# Patient Record
Sex: Male | Born: 1940
Health system: Southern US, Community
[De-identification: ages and names within clinical notes are randomized; demographics above are authoritative.]

## PROBLEM LIST (undated history)

## (undated) DIAGNOSIS — J189 Pneumonia, unspecified organism: Secondary | ICD-10-CM

## (undated) DIAGNOSIS — M1990 Unspecified osteoarthritis, unspecified site: Secondary | ICD-10-CM

## (undated) DIAGNOSIS — F419 Anxiety disorder, unspecified: Secondary | ICD-10-CM

## (undated) DIAGNOSIS — M7989 Other specified soft tissue disorders: Secondary | ICD-10-CM

## (undated) DIAGNOSIS — N400 Enlarged prostate without lower urinary tract symptoms: Secondary | ICD-10-CM

## (undated) DIAGNOSIS — H9190 Unspecified hearing loss, unspecified ear: Secondary | ICD-10-CM

## (undated) DIAGNOSIS — G44009 Cluster headache syndrome, unspecified, not intractable: Secondary | ICD-10-CM

## (undated) DIAGNOSIS — K2289 Other specified disease of esophagus: Secondary | ICD-10-CM

## (undated) DIAGNOSIS — K228 Other specified diseases of esophagus: Secondary | ICD-10-CM

## (undated) DIAGNOSIS — F329 Major depressive disorder, single episode, unspecified: Secondary | ICD-10-CM

## (undated) DIAGNOSIS — K529 Noninfective gastroenteritis and colitis, unspecified: Secondary | ICD-10-CM

## (undated) DIAGNOSIS — G629 Polyneuropathy, unspecified: Secondary | ICD-10-CM

## (undated) DIAGNOSIS — R351 Nocturia: Secondary | ICD-10-CM

## (undated) DIAGNOSIS — I2692 Saddle embolus of pulmonary artery without acute cor pulmonale: Secondary | ICD-10-CM

## (undated) DIAGNOSIS — I10 Essential (primary) hypertension: Secondary | ICD-10-CM

## (undated) DIAGNOSIS — F0390 Unspecified dementia without behavioral disturbance: Secondary | ICD-10-CM

## (undated) DIAGNOSIS — E785 Hyperlipidemia, unspecified: Secondary | ICD-10-CM

## (undated) DIAGNOSIS — G4700 Insomnia, unspecified: Secondary | ICD-10-CM

## (undated) DIAGNOSIS — F32A Depression, unspecified: Secondary | ICD-10-CM

## (undated) DIAGNOSIS — H269 Unspecified cataract: Secondary | ICD-10-CM

## (undated) DIAGNOSIS — M069 Rheumatoid arthritis, unspecified: Secondary | ICD-10-CM

## (undated) DIAGNOSIS — K219 Gastro-esophageal reflux disease without esophagitis: Secondary | ICD-10-CM

## (undated) DIAGNOSIS — G4730 Sleep apnea, unspecified: Secondary | ICD-10-CM

## (undated) DIAGNOSIS — K449 Diaphragmatic hernia without obstruction or gangrene: Secondary | ICD-10-CM

## (undated) DIAGNOSIS — R1310 Dysphagia, unspecified: Secondary | ICD-10-CM

## (undated) DIAGNOSIS — E0590 Thyrotoxicosis, unspecified without thyrotoxic crisis or storm: Secondary | ICD-10-CM

## (undated) DIAGNOSIS — R5383 Other fatigue: Secondary | ICD-10-CM

## (undated) HISTORY — DX: Other specified soft tissue disorders: M79.89

## (undated) HISTORY — DX: Anxiety disorder, unspecified: F41.9

## (undated) HISTORY — DX: Hyperlipidemia, unspecified: E78.5

## (undated) HISTORY — DX: Cluster headache syndrome, unspecified, not intractable: G44.009

## (undated) HISTORY — DX: Major depressive disorder, single episode, unspecified: F32.9

## (undated) HISTORY — DX: Benign prostatic hyperplasia without lower urinary tract symptoms: N40.0

## (undated) HISTORY — DX: Thyrotoxicosis, unspecified without thyrotoxic crisis or storm: E05.90

## (undated) HISTORY — DX: Gastro-esophageal reflux disease without esophagitis: K21.9

## (undated) HISTORY — DX: Unspecified hearing loss, unspecified ear: H91.90

## (undated) HISTORY — DX: Other fatigue: R53.83

## (undated) HISTORY — DX: Rheumatoid arthritis, unspecified: M06.9

## (undated) HISTORY — DX: Diaphragmatic hernia without obstruction or gangrene: K44.9

## (undated) HISTORY — DX: Essential (primary) hypertension: I10

## (undated) HISTORY — DX: Unspecified osteoarthritis, unspecified site: M19.90

## (undated) HISTORY — DX: Other specified disease of esophagus: K22.89

## (undated) HISTORY — PX: COLONOSCOPY WITH ESOPHAGOGASTRODUODENOSCOPY (EGD) AND ESOPHAGEAL DILATION (ED): SHX6495

## (undated) HISTORY — DX: Depression, unspecified: F32.A

## (undated) HISTORY — DX: Other specified diseases of esophagus: K22.8

---

## 1996-07-11 HISTORY — PX: BACK SURGERY: SHX140

## 1999-07-12 ENCOUNTER — Encounter: Payer: Self-pay | Admitting: Family Medicine

## 1999-08-17 ENCOUNTER — Encounter: Payer: Self-pay | Admitting: Internal Medicine

## 1999-08-17 ENCOUNTER — Ambulatory Visit (HOSPITAL_COMMUNITY): Admission: RE | Admit: 1999-08-17 | Discharge: 1999-08-17 | Payer: Self-pay | Admitting: Cardiology

## 2000-10-09 ENCOUNTER — Encounter: Payer: Self-pay | Admitting: Family Medicine

## 2000-10-09 LAB — CONVERTED CEMR LAB: Hgb A1c MFr Bld: 7.4 %

## 2001-01-22 ENCOUNTER — Encounter: Payer: Self-pay | Admitting: Emergency Medicine

## 2001-01-22 ENCOUNTER — Emergency Department (HOSPITAL_COMMUNITY): Admission: EM | Admit: 2001-01-22 | Discharge: 2001-01-22 | Payer: Self-pay | Admitting: Emergency Medicine

## 2001-02-08 ENCOUNTER — Encounter: Payer: Self-pay | Admitting: Family Medicine

## 2001-03-11 ENCOUNTER — Encounter: Payer: Self-pay | Admitting: Family Medicine

## 2001-10-09 ENCOUNTER — Encounter: Payer: Self-pay | Admitting: Family Medicine

## 2001-10-09 LAB — CONVERTED CEMR LAB
Hgb A1c MFr Bld: 5.9 %
PSA: 2.5 ng/mL

## 2001-12-20 ENCOUNTER — Encounter: Payer: Self-pay | Admitting: Internal Medicine

## 2001-12-20 ENCOUNTER — Ambulatory Visit (HOSPITAL_COMMUNITY): Admission: RE | Admit: 2001-12-20 | Discharge: 2001-12-20 | Payer: Self-pay | Admitting: Internal Medicine

## 2003-07-12 ENCOUNTER — Encounter: Payer: Self-pay | Admitting: Family Medicine

## 2003-07-12 LAB — CONVERTED CEMR LAB
Hgb A1c MFr Bld: 7.1 %
Hgb A1c MFr Bld: 7.1 %
PSA: 1.8 ng/mL
PSA: 1.8 ng/mL

## 2003-10-10 ENCOUNTER — Encounter: Payer: Self-pay | Admitting: Family Medicine

## 2003-10-10 LAB — CONVERTED CEMR LAB
Hgb A1c MFr Bld: 7 %
Hgb A1c MFr Bld: 7 %

## 2004-05-11 ENCOUNTER — Encounter: Payer: Self-pay | Admitting: Family Medicine

## 2004-05-11 LAB — CONVERTED CEMR LAB
Hgb A1c MFr Bld: 6.2 %
Hgb A1c MFr Bld: 6.2 %

## 2004-07-11 ENCOUNTER — Encounter: Payer: Self-pay | Admitting: Family Medicine

## 2004-07-11 LAB — CONVERTED CEMR LAB
Hgb A1c MFr Bld: 6.3 %
Hgb A1c MFr Bld: 6.3 %
Microalbumin U total vol: 3.8 mg/L
Microalbumin U total vol: 3.8 mg/L
PSA: 2.03 ng/mL
PSA: 2.03 ng/mL

## 2004-08-10 ENCOUNTER — Ambulatory Visit: Payer: Self-pay | Admitting: Family Medicine

## 2004-08-13 ENCOUNTER — Ambulatory Visit: Payer: Self-pay | Admitting: Family Medicine

## 2004-08-25 ENCOUNTER — Ambulatory Visit: Payer: Self-pay | Admitting: Internal Medicine

## 2004-08-26 ENCOUNTER — Ambulatory Visit: Payer: Self-pay | Admitting: Family Medicine

## 2004-08-30 ENCOUNTER — Ambulatory Visit: Payer: Self-pay | Admitting: Internal Medicine

## 2004-08-30 ENCOUNTER — Ambulatory Visit (HOSPITAL_COMMUNITY): Admission: RE | Admit: 2004-08-30 | Discharge: 2004-08-30 | Payer: Self-pay | Admitting: Internal Medicine

## 2004-10-01 ENCOUNTER — Ambulatory Visit: Payer: Self-pay | Admitting: Family Medicine

## 2004-11-24 ENCOUNTER — Ambulatory Visit: Payer: Self-pay | Admitting: Family Medicine

## 2004-12-30 ENCOUNTER — Ambulatory Visit: Payer: Self-pay | Admitting: Family Medicine

## 2005-04-01 ENCOUNTER — Ambulatory Visit: Payer: Self-pay | Admitting: Family Medicine

## 2005-05-06 ENCOUNTER — Ambulatory Visit: Payer: Self-pay | Admitting: Internal Medicine

## 2005-06-10 ENCOUNTER — Encounter: Payer: Self-pay | Admitting: Family Medicine

## 2005-06-10 LAB — CONVERTED CEMR LAB
Hgb A1c MFr Bld: 6.1 %
Hgb A1c MFr Bld: 6.1 %

## 2005-06-24 ENCOUNTER — Ambulatory Visit: Payer: Self-pay | Admitting: Family Medicine

## 2005-06-29 ENCOUNTER — Ambulatory Visit: Payer: Self-pay | Admitting: Family Medicine

## 2005-08-31 ENCOUNTER — Ambulatory Visit: Payer: Self-pay | Admitting: Family Medicine

## 2005-12-09 ENCOUNTER — Encounter: Payer: Self-pay | Admitting: Family Medicine

## 2005-12-09 LAB — CONVERTED CEMR LAB
Hgb A1c MFr Bld: 7 %
Hgb A1c MFr Bld: 7 %
Microalbumin U total vol: 4.4 mg/L
PSA: 2.1 ng/mL

## 2005-12-28 ENCOUNTER — Ambulatory Visit: Payer: Self-pay | Admitting: Family Medicine

## 2006-01-04 ENCOUNTER — Ambulatory Visit: Payer: Self-pay | Admitting: Family Medicine

## 2006-01-27 ENCOUNTER — Ambulatory Visit: Payer: Self-pay | Admitting: Family Medicine

## 2006-03-11 ENCOUNTER — Encounter: Payer: Self-pay | Admitting: Family Medicine

## 2006-04-05 ENCOUNTER — Ambulatory Visit: Payer: Self-pay | Admitting: Family Medicine

## 2006-04-05 LAB — CONVERTED CEMR LAB: Hgb A1c MFr Bld: 6.4 %

## 2006-04-07 ENCOUNTER — Ambulatory Visit: Payer: Self-pay | Admitting: Family Medicine

## 2006-05-16 ENCOUNTER — Ambulatory Visit: Payer: Self-pay | Admitting: Family Medicine

## 2006-06-10 ENCOUNTER — Encounter: Payer: Self-pay | Admitting: Family Medicine

## 2006-06-10 LAB — CONVERTED CEMR LAB: Hgb A1c MFr Bld: 6.6 %

## 2006-07-10 ENCOUNTER — Ambulatory Visit: Payer: Self-pay | Admitting: Family Medicine

## 2007-01-04 ENCOUNTER — Encounter: Payer: Self-pay | Admitting: Family Medicine

## 2007-01-04 DIAGNOSIS — K219 Gastro-esophageal reflux disease without esophagitis: Secondary | ICD-10-CM

## 2007-01-04 DIAGNOSIS — M1990 Unspecified osteoarthritis, unspecified site: Secondary | ICD-10-CM | POA: Insufficient documentation

## 2007-01-04 DIAGNOSIS — N400 Enlarged prostate without lower urinary tract symptoms: Secondary | ICD-10-CM | POA: Insufficient documentation

## 2007-01-04 DIAGNOSIS — E785 Hyperlipidemia, unspecified: Secondary | ICD-10-CM | POA: Insufficient documentation

## 2007-01-04 DIAGNOSIS — F528 Other sexual dysfunction not due to a substance or known physiological condition: Secondary | ICD-10-CM

## 2007-01-04 DIAGNOSIS — E119 Type 2 diabetes mellitus without complications: Secondary | ICD-10-CM | POA: Insufficient documentation

## 2007-01-04 DIAGNOSIS — F329 Major depressive disorder, single episode, unspecified: Secondary | ICD-10-CM

## 2007-01-04 DIAGNOSIS — I10 Essential (primary) hypertension: Secondary | ICD-10-CM | POA: Insufficient documentation

## 2007-01-04 DIAGNOSIS — E0590 Thyrotoxicosis, unspecified without thyrotoxic crisis or storm: Secondary | ICD-10-CM | POA: Insufficient documentation

## 2007-01-08 ENCOUNTER — Ambulatory Visit: Payer: Self-pay | Admitting: Family Medicine

## 2007-01-08 LAB — CONVERTED CEMR LAB
ALT: 30 units/L (ref 0–53)
Basophils Relative: 0.6 % (ref 0.0–1.0)
Bilirubin, Direct: 0.1 mg/dL (ref 0.0–0.3)
CO2: 30 meq/L (ref 19–32)
Calcium: 9.6 mg/dL (ref 8.4–10.5)
Creatinine,U: 96.6 mg/dL
Eosinophils Relative: 4.8 % (ref 0.0–5.0)
GFR calc Af Amer: 96 mL/min
Glucose, Bld: 130 mg/dL — ABNORMAL HIGH (ref 70–99)
Hemoglobin: 13.8 g/dL (ref 13.0–17.0)
Lymphocytes Relative: 33.2 % (ref 12.0–46.0)
Microalb Creat Ratio: 5.2 mg/g (ref 0.0–30.0)
Monocytes Absolute: 0.4 10*3/uL (ref 0.2–0.7)
Neutro Abs: 2.9 10*3/uL (ref 1.4–7.7)
Neutrophils Relative %: 53.3 % (ref 43.0–77.0)
Potassium: 4.6 meq/L (ref 3.5–5.1)
Sodium: 142 meq/L (ref 135–145)
Total Protein: 7.7 g/dL (ref 6.0–8.3)
WBC: 5.2 10*3/uL (ref 4.5–10.5)

## 2007-01-10 ENCOUNTER — Ambulatory Visit: Payer: Self-pay | Admitting: Family Medicine

## 2007-02-08 ENCOUNTER — Encounter (INDEPENDENT_AMBULATORY_CARE_PROVIDER_SITE_OTHER): Payer: Self-pay | Admitting: *Deleted

## 2007-02-08 ENCOUNTER — Ambulatory Visit: Payer: Self-pay | Admitting: Family Medicine

## 2007-05-04 ENCOUNTER — Ambulatory Visit: Payer: Self-pay | Admitting: Family Medicine

## 2007-07-17 ENCOUNTER — Ambulatory Visit: Payer: Self-pay | Admitting: Family Medicine

## 2007-07-17 LAB — CONVERTED CEMR LAB: Hgb A1c MFr Bld: 6.8 % — ABNORMAL HIGH (ref 4.6–6.0)

## 2007-07-19 ENCOUNTER — Ambulatory Visit: Payer: Self-pay | Admitting: Family Medicine

## 2007-07-19 DIAGNOSIS — R945 Abnormal results of liver function studies: Secondary | ICD-10-CM

## 2007-08-24 ENCOUNTER — Ambulatory Visit: Payer: Self-pay | Admitting: Family Medicine

## 2007-08-26 LAB — CONVERTED CEMR LAB
ALT: 29 units/L (ref 0–53)
AST: 31 units/L (ref 0–37)

## 2007-08-31 ENCOUNTER — Ambulatory Visit: Payer: Self-pay | Admitting: Family Medicine

## 2008-01-14 ENCOUNTER — Telehealth: Payer: Self-pay | Admitting: Family Medicine

## 2008-01-24 ENCOUNTER — Ambulatory Visit: Payer: Self-pay | Admitting: Family Medicine

## 2008-01-24 LAB — CONVERTED CEMR LAB
BUN: 18 mg/dL (ref 6–23)
Calcium: 9.7 mg/dL (ref 8.4–10.5)
Creatinine, Ser: 1.1 mg/dL (ref 0.4–1.5)
Eosinophils Relative: 4.4 % (ref 0.0–5.0)
GFR calc Af Amer: 86 mL/min
Glucose, Bld: 127 mg/dL — ABNORMAL HIGH (ref 70–99)
HCT: 41.4 % (ref 39.0–52.0)
Hemoglobin: 13.8 g/dL (ref 13.0–17.0)
Monocytes Absolute: 0.4 10*3/uL (ref 0.1–1.0)
Monocytes Relative: 9.2 % (ref 3.0–12.0)
Neutro Abs: 2.4 10*3/uL (ref 1.4–7.7)
PSA: 2.13 ng/mL (ref 0.10–4.00)
RDW: 15.1 % — ABNORMAL HIGH (ref 11.5–14.6)
TSH: 0.53 microintl units/mL (ref 0.35–5.50)
Total CHOL/HDL Ratio: 4.1
Triglycerides: 172 mg/dL — ABNORMAL HIGH (ref 0–149)
WBC: 4.5 10*3/uL (ref 4.5–10.5)

## 2008-01-28 ENCOUNTER — Ambulatory Visit: Payer: Self-pay | Admitting: Family Medicine

## 2008-03-27 ENCOUNTER — Ambulatory Visit: Payer: Self-pay | Admitting: Family Medicine

## 2008-03-27 LAB — CONVERTED CEMR LAB
OCCULT 2: NEGATIVE
OCCULT 3: NEGATIVE

## 2008-03-31 ENCOUNTER — Encounter (INDEPENDENT_AMBULATORY_CARE_PROVIDER_SITE_OTHER): Payer: Self-pay | Admitting: *Deleted

## 2008-05-08 ENCOUNTER — Ambulatory Visit: Payer: Self-pay | Admitting: Family Medicine

## 2008-05-10 ENCOUNTER — Encounter: Payer: Self-pay | Admitting: Family Medicine

## 2008-07-15 ENCOUNTER — Telehealth: Payer: Self-pay | Admitting: Family Medicine

## 2008-07-30 ENCOUNTER — Ambulatory Visit: Payer: Self-pay | Admitting: Family Medicine

## 2008-07-30 LAB — CONVERTED CEMR LAB
AST: 37 units/L (ref 0–37)
Albumin: 4.1 g/dL (ref 3.5–5.2)
Hgb A1c MFr Bld: 6.9 % — ABNORMAL HIGH (ref 4.6–6.0)

## 2008-08-04 ENCOUNTER — Ambulatory Visit: Payer: Self-pay | Admitting: Family Medicine

## 2008-08-14 ENCOUNTER — Telehealth: Payer: Self-pay | Admitting: Family Medicine

## 2009-01-13 ENCOUNTER — Telehealth: Payer: Self-pay | Admitting: Family Medicine

## 2009-01-29 ENCOUNTER — Ambulatory Visit: Payer: Self-pay | Admitting: Family Medicine

## 2009-01-29 LAB — CONVERTED CEMR LAB
Albumin: 3.9 g/dL (ref 3.5–5.2)
Alkaline Phosphatase: 49 units/L (ref 39–117)
Basophils Absolute: 0 10*3/uL (ref 0.0–0.1)
CO2: 30 meq/L (ref 19–32)
Calcium: 9.4 mg/dL (ref 8.4–10.5)
Chloride: 110 meq/L (ref 96–112)
Creatinine,U: 101.4 mg/dL
Eosinophils Absolute: 0.2 10*3/uL (ref 0.0–0.7)
Glucose, Bld: 126 mg/dL — ABNORMAL HIGH (ref 70–99)
HDL: 39.3 mg/dL (ref >39.00)
Hemoglobin: 13.2 g/dL (ref 13.0–17.0)
Lymphocytes Relative: 29.2 % (ref 12.0–46.0)
Lymphs Abs: 1.3 10*3/uL (ref 0.7–4.0)
MCHC: 32.9 g/dL (ref 30.0–36.0)
MCV: 93.4 fL (ref 78.0–100.0)
Magnesium: 2.3 mg/dL (ref 1.5–2.5)
Microalb, Ur: 0.9 mg/dL (ref 0.0–1.9)
Monocytes Absolute: 0.4 10*3/uL (ref 0.1–1.0)
Neutro Abs: 2.5 10*3/uL (ref 1.4–7.7)
RDW: 14.5 % (ref 11.5–14.6)
Sodium: 145 meq/L (ref 135–145)
TSH: 0.53 microintl units/mL (ref 0.35–5.50)
Total Protein: 7.4 g/dL (ref 6.0–8.3)
Triglycerides: 148 mg/dL (ref 0.0–149.0)

## 2009-01-30 LAB — CONVERTED CEMR LAB: Vit D, 25-Hydroxy: 14 ng/mL — ABNORMAL LOW (ref 30–89)

## 2009-02-03 ENCOUNTER — Ambulatory Visit: Payer: Self-pay | Admitting: Family Medicine

## 2009-02-03 DIAGNOSIS — E559 Vitamin D deficiency, unspecified: Secondary | ICD-10-CM | POA: Insufficient documentation

## 2009-02-18 ENCOUNTER — Ambulatory Visit: Payer: Self-pay | Admitting: Family Medicine

## 2009-02-18 ENCOUNTER — Encounter (INDEPENDENT_AMBULATORY_CARE_PROVIDER_SITE_OTHER): Payer: Self-pay | Admitting: *Deleted

## 2009-02-18 LAB — CONVERTED CEMR LAB
OCCULT 1: NEGATIVE
OCCULT 2: NEGATIVE
OCCULT 3: NEGATIVE

## 2009-05-01 ENCOUNTER — Ambulatory Visit: Payer: Self-pay | Admitting: Family Medicine

## 2009-05-03 LAB — CONVERTED CEMR LAB: Vit D, 25-Hydroxy: 35 ng/mL (ref 30–89)

## 2009-05-07 ENCOUNTER — Ambulatory Visit: Payer: Self-pay | Admitting: Family Medicine

## 2009-05-07 DIAGNOSIS — R519 Headache, unspecified: Secondary | ICD-10-CM | POA: Insufficient documentation

## 2009-05-07 DIAGNOSIS — R51 Headache: Secondary | ICD-10-CM

## 2009-07-15 ENCOUNTER — Telehealth: Payer: Self-pay | Admitting: Family Medicine

## 2009-07-29 ENCOUNTER — Ambulatory Visit: Payer: Self-pay | Admitting: Family Medicine

## 2009-07-29 LAB — CONVERTED CEMR LAB: Hgb A1c MFr Bld: 7.1 % — ABNORMAL HIGH (ref 4.6–6.5)

## 2009-08-05 ENCOUNTER — Ambulatory Visit: Payer: Self-pay | Admitting: Family Medicine

## 2009-11-03 ENCOUNTER — Ambulatory Visit: Payer: Self-pay | Admitting: Family Medicine

## 2009-11-05 ENCOUNTER — Ambulatory Visit: Payer: Self-pay | Admitting: Family Medicine

## 2009-12-12 ENCOUNTER — Encounter: Payer: Self-pay | Admitting: Family Medicine

## 2009-12-30 ENCOUNTER — Ambulatory Visit: Payer: Self-pay | Admitting: Family Medicine

## 2010-01-05 ENCOUNTER — Ambulatory Visit: Payer: Self-pay | Admitting: Family Medicine

## 2010-01-05 DIAGNOSIS — E1149 Type 2 diabetes mellitus with other diabetic neurological complication: Secondary | ICD-10-CM

## 2010-01-05 DIAGNOSIS — M25579 Pain in unspecified ankle and joints of unspecified foot: Secondary | ICD-10-CM | POA: Insufficient documentation

## 2010-01-12 ENCOUNTER — Telehealth: Payer: Self-pay | Admitting: Family Medicine

## 2010-01-17 ENCOUNTER — Encounter: Payer: Self-pay | Admitting: Family Medicine

## 2010-01-18 ENCOUNTER — Encounter: Payer: Self-pay | Admitting: Family Medicine

## 2010-01-18 ENCOUNTER — Telehealth: Payer: Self-pay | Admitting: Family Medicine

## 2010-01-26 ENCOUNTER — Telehealth: Payer: Self-pay | Admitting: Family Medicine

## 2010-01-28 ENCOUNTER — Encounter: Payer: Self-pay | Admitting: Family Medicine

## 2010-02-16 ENCOUNTER — Encounter (INDEPENDENT_AMBULATORY_CARE_PROVIDER_SITE_OTHER): Payer: Self-pay | Admitting: *Deleted

## 2010-08-12 NOTE — Letter (Signed)
Summary: [facility],Note  Naheed Kassam,[facility],Note   Imported By: Beau Fanny 12/14/2009 13:41:50  _____________________________________________________________________  External Attachment:    Type:   Image     Comment:   External Document  Appended Document: [facility],Note     Clinical Lists Changes  Observations: Added new observation of DMEYEEXAMNXT: 12/2010 (12/14/2009 18:18) Added new observation of MONOFILAM RT: normal (12/14/2009 18:18) Added new observation of MONOFILAM LF: normal (12/14/2009 18:18) Added new observation of DIAB FOOT CK: yes (12/14/2009 18:18) Added new observation of DMEYEEXMRES: normal (12/12/2009 18:20) Added new observation of EYE EXAM BY: Dr Alfonse Ras (12/12/2009 18:20) Added new observation of DIAB EYE EX: normal (12/12/2009 18:20)          Diabetes Management Exam:    Foot Exam (with socks and/or shoes not present):       Sensory-Pinprick/Light touch:          Left medial foot (L-4): normal          Left dorsal foot (L-5): normal          Left lateral foot (S-1): normal          Right medial foot (L-4): normal          Right dorsal foot (L-5): normal          Right lateral foot (S-1): normal       Sensory-Monofilament:          Left foot: normal          Right foot: normal       Inspection:          Left foot: normal          Right foot: normal       Nails:          Left foot: normal          Right foot: normal    Eye Exam:       Eye Exam done elsewhere          Date: 12/12/2009          Results: normal          Done by: Dr Alfonse Ras
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
## 2010-08-12 NOTE — Progress Notes (Signed)
Summary: Form for diabetic shoes  Phone Note Call from Patient Call back at Home Phone [phone]   Summary of Call: Pt has dropped off a form for diabetic shoes, this form is on your desk.  Please call pt when ready. Initial call taken by: Lowella Petties CMA,  January 18, 2010 11:42 AM  Follow-up for Phone Call        signed, thanks.  Follow-up by: Crawford Givens MD,  January 18, 2010 1:53 PM  Additional Follow-up for Phone Call Additional follow up Details #1::        Faxed Additional Follow-up by: Delilah Shan CMA (AAMA),  January 18, 2010 3:15 PM

## 2010-08-12 NOTE — Progress Notes (Signed)
Summary: Rx Alprazolam  Phone Note Refill Request Call back at [phone] Message from:  [facility]  Refills Requested: Medication #1:  XANAX 0.25 MG TABS 1 by mouth two times a day   Last Refilled: 12/08/2009 Patient has an appointment scheduled with Dr. Sharen Hones 04/08/10.   Method Requested: Telephone to Pharmacy Initial call taken by: Sydell Axon LPN,  January 12, 1190 8:15 AM  Follow-up for Phone Call        Rx called to pharmacy Follow-up by: Linde Gillis CMA Duncan Dull),  January 12, 2010 10:05 AM    Prescriptions: XANAX 0.25 MG TABS (ALPRAZOLAM) 1 by mouth two times a day  #60 x 1   Entered and Authorized by:   Ruthe Mannan MD   Signed by:   Ruthe Mannan MD on 01/12/2010   Method used:   Telephoned to ...       [facility] [address] (retail)       [address], [address]       [place], [place]  [address]       Ph: [phone]       Fax: [phone]   RxID:   [id number]

## 2010-08-12 NOTE — Assessment & Plan Note (Signed)
Summary: 3 M F/U DLO   Vital Signs:  Patient profile:   70 year old male Weight:      258 pounds Temp:     98.7 degrees F oral Pulse rate:   60 / minute Pulse rhythm:   regular BP sitting:   112 / 70  (left arm) Cuff size:   large  Vitals Entered By: Sydell Axon LPN (August 05, 2009 3:28 PM) CC: 3 Month follow-up   History of Present Illness: Pt here with his wife for followup[ of Diabetes and Vit D deficiaency. He has fallen off the wagon some with his sugar through the holidays and has been taking VIt D once a day. He has continued to have headaches but he only eats once a day in the eveniing and seems to trigger his headaches about that time.In addition, he love rice and eats a lot of that.  Problems Prior to Update: 1)  Headache  (ICD-784.0) 2)  Unspecified Vitamin D Deficiency  (ICD-268.9) 3)  Liver Function Tests, Abnormal  (ICD-794.8) 4)  Climacteric Arthritis Involving L Ankle and Foot  (ICD-716.37) 5)  Screening For Malignannt Neoplasm, Site Nec  (ICD-V76.49) 6)  Erectile Dysfunction  (ICD-302.72) 7)  Benign Prostatic Hypertrophy  (ICD-600.00) 8)  Osteoarthritis  (ICD-715.90) 9)  Hyperthyroidism  (ICD-242.90) 10)  Hypertension  (ICD-401.9) 11)  Hyperlipidemia  (ICD-272.4) 12)  Gerd  (ICD-530.81) 13)  Diabetes Mellitus, Type II  (ICD-250.00) 14)  Depression  (ICD-311)  Medications Prior to Update: 1)  Xanax 0.25 Mg Tabs (Alprazolam) .Marland Kitchen.. 1 By Mouth Two Times A Day 2)  Hydrochlorothiazide 12.5 Mg Caps (Hydrochlorothiazide) .Marland Kitchen.. 1 By Mouth Daily 3)  Glucophage 1000 Mg  Tabs (Metformin Hcl) .... One Tab By Mouth in Am, 1/ At Night 4)  Lipitor 10 Mg Tabs (Atorvastatin Calcium) .Marland Kitchen.. 1 By Mouth At Bedtime 5)  Lexapro 10 Mg Tabs (Escitalopram Oxalate) .Marland Kitchen.. 1 By Mouth Once Daily 6)  Klor-Con M10 10 Meq  Tbcr (Potassium Chloride Crys Cr) .Marland Kitchen.. 1 By Mouth Once Daily 7)  Amlodipine Besylate 5 Mg  Tabs (Amlodipine Besylate) .Marland Kitchen.. 1 By Mouth At Bedtime 8)  Glimepiride 4 Mg   Tabs (Glimepiride) .Marland Kitchen.. 1 By Mouth Every Am 9)  Lisinopril 2.5 Mg  Tabs (Lisinopril) .Marland Kitchen.. 1 By Mouth Daily 10)  Viagra 100 Mg  Tabs (Sildenafil Citrate) .... 1/2 Tab As Needed 11)  Prilosec 20 Mg  Cpdr (Omeprazole) .... One A Day 12)  1st Choice Lancets Super Thin   Misc (Lancets) .... As Directed 13)  Onetouch Lancets  Misc (Lancets) .... Use As Directed Daily 14)  Onetouch Test  Strp (Glucose Blood) .... Use As Directed 15)  Vitamin D (Ergocalciferol) 50000 Unit Caps (Ergocalciferol) .... One Tab By Mouth Once A Week  Allergies: No Known Drug Allergies  Physical Exam  General:  Well-developed,well-nourished,in no acute distress; alert,appropriate and cooperative throughout examination, obese. Nontoxic and seems unchanged from baseline. Head:  Normocephalic and atraumatic without obvious abnormalities. No apparent alopecia or balding. Eyes:  Conjunctiva clear bilaterally.  Ears:  External ear exam shows no significant lesions or deformities.  Otoscopic examination reveals clear canals, tympanic membranes are intact bilaterally without bulging, retraction, inflammation or discharge. Hearing is grossly normal bilaterally. Nose:  External nasal examination shows no deformity or inflammation. Nasal mucosa are pink and moist without lesions or exudates. Mouth:  Oral mucosa and oropharynx without lesions or exudates.  Teeth in good repair. Neck:  No deformities, masses, or tenderness noted. Chest Wall:  No deformities, masses, tenderness or gynecomastia noted. Lungs:  Normal respiratory effort, chest expands symmetrically. Lungs are clear to auscultation, no crackles or wheezes. Heart:  Normal rate and regular rhythm. S1 and S2 normal without gallop, murmur, click, rub or other extra sounds.   Impression & Recommendations:  Problem # 1:  HEADACHE (ICD-784.0) Assessment Unchanged Try to distribute intake of food to help not trigger headache from hunger. Will refer next time for neurology if  is able to spread out intake and yet still have them.  Problem # 2:  UNSPECIFIED VITAMIN D DEFICIENCY (ICD-268.9) Assessment: Improved But still deficient on once a day...increase to two times a day. Recheck in 3 months.  Problem # 3:  DIABETES MELLITUS, TYPE II (ICD-250.00) Assessment: Deteriorated HAs slipped. Had done sop well on his last assessment. Discussed at length avoiding eating once a day. Discussed food stuffs and limiting rice. Will recheck next visit. His updated medication list for this problem includes:    Glucophage 1000 Mg Tabs (Metformin hcl) ..... One tab by mouth in am, 1/ at night    Glimepiride 4 Mg Tabs (Glimepiride) .Marland Kitchen... 1 by mouth every am    Lisinopril 2.5 Mg Tabs (Lisinopril) .Marland Kitchen... 1 by mouth daily  Labs Reviewed: Creat: 1.0 (01/29/2009)   Microalbumin: 4.4 (12/09/2005)  Last Eye Exam: normal (05/10/2008) Reviewed HgBA1c results: 7.1 (07/29/2009)  6.7 (01/29/2009)  Problem # 4:  HYPERTENSION (ICD-401.9) Assessment: Unchanged Adequate, cont meds. His updated medication list for this problem includes:    Hydrochlorothiazide 12.5 Mg Caps (Hydrochlorothiazide) .Marland Kitchen... 1 by mouth daily    Amlodipine Besylate 5 Mg Tabs (Amlodipine besylate) .Marland Kitchen... 1 by mouth at bedtime    Lisinopril 2.5 Mg Tabs (Lisinopril) .Marland Kitchen... 1 by mouth daily  BP today: 112/70 Prior BP: 120/72 (05/07/2009)  Labs Reviewed: K+: 4.3 (01/29/2009) Creat: : 1.0 (01/29/2009)   Chol: 151 (01/29/2009)   HDL: 39.30 (01/29/2009)   LDL: 82 (01/29/2009)   TG: 148.0 (01/29/2009)  Complete Medication List: 1)  Xanax 0.25 Mg Tabs (Alprazolam) .Marland Kitchen.. 1 by mouth two times a day 2)  Hydrochlorothiazide 12.5 Mg Caps (Hydrochlorothiazide) .Marland Kitchen.. 1 by mouth daily 3)  Glucophage 1000 Mg Tabs (Metformin hcl) .... One tab by mouth in am, 1/ at night 4)  Lipitor 10 Mg Tabs (Atorvastatin calcium) .Marland Kitchen.. 1 by mouth at bedtime 5)  Lexapro 10 Mg Tabs (Escitalopram oxalate) .Marland Kitchen.. 1 by mouth once daily 6)  Klor-con M10 10  Meq Tbcr (Potassium chloride crys cr) .Marland Kitchen.. 1 by mouth once daily 7)  Amlodipine Besylate 5 Mg Tabs (Amlodipine besylate) .Marland Kitchen.. 1 by mouth at bedtime 8)  Glimepiride 4 Mg Tabs (Glimepiride) .Marland Kitchen.. 1 by mouth every am 9)  Lisinopril 2.5 Mg Tabs (Lisinopril) .Marland Kitchen.. 1 by mouth daily 10)  Viagra 100 Mg Tabs (Sildenafil citrate) .... 1/2 tab as needed 11)  Prilosec 20 Mg Cpdr (Omeprazole) .... One a day 12)  1st Choice Lancets Super Thin Misc (Lancets) .... As directed 13)  Onetouch Lancets Misc (Lancets) .... Use as directed daily 14)  Onetouch Test Strp (Glucose blood) .... Use as directed 15)  Vitamin D 1000 Unit Tabs (Cholecalciferol) .... Take one by mouth daily  Patient Instructions: 1)  RTC 3 mos, A1C 250.00 and Vit D 268.9 prior  Current Allergies (reviewed today): No known allergies

## 2010-08-12 NOTE — Medication Information (Signed)
Summary: [facility]  [facility]   Imported By: Lanelle Bal 02/03/2010 08:56:43  _____________________________________________________________________  External Attachment:    Type:   Image     Comment:   External Document

## 2010-08-12 NOTE — Assessment & Plan Note (Signed)
Summary: follow up   Vital Signs:  Patient profile:   70 year old male Height:      70 inches Weight:      258 pounds BMI:     37.15 Temp:     98.5 degrees F oral Pulse rate:   64 / minute Pulse rhythm:   regular BP sitting:   112 / 80  (right arm) Cuff size:   large  Vitals Entered By: Linde Gillis CMA Duncan Dull) (November 05, 2009 4:05 PM) CC: follow-up visit   History of Present Illness: Pt here with his wife for followup of Vit D therapy. He is taking OTC Vit D 1000Iu two times a day and tolerating well. He is also here for followup of his DM. He is still eating only one meal a day and typically at 11AM. He waits until 9 or later to check his sugar and doesn't want to eat before he checks his sugar. He does not know why he waits until 9AM.  He also would like to request refill of his Viagra.  He often has a headache which he notices goes away when he eats.  Problems Prior to Update: 1)  Headache  (ICD-784.0) 2)  Unspecified Vitamin D Deficiency  (ICD-268.9) 3)  Liver Function Tests, Abnormal  (ICD-794.8) 4)  Climacteric Arthritis Involving L Ankle and Foot  (ICD-716.37) 5)  Screening For Malignannt Neoplasm, Site Nec  (ICD-V76.49) 6)  Erectile Dysfunction  (ICD-302.72) 7)  Benign Prostatic Hypertrophy  (ICD-600.00) 8)  Osteoarthritis  (ICD-715.90) 9)  Hyperthyroidism  (ICD-242.90) 10)  Hypertension  (ICD-401.9) 11)  Hyperlipidemia  (ICD-272.4) 12)  Gerd  (ICD-530.81) 13)  Diabetes Mellitus, Type II  (ICD-250.00) 14)  Depression  (ICD-311)  Medications Prior to Update: 1)  Xanax 0.25 Mg Tabs (Alprazolam) .Marland Kitchen.. 1 By Mouth Two Times A Day 2)  Hydrochlorothiazide 12.5 Mg Caps (Hydrochlorothiazide) .Marland Kitchen.. 1 By Mouth Daily 3)  Glucophage 1000 Mg  Tabs (Metformin Hcl) .... One Tab By Mouth in Am, 1/ At Night 4)  Lipitor 10 Mg Tabs (Atorvastatin Calcium) .Marland Kitchen.. 1 By Mouth At Bedtime 5)  Lexapro 10 Mg Tabs (Escitalopram Oxalate) .Marland Kitchen.. 1 By Mouth Once Daily 6)  Klor-Con M10 10 Meq  Tbcr  (Potassium Chloride Crys Cr) .Marland Kitchen.. 1 By Mouth Once Daily 7)  Amlodipine Besylate 5 Mg  Tabs (Amlodipine Besylate) .Marland Kitchen.. 1 By Mouth At Bedtime 8)  Glimepiride 4 Mg  Tabs (Glimepiride) .Marland Kitchen.. 1 By Mouth Every Am 9)  Lisinopril 2.5 Mg  Tabs (Lisinopril) .Marland Kitchen.. 1 By Mouth Daily 10)  Viagra 100 Mg  Tabs (Sildenafil Citrate) .... 1/2 Tab As Needed 11)  Prilosec 20 Mg  Cpdr (Omeprazole) .... One A Day 12)  1st Choice Lancets Super Thin   Misc (Lancets) .... As Directed 13)  Onetouch Lancets  Misc (Lancets) .... Use As Directed Daily 14)  Onetouch Test  Strp (Glucose Blood) .... Use As Directed 15)  Vitamin D 1000 Unit Tabs (Cholecalciferol) .... Take One By Mouth Daily  Allergies (verified): No Known Drug Allergies  Physical Exam  General:  Well-developed,well-nourished,in no acute distress; alert,appropriate and cooperative throughout examination, obese. Nontoxic and seems unchanged from baseline. Head:  Normocephalic and atraumatic without obvious abnormalities. No apparent alopecia or balding. Eyes:  Conjunctiva clear bilaterally.  Ears:  External ear exam shows no significant lesions or deformities.  Otoscopic examination reveals clear canals, tympanic membranes are intact bilaterally without bulging, retraction, inflammation or discharge. Hearing is grossly normal bilaterally. Nose:  External nasal examination  shows no deformity or inflammation. Nasal mucosa are pink and moist without lesions or exudates. Mouth:  Oral mucosa and oropharynx without lesions or exudates.  Teeth in good repair. Neck:  No deformities, masses, or tenderness noted. Lungs:  Normal respiratory effort, chest expands symmetrically. Lungs are clear to auscultation, no crackles or wheezes. Heart:  Normal rate and regular rhythm. S1 and S2 normal without gallop, murmur, click, rub or other extra sounds.   Impression & Recommendations:  Problem # 1:  UNSPECIFIED VITAMIN D DEFICIENCY (ICD-268.9) Assessment Improved Replaced  to therapeutic. Cont two times a day of 1000Iu.  Problem # 2:  DIABETES MELLITUS, TYPE II (ICD-250.00) Assessment: Unchanged A1C still 7.3. Increase Metformin to 1 in AM 11/2 at night. Again discussed at length spreading out small meals over 6 intakes a day without increasing overall amount eaten. His updated medication list for this problem includes:    Glucophage 1000 Mg Tabs (Metformin hcl) ..... One tab by mouth in am, 1/ at night    Glimepiride 4 Mg Tabs (Glimepiride) .Marland Kitchen... 1 by mouth every am    Lisinopril 2.5 Mg Tabs (Lisinopril) .Marland Kitchen... 1 by mouth daily  Labs Reviewed: Creat: 1.0 (01/29/2009)   Microalbumin: 4.4 (12/09/2005)  Last Eye Exam: normal (05/10/2008) Reviewed HgBA1c results: 7.3 (11/03/2009)  7.1 (07/29/2009)  Problem # 3:  HEADACHE (ICD-784.0) Assessment: Unchanged Probably relative hypoglycemia. I think these will go away if spreads out intakes.  Problem # 4:  ERECTILE DYSFUNCTION (ICD-302.72) Assessment: Unchanged  Viagra works. Would like prescription refill. His updated medication list for this problem includes:    Viagra 100 Mg Tabs (Sildenafil citrate) .Marland Kitchen... 1/2 tab as needed  Discussed proper use of medications, as well as side effects.   Complete Medication List: 1)  Xanax 0.25 Mg Tabs (Alprazolam) .Marland Kitchen.. 1 by mouth two times a day 2)  Hydrochlorothiazide 12.5 Mg Caps (Hydrochlorothiazide) .Marland Kitchen.. 1 by mouth daily 3)  Glucophage 1000 Mg Tabs (Metformin hcl) .... One tab by mouth in am, 1/ at night 4)  Lipitor 10 Mg Tabs (Atorvastatin calcium) .Marland Kitchen.. 1 by mouth at bedtime 5)  Lexapro 10 Mg Tabs (Escitalopram oxalate) .Marland Kitchen.. 1 by mouth once daily 6)  Klor-con M10 10 Meq Tbcr (Potassium chloride crys cr) .Marland Kitchen.. 1 by mouth once daily 7)  Amlodipine Besylate 5 Mg Tabs (Amlodipine besylate) .Marland Kitchen.. 1 by mouth at bedtime 8)  Glimepiride 4 Mg Tabs (Glimepiride) .Marland Kitchen.. 1 by mouth every am 9)  Lisinopril 2.5 Mg Tabs (Lisinopril) .Marland Kitchen.. 1 by mouth daily 10)  Viagra 100 Mg Tabs  (Sildenafil citrate) .... 1/2 tab as needed 11)  Prilosec 20 Mg Cpdr (Omeprazole) .... One a day 12)  1st Choice Lancets Super Thin Misc (Lancets) .... As directed 13)  Onetouch Lancets Misc (Lancets) .... Use as directed daily 14)  Onetouch Test Strp (Glucose blood) .... Use as directed 15)  Vitamin D 1000 Unit Tabs (Cholecalciferol) .... Take two by mouth daily  Patient Instructions: 1)  RTC late Jun for follow up, A1C prior Prescriptions: VIAGRA 100 MG  TABS (SILDENAFIL CITRATE) 1/2 tab as needed  #10 Each x 6   Entered and Authorized by:   Shaune Leeks MD   Signed by:   Shaune Leeks MD on 11/05/2009   Method used:   Electronically to        [facility] [address] (retail)       [address]       [place]  [place], [place]  [address]       Ph: [phone]       Fax: [phone]   RxID:   [id number]   Current Allergies (reviewed today): No known allergies

## 2010-08-12 NOTE — Medication Information (Signed)
Summary: Diabetic Shoes  Diabetic Shoes   Imported By: Lanelle Bal 01/21/2010 11:04:11  _____________________________________________________________________  External Attachment:    Type:   Image     Comment:   External Document

## 2010-08-12 NOTE — Progress Notes (Signed)
Summary: Xanax  Phone Note Refill Request Message from:  [employer] on July 15, 2009 8:56 AM  Refills Requested: Medication #1:  XANAX 0.25 MG TABS 1 by mouth two times a day [facility] [address]   Last Fill Date:  06/12/2009   Pharmacy Phone:  [phone]   Method Requested: Telephone to Pharmacy Initial call taken by: Delilah Shan CMA Duncan Dull),  July 15, 2009 8:56 AM  Follow-up for Phone Call        Rx called to pharmacy Follow-up by: Linde Gillis CMA Duncan Dull),  July 15, 2009 11:07 AM    Prescriptions: Prudy Feeler 0.25 MG TABS (ALPRAZOLAM) 1 by mouth two times a day  #60 x 5   Entered and Authorized by:   Shaune Leeks MD   Signed by:   Shaune Leeks MD on 07/15/2009   Method used:   Telephoned to ...       [facility] [address] (retail)       [address]       [place], [place]  [address]       Ph: [phone]       Fax: [phone]   RxID:   [id number]

## 2010-08-12 NOTE — Letter (Signed)
Summary: Andrew Winters letter  [place] at [facility]  [address] [place], [place] [address]   Phone: [phone]  Fax: [phone]       02/16/2010 MRN: [id number]  Andrew Winters [address] [place], [place]  [address]  Dear [name],  [facility] [place], and [facility] announce the retirement of Arta Silence, M.D., from full-time practice at the [facility] office effective January 07, 2010 and his plans of returning part-time.  It is important to Dr. Hetty Ely and to our practice that you understand that [facility] has seven physicians in our office for your health care needs.  We will continue to offer the same exceptional care that you have today.    Dr. Hetty Ely has spoken to many of you about his plans for retirement and returning part-time in the fall.   We will continue to work with you through the transition to schedule appointments for you in the office and meet the high standards that [facility] is committed to.   Again, it is with great pleasure that we share the news that Dr. Hetty Ely will return to [facility] in October of 2011 with a reduced schedule.    If you have any questions, or would like to request an appointment with one of our physicians, please call us at [phone] and press the option for Scheduling an appointment.  We take pleasure in providing you with excellent patient care and look forward to seeing you at your next office visit.  Our [facility] Physicians are:  Tillman Abide, M.D. Laurita Quint, M.D. Roxy Manns, M.D. Kerby Nora, M.D. Hannah Beat, M.D. Ruthe Mannan, M.D. We proudly welcomed Raechel Ache, M.D. and Eustaquio Boyden, M.D. to the practice in July/August 2011.  Sincerely,  [facility]

## 2010-08-12 NOTE — Progress Notes (Signed)
Summary: form for diabetic supplies  Phone Note From Pharmacy   Caller: [facility] Summary of Call: Form form diabetic supplies is on your desk. Initial call taken by: Lowella Petties CMA,  January 26, 2010 5:06 PM  Follow-up for Phone Call        signed, please send back Follow-up by: Crawford Givens MD,  January 27, 2010 11:42 AM  Additional Follow-up for Phone Call Additional follow up Details #1::        Faxed. Additional Follow-up by: Delilah Shan CMA Brallan Denio Dull),  January 27, 2010 4:32 PM

## 2010-08-12 NOTE — Medication Information (Signed)
Summary: Diabetes Supplies/[facility]  Diabetes Supplies/[facility]   Imported By: Lanelle Bal 01/21/2010 08:10:36  _____________________________________________________________________  External Attachment:    Type:   Image     Comment:   External Document

## 2010-11-26 NOTE — Procedures (Signed)
[facility]  Patient:    DEMARQUS, JOCSON Visit Number: [id number] MRN: [id number]          Service Type: END Location: ENDO Attending Physician:  Mervin Hack Dictated by:   Hedwig Morton. Juanda Chance, M.D. [facility] Admit Date:  12/20/2001 Discharge Date: 12/20/2001   CC:         Laurita Quint, M.D. [facility]   Procedure Report  PROCEDURE:  Upper endoscopy and esophageal dilation.  INDICATIONS:  This 70 year old gentleman has a history of solid food dysphagia.  He underwent esophageal dilatation in February of 2001 using Savary dilators with complete relief of his dysphagia for two years, but no definite stricture was seen at that time.  The esophagus was noted to be tortuous with some eccentric lumen.  He is now again developing solid food dysphagia, odynophagia, and heartburn.  He is undergoing upper endoscopy and esophageal dilation.  ENDOSCOPE:  Olympus single-channel video endoscope.  SEDATION:  Versed 5 mg IV and Demerol 60 mg IV.  FINDINGS:  The Olympus single-channel video endoscope was passed under direct vision through the posterior pharynx into the esophagus.  The patient was monitored by pulse oximeter.  Oxygen saturations were normal.  He was very cooperative.  The vallecula and piriform sinuses were normal.  The proximal esophageal mucosa was unremarkable.  The entire esophagus was mildly dilated with a few ______ contractions ______ through the body of the esophagus. The lumen distally was eccentric and closed.  Pressure had to be exerted at the area of the lower esophageal sphincter at 44 cm from the incisors.  As the pressure was exerted and the lumen opened up, we were able to negotiate through the ______ without difficulty.  The squamocolumnar junction itself appeared normal and there was only a mild muscular ring which was nonconstricting once the lumen opened, but when closed up there was some spasm and resistance to open it  up.  Stomach:  The stomach was insufflated and showed normal gastric mucosa, antrum, and pylorus.  Retroflexion of the endoscope revealed normal fundus and cardia.  Duodenum:  The duodenum, duodenal bulb, and descending duodenum were normal.  A guide wire was then placed into the stomach and Savary dilators passed over the guide wire under fluoroscopic guidance using 16, 17, 18, and 19 mm dilators.  There was no blood on the dilator.  There was some resistance to the last dilator.  The patient tolerated the procedure well.  IMPRESSION: 1. Status post esophageal dilation to 19 mm. 2. Dysphagia, but no evidence of esophageal stricture. 3. Suspected motility disorder of the esophagus with lower esophageal    sphincter dysfunction.  PLAN:  The patient will continue on his acid-suppressing agents and see whether his dysphagia has improved.  If it is not improved, we will proceed with esophagogram or esophageal manometry studies to further assess his motility. Dictated by:   Hedwig Morton. Juanda Chance, M.D. [facility] Attending Physician:  Mervin Hack DD:  12/20/01 TD:  12/22/01 Job: [id number]

## 2011-01-09 ENCOUNTER — Other Ambulatory Visit: Payer: Self-pay | Admitting: Family Medicine

## 2011-01-10 ENCOUNTER — Telehealth: Payer: Self-pay | Admitting: Internal Medicine

## 2011-01-10 NOTE — Telephone Encounter (Signed)
Spoke with patient's wife. Patient has been having trouble swallowing again. Started about 2 months ago. He saw his PCP and was told to see Dr. Juanda Chance. Patient is taking Prilosec OTC BID. Scheduled patient on 01/13/11 at 9:30 AM with Dr. Juanda Chance. Last EGD- 08/30/2004- distal esophagus stricture/stenosis- dilated.

## 2011-01-11 ENCOUNTER — Encounter: Payer: Self-pay | Admitting: *Deleted

## 2011-01-13 ENCOUNTER — Encounter: Payer: Self-pay | Admitting: Internal Medicine

## 2011-01-13 ENCOUNTER — Ambulatory Visit (INDEPENDENT_AMBULATORY_CARE_PROVIDER_SITE_OTHER): Payer: Medicare Other | Admitting: Internal Medicine

## 2011-01-13 VITALS — BP 136/74 | HR 62 | Ht 73.0 in | Wt 252.0 lb

## 2011-01-13 DIAGNOSIS — K222 Esophageal obstruction: Secondary | ICD-10-CM

## 2011-01-13 DIAGNOSIS — R1310 Dysphagia, unspecified: Secondary | ICD-10-CM

## 2011-01-13 NOTE — Progress Notes (Signed)
Andrew Winters 1941/02/13 MRN [id number]     History of Present Illness:  This is a 70 year old African American male with severe degenerative joint disease, diabetes and hypertension. He has increasing dysphagia to pills, solids as well as to liquids for several months. He has a history of an esophageal stricture found on an upper endoscopy in 1996, 2001 and June 2003 and was dilated under fluoroscopy. A repeat dilation took place in February 2006 when we found a muscular ring in the distal esophagus. He was dilated with 18,19 and 20 mm dilators. He responded with complete resolution of his dysphagia for 6 years. He is now having chest pain localized in the subxiphoid area anteriorly, which occurs not only with eating but also independent of eating. There has been no weight loss. He has been on omeprazole 20 mg daily.  Past Medical History  Diagnosis Date  . Diabetes mellitus   . Bilateral swelling of feet   . Reflux   . Headaches, cluster   . Depression   . Anxiety   . Fatigue   . Hypertension   . Chest pain   . Rheumatoid arthritis   . Muscle pain   . Hiatal hernia   . Vitamin D deficiency   . ED (erectile dysfunction)   . BPH (benign prostatic hyperplasia)   . Osteoarthritis   . Hyperthyroidism   . GERD (gastroesophageal reflux disease)   . Deafness     in left ear   . Hyperlipemia    Past Surgical History  Procedure Date  . Back surgery 1998    reports that he has been smoking Cigarettes.  He has a 35 pack-year smoking history. He has never used smokeless tobacco. He reports that he does not drink alcohol or use illicit drugs. family history includes Heart attack in his father and Heart disease in his mother.  There is no history of Colon cancer. Allergies  Allergen Reactions  . Aspirin         Review of Systems: Negative for lower abdominal pain or change in bowel habits  The remainder of the 10  point ROS is negative except as outlined in H&P   Physical  Exam: General appearance  Well developed, in no distress. Eyes- non icteric. HEENT nontraumatic, normocephalic. Mouth no lesions, tongue papillated, no cheilosis. Neck supple without adenopathy, thyroid not enlarged, no carotid bruits, no JVD. Lungs Clear to auscultation bilaterally. Cor normal S1 normal S2, regular rhythm , no murmur,  quiet precordium. Abdomen  Rectal: Extremities no pedal edema. Skin no lesions. Neurological alert and oriented x 3. Psychological normal mood and affect.  Assessment and Plan:  Problem #1 Recurrent solid food dysphagia. At this time, patient also has dysphagia to liquids, pills and complaints of chest pain indicating either spasm or esophageal dysmotility problem. Since he has responded favorably to each of his 3 dilatations in the past, we will proceed with esophageal dilation. If he has no improvement with dilatation, I would consider a barium esophagram to assess his esopahgeal motility. He will continue omeprazole 20 mg daily.   01/13/2011 Andrew Winters

## 2011-01-13 NOTE — Patient Instructions (Addendum)
You have been scheduled for an endoscopy. Please follow written instructions given to you at your visit today. CC:Dr Etta Quill

## 2011-01-19 ENCOUNTER — Ambulatory Visit (AMBULATORY_SURGERY_CENTER): Payer: Medicare Other | Admitting: Internal Medicine

## 2011-01-19 ENCOUNTER — Encounter: Payer: Self-pay | Admitting: Internal Medicine

## 2011-01-19 VITALS — BP 141/65 | HR 82 | Temp 98.0°F | Resp 14 | Ht 73.0 in | Wt 250.0 lb

## 2011-01-19 DIAGNOSIS — R1310 Dysphagia, unspecified: Secondary | ICD-10-CM

## 2011-01-19 DIAGNOSIS — K228 Other specified diseases of esophagus: Secondary | ICD-10-CM

## 2011-01-19 DIAGNOSIS — K3184 Gastroparesis: Secondary | ICD-10-CM

## 2011-01-19 DIAGNOSIS — R1319 Other dysphagia: Secondary | ICD-10-CM

## 2011-01-19 LAB — GLUCOSE, CAPILLARY: Glucose-Capillary: 113 mg/dL — ABNORMAL HIGH (ref 70–99)

## 2011-01-19 MED ORDER — SODIUM CHLORIDE 0.9 % IV SOLN: 500.0000 mL | INTRAVENOUS | Status: DC

## 2011-01-19 NOTE — Patient Instructions (Signed)
Discharge instructions given with verbal understanding. Handout on dilatation diet. Resume previous medications.

## 2011-01-20 ENCOUNTER — Telehealth: Payer: Self-pay

## 2011-01-20 NOTE — Telephone Encounter (Signed)
Follow up Call- Patient questions:  Do you have a fever, pain , or abdominal swelling?  Pain Score    Have you tolerated food without any problems?   Have you been able to return to your normal activities?   Do you have any questions about your discharge instructions: Diet   no Medications  no Follow up visit  no  Do you have questions or concerns about your Care? no  Actions: * If pain score is 4 or above: No action needed, pain <4.

## 2011-02-10 ENCOUNTER — Other Ambulatory Visit: Payer: Self-pay | Admitting: Family Medicine

## 2011-02-11 ENCOUNTER — Other Ambulatory Visit: Payer: Self-pay | Admitting: *Deleted

## 2011-05-30 ENCOUNTER — Encounter: Payer: Self-pay | Admitting: *Deleted

## 2011-06-08 ENCOUNTER — Ambulatory Visit (INDEPENDENT_AMBULATORY_CARE_PROVIDER_SITE_OTHER): Payer: Medicare Other | Admitting: Internal Medicine

## 2011-06-08 ENCOUNTER — Encounter: Payer: Self-pay | Admitting: Internal Medicine

## 2011-06-08 DIAGNOSIS — R1310 Dysphagia, unspecified: Secondary | ICD-10-CM

## 2011-06-08 NOTE — Patient Instructions (Signed)
You have been scheduled for a Barium Esophogram at [facility] (1st floor of the hospital) on 06/14/11 at 9:30 am. Please arrive 15 minutes prior to your appointment for registration. Make certain not to have anything to eat or drink 6 hours prior to your test. If you need to reschedule for any reason, please contact radiology at [phone] to do so. We will contact you regarding an appointment with Speech Pathology We have given you a dysphagia diet to follow. CC: Dr Domenic Schwab

## 2011-06-08 NOTE — Progress Notes (Signed)
Andrew Winters April 12, 1941 MRN [id number]    History of Present Illness:  This is a 70 year old white male with mid dysphagia to solids and liquids. He has had numerous upper endoscopies for esophageal stricture in 1996, 2001, 2003, 2006 and in July 2012. On the last endoscopy, he was noted to have presbyesophagus with spasm of the lower esophageal sphincter but no evidence of stricture. He was dilated with 18 and 19 mm dilators. He comes today with his wife complaining he is unable to swallow pills and chokes on food. He denies any odynophagia. He has been a long-standing diabetic on oral hypoglycemic agents.   Past Medical History  Diagnosis Date  . Diabetes mellitus   . Bilateral swelling of feet   . Reflux   . Headaches, cluster   . Depression   . Anxiety   . Fatigue   . Hypertension   . Chest pain   . Rheumatoid arthritis   . Muscle pain   . Hiatal hernia   . Vitamin D deficiency   . ED (erectile dysfunction)   . BPH (benign prostatic hyperplasia)   . Osteoarthritis   . Hyperthyroidism   . GERD (gastroesophageal reflux disease)   . Deafness     in left ear   . Hyperlipemia   . Presbyesophagus    Past Surgical History  Procedure Date  . Back surgery 1998    reports that he has been smoking Cigarettes.  He has a 35 pack-year smoking history. He has never used smokeless tobacco. He reports that he does not drink alcohol or use illicit drugs. family history includes Heart attack in his father and Heart disease in his mother.  There is no history of Colon cancer. Allergies  Allergen Reactions  . Aspirin         Review of Systems:  The remainder of the 10 point ROS is negative except as outlined in H&P   Physical Exam: General appearance  Well developed, in no distress. Eyes- non icteric. HEENT nontraumatic, normocephalic. Mouth no lesions, tongue papillated, no cheilosis. Neck supple without adenopathy, thyroid not enlarged, no carotid bruits, no JVD. Lungs  Clear to auscultation bilaterally. Cor normal S1, normal S2, regular rhythm, no murmur,  quiet precordium. Abdomen: Nontender Rectal: Not done Extremities no pedal edema. Skin no lesions. Neurological alert and oriented x 3. Psychological normal mood and affect.  Assessment and Plan:  Problem #1 Progressive dysphagia to include dysphagia to liquids as well as solids which is consistent with presbyesophagus. He will not benefit from repeat dilatation. We will assess his motility by proceeding with a barium esophagram. I have given him instructions on dysphagia diet and depending on the results of his barium swallow, he may need a appointment with a nutritionist or speech pathologist to decide the best diet for him. I would consider using Reglan. He will continue taking his acid suppressant agents.   06/08/2011 Andrew Winters

## 2011-06-14 ENCOUNTER — Ambulatory Visit (HOSPITAL_COMMUNITY)
Admission: RE | Admit: 2011-06-14 | Discharge: 2011-06-14 | Disposition: A | Discharge status: Home / Self Care | Payer: Medicare Other | Source: Ambulatory Visit | Attending: Internal Medicine | Admitting: Internal Medicine

## 2011-06-14 DIAGNOSIS — K219 Gastro-esophageal reflux disease without esophagitis: Secondary | ICD-10-CM | POA: Insufficient documentation

## 2011-06-14 DIAGNOSIS — R1310 Dysphagia, unspecified: Secondary | ICD-10-CM | POA: Insufficient documentation

## 2011-06-14 DIAGNOSIS — K224 Dyskinesia of esophagus: Secondary | ICD-10-CM | POA: Insufficient documentation

## 2013-04-18 ENCOUNTER — Ambulatory Visit (INDEPENDENT_AMBULATORY_CARE_PROVIDER_SITE_OTHER): Payer: Medicare Other

## 2013-04-18 ENCOUNTER — Encounter (INDEPENDENT_AMBULATORY_CARE_PROVIDER_SITE_OTHER): Payer: Self-pay

## 2013-04-18 VITALS — BP 122/81 | HR 83 | Resp 16 | Ht 73.0 in | Wt 250.0 lb

## 2013-04-18 DIAGNOSIS — E1142 Type 2 diabetes mellitus with diabetic polyneuropathy: Secondary | ICD-10-CM

## 2013-04-18 DIAGNOSIS — L608 Other nail disorders: Secondary | ICD-10-CM

## 2013-04-18 DIAGNOSIS — E1149 Type 2 diabetes mellitus with other diabetic neurological complication: Secondary | ICD-10-CM

## 2013-04-18 NOTE — Progress Notes (Signed)
  Subjective:    Patient ID: Andrew Winters, male    DOB: 26-Feb-1941, 72 y.o.   MRN: [id number]  HPI patient presents for followup diabetic foot and nail care no changes in health or medications status at this time. Nails thick brittle crumbly dystrophic ingrowing and tender on palpation. Continues to have mild to moderate hammertoe deformity 2 through 5 bilateral with adductovarus rotation.    Review of Systems  Constitutional: Negative.   HENT: Positive for hearing loss.   Eyes: Negative.   Respiratory: Negative.   Cardiovascular: Negative.   Gastrointestinal: Negative.   Endocrine: Negative.   Allergic/Immunologic: Negative.   Neurological: Negative.   Psychiatric/Behavioral: The patient is nervous/anxious.        Objective:   Physical Exam vascular status reveals pedal pulses DP plus for PT nonpalpable bilateral. Epicritic and proprioceptive sensations intact although diminished at distal digits and forefoot bilateral. Nails friable discolored brittle tender on palpation and shoe wear. No secondary keratoses no open wounds or ulcerations noted. No complaints of pain or discomfort.        Assessment & Plan:  Diabetes with peripheral neuropathy. Dystrophic nails x10. Nails debrided in the presence of diabetes and complicating factors at this time. Return for followup in 3 months. Maintain appropriate and an accommodative shoe wear it at all times.  Alvan Dame DPM

## 2013-04-18 NOTE — Patient Instructions (Addendum)

## 2013-07-18 ENCOUNTER — Ambulatory Visit: Payer: Medicare Other

## 2013-07-24 ENCOUNTER — Ambulatory Visit: Payer: Medicare Other

## 2013-08-07 ENCOUNTER — Ambulatory Visit (INDEPENDENT_AMBULATORY_CARE_PROVIDER_SITE_OTHER): Payer: Medicare Other

## 2013-08-07 VITALS — BP 131/79 | HR 78 | Resp 18

## 2013-08-07 DIAGNOSIS — E1149 Type 2 diabetes mellitus with other diabetic neurological complication: Secondary | ICD-10-CM

## 2013-08-07 DIAGNOSIS — M79609 Pain in unspecified limb: Secondary | ICD-10-CM

## 2013-08-07 DIAGNOSIS — E1142 Type 2 diabetes mellitus with diabetic polyneuropathy: Secondary | ICD-10-CM

## 2013-08-07 DIAGNOSIS — B351 Tinea unguium: Secondary | ICD-10-CM

## 2013-08-07 NOTE — Progress Notes (Signed)
   Subjective:    Patient ID: Andrew Winters, male    DOB: Jun 22, 1941, 73 y.o.   MRN: [id number]  HPI I am here to get my toenails cut    Review of Systems no new changes or findings     Objective:   Physical Exam Vascular status is intact with pedal pulses DP +2/4 bilateral PT thready nonpalpable bilateral epicritic sensation diminished on Semmes Weinstein testing to the forefoot and digits bilateral there is thick brittle friable discolored nails with tenderness and proptosis incurvation one through 5 bilateral. No secondary infections no ascending psoas lymphangitis noted. Mild digital contractures 2 through 5 bilateral with semirigid contractures being noted. Mild HAV deformity no ulcerations no secondary wounds or infections noted.       Assessment & Plan:  Assessment this time his diabetes with peripheral neuropathy. Patient also has onychomycosis criptotic incurvated friable brittle discolored nails 1 through 5 bilateral which are debrided at this time the presence of pain in symptomology as well as diabetes and complications. Return for future palliative nail care in 3 months for an as-needed basis next  Alvan Dame DPM

## 2013-08-07 NOTE — Patient Instructions (Signed)
Diabetes and Foot Care Diabetes may cause you to have problems because of poor blood supply (circulation) to your feet and legs. This may cause the skin on your feet to become thinner, break easier, and heal more slowly. Your skin may become dry, and the skin may peel and crack. You may also have nerve damage in your legs and feet causing decreased feeling in them. You may not notice minor injuries to your feet that could lead to infections or more serious problems. Taking care of your feet is one of the most important things you can do for yourself.  HOME CARE INSTRUCTIONS  Wear shoes at all times, even in the house. Do not go barefoot. Bare feet are easily injured.  Check your feet daily for blisters, cuts, and redness. If you cannot see the bottom of your feet, use a mirror or ask someone for help.  Wash your feet with warm water (do not use hot water) and mild soap. Then pat your feet and the areas between your toes until they are completely dry. Do not soak your feet as this can dry your skin.  Apply a moisturizing lotion or petroleum jelly (that does not contain alcohol and is unscented) to the skin on your feet and to dry, brittle toenails. Do not apply lotion between your toes.  Trim your toenails straight across. Do not dig under them or around the cuticle. File the edges of your nails with an emery board or nail file.  Do not cut corns or calluses or try to remove them with medicine.  Wear clean socks or stockings every day. Make sure they are not too tight. Do not wear knee-high stockings since they may decrease blood flow to your legs.  Wear shoes that fit properly and have enough cushioning. To break in new shoes, wear them for just a few hours a day. This prevents you from injuring your feet. Always look in your shoes before you put them on to be sure there are no objects inside.  Do not cross your legs. This may decrease the blood flow to your feet.  If you find a minor scrape,  cut, or break in the skin on your feet, keep it and the skin around it clean and dry. These areas may be cleansed with mild soap and water. Do not cleanse the area with peroxide, alcohol, or iodine.  When you remove an adhesive bandage, be sure not to damage the skin around it.  If you have a wound, look at it several times a day to make sure it is healing.  Do not use heating pads or hot water bottles. They may burn your skin. If you have lost feeling in your feet or legs, you may not know it is happening until it is too late.  Make sure your health care provider performs a complete foot exam at least annually or more often if you have foot problems. Report any cuts, sores, or bruises to your health care provider immediately. SEEK MEDICAL CARE IF:   You have an injury that is not healing.  You have cuts or breaks in the skin.  You have an ingrown nail.  You notice redness on your legs or feet.  You feel burning or tingling in your legs or feet.  You have pain or cramps in your legs and feet.  Your legs or feet are numb.  Your feet always feel cold. SEEK IMMEDIATE MEDICAL CARE IF:   There is increasing redness,   swelling, or pain in or around a wound.  There is a red line that goes up your leg.  Pus is coming from a wound.  You develop a fever or as directed by your health care provider.  You notice a bad smell coming from an ulcer or wound. Document Released: 06/24/2000 Document Revised: 02/27/2013 Document Reviewed: 12/04/2012 Exit[employer] Patient Information 2014 [employer].  

## 2013-09-18 ENCOUNTER — Telehealth: Payer: Self-pay | Admitting: *Deleted

## 2013-09-18 NOTE — Telephone Encounter (Signed)
Pt's wife wants to know status of pt's diabetic shoes.

## 2013-09-19 NOTE — Telephone Encounter (Signed)
[place] patient. Forwarded to [employer]

## 2013-09-25 ENCOUNTER — Ambulatory Visit: Payer: Medicare Other

## 2013-11-06 ENCOUNTER — Ambulatory Visit (INDEPENDENT_AMBULATORY_CARE_PROVIDER_SITE_OTHER): Payer: Medicare Other

## 2013-11-06 VITALS — BP 133/75 | HR 72 | Resp 18

## 2013-11-06 DIAGNOSIS — M79609 Pain in unspecified limb: Secondary | ICD-10-CM

## 2013-11-06 DIAGNOSIS — L608 Other nail disorders: Secondary | ICD-10-CM

## 2013-11-06 DIAGNOSIS — B351 Tinea unguium: Secondary | ICD-10-CM

## 2013-11-06 DIAGNOSIS — E1142 Type 2 diabetes mellitus with diabetic polyneuropathy: Secondary | ICD-10-CM

## 2013-11-06 DIAGNOSIS — E1149 Type 2 diabetes mellitus with other diabetic neurological complication: Secondary | ICD-10-CM

## 2013-11-06 NOTE — Progress Notes (Signed)
   Subjective:    Patient ID: Andrew Winters, male    DOB: 02-25-1941, 73 y.o.   MRN: [id number]  HPI I am here to get my nails trimmed up and to get my diabetic shoes    Review of Systems no new systemic findings or changes are noted     Objective:   Physical Exam 73 year old [place] American male presents at this time for diabetic foot and nail care and pick up and fitting of diabetic extra-depth shoes well-developed well-nourished oriented x3 vital signs stable lower extremity objective findings DP pulses +2/4 bilateral PT thready nonpalpable bilateral temperature warm to cool turgor diminished there is mild + edema no rubor pallor noted mild varicosities noted neurologically epicritic and proprioceptive sensations diminished on Semmes Weinstein testing to the digits and plantar foot. Nails thick brittle crumbly friable discolored and tender 1 through 5 bilateral they're an open wounds ulcerations no secondary infection is noted at this time has mild digital contractures HAV deformity. Patient presents and has been for shoes the new shoes and custom insoles fit and contour well full contact the patient's feet patients will give and oral instructions for shoewear replacing the same exact model as he had previously which is worn need replacing at this time.       Assessment & Plan:  Assessment this time is diabetes with history peripheral neuropathy and angiopathy does have onychomycosis friable criptotic incurvated nails 1 through 5 bilateral debridement at this time the presence of pain in symptomology diabetes also dispensed 1 pair shoes and 3 pairs of dual density Plastizote inlays which fit and contour well to the foot reappointed 3 months for continued palliative diabetic foot and nail care  Alvan Dame DPM

## 2013-11-06 NOTE — Patient Instructions (Signed)
Diabetes and Foot Care Diabetes may cause you to have problems because of poor blood supply (circulation) to your feet and legs. This may cause the skin on your feet to become thinner, break easier, and heal more slowly. Your skin may become dry, and the skin may peel and crack. You may also have nerve damage in your legs and feet causing decreased feeling in them. You may not notice minor injuries to your feet that could lead to infections or more serious problems. Taking care of your feet is one of the most important things you can do for yourself.  HOME CARE INSTRUCTIONS  Wear shoes at all times, even in the house. Do not go barefoot. Bare feet are easily injured.  Check your feet daily for blisters, cuts, and redness. If you cannot see the bottom of your feet, use a mirror or ask someone for help.  Wash your feet with warm water (do not use hot water) and mild soap. Then pat your feet and the areas between your toes until they are completely dry. Do not soak your feet as this can dry your skin.  Apply a moisturizing lotion or petroleum jelly (that does not contain alcohol and is unscented) to the skin on your feet and to dry, brittle toenails. Do not apply lotion between your toes.  Trim your toenails straight across. Do not dig under them or around the cuticle. File the edges of your nails with an emery board or nail file.  Do not cut corns or calluses or try to remove them with medicine.  Wear clean socks or stockings every day. Make sure they are not too tight. Do not wear knee-high stockings since they may decrease blood flow to your legs.  Wear shoes that fit properly and have enough cushioning. To break in new shoes, wear them for just a few hours a day. This prevents you from injuring your feet. Always look in your shoes before you put them on to be sure there are no objects inside.  Do not cross your legs. This may decrease the blood flow to your feet.  If you find a minor scrape,  cut, or break in the skin on your feet, keep it and the skin around it clean and dry. These areas may be cleansed with mild soap and water. Do not cleanse the area with peroxide, alcohol, or iodine.  When you remove an adhesive bandage, be sure not to damage the skin around it.  If you have a wound, look at it several times a day to make sure it is healing.  Do not use heating pads or hot water bottles. They may burn your skin. If you have lost feeling in your feet or legs, you may not know it is happening until it is too late.  Make sure your health care provider performs a complete foot exam at least annually or more often if you have foot problems. Report any cuts, sores, or bruises to your health care provider immediately. SEEK MEDICAL CARE IF:   You have an injury that is not healing.  You have cuts or breaks in the skin.  You have an ingrown nail.  You notice redness on your legs or feet.  You feel burning or tingling in your legs or feet.  You have pain or cramps in your legs and feet.  Your legs or feet are numb.  Your feet always feel cold. SEEK IMMEDIATE MEDICAL CARE IF:   There is increasing redness,   swelling, or pain in or around a wound.  There is a red line that goes up your leg.  Pus is coming from a wound.  You develop a fever or as directed by your health care provider.  You notice a bad smell coming from an ulcer or wound. Document Released: 06/24/2000 Document Revised: 02/27/2013 Document Reviewed: 12/04/2012 Exit[employer] Patient Information 2014 [employer].  

## 2014-02-05 ENCOUNTER — Ambulatory Visit (INDEPENDENT_AMBULATORY_CARE_PROVIDER_SITE_OTHER): Payer: Medicare Other

## 2014-02-05 VITALS — BP 139/74 | HR 78 | Resp 18

## 2014-02-05 DIAGNOSIS — B351 Tinea unguium: Secondary | ICD-10-CM

## 2014-02-05 DIAGNOSIS — M79606 Pain in leg, unspecified: Secondary | ICD-10-CM

## 2014-02-05 DIAGNOSIS — E1142 Type 2 diabetes mellitus with diabetic polyneuropathy: Secondary | ICD-10-CM

## 2014-02-05 DIAGNOSIS — M79609 Pain in unspecified limb: Secondary | ICD-10-CM

## 2014-02-05 DIAGNOSIS — L608 Other nail disorders: Secondary | ICD-10-CM

## 2014-02-05 DIAGNOSIS — E1149 Type 2 diabetes mellitus with other diabetic neurological complication: Secondary | ICD-10-CM

## 2014-02-05 NOTE — Patient Instructions (Signed)
Diabetes and Foot Care Diabetes may cause you to have problems because of poor blood supply (circulation) to your feet and legs. This may cause the skin on your feet to become thinner, break easier, and heal more slowly. Your skin may become dry, and the skin may peel and crack. You may also have nerve damage in your legs and feet causing decreased feeling in them. You may not notice minor injuries to your feet that could lead to infections or more serious problems. Taking care of your feet is one of the most important things you can do for yourself.  HOME CARE INSTRUCTIONS  Wear shoes at all times, even in the house. Do not go barefoot. Bare feet are easily injured.  Check your feet daily for blisters, cuts, and redness. If you cannot see the bottom of your feet, use a mirror or ask someone for help.  Wash your feet with warm water (do not use hot water) and mild soap. Then pat your feet and the areas between your toes until they are completely dry. Do not soak your feet as this can dry your skin.  Apply a moisturizing lotion or petroleum jelly (that does not contain alcohol and is unscented) to the skin on your feet and to dry, brittle toenails. Do not apply lotion between your toes.  Trim your toenails straight across. Do not dig under them or around the cuticle. File the edges of your nails with an emery board or nail file.  Do not cut corns or calluses or try to remove them with medicine.  Wear clean socks or stockings every day. Make sure they are not too tight. Do not wear knee-high stockings since they may decrease blood flow to your legs.  Wear shoes that fit properly and have enough cushioning. To break in new shoes, wear them for just a few hours a day. This prevents you from injuring your feet. Always look in your shoes before you put them on to be sure there are no objects inside.  Do not cross your legs. This may decrease the blood flow to your feet.  If you find a minor scrape,  cut, or break in the skin on your feet, keep it and the skin around it clean and dry. These areas may be cleansed with mild soap and water. Do not cleanse the area with peroxide, alcohol, or iodine.  When you remove an adhesive bandage, be sure not to damage the skin around it.  If you have a wound, look at it several times a day to make sure it is healing.  Do not use heating pads or hot water bottles. They may burn your skin. If you have lost feeling in your feet or legs, you may not know it is happening until it is too late.  Make sure your health care provider performs a complete foot exam at least annually or more often if you have foot problems. Report any cuts, sores, or bruises to your health care provider immediately. SEEK MEDICAL CARE IF:   You have an injury that is not healing.  You have cuts or breaks in the skin.  You have an ingrown nail.  You notice redness on your legs or feet.  You feel burning or tingling in your legs or feet.  You have pain or cramps in your legs and feet.  Your legs or feet are numb.  Your feet always feel cold. SEEK IMMEDIATE MEDICAL CARE IF:   There is increasing redness,   swelling, or pain in or around a wound.  There is a red line that goes up your leg.  Pus is coming from a wound.  You develop a fever or as directed by your health care provider.  You notice a bad smell coming from an ulcer or wound. Document Released: 06/24/2000 Document Revised: 02/27/2013 Document Reviewed: 12/04/2012 ExitCare Patient Information 2015 [employer]. This information is not intended to replace advice given to you by your health care provider. Make sure you discuss any questions you have with your health care provider.  

## 2014-02-05 NOTE — Progress Notes (Signed)
   Subjective:    Patient ID: Andrew Winters, male    DOB: 1941/05/18, 73 y.o.   MRN: [id number]  HPI I AM HERE TO HAVE MY SHOES CHECKED AND TO GET MY NAILS TRIMMED    Review of Systems no new findings or systemic changes noted     Objective:   Physical Exam Lower from objective findings intact and unchanged patient is diabetic shoes the shoes fit and contour well and fit comfortably patient ambulating well with a new shoes neurovascular status is intact pedal pulses palpable DP +2/4 PT thready bilateral mild +1 edema noted bilateral minimal varicosities noted as well nails thick brittle crumbly friable discolored 1 through 5 bilateral there is mild flexible digital contractures noted no open wounds ulcerations. Neuropathy standpoint patient has intact sensation although decreased the distal forefoot digits and patient is also having some abnormal sensation sometimes at night are obviously been with possible paresthesia or peripheral neuropathy symptoms of the plantar foot and arch areas. Again likely progression of his neuropathy his most recent blood glucose this morning was noted to 20 range however 2 days ago he did get a steroid injection at the orthopedic office. This will temporarily elevate his blood sugars as as indicated       Assessment & Plan:  Assessment diabetes with history peripheral neuropathy and some and agitation of the peripheral nerves being noted did recommend taking the multivitamin with a complex and folic acid. Also maintain good shoes at all times and diabetic insoles thick brittle crumbly friable mycotic nails 1 through 5 bilateral debrided this time the presence of mycosis as well as diabetes and complications return for future diabetic foot and palliative nail care as needed recommended a 3 month followup.  Alvan Dame DPM

## 2014-05-05 ENCOUNTER — Ambulatory Visit (INDEPENDENT_AMBULATORY_CARE_PROVIDER_SITE_OTHER): Payer: Medicare Other

## 2014-05-05 DIAGNOSIS — E1142 Type 2 diabetes mellitus with diabetic polyneuropathy: Secondary | ICD-10-CM

## 2014-05-05 DIAGNOSIS — B351 Tinea unguium: Secondary | ICD-10-CM

## 2014-05-05 DIAGNOSIS — G629 Polyneuropathy, unspecified: Secondary | ICD-10-CM

## 2014-05-05 DIAGNOSIS — M79606 Pain in leg, unspecified: Secondary | ICD-10-CM

## 2014-05-05 NOTE — Progress Notes (Signed)
   Subjective:    Patient ID: Andrew Winters, male    DOB: 1941-06-23, 73 y.o.   MRN: [id number]  HPI  TOENAILS TRIM.  Review of Systems no new findings or systemic changes noted     Objective:   Physical Exam  lower extremity objective findings unchanged vascular status reveals pedal pulses palpable DP +2 over 4 PT +1 over 4 bilateral mild edema noted no rib or pallor noted neurologically epicritic and proprioceptive sensation is diminished and Semmes Weinstein to the forefoot digits and arch. There is normal plantar response DTRs not listed dermatologic the skin color pigment normal hair growth absent nails criptotic brittle Crumley friable and discolored and tender 1 through 5 bilateral painful symptomatic both debridement and on palpation (no ulcers no secondary infections there is pinch callusing of the hallux bilateral no other open wounds rectus foot made noted bilateral mild digital contractures       Assessment & Plan:   assessment diabetes with history of peripheral neuropathy as well as painful mycotic nails 1 through 5 bilateral mycotic nails debrided at this time  In the presence of pain symptomology and diabetes return for future palliative care every 3 months as recommended  Alvan Dame DPM

## 2014-05-05 NOTE — Patient Instructions (Signed)
Diabetes and Foot Care Diabetes may cause you to have problems because of poor blood supply (circulation) to your feet and legs. This may cause the skin on your feet to become thinner, break easier, and heal more slowly. Your skin may become dry, and the skin may peel and crack. You may also have nerve damage in your legs and feet causing decreased feeling in them. You may not notice minor injuries to your feet that could lead to infections or more serious problems. Taking care of your feet is one of the most important things you can do for yourself.  HOME CARE INSTRUCTIONS  Wear shoes at all times, even in the house. Do not go barefoot. Bare feet are easily injured.  Check your feet daily for blisters, cuts, and redness. If you cannot see the bottom of your feet, use a mirror or ask someone for help.  Wash your feet with warm water (do not use hot water) and mild soap. Then pat your feet and the areas between your toes until they are completely dry. Do not soak your feet as this can dry your skin.  Apply a moisturizing lotion or petroleum jelly (that does not contain alcohol and is unscented) to the skin on your feet and to dry, brittle toenails. Do not apply lotion between your toes.  Trim your toenails straight across. Do not dig under them or around the cuticle. File the edges of your nails with an emery board or nail file.  Do not cut corns or calluses or try to remove them with medicine.  Wear clean socks or stockings every day. Make sure they are not too tight. Do not wear knee-high stockings since they may decrease blood flow to your legs.  Wear shoes that fit properly and have enough cushioning. To break in new shoes, wear them for just a few hours a day. This prevents you from injuring your feet. Always look in your shoes before you put them on to be sure there are no objects inside.  Do not cross your legs. This may decrease the blood flow to your feet.  If you find a minor scrape,  cut, or break in the skin on your feet, keep it and the skin around it clean and dry. These areas may be cleansed with mild soap and water. Do not cleanse the area with peroxide, alcohol, or iodine.  When you remove an adhesive bandage, be sure not to damage the skin around it.  If you have a wound, look at it several times a day to make sure it is healing.  Do not use heating pads or hot water bottles. They may burn your skin. If you have lost feeling in your feet or legs, you may not know it is happening until it is too late.  Make sure your health care provider performs a complete foot exam at least annually or more often if you have foot problems. Report any cuts, sores, or bruises to your health care provider immediately. SEEK MEDICAL CARE IF:   You have an injury that is not healing.  You have cuts or breaks in the skin.  You have an ingrown nail.  You notice redness on your legs or feet.  You feel burning or tingling in your legs or feet.  You have pain or cramps in your legs and feet.  Your legs or feet are numb.  Your feet always feel cold. SEEK IMMEDIATE MEDICAL CARE IF:   There is increasing redness,   swelling, or pain in or around a wound.  There is a red line that goes up your leg.  Pus is coming from a wound.  You develop a fever or as directed by your health care provider.  You notice a bad smell coming from an ulcer or wound. Document Released: 06/24/2000 Document Revised: 02/27/2013 Document Reviewed: 12/04/2012 ExitCare Patient Information 2015 [employer]. This information is not intended to replace advice given to you by your health care provider. Make sure you discuss any questions you have with your health care provider.  

## 2014-05-07 ENCOUNTER — Ambulatory Visit: Payer: Medicare Other

## 2014-07-23 DIAGNOSIS — M791 Myalgia: Secondary | ICD-10-CM | POA: Diagnosis not present

## 2014-08-01 ENCOUNTER — Ambulatory Visit: Payer: Medicare Other

## 2014-08-18 ENCOUNTER — Ambulatory Visit (INDEPENDENT_AMBULATORY_CARE_PROVIDER_SITE_OTHER): Payer: Medicare Other

## 2014-08-18 VITALS — BP 139/78 | HR 73 | Resp 18

## 2014-08-18 DIAGNOSIS — B351 Tinea unguium: Secondary | ICD-10-CM

## 2014-08-18 DIAGNOSIS — G629 Polyneuropathy, unspecified: Secondary | ICD-10-CM

## 2014-08-18 DIAGNOSIS — M79606 Pain in leg, unspecified: Secondary | ICD-10-CM

## 2014-08-18 DIAGNOSIS — E1142 Type 2 diabetes mellitus with diabetic polyneuropathy: Secondary | ICD-10-CM

## 2014-08-18 NOTE — Progress Notes (Signed)
   Subjective:    Patient ID: Andrew Winters, male    DOB: 1940/11/30, 74 y.o.   MRN: [id number]  HPI I NEED MY TOENAILS TRIMMED UP    Review of Systems no new findings or systemic changes noted     Objective:   Physical Exam Neurovascular status is intact and unchanged patient is a history of diabetes and complications swelling lower extremity edema or status pedal pulses DP +2 PT plus one over 4 bilateral Refill timed 3-4 seconds all digits epicritic and proprioceptive sensations intact although diminished on Semmes Weinstein to the forefoot and digits. Neurologically epicritic sensation decreased on Semmes Weinstein to the forefoot and arch normal plantar response and DTRs. Nails thick brittle Crumley dystrophic friable and discolored 1 through 5 bilateral no open wounds no ulcers no secondary infections mild flexible digital contractures noted bilateral patient has been a diabetic shoes these replacement as previous shoes are worn and broken down we authorization for new diabetic extra depth shoes and insoles in the future.       Assessment & Plan:  Assessment diabetes history peripheral neuropathy and mild angiopathy. Thick brittle dystrophic friable mycotic nails 1 through 5 bilateral debrided at this time the presence of diabetes and onychomycosis and pain symptomology. Also at this time we'll obtain authorization for diabetic extra shoes patient does have history of diabetes application of peripheral neuropathy and mild angiopathy there is decreased sensation on Semmes Weinstein to the forefoot and digits will follow-up in the future as needed for palliative care and we'll contact and authorization for shoes available next  Alvan Dame DPM

## 2014-08-22 DIAGNOSIS — E1140 Type 2 diabetes mellitus with diabetic neuropathy, unspecified: Secondary | ICD-10-CM | POA: Diagnosis not present

## 2014-08-22 DIAGNOSIS — R197 Diarrhea, unspecified: Secondary | ICD-10-CM | POA: Diagnosis not present

## 2014-09-10 DIAGNOSIS — R0683 Snoring: Secondary | ICD-10-CM | POA: Diagnosis not present

## 2014-09-22 DIAGNOSIS — I10 Essential (primary) hypertension: Secondary | ICD-10-CM | POA: Diagnosis not present

## 2014-09-22 DIAGNOSIS — E119 Type 2 diabetes mellitus without complications: Secondary | ICD-10-CM | POA: Diagnosis not present

## 2014-09-22 DIAGNOSIS — R972 Elevated prostate specific antigen [PSA]: Secondary | ICD-10-CM | POA: Diagnosis not present

## 2014-10-06 ENCOUNTER — Ambulatory Visit: Payer: Medicare Other

## 2014-10-17 DIAGNOSIS — M1711 Unilateral primary osteoarthritis, right knee: Secondary | ICD-10-CM | POA: Diagnosis not present

## 2014-10-23 ENCOUNTER — Ambulatory Visit (INDEPENDENT_AMBULATORY_CARE_PROVIDER_SITE_OTHER): Payer: Medicare Other | Admitting: Podiatrist

## 2014-10-23 DIAGNOSIS — E1142 Type 2 diabetes mellitus with diabetic polyneuropathy: Secondary | ICD-10-CM

## 2014-10-23 DIAGNOSIS — G629 Polyneuropathy, unspecified: Secondary | ICD-10-CM

## 2014-10-23 NOTE — Patient Instructions (Signed)
PATIENT WAS MEASURED FOR DIABETIC SHOES. Andrew Winters

## 2014-10-24 ENCOUNTER — Ambulatory Visit: Payer: Medicare Other

## 2014-10-24 NOTE — Progress Notes (Signed)
Patient presents today for diabetic shoe measuring been notified when the diabetic shoes are ready for pickup.

## 2014-10-27 DIAGNOSIS — M1712 Unilateral primary osteoarthritis, left knee: Secondary | ICD-10-CM | POA: Diagnosis not present

## 2014-10-27 DIAGNOSIS — M1711 Unilateral primary osteoarthritis, right knee: Secondary | ICD-10-CM | POA: Diagnosis not present

## 2014-11-21 ENCOUNTER — Ambulatory Visit: Payer: Medicare Other | Admitting: Podiatrist

## 2014-11-21 ENCOUNTER — Ambulatory Visit (INDEPENDENT_AMBULATORY_CARE_PROVIDER_SITE_OTHER): Payer: Medicare Other | Admitting: Podiatrist

## 2014-11-21 ENCOUNTER — Encounter: Payer: Self-pay | Admitting: Podiatrist

## 2014-11-21 VITALS — BP 145/74 | HR 68 | Resp 18

## 2014-11-21 DIAGNOSIS — G629 Polyneuropathy, unspecified: Secondary | ICD-10-CM | POA: Diagnosis not present

## 2014-11-21 DIAGNOSIS — B351 Tinea unguium: Secondary | ICD-10-CM

## 2014-11-21 DIAGNOSIS — E1142 Type 2 diabetes mellitus with diabetic polyneuropathy: Secondary | ICD-10-CM

## 2014-11-21 DIAGNOSIS — M79606 Pain in leg, unspecified: Secondary | ICD-10-CM

## 2014-11-25 NOTE — Progress Notes (Signed)
HPI: Patient presents today for follow up of diabetic foot and nail care. Past medical history, meds, and allergies reviewed.   Objective:   Objective:  Patients chart is reviewed.  Vascular status reveals pedal pulses noted at  1 out of 4 dp and pt bilateral .  Neurological sensation is Decreased to [facility] monofilament bilateral at 3/5 sites bilateral.  Dermatological exam reveals  absence of pre ulcerative/ hyperkeratotic lesions.   Toenails are elongated, incurvated, discolored, dystrophic with ingrown deformity present.    Assessment: Diabetes with Neuropathy, nail dystrophy  Plan: Discussed treatment options and alternatives. Debrided nails without complication.   Return appointment recommended at routine intervals of 3 months.   Marlowe Aschoff, DPM

## 2014-12-01 ENCOUNTER — Other Ambulatory Visit: Payer: Self-pay | Admitting: Orthopaedic Surgery

## 2014-12-22 ENCOUNTER — Ambulatory Visit: Payer: Medicare Other | Admitting: Podiatry

## 2014-12-25 ENCOUNTER — Ambulatory Visit (HOSPITAL_COMMUNITY)
Admission: RE | Admit: 2014-12-25 | Discharge: 2014-12-25 | Disposition: A | Discharge status: Home / Self Care | Payer: Medicare Other | Source: Ambulatory Visit | Attending: Orthopaedic Surgery | Admitting: Orthopaedic Surgery

## 2014-12-25 ENCOUNTER — Encounter (HOSPITAL_COMMUNITY): Payer: Self-pay

## 2014-12-25 ENCOUNTER — Encounter (HOSPITAL_COMMUNITY)
Admission: RE | Admit: 2014-12-25 | Discharge: 2014-12-25 | Disposition: A | Discharge status: Home / Self Care | Payer: Medicare Other | Source: Ambulatory Visit | Attending: Orthopaedic Surgery | Admitting: Orthopaedic Surgery

## 2014-12-25 DIAGNOSIS — Z01812 Encounter for preprocedural laboratory examination: Secondary | ICD-10-CM | POA: Insufficient documentation

## 2014-12-25 DIAGNOSIS — M5134 Other intervertebral disc degeneration, thoracic region: Secondary | ICD-10-CM | POA: Insufficient documentation

## 2014-12-25 DIAGNOSIS — I10 Essential (primary) hypertension: Secondary | ICD-10-CM | POA: Diagnosis not present

## 2014-12-25 DIAGNOSIS — Z0183 Encounter for blood typing: Secondary | ICD-10-CM | POA: Diagnosis not present

## 2014-12-25 DIAGNOSIS — Z01818 Encounter for other preprocedural examination: Secondary | ICD-10-CM | POA: Insufficient documentation

## 2014-12-25 DIAGNOSIS — R9431 Abnormal electrocardiogram [ECG] [EKG]: Secondary | ICD-10-CM | POA: Insufficient documentation

## 2014-12-25 DIAGNOSIS — M179 Osteoarthritis of knee, unspecified: Secondary | ICD-10-CM | POA: Insufficient documentation

## 2014-12-25 HISTORY — DX: Noninfective gastroenteritis and colitis, unspecified: K52.9

## 2014-12-25 HISTORY — DX: Unspecified cataract: H26.9

## 2014-12-25 HISTORY — DX: Pneumonia, unspecified organism: J18.9

## 2014-12-25 HISTORY — DX: Insomnia, unspecified: G47.00

## 2014-12-25 HISTORY — DX: Nocturia: R35.1

## 2014-12-25 HISTORY — DX: Polyneuropathy, unspecified: G62.9

## 2014-12-25 HISTORY — DX: Dysphagia, unspecified: R13.10

## 2014-12-25 LAB — BASIC METABOLIC PANEL
Anion gap: 9 (ref 5–15)
BUN: 15 mg/dL (ref 6–20)
CHLORIDE: 113 mmol/L — AB (ref 101–111)
CO2: 19 mmol/L — AB (ref 22–32)
Calcium: 9.5 mg/dL (ref 8.9–10.3)
Creatinine, Ser: 1.04 mg/dL (ref 0.61–1.24)
GFR calc non Af Amer: >60 mL/min (ref >60)
Glucose, Bld: 120 mg/dL — ABNORMAL HIGH (ref 65–99)
Potassium: 3.6 mmol/L (ref 3.5–5.1)
Sodium: 141 mmol/L (ref 135–145)

## 2014-12-25 LAB — URINALYSIS, ROUTINE W REFLEX MICROSCOPIC
Bilirubin Urine: NEGATIVE
GLUCOSE, UA: NEGATIVE mg/dL
Hgb urine dipstick: NEGATIVE
Ketones, ur: NEGATIVE mg/dL
Leukocytes, UA: NEGATIVE
NITRITE: NEGATIVE
PH: 5 (ref 5.0–8.0)
Protein, ur: NEGATIVE mg/dL
Specific Gravity, Urine: 1.023 (ref 1.005–1.030)
Urobilinogen, UA: 0.2 mg/dL (ref 0.0–1.0)

## 2014-12-25 LAB — TYPE AND SCREEN
ABO/RH(D): O POS
Antibody Screen: NEGATIVE

## 2014-12-25 LAB — CBC WITH DIFFERENTIAL/PLATELET
Basophils Absolute: 0 10*3/uL (ref 0.0–0.1)
Basophils Relative: 0 % (ref 0–1)
Eosinophils Absolute: 0.1 10*3/uL (ref 0.0–0.7)
Eosinophils Relative: 2 % (ref 0–5)
HEMATOCRIT: 41 % (ref 39.0–52.0)
HEMOGLOBIN: 13.3 g/dL (ref 13.0–17.0)
LYMPHS PCT: 33 % (ref 12–46)
Lymphs Abs: 2.1 10*3/uL (ref 0.7–4.0)
MCH: 27.4 pg (ref 26.0–34.0)
MCHC: 32.4 g/dL (ref 30.0–36.0)
MCV: 84.5 fL (ref 78.0–100.0)
MONO ABS: 0.3 10*3/uL (ref 0.1–1.0)
MONOS PCT: 5 % (ref 3–12)
NEUTROS ABS: 3.8 10*3/uL (ref 1.7–7.7)
Neutrophils Relative %: 60 % (ref 43–77)
Platelets: 214 10*3/uL (ref 150–400)
RBC: 4.85 MIL/uL (ref 4.22–5.81)
RDW: 16.7 % — ABNORMAL HIGH (ref 11.5–15.5)
WBC: 6.4 10*3/uL (ref 4.0–10.5)

## 2014-12-25 LAB — PROTIME-INR
INR: 1.11 (ref 0.00–1.49)
PROTHROMBIN TIME: 14.5 s (ref 11.6–15.2)

## 2014-12-25 LAB — SURGICAL PCR SCREEN
MRSA, PCR: NEGATIVE
Staphylococcus aureus: NEGATIVE

## 2014-12-25 LAB — GLUCOSE, CAPILLARY: Glucose-Capillary: 131 mg/dL — ABNORMAL HIGH (ref 65–99)

## 2014-12-25 LAB — ABO/RH: ABO/RH(D): O POS

## 2014-12-25 LAB — APTT: aPTT: 29 seconds (ref 24–37)

## 2014-12-25 MED ORDER — CHLORHEXIDINE GLUCONATE 4 % EX LIQD: 60.0000 mL | Freq: Once | CUTANEOUS | Status: DC

## 2014-12-25 NOTE — Pre-Procedure Instructions (Signed)
Andrew Winters  12/25/2014      [facility] [address], [place] - [address] [place] [address] Phone: [phone] Fax: [phone]    Your procedure is scheduled on Tues, June 28 @ 10:05 AM  Report to [facility] Admitting at 8:00 AM  Call this number if you have problems the morning of surgery:  [phone]   Remember:  Do not eat food or drink liquids after midnight.  Take these medicines the morning of surgery with A SIP OF WATER:Amlodipine(Norvasc),Flonase(Fluticasone),Gabapentin(Neurontin),Omeprazole(Prilosec),Zoloft(Sertraline),and Tramadol(Ultram--if needed)               Stop taking your Mobic. No Goody's,BC's,Aleve,Aspirin,Ibuprofen,Fish Oil,or any Herbal Medications.    Do not wear jewelry.  Do not wear lotions, powders, or colognes.  You may wear deodorant.  Men may shave face and neck.  Do not bring valuables to the hospital.  [facility] is not responsible for any belongings or valuables.  Contacts, dentures or bridgework may not be worn into surgery.  Leave your suitcase in the car.  After surgery it may be brought to your room.  For patients admitted to the hospital, discharge time will be determined by your treatment team.  Patients discharged the day of surgery will not be allowed to drive home.    Special instructions:  [place] - Preparing for Surgery  Before surgery, you can play an important role.  Because skin is not sterile, your skin needs to be as free of germs as possible.  You can reduce the number of germs on you skin by washing with CHG (chlorahexidine gluconate) soap before surgery.  CHG is an antiseptic cleaner which kills germs and bonds with the skin to continue killing germs even after washing.  Please DO NOT use if you have an allergy to CHG or antibacterial soaps.  If your skin becomes reddened/irritated stop using the CHG and inform your nurse when you arrive at Short Stay.  Do  not shave (including legs and underarms) for at least 48 hours prior to the first CHG shower.  You may shave your face.  Please follow these instructions carefully:   1.  Shower with CHG Soap the night before surgery and the                                morning of Surgery.  2.  If you choose to wash your hair, wash your hair first as usual with your       normal shampoo.  3.  After you shampoo, rinse your hair and body thoroughly to remove the                      Shampoo.  4.  Use CHG as you would any other liquid soap.  You can apply chg directly       to the skin and wash gently with scrungie or a clean washcloth.  5.  Apply the CHG Soap to your body ONLY FROM THE NECK DOWN.        Do not use on open wounds or open sores.  Avoid contact with your eyes,       ears, mouth and genitals (private parts).  Wash genitals (private parts)       with your normal soap.  6.  Wash thoroughly, paying special attention to the area where your surgery  will be performed.  7.  Thoroughly rinse your body with warm water from the neck down.  8.  DO NOT shower/wash with your normal soap after using and rinsing off       the CHG Soap.  9.  Pat yourself dry with a clean towel.            10.  Wear clean pajamas.            11.  Place clean sheets on your bed the night of your first shower and do not        sleep with pets.  Day of Surgery  Do not apply any lotions/deoderants the morning of surgery.  Please wear clean clothes to the hospital/surgery center.    Please read over the following fact sheets that you were given. Pain Booklet, Coughing and Deep Breathing, MRSA Information and Surgical Site Infection Prevention

## 2014-12-25 NOTE — Progress Notes (Addendum)
Cardiologist pt denies having one   Medical Md is Dr.Jason Leonia Reader  Echo denies ever having   Stress test > 10 yrs ago d/t HTN  Heart cath done 20+yrs ago  EKG denies in past yr  CXR denies in past yr

## 2014-12-25 NOTE — Progress Notes (Signed)
   12/25/14 1351  OBSTRUCTIVE SLEEP APNEA  Have you ever been diagnosed with sleep apnea through a sleep study? No  If yes, do you have and use a CPAP or BPAP machine every night? 0  Do you know the presssure settings on your maching? No  Do you snore loudly (loud enough to be heard through closed doors)?  1  Do you often feel tired, fatigued, or sleepy during the daytime? 1  Has anyone observed you stop breathing during your sleep? 1  Do you have, or are you being treated for high blood pressure? 1  BMI more than 35 kg/m2? 1  Age over 38 years old? 1  Neck circumference greater than 40 cm/16 inches? 1 (18)  Gender: 1

## 2014-12-26 DIAGNOSIS — E119 Type 2 diabetes mellitus without complications: Secondary | ICD-10-CM | POA: Diagnosis not present

## 2014-12-26 NOTE — Progress Notes (Signed)
Anesthesia Chart Review:  Pt is 74 year old male scheduled for R total knee arthroplasty on 01/06/2015 with Dr. Jerl Santos.   PMH includes: HTN, DM, hyperlipidemia, hyperthyroidism, RA, deaf in left ear, GERD. Current smoker. BMI 37.  Medications include: amlodipine, aricept, glimepiride, hctz, lisinopril, pioglitazone, omeprazole, trazodone  Preoperative labs reviewed.   Chest x-ray 12/25/2014 reviewed. No active cardiopulmonary disease.   EKG 12/25/2014: NSR. Possible Left atrial enlargement. Left anterior fascicular block. Inferior infarct, age undetermined. Possible Anterior infarct, age undetermined. Appears unchanged when compared with EKG dated 01/22/2001.   Reviewed case with Dr. Noreene Larsson.   If no changes, I anticipate pt can proceed with surgery as scheduled.   Rica Mast, FNP-BC [facility]/Anesthesiology Phone: [phone] 12/26/2014 3:10 PM

## 2014-12-29 ENCOUNTER — Encounter: Payer: Self-pay | Admitting: Podiatry

## 2014-12-29 ENCOUNTER — Ambulatory Visit (INDEPENDENT_AMBULATORY_CARE_PROVIDER_SITE_OTHER): Payer: Medicare Other | Admitting: Podiatry

## 2014-12-29 VITALS — BP 117/74 | HR 76 | Resp 18

## 2014-12-29 DIAGNOSIS — G629 Polyneuropathy, unspecified: Secondary | ICD-10-CM

## 2014-12-29 DIAGNOSIS — E1142 Type 2 diabetes mellitus with diabetic polyneuropathy: Secondary | ICD-10-CM

## 2014-12-29 NOTE — Progress Notes (Signed)
Subjective:     Patient ID: Andrew Winters, male   DOB: 08-May-1941, 74 y.o.   MRN: [id number]  HPIPatient presents to the office to pick up diabetic shoes.   Review of Systems     Objective:   Physical Exam Objective: Review of past medical history, medications, social history and allergies were performed.  Vascular: Dorsalis pedis and posterior tibial pulses were palpable B/L, capillary refill was  WNL B/L, temperature gradient was WNL B/L   Skin:  No signs of symptoms of infection or ulcers on both feet  Nails: appear healthy with no signs of mycosis or infections  Sensory: Semmes Weinstein monifilament Diminished   Orthopedic: Orthopedic evaluation demonstrates all joints distal t ankle have full ROM without crepitus, muscle power WNL B/L     Assessment:     Diabetic Neuropathy     Plan:     Dispense shoes

## 2015-01-05 MED ORDER — DEXTROSE-NACL 5-0.45 % IV SOLN: INTRAVENOUS | Status: DC

## 2015-01-05 MED ORDER — CEFAZOLIN SODIUM-DEXTROSE 2-3 GM-% IV SOLR
2.0000 g | INTRAVENOUS | Status: AC
  Administered 2015-01-06: 2 g via INTRAVENOUS
  Filled 2015-01-05: qty 50

## 2015-01-05 NOTE — H&P (Signed)
TOTAL KNEE ADMISSION H&P  Patient is being admitted for right total knee arthroplasty.  Subjective:  Chief Complaint:right knee pain.  HPI: Andrew Winters, 74 y.o. male, has a history of pain and functional disability in the right knee due to arthritis and has failed non-surgical conservative treatments for greater than 12 weeks to includeNSAID's and/or analgesics, corticosteriod injections, flexibility and strengthening excercises, supervised PT with diminished ADL's post treatment, use of assistive devices, weight reduction as appropriate and activity modification.  Onset of symptoms was gradual, starting 8 years ago with gradually worsening course since that time. The patient noted no past surgery on the right knee(s).  Patient currently rates pain in the right knee(s) at 9 out of 10 with activity. Patient has night pain, pain that interferes with activities of daily living, pain with passive range of motion, crepitus and joint swelling.  Patient has evidence of subchondral sclerosis, joint subluxation and joint space narrowing by imaging studies. This patient has had no. There is no active infection.  Patient Active Problem List   Diagnosis Date Noted  . DIABETIC PERIPHERAL NEUROPATHY 01/05/2010  . ANKLE PAIN, LEFT 01/05/2010  . HEADACHE 05/07/2009  . UNSPECIFIED VITAMIN D DEFICIENCY 02/03/2009  . LIVER FUNCTION TESTS, ABNORMAL 07/19/2007  . HYPERTHYROIDISM 01/04/2007  . DIABETES MELLITUS, TYPE II 01/04/2007  . HYPERLIPIDEMIA 01/04/2007  . ERECTILE DYSFUNCTION 01/04/2007  . DEPRESSION 01/04/2007  . HYPERTENSION 01/04/2007  . GERD 01/04/2007  . BENIGN PROSTATIC HYPERTROPHY 01/04/2007  . OSTEOARTHRITIS 01/04/2007   Past Medical History  Diagnosis Date  . Bilateral swelling of feet   . Headaches, cluster   . Anxiety   . Fatigue   . Rheumatoid arthritis(714.0)   . Muscle pain   . Hiatal hernia   . Vitamin D deficiency     takes Vit D daily  . BPH (benign prostatic hyperplasia)      no meds needed per medical md  . Osteoarthritis   . Hyperthyroidism   . Deafness     in left ear   . Hyperlipemia     was on meds but hasnt been on anything in about 65months per MD  . Presbyesophagus   . Insomnia     takes Trazodone nightly  . Depression     takes Zoloft daily  . Dysphagia   . Hypertension     takes Amlodipine,HCTZ,and Lisinopril daily  . Pneumonia 3+ yrs ago  . Peripheral neuropathy     takes Gabapentin daily  . Joint pain   . Joint swelling   . GERD (gastroesophageal reflux disease)     takes Omeprazole daily  . Chronic diarrhea   . Nocturia   . Diabetes mellitus     takes Actos and Amaryl daily as well as Onglyza  . Cataracts, bilateral     immature    Past Surgical History  Procedure Laterality Date  . Back surgery  1998  . Colonoscopy with esophagogastroduodenoscopy (egd) and esophageal dilation (ed)      No prescriptions prior to admission   Allergies  Allergen Reactions  . Aspirin     History  Substance Use Topics  . Smoking status: Current Some Day Smoker -- 1.00 packs/day for 35 years    Types: Cigarettes  . Smokeless tobacco: Never Used     Comment: pack last about 2-3wks  . Alcohol Use: No    Family History  Problem Relation Age of Onset  . Heart disease Mother   . Diabetes Mother   . Heart attack  Father   . Colon cancer Neg Hx   . Diabetes Brother      Review of Systems  All other systems reviewed and are negative.   Objective:  Physical Exam  Constitutional: He is oriented to person, place, and time. He appears well-developed.  HENT:  Head: Normocephalic.  Eyes: Conjunctivae are normal.  Neck: Neck supple.  Cardiovascular: Normal rate.   Respiratory: Effort normal.  GI: Soft.  Musculoskeletal:  Calves are soft right knee motion is 0-95 pain with range of motion Tracy effusion crepitation 1+  Neurological: He is oriented to person, place, and time.  Skin: Skin is warm.  Psychiatric: He has a normal mood and  affect.    Vital signs in last 24 hours:    Labs:   Estimated body mass index is 32.99 kg/(m^2) as calculated from the following:   Height as of 04/18/13: 6\' 1"  (1.854 m).   Weight as of 04/18/13: 113.399 kg (250 lb).   Imaging Review Plain radiographs demonstrate severe degenerative joint disease of the right knee(s). The overall alignment isneutral. The bone quality appears to be good for age and reported activity level.  Assessment/Plan:  PrimaryEnd stage arthritis, right knee   The patient history, physical examination, clinical judgment of the provider and imaging studies are consistent with end stage degenerative joint disease of the right knee(s) and total knee arthroplasty is deemed medically necessary. The treatment options including medical management, injection therapy arthroscopy and arthroplasty were discussed at length. The risks and benefits of total knee arthroplasty were presented and reviewed. The risks due to aseptic loosening, infection, stiffness, patella tracking problems, thromboembolic complications and other imponderables were discussed. The patient acknowledged the explanation, agreed to proceed with the plan and consent was signed. Patient is being admitted for inpatient treatment for surgery, pain control, PT, OT, prophylactic antibiotics, VTE prophylaxis, progressive ambulation and ADL's and discharge planning. The patient is planning to be discharged home with home health services

## 2015-01-05 NOTE — Progress Notes (Signed)
Patient notified to arrive at 7:25

## 2015-01-06 ENCOUNTER — Inpatient Hospital Stay (HOSPITAL_COMMUNITY)
Admission: RE | Admit: 2015-01-06 | Discharge: 2015-01-14 | DRG: 469 | Disposition: A | Discharge status: Skilled Nursing Facility | Payer: Medicare Other | Source: Ambulatory Visit | Attending: Orthopaedic Surgery | Admitting: Orthopaedic Surgery

## 2015-01-06 ENCOUNTER — Inpatient Hospital Stay (HOSPITAL_COMMUNITY): Payer: Medicare Other | Admitting: Emergency Medicine

## 2015-01-06 ENCOUNTER — Encounter (HOSPITAL_COMMUNITY)
Admission: RE | Disposition: A | Discharge status: Skilled Nursing Facility | Payer: Self-pay | Source: Ambulatory Visit | Attending: Orthopaedic Surgery

## 2015-01-06 ENCOUNTER — Encounter (HOSPITAL_COMMUNITY): Payer: Self-pay | Admitting: Surgery

## 2015-01-06 ENCOUNTER — Inpatient Hospital Stay (HOSPITAL_COMMUNITY): Payer: Medicare Other | Admitting: Anesthesiology

## 2015-01-06 DIAGNOSIS — M11261 Other chondrocalcinosis, right knee: Secondary | ICD-10-CM | POA: Diagnosis not present

## 2015-01-06 DIAGNOSIS — G4700 Insomnia, unspecified: Secondary | ICD-10-CM | POA: Diagnosis present

## 2015-01-06 DIAGNOSIS — A419 Sepsis, unspecified organism: Secondary | ICD-10-CM | POA: Diagnosis not present

## 2015-01-06 DIAGNOSIS — M179 Osteoarthritis of knee, unspecified: Secondary | ICD-10-CM | POA: Diagnosis not present

## 2015-01-06 DIAGNOSIS — D509 Iron deficiency anemia, unspecified: Secondary | ICD-10-CM | POA: Diagnosis present

## 2015-01-06 DIAGNOSIS — I82622 Acute embolism and thrombosis of deep veins of left upper extremity: Secondary | ICD-10-CM | POA: Diagnosis not present

## 2015-01-06 DIAGNOSIS — Z833 Family history of diabetes mellitus: Secondary | ICD-10-CM

## 2015-01-06 DIAGNOSIS — Z471 Aftercare following joint replacement surgery: Secondary | ICD-10-CM | POA: Diagnosis not present

## 2015-01-06 DIAGNOSIS — G9349 Other encephalopathy: Secondary | ICD-10-CM | POA: Diagnosis not present

## 2015-01-06 DIAGNOSIS — I82612 Acute embolism and thrombosis of superficial veins of left upper extremity: Secondary | ICD-10-CM | POA: Diagnosis not present

## 2015-01-06 DIAGNOSIS — Z8249 Family history of ischemic heart disease and other diseases of the circulatory system: Secondary | ICD-10-CM

## 2015-01-06 DIAGNOSIS — R062 Wheezing: Secondary | ICD-10-CM | POA: Diagnosis not present

## 2015-01-06 DIAGNOSIS — N401 Enlarged prostate with lower urinary tract symptoms: Secondary | ICD-10-CM | POA: Diagnosis present

## 2015-01-06 DIAGNOSIS — G629 Polyneuropathy, unspecified: Secondary | ICD-10-CM | POA: Diagnosis not present

## 2015-01-06 DIAGNOSIS — M1990 Unspecified osteoarthritis, unspecified site: Secondary | ICD-10-CM | POA: Diagnosis not present

## 2015-01-06 DIAGNOSIS — R079 Chest pain, unspecified: Secondary | ICD-10-CM | POA: Diagnosis not present

## 2015-01-06 DIAGNOSIS — E559 Vitamin D deficiency, unspecified: Secondary | ICD-10-CM | POA: Diagnosis present

## 2015-01-06 DIAGNOSIS — F329 Major depressive disorder, single episode, unspecified: Secondary | ICD-10-CM | POA: Diagnosis present

## 2015-01-06 DIAGNOSIS — N39498 Other specified urinary incontinence: Secondary | ICD-10-CM | POA: Diagnosis present

## 2015-01-06 DIAGNOSIS — R262 Difficulty in walking, not elsewhere classified: Secondary | ICD-10-CM | POA: Diagnosis not present

## 2015-01-06 DIAGNOSIS — E1136 Type 2 diabetes mellitus with diabetic cataract: Secondary | ICD-10-CM | POA: Diagnosis not present

## 2015-01-06 DIAGNOSIS — M069 Rheumatoid arthritis, unspecified: Secondary | ICD-10-CM | POA: Diagnosis not present

## 2015-01-06 DIAGNOSIS — R0602 Shortness of breath: Secondary | ICD-10-CM | POA: Diagnosis not present

## 2015-01-06 DIAGNOSIS — R5082 Postprocedural fever: Secondary | ICD-10-CM

## 2015-01-06 DIAGNOSIS — G8918 Other acute postprocedural pain: Secondary | ICD-10-CM | POA: Diagnosis not present

## 2015-01-06 DIAGNOSIS — F0390 Unspecified dementia without behavioral disturbance: Secondary | ICD-10-CM | POA: Diagnosis not present

## 2015-01-06 DIAGNOSIS — R600 Localized edema: Secondary | ICD-10-CM | POA: Diagnosis not present

## 2015-01-06 DIAGNOSIS — M6281 Muscle weakness (generalized): Secondary | ICD-10-CM | POA: Diagnosis not present

## 2015-01-06 DIAGNOSIS — R6510 Systemic inflammatory response syndrome (SIRS) of non-infectious origin without acute organ dysfunction: Secondary | ICD-10-CM | POA: Diagnosis not present

## 2015-01-06 DIAGNOSIS — R509 Fever, unspecified: Secondary | ICD-10-CM | POA: Diagnosis not present

## 2015-01-06 DIAGNOSIS — G9340 Encephalopathy, unspecified: Secondary | ICD-10-CM | POA: Diagnosis not present

## 2015-01-06 DIAGNOSIS — E785 Hyperlipidemia, unspecified: Secondary | ICD-10-CM | POA: Diagnosis not present

## 2015-01-06 DIAGNOSIS — H9192 Unspecified hearing loss, left ear: Secondary | ICD-10-CM | POA: Diagnosis present

## 2015-01-06 DIAGNOSIS — Z9889 Other specified postprocedural states: Secondary | ICD-10-CM | POA: Diagnosis not present

## 2015-01-06 DIAGNOSIS — E1142 Type 2 diabetes mellitus with diabetic polyneuropathy: Secondary | ICD-10-CM | POA: Diagnosis not present

## 2015-01-06 DIAGNOSIS — K219 Gastro-esophageal reflux disease without esophagitis: Secondary | ICD-10-CM | POA: Diagnosis present

## 2015-01-06 DIAGNOSIS — I82629 Acute embolism and thrombosis of deep veins of unspecified upper extremity: Secondary | ICD-10-CM | POA: Clinically undetermined

## 2015-01-06 DIAGNOSIS — G9341 Metabolic encephalopathy: Secondary | ICD-10-CM | POA: Diagnosis not present

## 2015-01-06 DIAGNOSIS — Z9181 History of falling: Secondary | ICD-10-CM | POA: Diagnosis not present

## 2015-01-06 DIAGNOSIS — K449 Diaphragmatic hernia without obstruction or gangrene: Secondary | ICD-10-CM | POA: Diagnosis not present

## 2015-01-06 DIAGNOSIS — Z7902 Long term (current) use of antithrombotics/antiplatelets: Secondary | ICD-10-CM

## 2015-01-06 DIAGNOSIS — M7989 Other specified soft tissue disorders: Secondary | ICD-10-CM | POA: Diagnosis not present

## 2015-01-06 DIAGNOSIS — M25561 Pain in right knee: Secondary | ICD-10-CM | POA: Diagnosis present

## 2015-01-06 DIAGNOSIS — Z6837 Body mass index (BMI) 37.0-37.9, adult: Secondary | ICD-10-CM | POA: Diagnosis not present

## 2015-01-06 DIAGNOSIS — M1711 Unilateral primary osteoarthritis, right knee: Secondary | ICD-10-CM | POA: Diagnosis not present

## 2015-01-06 DIAGNOSIS — D62 Acute posthemorrhagic anemia: Secondary | ICD-10-CM | POA: Diagnosis not present

## 2015-01-06 DIAGNOSIS — E119 Type 2 diabetes mellitus without complications: Secondary | ICD-10-CM

## 2015-01-06 DIAGNOSIS — F17210 Nicotine dependence, cigarettes, uncomplicated: Secondary | ICD-10-CM | POA: Diagnosis not present

## 2015-01-06 DIAGNOSIS — E11649 Type 2 diabetes mellitus with hypoglycemia without coma: Secondary | ICD-10-CM | POA: Diagnosis not present

## 2015-01-06 DIAGNOSIS — R278 Other lack of coordination: Secondary | ICD-10-CM | POA: Diagnosis not present

## 2015-01-06 DIAGNOSIS — Z79899 Other long term (current) drug therapy: Secondary | ICD-10-CM

## 2015-01-06 DIAGNOSIS — I10 Essential (primary) hypertension: Secondary | ICD-10-CM | POA: Diagnosis not present

## 2015-01-06 DIAGNOSIS — Z96651 Presence of right artificial knee joint: Secondary | ICD-10-CM | POA: Diagnosis not present

## 2015-01-06 HISTORY — PX: TOTAL KNEE ARTHROPLASTY: SHX125

## 2015-01-06 LAB — GLUCOSE, CAPILLARY
GLUCOSE-CAPILLARY: 104 mg/dL — AB (ref 65–99)
GLUCOSE-CAPILLARY: 86 mg/dL (ref 65–99)
Glucose-Capillary: 103 mg/dL — ABNORMAL HIGH (ref 65–99)
Glucose-Capillary: 99 mg/dL (ref 65–99)

## 2015-01-06 SURGERY — ARTHROPLASTY, KNEE, TOTAL
Anesthesia: General | Site: Knee | Laterality: Right

## 2015-01-06 MED ORDER — BUPIVACAINE-EPINEPHRINE (PF) 0.25% -1:200000 IJ SOLN
INTRAMUSCULAR | Status: DC | PRN
  Administered 2015-01-06: 20 mL

## 2015-01-06 MED ORDER — AMLODIPINE BESYLATE 5 MG PO TABS
5.0000 mg | ORAL_TABLET | Freq: Every day | ORAL | Status: DC
  Administered 2015-01-07 – 2015-01-14 (×8): 5 mg via ORAL
  Filled 2015-01-06 (×8): qty 1

## 2015-01-06 MED ORDER — GLIMEPIRIDE 4 MG PO TABS
4.0000 mg | ORAL_TABLET | Freq: Every day | ORAL | Status: DC
  Administered 2015-01-07 – 2015-01-08 (×2): 4 mg via ORAL
  Filled 2015-01-06 (×2): qty 1

## 2015-01-06 MED ORDER — HYDROCODONE-ACETAMINOPHEN 5-325 MG PO TABS
ORAL_TABLET | ORAL | Status: AC
  Administered 2015-01-06: 1 via ORAL
  Filled 2015-01-06: qty 1

## 2015-01-06 MED ORDER — METOCLOPRAMIDE HCL 5 MG PO TABS: 5.0000 mg | ORAL_TABLET | Freq: Three times a day (TID) | ORAL | Status: DC | PRN

## 2015-01-06 MED ORDER — PANTOPRAZOLE SODIUM 40 MG PO TBEC
40.0000 mg | DELAYED_RELEASE_TABLET | Freq: Every day | ORAL | Status: DC
  Administered 2015-01-07 – 2015-01-14 (×8): 40 mg via ORAL
  Filled 2015-01-06 (×8): qty 1

## 2015-01-06 MED ORDER — DEXTROSE 5 % IV SOLN
500.0000 mg | Freq: Four times a day (QID) | INTRAVENOUS | Status: DC | PRN
  Administered 2015-01-06: 500 mg via INTRAVENOUS
  Filled 2015-01-06 (×2): qty 5

## 2015-01-06 MED ORDER — LACTATED RINGERS IV SOLN
INTRAVENOUS | Status: DC
  Administered 2015-01-06 – 2015-01-07 (×2): 50 mL/h via INTRAVENOUS
  Administered 2015-01-07: 02:00:00 via INTRAVENOUS

## 2015-01-06 MED ORDER — GLYCOPYRROLATE 0.2 MG/ML IJ SOLN
INTRAMUSCULAR | Status: AC
  Filled 2015-01-06: qty 4

## 2015-01-06 MED ORDER — LIDOCAINE HCL (CARDIAC) 20 MG/ML IV SOLN
INTRAVENOUS | Status: DC | PRN
  Administered 2015-01-06: 40 mg via INTRAVENOUS

## 2015-01-06 MED ORDER — HYDROMORPHONE HCL 1 MG/ML IJ SOLN
INTRAMUSCULAR | Status: AC
  Administered 2015-01-06: 0.5 mg via INTRAVENOUS
  Filled 2015-01-06: qty 1

## 2015-01-06 MED ORDER — SUCCINYLCHOLINE CHLORIDE 20 MG/ML IJ SOLN
INTRAMUSCULAR | Status: AC
  Filled 2015-01-06: qty 2

## 2015-01-06 MED ORDER — BUPIVACAINE LIPOSOME 1.3 % IJ SUSP
20.0000 mL | INTRAMUSCULAR | Status: DC
  Filled 2015-01-06: qty 20

## 2015-01-06 MED ORDER — ALUM & MAG HYDROXIDE-SIMETH 200-200-20 MG/5ML PO SUSP
30.0000 mL | ORAL | Status: DC | PRN
  Administered 2015-01-11: 30 mL via ORAL
  Filled 2015-01-06: qty 30

## 2015-01-06 MED ORDER — ACETAMINOPHEN 10 MG/ML IV SOLN
INTRAVENOUS | Status: AC
  Filled 2015-01-06: qty 100

## 2015-01-06 MED ORDER — HYDROMORPHONE HCL 1 MG/ML IJ SOLN
1.0000 mg | Freq: Once | INTRAMUSCULAR | Status: AC
  Administered 2015-01-06: 0.5 mg via INTRAVENOUS

## 2015-01-06 MED ORDER — SODIUM CHLORIDE 0.9 % IR SOLN
Status: DC | PRN
  Administered 2015-01-06: 1000 mL

## 2015-01-06 MED ORDER — ONDANSETRON HCL 4 MG PO TABS: 4.0000 mg | ORAL_TABLET | Freq: Four times a day (QID) | ORAL | Status: DC | PRN

## 2015-01-06 MED ORDER — ASPIRIN EC 325 MG PO TBEC
325.0000 mg | DELAYED_RELEASE_TABLET | Freq: Two times a day (BID) | ORAL | Status: DC
  Administered 2015-01-07 – 2015-01-10 (×7): 325 mg via ORAL
  Filled 2015-01-06 (×7): qty 1

## 2015-01-06 MED ORDER — PROMETHAZINE HCL 25 MG/ML IJ SOLN
INTRAMUSCULAR | Status: AC
  Administered 2015-01-06: 12.5 mg via INTRAVENOUS
  Filled 2015-01-06: qty 1

## 2015-01-06 MED ORDER — LIDOCAINE HCL (CARDIAC) 20 MG/ML IV SOLN
INTRAVENOUS | Status: AC
  Filled 2015-01-06: qty 10

## 2015-01-06 MED ORDER — PIOGLITAZONE HCL 15 MG PO TABS
15.0000 mg | ORAL_TABLET | Freq: Every day | ORAL | Status: DC
  Administered 2015-01-06 – 2015-01-08 (×3): 15 mg via ORAL
  Filled 2015-01-06 (×3): qty 1

## 2015-01-06 MED ORDER — BUPIVACAINE HCL (PF) 0.5 % IJ SOLN
INTRAMUSCULAR | Status: DC | PRN
  Administered 2015-01-06: 30 mL via PERINEURAL

## 2015-01-06 MED ORDER — DIPHENHYDRAMINE HCL 12.5 MG/5ML PO ELIX: 12.5000 mg | ORAL_SOLUTION | ORAL | Status: DC | PRN

## 2015-01-06 MED ORDER — BUPIVACAINE-EPINEPHRINE (PF) 0.25% -1:200000 IJ SOLN
INTRAMUSCULAR | Status: AC
  Filled 2015-01-06: qty 30

## 2015-01-06 MED ORDER — LISINOPRIL 2.5 MG PO TABS
2.5000 mg | ORAL_TABLET | Freq: Two times a day (BID) | ORAL | Status: DC
  Administered 2015-01-06 – 2015-01-14 (×16): 2.5 mg via ORAL
  Filled 2015-01-06 (×16): qty 1

## 2015-01-06 MED ORDER — TRAZODONE HCL 50 MG PO TABS
50.0000 mg | ORAL_TABLET | Freq: Every day | ORAL | Status: DC
  Administered 2015-01-06 – 2015-01-13 (×7): 50 mg via ORAL
  Filled 2015-01-06 (×7): qty 1

## 2015-01-06 MED ORDER — TRANEXAMIC ACID 1000 MG/10ML IV SOLN
1000.0000 mg | INTRAVENOUS | Status: DC
  Filled 2015-01-06: qty 10

## 2015-01-06 MED ORDER — HYDROCHLOROTHIAZIDE 25 MG PO TABS
12.5000 mg | ORAL_TABLET | Freq: Every day | ORAL | Status: DC
  Administered 2015-01-07: 12.5 mg via ORAL

## 2015-01-06 MED ORDER — HYDROMORPHONE HCL 1 MG/ML IJ SOLN: 0.5000 mg | INTRAMUSCULAR | Status: DC | PRN

## 2015-01-06 MED ORDER — ONDANSETRON HCL 4 MG/2ML IJ SOLN
4.0000 mg | Freq: Four times a day (QID) | INTRAMUSCULAR | Status: DC | PRN
  Administered 2015-01-08: 4 mg via INTRAVENOUS
  Filled 2015-01-06: qty 2

## 2015-01-06 MED ORDER — MIDAZOLAM HCL 2 MG/2ML IJ SOLN
INTRAMUSCULAR | Status: AC
  Filled 2015-01-06: qty 2

## 2015-01-06 MED ORDER — FENTANYL CITRATE (PF) 100 MCG/2ML IJ SOLN
INTRAMUSCULAR | Status: DC | PRN
Start: 2015-01-06 — End: 2015-01-06
  Administered 2015-01-06: 50 ug via INTRAVENOUS
  Administered 2015-01-06 (×2): 100 ug via INTRAVENOUS

## 2015-01-06 MED ORDER — FENTANYL CITRATE (PF) 100 MCG/2ML IJ SOLN
50.0000 ug | Freq: Once | INTRAMUSCULAR | Status: AC
  Administered 2015-01-06: 50 ug via INTRAVENOUS

## 2015-01-06 MED ORDER — BISACODYL 5 MG PO TBEC: 5.0000 mg | DELAYED_RELEASE_TABLET | Freq: Every day | ORAL | Status: DC | PRN

## 2015-01-06 MED ORDER — HYDROMORPHONE HCL 1 MG/ML IJ SOLN
INTRAMUSCULAR | Status: AC
  Filled 2015-01-06: qty 1

## 2015-01-06 MED ORDER — HYDROCODONE-ACETAMINOPHEN 5-325 MG PO TABS
1.0000 | ORAL_TABLET | ORAL | Status: DC | PRN
  Administered 2015-01-06 (×2): 2 via ORAL
  Administered 2015-01-06: 1 via ORAL
  Administered 2015-01-07 (×3): 2 via ORAL
  Filled 2015-01-06 (×6): qty 2

## 2015-01-06 MED ORDER — ACETAMINOPHEN 325 MG PO TABS
650.0000 mg | ORAL_TABLET | Freq: Four times a day (QID) | ORAL | Status: DC | PRN
Start: 2015-01-06 — End: 2015-01-14
  Administered 2015-01-07 – 2015-01-10 (×5): 650 mg via ORAL
  Filled 2015-01-06 (×5): qty 2

## 2015-01-06 MED ORDER — PHENYLEPHRINE HCL 10 MG/ML IJ SOLN
INTRAMUSCULAR | Status: DC | PRN
  Administered 2015-01-06: 80 ug via INTRAVENOUS

## 2015-01-06 MED ORDER — GABAPENTIN 100 MG PO CAPS
100.0000 mg | ORAL_CAPSULE | Freq: Three times a day (TID) | ORAL | Status: DC
  Administered 2015-01-06 – 2015-01-14 (×25): 100 mg via ORAL
  Filled 2015-01-06 (×25): qty 1

## 2015-01-06 MED ORDER — ONDANSETRON HCL 4 MG/2ML IJ SOLN
INTRAMUSCULAR | Status: DC | PRN
  Administered 2015-01-06: 4 mg via INTRAVENOUS

## 2015-01-06 MED ORDER — SUCCINYLCHOLINE CHLORIDE 20 MG/ML IJ SOLN
INTRAMUSCULAR | Status: DC | PRN
  Administered 2015-01-06: 120 mg via INTRAVENOUS

## 2015-01-06 MED ORDER — SODIUM CHLORIDE 0.9 % IR SOLN
Status: DC | PRN
  Administered 2015-01-06 (×2): 1000 mL

## 2015-01-06 MED ORDER — LACTATED RINGERS IV SOLN
INTRAVENOUS | Status: DC
  Administered 2015-01-06: 09:00:00 via INTRAVENOUS

## 2015-01-06 MED ORDER — INSULIN ASPART 100 UNIT/ML ~~LOC~~ SOLN
0.0000 [IU] | Freq: Three times a day (TID) | SUBCUTANEOUS | Status: DC
  Administered 2015-01-07 – 2015-01-09 (×3): 3 [IU] via SUBCUTANEOUS

## 2015-01-06 MED ORDER — METHOCARBAMOL 500 MG PO TABS
500.0000 mg | ORAL_TABLET | Freq: Four times a day (QID) | ORAL | Status: DC | PRN
  Administered 2015-01-07 – 2015-01-14 (×6): 500 mg via ORAL
  Filled 2015-01-06 (×8): qty 1

## 2015-01-06 MED ORDER — DOCUSATE SODIUM 100 MG PO CAPS
100.0000 mg | ORAL_CAPSULE | Freq: Two times a day (BID) | ORAL | Status: DC
  Administered 2015-01-06 – 2015-01-14 (×12): 100 mg via ORAL
  Filled 2015-01-06 (×15): qty 1

## 2015-01-06 MED ORDER — ROCURONIUM BROMIDE 100 MG/10ML IV SOLN
INTRAVENOUS | Status: DC | PRN
  Administered 2015-01-06: 50 mg via INTRAVENOUS

## 2015-01-06 MED ORDER — FLUTICASONE PROPIONATE 50 MCG/ACT NA SUSP
1.0000 | Freq: Every day | NASAL | Status: DC
  Administered 2015-01-07 – 2015-01-14 (×7): 1 via NASAL
  Filled 2015-01-06: qty 16

## 2015-01-06 MED ORDER — TRANEXAMIC ACID 1000 MG/10ML IV SOLN
1000.0000 mg | INTRAVENOUS | Status: DC | PRN
  Administered 2015-01-06: 1000 mg via INTRAVENOUS

## 2015-01-06 MED ORDER — ONDANSETRON HCL 4 MG/2ML IJ SOLN
INTRAMUSCULAR | Status: AC
  Filled 2015-01-06: qty 2

## 2015-01-06 MED ORDER — PHENOL 1.4 % MT LIQD: 1.0000 | OROMUCOSAL | Status: DC | PRN

## 2015-01-06 MED ORDER — METOCLOPRAMIDE HCL 5 MG/ML IJ SOLN: 5.0000 mg | Freq: Three times a day (TID) | INTRAMUSCULAR | Status: DC | PRN

## 2015-01-06 MED ORDER — MIDAZOLAM HCL 2 MG/2ML IJ SOLN
1.0000 mg | Freq: Once | INTRAMUSCULAR | Status: AC
  Administered 2015-01-06: 13:00:00 via INTRAVENOUS

## 2015-01-06 MED ORDER — EPHEDRINE SULFATE 50 MG/ML IJ SOLN
INTRAMUSCULAR | Status: AC
  Filled 2015-01-06: qty 1

## 2015-01-06 MED ORDER — POTASSIUM CHLORIDE ER 10 MEQ PO TBCR
10.0000 meq | EXTENDED_RELEASE_TABLET | Freq: Every day | ORAL | Status: DC
  Administered 2015-01-06 – 2015-01-07 (×2): 10 meq via ORAL
  Filled 2015-01-06 (×2): qty 1

## 2015-01-06 MED ORDER — ACETAMINOPHEN 10 MG/ML IV SOLN
INTRAVENOUS | Status: DC | PRN
  Administered 2015-01-06: 1000 mg via INTRAVENOUS

## 2015-01-06 MED ORDER — ROCURONIUM BROMIDE 50 MG/5ML IV SOLN
INTRAVENOUS | Status: AC
  Filled 2015-01-06: qty 2

## 2015-01-06 MED ORDER — HYDROMORPHONE HCL 1 MG/ML IJ SOLN
INTRAMUSCULAR | Status: AC
  Administered 2015-01-06: 0.5 mg
  Filled 2015-01-06: qty 1

## 2015-01-06 MED ORDER — FENTANYL CITRATE (PF) 250 MCG/5ML IJ SOLN
INTRAMUSCULAR | Status: AC
  Filled 2015-01-06: qty 5

## 2015-01-06 MED ORDER — OXYCODONE HCL 5 MG PO TABS
ORAL_TABLET | ORAL | Status: AC
  Filled 2015-01-06: qty 1

## 2015-01-06 MED ORDER — CEFAZOLIN SODIUM-DEXTROSE 2-3 GM-% IV SOLR
2.0000 g | Freq: Four times a day (QID) | INTRAVENOUS | Status: AC
  Administered 2015-01-06 (×2): 2 g via INTRAVENOUS
  Filled 2015-01-06 (×2): qty 50

## 2015-01-06 MED ORDER — NEOSTIGMINE METHYLSULFATE 10 MG/10ML IV SOLN
INTRAVENOUS | Status: AC
  Filled 2015-01-06: qty 1

## 2015-01-06 MED ORDER — PROMETHAZINE HCL 25 MG/ML IJ SOLN
6.2500 mg | INTRAMUSCULAR | Status: DC | PRN
Start: 2015-01-06 — End: 2015-01-06
  Administered 2015-01-06: 12.5 mg via INTRAVENOUS

## 2015-01-06 MED ORDER — PROPOFOL 10 MG/ML IV BOLUS
INTRAVENOUS | Status: DC | PRN
  Administered 2015-01-06: 200 mg via INTRAVENOUS

## 2015-01-06 MED ORDER — LINAGLIPTIN 5 MG PO TABS
5.0000 mg | ORAL_TABLET | Freq: Every day | ORAL | Status: DC
  Administered 2015-01-06 – 2015-01-08 (×3): 5 mg via ORAL
  Filled 2015-01-06 (×3): qty 1

## 2015-01-06 MED ORDER — FENTANYL CITRATE (PF) 100 MCG/2ML IJ SOLN
INTRAMUSCULAR | Status: AC
  Administered 2015-01-06: 50 ug via INTRAVENOUS
  Filled 2015-01-06: qty 2

## 2015-01-06 MED ORDER — MENTHOL 3 MG MT LOZG: 1.0000 | LOZENGE | OROMUCOSAL | Status: DC | PRN

## 2015-01-06 MED ORDER — DONEPEZIL HCL 5 MG PO TABS
5.0000 mg | ORAL_TABLET | Freq: Every day | ORAL | Status: DC
  Administered 2015-01-06 – 2015-01-13 (×8): 5 mg via ORAL
  Filled 2015-01-06 (×8): qty 1

## 2015-01-06 MED ORDER — MIDAZOLAM HCL 2 MG/2ML IJ SOLN
INTRAMUSCULAR | Status: AC
  Administered 2015-01-06: 1 mg
  Filled 2015-01-06: qty 2

## 2015-01-06 MED ORDER — BUPIVACAINE LIPOSOME 1.3 % IJ SUSP
INTRAMUSCULAR | Status: DC | PRN
  Administered 2015-01-06: 20 mL

## 2015-01-06 MED ORDER — GLYCOPYRROLATE 0.2 MG/ML IJ SOLN
INTRAMUSCULAR | Status: DC | PRN
  Administered 2015-01-06: .2 mg via INTRAVENOUS
  Administered 2015-01-06: .8 mg via INTRAVENOUS

## 2015-01-06 MED ORDER — SODIUM CHLORIDE 0.9 % IJ SOLN
INTRAMUSCULAR | Status: AC
  Filled 2015-01-06: qty 10

## 2015-01-06 MED ORDER — ACETAMINOPHEN 650 MG RE SUPP: 650.0000 mg | Freq: Four times a day (QID) | RECTAL | Status: DC | PRN

## 2015-01-06 MED ORDER — SERTRALINE HCL 25 MG PO TABS
25.0000 mg | ORAL_TABLET | Freq: Every day | ORAL | Status: DC
  Administered 2015-01-07 – 2015-01-14 (×8): 25 mg via ORAL
  Filled 2015-01-06 (×9): qty 1

## 2015-01-06 MED ORDER — HYDROMORPHONE HCL 1 MG/ML IJ SOLN
0.2500 mg | INTRAMUSCULAR | Status: DC | PRN
  Administered 2015-01-06 (×4): 0.5 mg via INTRAVENOUS

## 2015-01-06 MED ORDER — PROPOFOL 10 MG/ML IV BOLUS
INTRAVENOUS | Status: AC
  Filled 2015-01-06: qty 20

## 2015-01-06 MED ORDER — LACTATED RINGERS IV SOLN
INTRAVENOUS | Status: DC | PRN
  Administered 2015-01-06 (×2): via INTRAVENOUS

## 2015-01-06 MED ORDER — NEOSTIGMINE METHYLSULFATE 10 MG/10ML IV SOLN
INTRAVENOUS | Status: DC | PRN
  Administered 2015-01-06: 4 mg via INTRAVENOUS

## 2015-01-06 SURGICAL SUPPLY — 79 items
APL SKNCLS STERI-STRIP NONHPOA (GAUZE/BANDAGES/DRESSINGS) ×1
BAG DECANTER FOR FLEXI CONT (MISCELLANEOUS) ×1 IMPLANT
BANDAGE ELASTIC 4 VELCRO ST LF (GAUZE/BANDAGES/DRESSINGS) ×1 IMPLANT
BANDAGE ESMARK 6X9 LF (GAUZE/BANDAGES/DRESSINGS) ×1 IMPLANT
BENZOIN TINCTURE PRP APPL 2/3 (GAUZE/BANDAGES/DRESSINGS) ×3 IMPLANT
BLADE SAGITTAL 25.0X1.19X90 (BLADE) IMPLANT
BLADE SAGITTAL 25.0X1.19X90MM (BLADE)
BLADE SAW SGTL 13.0X1.19X90.0M (BLADE) ×2 IMPLANT
BLADE SURG ROTATE 9660 (MISCELLANEOUS) IMPLANT
BNDG CMPR 9X6 STRL LF SNTH (GAUZE/BANDAGES/DRESSINGS) ×1
BNDG CMPR MED 10X6 ELC LF (GAUZE/BANDAGES/DRESSINGS) ×1
BNDG ELASTIC 6X10 VLCR STRL LF (GAUZE/BANDAGES/DRESSINGS) ×3 IMPLANT
BNDG ESMARK 6X9 LF (GAUZE/BANDAGES/DRESSINGS) ×3
BNDG GAUZE ELAST 4 BULKY (GAUZE/BANDAGES/DRESSINGS) ×6 IMPLANT
BOWL SMART MIX CTS (DISPOSABLE) ×3 IMPLANT
CAP KNEE TOTAL 3 SIGMA ×2 IMPLANT
CEMENT HV SMART SET (Cement) ×6 IMPLANT
CLOSURE WOUND 1/2 X4 (GAUZE/BANDAGES/DRESSINGS) ×1
COVER SURGICAL LIGHT HANDLE (MISCELLANEOUS) ×3 IMPLANT
CUFF TOURNIQUET SINGLE 34IN LL (TOURNIQUET CUFF) ×3 IMPLANT
CUFF TOURNIQUET SINGLE 44IN (TOURNIQUET CUFF) IMPLANT
DRAPE EXTREMITY T 121X128X90 (DRAPE) ×3 IMPLANT
DRAPE IMP U-DRAPE 54X76 (DRAPES) ×3 IMPLANT
DRAPE PROXIMA HALF (DRAPES) ×3 IMPLANT
DRAPE U-SHAPE 47X51 STRL (DRAPES) ×3 IMPLANT
DRSG ADAPTIC 3X8 NADH LF (GAUZE/BANDAGES/DRESSINGS) ×3 IMPLANT
DRSG PAD ABDOMINAL 8X10 ST (GAUZE/BANDAGES/DRESSINGS) ×5 IMPLANT
DURAPREP 26ML APPLICATOR (WOUND CARE) ×5 IMPLANT
ELECT REM PT RETURN 9FT ADLT (ELECTROSURGICAL) ×3
ELECTRODE REM PT RTRN 9FT ADLT (ELECTROSURGICAL) ×1 IMPLANT
GAUZE SPONGE 4X4 12PLY STRL (GAUZE/BANDAGES/DRESSINGS) ×3 IMPLANT
GLOVE BIO SURGEON STRL SZ 6.5 (GLOVE) ×1 IMPLANT
GLOVE BIO SURGEON STRL SZ8 (GLOVE) ×6 IMPLANT
GLOVE BIO SURGEONS STRL SZ 6.5 (GLOVE) ×1
GLOVE BIOGEL PI IND STRL 6.5 (GLOVE) IMPLANT
GLOVE BIOGEL PI IND STRL 7.0 (GLOVE) IMPLANT
GLOVE BIOGEL PI IND STRL 8 (GLOVE) ×2 IMPLANT
GLOVE BIOGEL PI INDICATOR 6.5 (GLOVE) ×2
GLOVE BIOGEL PI INDICATOR 7.0 (GLOVE) ×2
GLOVE BIOGEL PI INDICATOR 8 (GLOVE) ×4
GLOVE SURG SS PI 6.5 STRL IVOR (GLOVE) ×3 IMPLANT
GLOVE SURG SS PI 7.0 STRL IVOR (GLOVE) ×2 IMPLANT
GLOVE SURG SS PI 7.5 STRL IVOR (GLOVE) ×4 IMPLANT
GOWN STRL REUS W/ TWL LRG LVL3 (GOWN DISPOSABLE) ×2 IMPLANT
GOWN STRL REUS W/ TWL XL LVL3 (GOWN DISPOSABLE) ×2 IMPLANT
GOWN STRL REUS W/TWL 2XL LVL3 (GOWN DISPOSABLE) ×2 IMPLANT
GOWN STRL REUS W/TWL LRG LVL3 (GOWN DISPOSABLE) ×6
GOWN STRL REUS W/TWL XL LVL3 (GOWN DISPOSABLE) ×6
HANDPIECE INTERPULSE COAX TIP (DISPOSABLE) ×3
HOOD PEEL AWAY FACE SHEILD DIS (HOOD) ×8 IMPLANT
IMMOBILIZER KNEE 20 (SOFTGOODS) IMPLANT
IMMOBILIZER KNEE 22 UNIV (SOFTGOODS) ×1 IMPLANT
IMMOBILIZER KNEE 24 THIGH 36 (MISCELLANEOUS) IMPLANT
IMMOBILIZER KNEE 24 UNIV (MISCELLANEOUS) ×3
KIT BASIN OR (CUSTOM PROCEDURE TRAY) ×3 IMPLANT
KIT ROOM TURNOVER OR (KITS) ×3 IMPLANT
MANIFOLD NEPTUNE II (INSTRUMENTS) ×3 IMPLANT
NDL HYPO 21X1 ECLIPSE (NEEDLE) ×1 IMPLANT
NEEDLE 22X1 1/2 (OR ONLY) (NEEDLE) ×3 IMPLANT
NEEDLE HYPO 21X1 ECLIPSE (NEEDLE) IMPLANT
NS IRRIG 1000ML POUR BTL (IV SOLUTION) ×3 IMPLANT
PACK TOTAL JOINT (CUSTOM PROCEDURE TRAY) ×3 IMPLANT
PACK UNIVERSAL I (CUSTOM PROCEDURE TRAY) ×3 IMPLANT
PAD ARMBOARD 7.5X6 YLW CONV (MISCELLANEOUS) ×6 IMPLANT
SET HNDPC FAN SPRY TIP SCT (DISPOSABLE) ×1 IMPLANT
STAPLER VISISTAT 35W (STAPLE) IMPLANT
STRIP CLOSURE SKIN 1/2X4 (GAUZE/BANDAGES/DRESSINGS) ×2 IMPLANT
SUCTION FRAZIER TIP 10 FR DISP (SUCTIONS) IMPLANT
SUT MNCRL AB 3-0 PS2 18 (SUTURE) ×2 IMPLANT
SUT VIC AB 0 CT1 27 (SUTURE) ×6
SUT VIC AB 0 CT1 27XBRD ANBCTR (SUTURE) ×2 IMPLANT
SUT VIC AB 2-0 CT1 27 (SUTURE) ×6
SUT VIC AB 2-0 CT1 TAPERPNT 27 (SUTURE) ×2 IMPLANT
SUT VLOC 180 0 24IN GS25 (SUTURE) ×3 IMPLANT
SYR 50ML LL SCALE MARK (SYRINGE) ×3 IMPLANT
TOWEL OR 17X24 6PK STRL BLUE (TOWEL DISPOSABLE) ×3 IMPLANT
TOWEL OR 17X26 10 PK STRL BLUE (TOWEL DISPOSABLE) ×3 IMPLANT
TRAY FOLEY CATH 14FR (SET/KITS/TRAYS/PACK) ×2 IMPLANT
UPCHARGE REV TRAY MBT KNEE ×2 IMPLANT

## 2015-01-06 NOTE — Op Note (Signed)
PREOP DIAGNOSIS: DJD RIGHT KNEE POSTOP DIAGNOSIS: same PROCEDURE: RIGHT TKR ANESTHESIA: General and block ATTENDING SURGEON: Lavin Petteway G ASSISTANT: Elodia Florence PA  INDICATIONS FOR PROCEDURE: Andrew Winters is a 74 y.o. male who has struggled for a long time with pain due to degenerative arthritis of the right knee.  The patient has failed many conservative non-operative measures and at this point has pain which limits the ability to sleep and walk.  The patient is offered total knee replacement.  Informed operative consent was obtained after discussion of possible risks of anesthesia, infection, neurovascular injury, DVT, and death.  The importance of the post-operative rehabilitation protocol to optimize result was stressed extensively with the patient.  SUMMARY OF FINDINGS AND PROCEDURE:  Andrew Winters was taken to the operative suite where under the above anesthesia a right knee replacement was performed.  There were advanced degenerative changes and the bone quality was excellent.  We used the DePuy LCS system and placed size large femur, 5 MBT tibia, 38 mm all polyethylene patella, and a size 12.5 mm spacer.  Elodia Florence PA-C assisted throughout and was invaluable to the completion of the case in that he helped retract and maintain exposure while I placed components.  He also helped close thereby minimizing OR time.  The patient was admitted for appropriate post-op care to include perioperative antibiotics and mechanical and pharmacologic measures for DVT prophylaxis.  DESCRIPTION OF PROCEDURE:  Andrew Winters was taken to the operative suite where the above anesthesia was applied.  The patient was positioned supine and prepped and draped in normal sterile fashion.  An appropriate time out was performed.  After the administration of kefzol pre-op antibiotic the leg was elevated and exsanguinated and a tourniquet inflated. A standard longitudinal incision was made on the anterior knee.   Dissection was carried down to the extensor mechanism.  All appropriate anti-infective measures were used including the pre-operative antibiotic, betadine impregnated drape, and closed hooded exhaust systems for each member of the surgical team.  A medial parapatellar incision was made in the extensor mechanism and the knee cap flipped and the knee flexed.  Some residual meniscal tissues were removed along with any remaining ACL/PCL tissue.  A guide was placed on the tibia and a flat cut was made on it's superior surface.  An intramedullary guide was placed in the femur and was utilized to make anterior and posterior cuts creating an appropriate flexion gap.  A second intramedullary guide was placed in the femur to make a distal cut properly balancing the knee with an extension gap equal to the flexion gap.  The three bones sized to the above mentioned sizes and the appropriate guides were placed and utilized.  A trial reduction was done and the knee easily came to full extension and the patella tracked well on flexion.  The trial components were removed and all bones were cleaned with pulsatile lavage and then dried thoroughly.  Cement was mixed and was pressurized onto the bones followed by placement of the aforementioned components.  Excess cement was trimmed and pressure was held on the components until the cement had hardened.  The tourniquet was deflated and a small amount of bleeding was controlled with cautery and pressure.  The knee was irrigated thoroughly.  The extensor mechanism was re-approximated with V-loc suture in running fashion.  The knee was flexed and the repair was solid.  The subcutaneous tissues were re-approximated with #0 and #2-0 vicryl and the skin closed with a subcuticular stitch  and steristrips.  A sterile dressing was applied.  Intraoperative fluids, EBL, and tourniquet time can be obtained from anesthesia records.  DISPOSITION:  The patient was taken to recovery room in stable  condition and admitted for appropriate post-op care to include peri-operative antibiotic and DVT prophylaxis with mechanical and pharmacologic measures.  Essica Kiker G 01/06/2015, 11:21 AM

## 2015-01-06 NOTE — Anesthesia Preprocedure Evaluation (Addendum)
Anesthesia Evaluation  Patient identified by MRN, date of birth, ID band Patient awake    Reviewed: Allergy & Precautions, NPO status , Patient's Chart, lab work & pertinent test results  Airway Mallampati: III  TM Distance: >3 FB Neck ROM: full    Dental  (+) Teeth Intact, Dental Advidsory Given   Pulmonary pneumonia -, Current Smoker,  breath sounds clear to auscultation        Cardiovascular hypertension, On Medications Rhythm:Regular Rate:Normal     Neuro/Psych  Headaches,  Neuromuscular disease    GI/Hepatic hiatal hernia, GERD-  Medicated and Controlled,  Endo/Other  diabetes, Type 2, Oral Hypoglycemic AgentsHyperthyroidism   Renal/GU      Musculoskeletal   Abdominal (+) + obese,   Peds  Hematology   Anesthesia Other Findings   Reproductive/Obstetrics                          Anesthesia Physical Anesthesia Plan  ASA: III  Anesthesia Plan: General   Post-op Pain Management:    Induction: Intravenous  Airway Management Planned:   Additional Equipment:   Intra-op Plan:   Post-operative Plan: Extubation in OR  Informed Consent: I have reviewed the patients History and Physical, chart, labs and discussed the procedure including the risks, benefits and alternatives for the proposed anesthesia with the patient or authorized representative who has indicated his/her understanding and acceptance.   Dental advisory given and Dental Advisory Given  Plan Discussed with: CRNA, Surgeon and Anesthesiologist  Anesthesia Plan Comments: (Hx of back pain and back surgery prefers Gen anesth)      Anesthesia Quick Evaluation

## 2015-01-06 NOTE — Progress Notes (Signed)
Orthopedic Tech Progress Note Patient Details:  Andrew Winters [birth year] [id number] CPM applied to RLE with appropriate settings. OHF applied to bed. CPM Right Knee CPM Right Knee: On Right Knee Flexion (Degrees): 90 Right Knee Extension (Degrees): 0   [name] 01/06/2015, 1:01 PM

## 2015-01-06 NOTE — Progress Notes (Signed)
Patient experiencing uncontroloed pain with no relief from ordered medication. Patient now experiencing nausea. Dr Jacklynn Bue notified; orders received. IV in right arm; Upon admission IV noticed to be positional with arm flexed; once arm extended IV able to run on pump without complication. As I went to administer phenergan IV the IV site presented with slight swelling and pain to touch. IV d/c'd and anesthesia notified.

## 2015-01-06 NOTE — Interval H&P Note (Signed)
OK for surgery PD 

## 2015-01-06 NOTE — Care Management (Signed)
Utilization review completed. Simonne Martinet, RN BSN [phone]

## 2015-01-06 NOTE — Transfer of Care (Signed)
Immediate Anesthesia Transfer of Care Note  Patient: Andrew Winters  Procedure(s) Performed: Procedure(s): TOTAL KNEE ARTHROPLASTY (Right)  Patient Location: PACU  Anesthesia Type:General  Level of Consciousness: awake, alert  and oriented  Airway & Oxygen Therapy: Patient Spontanous Breathing and Patient connected to face mask oxygen  Post-op Assessment: Report given to RN, Post -op Vital signs reviewed and stable and Patient moving all extremities X 4  Post vital signs: Reviewed and stable  Last Vitals:  Filed Vitals:   01/06/15 0920  BP: 119/65  Pulse: 78  Temp:   Resp: 19    Complications: No apparent anesthesia complications

## 2015-01-06 NOTE — Anesthesia Procedure Notes (Addendum)
Anesthesia Regional Block:  Femoral nerve block  Pre-Anesthetic Checklist: ,, timeout performed, Correct Patient, Correct Site, Correct Laterality, Correct Procedure, Correct Position, site marked, Risks and benefits discussed,  Surgical consent,  Pre-op evaluation,  At surgeon's request and post-op pain management   Prep: chloraprep       Needles:  Injection technique: Single-shot  Needle Type: Echogenic Stimulator Needle     Needle Length: 9cm 9 cm Needle Gauge: 22 and 22 G  Needle insertion depth: 7 cm   Additional Needles:  Procedures: ultrasound guided (picture in chart) and nerve stimulator Femoral nerve block  Nerve Stimulator or Paresthesia:  Response: Twitch elicited, 0.8 mA,   Additional Responses:   Narrative:  Start time: 01/06/2015 8:55 AM End time: 01/06/2015 9:10 AM Injection made incrementally with aspirations every 5 mL.  Performed by: Personally  Anesthesiologist: MASSAGEE, TERRY  Additional Notes: BP cuff, EKG monitors applied. Sedation begun. Femoral artery palpated for location of nerve. After nerve location anesthetic injected incrementally, slowly , and after neg aspirations. Tolerated well.   Procedure Name: Intubation Date/Time: 01/06/2015 9:48 AM Performed by: Carmela Rima Pre-anesthesia Checklist: Timeout performed, Patient being monitored, Suction available, Emergency Drugs available and Patient identified Patient Re-evaluated:Patient Re-evaluated prior to inductionOxygen Delivery Method: Circle system utilized Preoxygenation: Pre-oxygenation with 100% oxygen Intubation Type: IV induction Ventilation: Mask ventilation without difficulty and Two handed mask ventilation required Tube type: Oral Tube size: 7.5 mm Number of attempts: 1 Airway Equipment and Method: Video-laryngoscopy Placement Confirmation: positive ETCO2,  breath sounds checked- equal and bilateral and ETT inserted through vocal cords under direct vision Secured at: 24  cm Tube secured with: Tape Dental Injury: Teeth and Oropharynx as per pre-operative assessment

## 2015-01-07 ENCOUNTER — Encounter (HOSPITAL_COMMUNITY): Payer: Self-pay | Admitting: Orthopaedic Surgery

## 2015-01-07 ENCOUNTER — Inpatient Hospital Stay (HOSPITAL_COMMUNITY): Payer: Medicare Other

## 2015-01-07 DIAGNOSIS — R6510 Systemic inflammatory response syndrome (SIRS) of non-infectious origin without acute organ dysfunction: Secondary | ICD-10-CM | POA: Diagnosis present

## 2015-01-07 DIAGNOSIS — I10 Essential (primary) hypertension: Secondary | ICD-10-CM | POA: Diagnosis present

## 2015-01-07 DIAGNOSIS — E119 Type 2 diabetes mellitus without complications: Secondary | ICD-10-CM

## 2015-01-07 DIAGNOSIS — G9340 Encephalopathy, unspecified: Secondary | ICD-10-CM | POA: Insufficient documentation

## 2015-01-07 LAB — BASIC METABOLIC PANEL
Anion gap: 9 (ref 5–15)
BUN: 13 mg/dL (ref 6–20)
CO2: 28 mmol/L (ref 22–32)
CREATININE: 0.93 mg/dL (ref 0.61–1.24)
Calcium: 8.3 mg/dL — ABNORMAL LOW (ref 8.9–10.3)
Chloride: 101 mmol/L (ref 101–111)
GFR calc Af Amer: >60 mL/min (ref >60)
GFR calc non Af Amer: >60 mL/min (ref >60)
GLUCOSE: 110 mg/dL — AB (ref 65–99)
POTASSIUM: 4.4 mmol/L (ref 3.5–5.1)
Sodium: 138 mmol/L (ref 135–145)

## 2015-01-07 LAB — GLUCOSE, CAPILLARY
Glucose-Capillary: 100 mg/dL — ABNORMAL HIGH (ref 65–99)
Glucose-Capillary: 132 mg/dL — ABNORMAL HIGH (ref 65–99)
Glucose-Capillary: 80 mg/dL (ref 65–99)
Glucose-Capillary: 90 mg/dL (ref 65–99)

## 2015-01-07 LAB — CBC
HCT: 35.2 % — ABNORMAL LOW (ref 39.0–52.0)
Hemoglobin: 11.3 g/dL — ABNORMAL LOW (ref 13.0–17.0)
MCH: 27.5 pg (ref 26.0–34.0)
MCHC: 32.1 g/dL (ref 30.0–36.0)
MCV: 85.6 fL (ref 78.0–100.0)
PLATELETS: 159 10*3/uL (ref 150–400)
RBC: 4.11 MIL/uL — ABNORMAL LOW (ref 4.22–5.81)
RDW: 17 % — AB (ref 11.5–15.5)
WBC: 5.5 10*3/uL (ref 4.0–10.5)

## 2015-01-07 MED ORDER — PIPERACILLIN-TAZOBACTAM 3.375 G IVPB
3.3750 g | Freq: Three times a day (TID) | INTRAVENOUS | Status: DC
  Administered 2015-01-08 – 2015-01-12 (×14): 3.375 g via INTRAVENOUS
  Filled 2015-01-07 (×15): qty 50

## 2015-01-07 MED ORDER — PIPERACILLIN-TAZOBACTAM 3.375 G IVPB 30 MIN
3.3750 g | Freq: Once | INTRAVENOUS | Status: AC
  Administered 2015-01-07: 3.375 g via INTRAVENOUS
  Filled 2015-01-07: qty 50

## 2015-01-07 MED ORDER — VANCOMYCIN HCL IN DEXTROSE 1-5 GM/200ML-% IV SOLN
1000.0000 mg | Freq: Two times a day (BID) | INTRAVENOUS | Status: DC
  Administered 2015-01-08 – 2015-01-10 (×5): 1000 mg via INTRAVENOUS
  Filled 2015-01-07 (×5): qty 200

## 2015-01-07 MED ORDER — SODIUM CHLORIDE 0.9 % IV SOLN
INTRAVENOUS | Status: AC
  Administered 2015-01-07: 100 mL/h via INTRAVENOUS

## 2015-01-07 MED ORDER — SODIUM CHLORIDE 0.9 % IV SOLN
2000.0000 mg | INTRAVENOUS | Status: AC
Start: 2015-01-07 — End: 2015-01-08
  Administered 2015-01-08: 2000 mg via INTRAVENOUS
  Filled 2015-01-07: qty 2000

## 2015-01-07 MED ORDER — VANCOMYCIN HCL IN DEXTROSE 1-5 GM/200ML-% IV SOLN: 1000.0000 mg | Freq: Once | INTRAVENOUS | Status: DC

## 2015-01-07 NOTE — Progress Notes (Signed)
Pt sl confused trying to get oob had voided lg amt incont in his sleep took self off cpm.reoriented pt he apologized eating late supper.

## 2015-01-07 NOTE — Consult Note (Signed)
Reason for Consult:Fever. Referring Physician: Ms.Danielle. PA.  Andrew Winters is an 74 y.o. male.  HPI: With history of diabetes mellitus type 2, hypertension, dementia who has had right knee replacement yesterday was found to be confused and febrile since this afternoon. Patient was spiking fever with temperatures around 103F. Orthopedic service has done chest x-ray which has not shown anything acute. Patient is found to be confused and only oriented to his name. Since patient's fever and confusion is persistent medical service was consulted for further management. Patient is also tachycardic. On exam patient is not in acute distress and is oriented to his name only. As per the nurse who took care of him yesterday felt that the patient was more oriented than today and patient has baseline dementia and his baseline mental status is not known and family is unable to be reached at this time. Patient follows commands and moves all extremities. Right lower extremity has the brace from recent surgery and chest exam and abdomen exam appears benign.  Past Medical History  Diagnosis Date  . Bilateral swelling of feet   . Headaches, cluster   . Anxiety   . Fatigue   . Rheumatoid arthritis(714.0)   . Muscle pain   . Hiatal hernia   . Vitamin D deficiency     takes Vit D daily  . BPH (benign prostatic hyperplasia)     no meds needed per medical md  . Osteoarthritis   . Hyperthyroidism   . Deafness     in left ear   . Hyperlipemia     was on meds but hasnt been on anything in about 49month per MD  . Presbyesophagus   . Insomnia     takes Trazodone nightly  . Depression     takes Zoloft daily  . Dysphagia   . Hypertension     takes Amlodipine,HCTZ,and Lisinopril daily  . Pneumonia 3+ yrs ago  . Peripheral neuropathy     takes Gabapentin daily  . Joint pain   . Joint swelling   . GERD (gastroesophageal reflux disease)     takes Omeprazole daily  . Chronic diarrhea   . Nocturia   .  Diabetes mellitus     takes Actos and Amaryl daily as well as Onglyza  . Cataracts, bilateral     immature    Past Surgical History  Procedure Laterality Date  . Back surgery  1998  . Colonoscopy with esophagogastroduodenoscopy (egd) and esophageal dilation (ed)    . Total knee arthroplasty Right 01/06/2015    Procedure: TOTAL KNEE ARTHROPLASTY;  Surgeon: PMelrose Nakayama MD;  [facility]  Service: Orthopedics;  Laterality: Right;    Family History  Problem Relation Age of Onset  . Heart disease Mother   . Diabetes Mother   . Heart attack Father   . Colon cancer Neg Hx   . Diabetes Brother     Social History:  reports that he has been smoking Cigarettes.  He has a 35 pack-year smoking history. He has never used smokeless tobacco. He reports that he does not drink alcohol or use illicit drugs.  Allergies:  Allergies  Allergen Reactions  . Aspirin Diarrhea    Medications: I have reviewed the patient's current medications.  Results for orders placed or performed during the hospital encounter of 01/06/15 (from the past 48 hour(s))  Glucose, capillary     Status: Abnormal   Collection Time: 01/06/15  8:17 AM  Result Value Ref Range   Glucose-Capillary  103 (H) 65 - 99 mg/dL  Glucose, capillary     Status: Abnormal   Collection Time: 01/06/15 11:57 AM  Result Value Ref Range   Glucose-Capillary 104 (H) 65 - 99 mg/dL   Comment 1 Notify RN    Comment 2 Document in Chart   Glucose, capillary     Status: None   Collection Time: 01/06/15  4:16 PM  Result Value Ref Range   Glucose-Capillary 86 65 - 99 mg/dL   Comment 1 Notify RN   Glucose, capillary     Status: None   Collection Time: 01/06/15  9:17 PM  Result Value Ref Range   Glucose-Capillary 99 65 - 99 mg/dL   Comment 1 Notify RN   CBC     Status: Abnormal   Collection Time: 01/07/15  4:44 AM  Result Value Ref Range   WBC 5.5 4.0 - 10.5 K/uL   RBC 4.11 (L) 4.22 - 5.81 MIL/uL   Hemoglobin 11.3 (L) 13.0 - 17.0 g/dL    HCT 35.2 (L) 39.0 - 52.0 %   MCV 85.6 78.0 - 100.0 fL   MCH 27.5 26.0 - 34.0 pg   MCHC 32.1 30.0 - 36.0 g/dL   RDW 17.0 (H) 11.5 - 15.5 %   Platelets 159 150 - 400 K/uL  Basic metabolic panel     Status: Abnormal   Collection Time: 01/07/15  4:44 AM  Result Value Ref Range   Sodium 138 135 - 145 mmol/L   Potassium 4.4 3.5 - 5.1 mmol/L   Chloride 101 101 - 111 mmol/L   CO2 28 22 - 32 mmol/L   Glucose, Bld 110 (H) 65 - 99 mg/dL   BUN 13 6 - 20 mg/dL   Creatinine, Ser 0.93 0.61 - 1.24 mg/dL   Calcium 8.3 (L) 8.9 - 10.3 mg/dL   GFR calc non Af Amer >60 >60 mL/min   GFR calc Af Amer >60 >60 mL/min    Comment: (NOTE) The eGFR has been calculated using the CKD EPI equation. This calculation has not been validated in all clinical situations. eGFR's persistently <60 mL/min signify possible Chronic Kidney Disease.    Anion gap 9 5 - 15  Glucose, capillary     Status: Abnormal   Collection Time: 01/07/15  6:24 AM  Result Value Ref Range   Glucose-Capillary 100 (H) 65 - 99 mg/dL   Comment 1 Notify RN   Glucose, capillary     Status: Abnormal   Collection Time: 01/07/15 11:09 AM  Result Value Ref Range   Glucose-Capillary 132 (H) 65 - 99 mg/dL   Comment 1 Notify RN    Comment 2 Document in Chart   Glucose, capillary     Status: None   Collection Time: 01/07/15  4:57 PM  Result Value Ref Range   Glucose-Capillary 80 65 - 99 mg/dL  Glucose, capillary     Status: None   Collection Time: 01/07/15  9:48 PM  Result Value Ref Range   Glucose-Capillary 90 65 - 99 mg/dL   Comment 1 Notify RN     Dg Chest 1 View  01/07/2015   CLINICAL DATA:  Disoriented.  Wheezing.  EXAM: CHEST  1 VIEW  COMPARISON:  12/25/2014  FINDINGS: There is mild unchanged cardiomegaly. The lungs are grossly clear. There is no large effusion. There is no pneumothorax.  IMPRESSION: No acute cardiopulmonary findings.   Electronically Signed   By: Andreas Newport M.D.   On: 01/07/2015 21:32    Review  of Systems   Constitutional: Positive for fever.  HENT: Negative.   Eyes: Negative.   Respiratory: Negative.   Cardiovascular: Negative.   Genitourinary: Negative.   Musculoskeletal: Negative.   Skin: Negative.   Neurological: Negative.   Endo/Heme/Allergies: Negative.   Psychiatric/Behavioral: Negative.    Blood pressure 115/66, pulse 116, temperature 103.4 F (39.7 C), temperature source Rectal, resp. rate 18, weight 127.461 kg (281 lb), SpO2 97 %. Physical Exam  Constitutional: He appears well-developed and well-nourished. No distress.  HENT:  Head: Normocephalic and atraumatic.  Eyes: Conjunctivae are normal. Pupils are equal, round, and reactive to light. Right eye exhibits no discharge. Left eye exhibits no discharge.  Neck: Normal range of motion. Neck supple.  Cardiovascular: Normal rate and regular rhythm.   Respiratory: Effort normal and breath sounds normal. No respiratory distress. He has no wheezes.  GI: Soft. Bowel sounds are normal. He exhibits no distension. There is no tenderness. There is no rebound.  Musculoskeletal:  Right leg in brace.  Neurological: He is alert.  Oriented to name only. Moves all extremities.  Skin: Skin is warm. He is not diaphoretic.    Assessment/Plan: #1. SIRS with possible developing sepsis source not clear - I have ordered blood cultures and lactic acid levels procalcitonin levels and place patient on sepsis protocol with fluid and empiric antibiotics. I have also ordered urinalysis and urine culture. Closely observe. If no definite source for infection is seen then we will need to look into the right knee which has had recent surgery. #2. Acute encephalopathy with history of dementia - baseline mental status not clear. Patient at this time is only oriented to his name. As per the nurse who took care of him yesterday patient was more oriented than today. Patient's mental status most likely secondary to sepsis and fever. CT head has been ordered.  Closely observe. #3. Hypertension - HCTZ is already on hold. Closely observe blood pressure trends and its is any drop in blood pressure and may have to hold antihypertensives. #4. Diabetes mellitus type 2 - continue present medications with sliding scale coverage. Closely observe for any hypoglycemia. #5. Recent right knee surgery - per orthopedics.  I have ordered repeat metabolic panel CBC which are pending at this time.  Thanks for involving [place] in patient's care. We will follow along with you.    Rise Patience 01/07/2015, 10:48 PM

## 2015-01-07 NOTE — Progress Notes (Signed)
ANTIBIOTIC CONSULT NOTE - INITIAL  Pharmacy Consult for Vancomycin and Zosyn Indication: sepsis  Allergies  Allergen Reactions  . Aspirin Diarrhea    Patient Measurements: Weight: 281 lb (127.461 kg)  Vital Signs: Temp: 103.4 F (39.7 C) (06/29 2142) Temp Source: Rectal (06/29 2142) BP: 115/66 mmHg (06/29 2156) Pulse Rate: 116 (06/29 2011) Intake/Output from previous day: 06/28 0701 - 06/29 0700 In: 1450 [I.V.:1450] Out: 2000 [Urine:1850; Blood:150] Intake/Output from this shift:    Labs:  Recent Labs  01/07/15 0444  WBC 5.5  HGB 11.3*  PLT 159  CREATININE 0.93   Estimated Creatinine Clearance: 99 mL/min (by C-G formula based on Cr of 0.93). No results for input(s): VANCOTROUGH, VANCOPEAK, VANCORANDOM, GENTTROUGH, GENTPEAK, GENTRANDOM, TOBRATROUGH, TOBRAPEAK, TOBRARND, AMIKACINPEAK, AMIKACINTROU, AMIKACIN in the last 72 hours.   Microbiology: Recent Results (from the past 720 hour(s))  Surgical pcr screen     Status: None   Collection Time: 12/25/14  2:04 PM  Result Value Ref Range Status   MRSA, PCR NEGATIVE NEGATIVE Final   Staphylococcus aureus NEGATIVE NEGATIVE Final    Comment:        The Xpert SA Assay (FDA approved for NASAL specimens in patients over 31 years of age), is one component of a comprehensive surveillance program.  Test performance has been validated by [facility] for patients greater than or equal to 54 year old. It is not intended to diagnose infection nor to guide or monitor treatment.     Medical History: Past Medical History  Diagnosis Date  . Bilateral swelling of feet   . Headaches, cluster   . Anxiety   . Fatigue   . Rheumatoid arthritis(714.0)   . Muscle pain   . Hiatal hernia   . Vitamin D deficiency     takes Vit D daily  . BPH (benign prostatic hyperplasia)     no meds needed per medical md  . Osteoarthritis   . Hyperthyroidism   . Deafness     in left ear   . Hyperlipemia     was on meds but hasnt been on  anything in about 79months per MD  . Presbyesophagus   . Insomnia     takes Trazodone nightly  . Depression     takes Zoloft daily  . Dysphagia   . Hypertension     takes Amlodipine,HCTZ,and Lisinopril daily  . Pneumonia 3+ yrs ago  . Peripheral neuropathy     takes Gabapentin daily  . Joint pain   . Joint swelling   . GERD (gastroesophageal reflux disease)     takes Omeprazole daily  . Chronic diarrhea   . Nocturia   . Diabetes mellitus     takes Actos and Amaryl daily as well as Onglyza  . Cataracts, bilateral     immature    Medications:  Facility-administered medications prior to admission  Medication Dose Route Frequency Provider Last Rate Last Dose  . 0.9 %  sodium chloride infusion  500 mL Intravenous Continuous Hart Carwin, MD       Prescriptions prior to admission  Medication Sig Dispense Refill Last Dose  . acetaminophen (TYLENOL) 500 MG tablet Take 500 mg by mouth every 6 (six) hours as needed for mild pain or moderate pain.   01/05/2015 at Unknown time  . amLODipine (NORVASC) 5 MG tablet Take 5 mg by mouth daily.     01/06/2015 at 0630  . Cholecalciferol (VITAMIN D) 1000 UNITS capsule Take 1,000 Units by mouth 2 (two)  times daily.     01/05/2015 at Unknown time  . donepezil (ARICEPT) 5 MG tablet Take 5 mg by mouth at bedtime.    01/05/2015 at Unknown time  . fluticasone (FLONASE) 50 MCG/ACT nasal spray Place 1 spray into both nostrils daily.   01/06/2015 at 0630  . gabapentin (NEURONTIN) 100 MG capsule Take 100 mg by mouth 3 (three) times daily.     01/06/2015 at 0630  . glimepiride (AMARYL) 4 MG tablet Take 4 mg by mouth daily before breakfast.     01/05/2015 at Unknown time  . hydrochlorothiazide (HYDRODIURIL) 25 MG tablet Take 12.5 mg by mouth daily at 12 noon.   01/05/2015 at Unknown time  . lisinopril (PRINIVIL,ZESTRIL) 2.5 MG tablet Take 2.5 mg by mouth 2 (two) times daily.    01/05/2015 at Unknown time  . meloxicam (MOBIC) 15 MG tablet Take 15 mg by mouth daily.     01/05/2015 at Unknown time  . Omeprazole 20 MG TBEC Take 20 mg by mouth daily at 12 noon.    01/06/2015 at 0630  . ONGLYZA 2.5 MG TABS tablet Take 2.5 mg by mouth daily.    01/05/2015 at Unknown time  . pioglitazone (ACTOS) 15 MG tablet Take 15 mg by mouth daily.    01/05/2015 at Unknown time  . potassium chloride (K-DUR) 10 MEQ tablet Take 10 mEq by mouth daily.    01/05/2015 at Unknown time  . sertraline (ZOLOFT) 25 MG tablet Take 25 mg by mouth daily.    01/06/2015 at 0630  . traMADol (ULTRAM) 50 MG tablet Take 50 mg by mouth 2 (two) times daily.    Past Month at Unknown time  . traZODone (DESYREL) 50 MG tablet Take 50 mg by mouth at bedtime.   01/05/2015 at Unknown time  . hydrochlorothiazide (,MICROZIDE/HYDRODIURIL,) 12.5 MG capsule TAKE ONE CAPSULE BY MOUTH EVERY DAY (Patient not taking: Reported on 12/24/2014) 30 capsule 0 Not Taking at Unknown time  . metFORMIN (GLUCOPHAGE) 1000 MG tablet TAKE ONE TABLET BY MOUTH IN THE MORNING AND ONE & ONE-HALF IN THE EVENING (Patient not taking: Reported on 12/24/2014) 75 tablet 0 Not Taking at Unknown time   Assessment: 74 y.o. male s/p R TKR 6/28. Pt spiked temperature of 103.4 tonight. HR up to 116. WBC wnl. To begin broad spectrum abx (Vancomcyin and Zosyn) for r/o sepsis. Estimated normalized CrCl 70-75 ml/min. Cultures sent.  Goal of Therapy:  Vancomycin trough level 15-20 mcg/ml  Plan:  Zosyn 3.375gm IV now over 30 min then 3.375gm IV q8h - subsequent doses over 4 hours Vancomycin 2gm IV now then 1gm IV q12h Vanc trough at Css in obese pt Will f/u renal function, pt's clinical condition, fever curve, and micro data  Christoper Fabian, PharmD, BCPS Clinical pharmacist, pager [phone] 01/07/2015,11:20 PM

## 2015-01-07 NOTE — Evaluation (Signed)
Physical Therapy Evaluation Patient Details Name: Andrew Winters MRN: [id number] DOB: 04-16-1941 Today's Date: 01/07/2015   History of Present Illness  R TKA  Clinical Impression  Pt is s/p TKA resulting in the deficits listed below (see PT Problem List).  Pt will benefit from skilled PT to increase their independence and safety with mobility to allow discharge home.       Follow Up Recommendations Home health PT    Equipment Recommendations  Rolling walker with 5" wheels (extra wide bariatric walker for 300 pound plus.)    Recommendations for Other Services       Precautions / Restrictions Precautions Precautions: Fall;Knee Precaution Comments: Reviewed no pillow under knee Required Braces or Orthoses: Knee Immobilizer - Right Knee Immobilizer - Right:  (not specified) Restrictions Weight Bearing Restrictions: Yes RLE Weight Bearing: Weight bearing as tolerated      Mobility  Bed Mobility Overal bed mobility: Needs Assistance Bed Mobility: Supine to Sit;Sit to Supine     Supine to sit: Mod assist Sit to supine: Mod assist   General bed mobility comments: requiring assits with Rt leg in/out of bed as well as additional support of trunk.   Transfers Overall transfer level: Needs assistance Equipment used: Rolling walker (2 wheeled) Transfers: Sit to/from Stand Sit to Stand: Mod assist;From elevated surface         General transfer comment: stand turn to chair  Ambulation/Gait                Stairs            Wheelchair Mobility    Modified Rankin (Stroke Patients Only)       Balance Overall balance assessment: Needs assistance Sitting-balance support: Bilateral upper extremity supported;Feet supported Sitting balance-Leahy Scale: Fair     Standing balance support: Bilateral upper extremity supported Standing balance-Leahy Scale: Fair                               Pertinent Vitals/Pain Pain Assessment: 0-10 Pain Score:  9  Pain Location: rt knee Pain Descriptors / Indicators: Aching Pain Intervention(s): Limited activity within patient's tolerance;Monitored during session    Home Living Family/patient expects to be discharged to:: Private residence Living Arrangements: Spouse/significant other Available Help at Discharge: Family Type of Home: House Home Access: Level entry   Entrance Stairs-Number of Steps:  (patient denies any steps into home) Home Layout: One level Home Equipment: None      Prior Function Level of Independence: Independent               Hand Dominance       Extremity/Trunk Assessment                RLE Deficits / Details: poor quad activation and no independent straight leg raise. LLE Deficits / Details: Functional strenght   Cervical / Trunk Assessment: Normal  Communication   Communication: No difficulties  Cognition Arousal/Alertness: Awake/alert Behavior During Therapy: WFL for tasks assessed/performed Overall Cognitive Status: Within Functional Limits for tasks assessed                      General Comments      Exercises        Assessment/Plan    PT Assessment Patient needs continued PT services  PT Diagnosis Difficulty walking;Generalized weakness   PT Problem List Decreased strength;Decreased range of motion;Decreased activity tolerance;Decreased balance;Decreased mobility;Decreased knowledge of use of  DME  PT Treatment Interventions Gait training;Stair training;Therapeutic activities;Functional mobility training;Therapeutic exercise;Patient/family education   PT Goals (Current goals can be found in the Care Plan section) Acute Rehab PT Goals Patient Stated Goal: Be able to get back home PT Goal Formulation: With patient Time For Goal Achievement: 01/21/15 Potential to Achieve Goals: Good    Frequency 7X/week   Barriers to discharge        Co-evaluation               End of Session Equipment Utilized During  Treatment: Gait belt (walker) Activity Tolerance: Patient tolerated treatment well Patient left: in chair;with call bell/phone within reach Nurse Communication: Other (comment) (IV leaking )         Time: 6283-1517 PT Time Calculation (min) (ACUTE ONLY): 28 min   Charges:   PT Evaluation $Initial PT Evaluation Tier I: 1 Procedure PT Treatments $Therapeutic Activity: 8-22 mins   PT G Codes:        Christiane Ha, PT, CSCS Pager [phone] Office [phone]  01/07/2015, 11:46 AM

## 2015-01-07 NOTE — Progress Notes (Signed)
Subjective: 1 Day Post-Op Procedure(s) (LRB): TOTAL KNEE ARTHROPLASTY (Right)   Patient resting comfortably in bed. He is having a little nausea and not a great appetite this morning but is trygin to eat a little.   Activity level:  wbat Diet tolerance:  ok Voiding:  Foley out this morning Patient reports pain as mild and moderate.    Objective: Vital signs in last 24 hours: Temp:  [97.8 F (36.6 C)-100 F (37.8 C)] 100 F (37.8 C) (06/29 0536) Pulse Rate:  [40-89] 89 (06/29 0536) Resp:  [12-20] 16 (06/29 0536) BP: (114-151)/(58-91) 133/63 mmHg (06/29 0536) SpO2:  [95 %-100 %] 99 % (06/29 0536) Weight:  [127.461 kg (281 lb)] 127.461 kg (281 lb) (06/28 0818)  Labs:  Recent Labs  01/07/15 0444  HGB 11.3*    Recent Labs  01/07/15 0444  WBC 5.5  RBC 4.11*  HCT 35.2*  PLT 159    Recent Labs  01/07/15 0444  NA 138  K 4.4  CL 101  CO2 28  BUN 13  CREATININE 0.93  GLUCOSE 110*  CALCIUM 8.3*   No results for input(s): LABPT, INR in the last 72 hours.  Physical Exam:  Neurologically intact ABD soft Neurovascular intact Sensation intact distally Intact pulses distally Dorsiflexion/Plantar flexion intact Incision: dressing C/D/I and no drainage No cellulitis present Compartment soft  Assessment/Plan:  1 Day Post-Op Procedure(s) (LRB): TOTAL KNEE ARTHROPLASTY (Right) Advance diet Up with therapy D/C IV fluids Plan for discharge tomorrow Discharge home with home health if doing well and cleared by PT. Continue on ASA 325mg  BID x 2 weeks post op for DVT prevention. Will change dressing to aquacel tomorrow. Follow up in office 2 weeks post op.  Andrew Winters, 01/07/2015, 7:51 AM

## 2015-01-07 NOTE — Progress Notes (Signed)
Occupational Therapy Evaluation Patient Details Name: Andrew Winters MRN: [id number] DOB: 1940/08/24 Today's Date: 01/07/2015    History of Present Illness R TKA   Clinical Impression   Patient presenting with deconditioning and decreased strength which impact his ability to carryout BADLs and IADLs. Patient independent PTA. Patient currently requires up to total assist for LB ADLs, min guard for UB ADLs, and mod assist +2 for sit<>stand transfers. Patient will benefit from acute OT to increase overall independence in the areas of ADLs, functional mobility, and overall safety in order to safely discharge home with HHOT and 24/7 supervision/assistance. Depending on progress, pt may need ST SNF prior to going home for safety and additional rehab.     Follow Up Recommendations  Home health OT;Supervision/Assistance - 24 hour (Possibly SNF, depending on progress )    Equipment Recommendations  3 in 1 bedside comode    Recommendations for Other Services  None at this time   Precautions / Restrictions Precautions Precautions: Fall;Knee Precaution Comments: Reviewed no pillow under knee Required Braces or Orthoses: Knee Immobilizer - Right Knee Immobilizer - Right:  (not specified) Restrictions Weight Bearing Restrictions: Yes RLE Weight Bearing: Weight bearing as tolerated      Mobility Bed Mobility Overal bed mobility: Needs Assistance Bed Mobility: Supine to Sit;Sit to Supine     Supine to sit: Mod assist Sit to supine: Mod assist   General bed mobility comments: requiring assits with Rt leg in/out of bed as well as additional support of trunk.   Transfers Overall transfer level: Needs assistance Equipment used: Rolling walker (2 wheeled) Transfers: Sit to/from Stand Sit to Stand: Mod assist;+2 physical assistance  General transfer comment: +2 needed for sit<>stand from recliner. Patient attempted 6 times with one person, but unable. Pt required max cueing to lean forward  with nose of toes technique.     Balance Overall balance assessment: Needs assistance Sitting-balance support: No upper extremity supported;Feet supported Sitting balance-Leahy Scale: Fair     Standing balance support: Bilateral upper extremity supported;During functional activity Standing balance-Leahy Scale: Fair    ADL Overall ADL's : Needs assistance/impaired General ADL Comments: Pt requires up to total assist for LB ADLs, min guard for UB ADLs, and mod assist +2 for sit<>stand transfers. Pt with difficulty following one-step commands (taking extra time). Pt limited by deconditioning and decreased overall strength. At this time recommending HHOT, depending on progress.     Pertinent Vitals/Pain Pain Assessment: 0-10 Pain Score: 9  Pain Location: rt knee Pain Descriptors / Indicators: Aching Pain Intervention(s): Limited activity within patient's tolerance;Monitored during session     Hand Dominance Left   Extremity/Trunk Assessment Upper Extremity Assessment Upper Extremity Assessment: Overall WFL for tasks assessed   Lower Extremity Assessment RLE Deficits / Details: poor quad activation and no independent straight leg raise. LLE Deficits / Details: Functional strenght    Cervical / Trunk Assessment Cervical / Trunk Assessment: Normal   Communication Communication Communication: No difficulties   Cognition Arousal/Alertness: Awake/alert Behavior During Therapy: WFL for tasks assessed/performed Overall Cognitive Status: No family/caregiver present to determine baseline cognitive functioning (Pt with difficulty time following one step commands (taking extra time and requiring up to mod->max cues for sequencing))       Memory: Decreased recall of precautions              Home Living Family/patient expects to be discharged to:: Private residence Living Arrangements: Spouse/significant other Available Help at Discharge: Family Type of Home: House Home Access:  Level entry Entrance Stairs-Number of Steps:  (patient denies any steps into home)   Home Layout: One level     Bathroom Shower/Tub: Walk-in Pensions consultant: Standard     Home Equipment: None   Prior Functioning/Environment Level of Independence: Independent     OT Diagnosis: Generalized weakness;Acute pain   OT Problem List: Decreased strength;Decreased range of motion;Decreased activity tolerance;Impaired balance (sitting and/or standing);Decreased safety awareness;Decreased knowledge of use of DME or AE;Decreased knowledge of precautions;Pain   OT Treatment/Interventions: Self-care/ADL training;Therapeutic exercise;Energy conservation;DME and/or AE instruction;Therapeutic activities;Patient/family education;Balance training    OT Goals(Current goals can be found in the care plan section) Acute Rehab OT Goals Patient Stated Goal: I want to go home OT Goal Formulation: With patient Time For Goal Achievement: 01/14/15 Potential to Achieve Goals: Good ADL Goals Pt Will Perform Grooming: with supervision;standing Pt Will Perform Upper Body Bathing: Independently;sitting Pt Will Perform Lower Body Bathing: sit to/from stand;with min assist Pt Will Perform Upper Body Dressing: Independently;sitting Pt Will Perform Lower Body Dressing: with min assist;sit to/from stand Pt Will Transfer to Toilet: with min assist;bedside commode;ambulating Pt Will Perform Toileting - Clothing Manipulation and hygiene: with min assist;sit to/from stand Pt Will Perform Tub/Shower Transfer: Shower transfer;with min assist;rolling walker;ambulating;3 in 1 Additional ADL Goal #1: Pt will independently verbalize and adhere to knee precautions, including no pillow under knee  OT Frequency: Min 2X/week   Barriers to D/C: None known at this time, pt reports his wife can assist post acute d/c   End of Session Equipment Utilized During Treatment: Gait belt;Rolling walker;Right knee  immobilizer CPM Right Knee CPM Right Knee: Off Nurse Communication: Mobility status  Activity Tolerance: Patient tolerated treatment well Patient left: in chair;with call bell/phone within reach   Time: 1118-1140 OT Time Calculation (min): 22 min Charges:  OT General Charges $OT Visit: 1 Procedure OT Evaluation $Initial OT Evaluation Tier I: 1 Procedure  Jameeka Marcy , MS, OTR/L, CLT Pager: [phone]  01/07/2015, 12:03 PM

## 2015-01-07 NOTE — Progress Notes (Signed)
Physical Therapy Treatment Patient Details Name: Andrew Winters MRN: [id number] DOB: 30-Aug-1940 Today's Date: 01/07/2015    History of Present Illness R TKA    PT Comments    Patient had significant difficulty with transfer from chair to bed. Patient required 5 attempts to stand with 1 successful transfer and max assist. Patient required frequent and repeated cues for mobility sequence. When attempting to sit on edge of bed, patient was unable to respond despite repeated verbal and tactile cues. After session patient responding that he does not know why he could not sit. Discussed possibility of needing further skilled care before return home. Patient reports that he doesn't feel like he could go home anytime soon. He described that he was planning to stay in the hospital for about a week.   Follow Up Recommendations  SNF     Equipment Recommendations  Rolling walker with 5" wheels (extra wide bariatric walker for 300 pound plus.)    Recommendations for Other Services       Precautions / Restrictions Precautions Precautions: Fall;Knee Precaution Comments: Reviewed no pillow under knee Required Braces or Orthoses: Knee Immobilizer - Right Knee Immobilizer - Right:  (not specified) Restrictions Weight Bearing Restrictions: Yes RLE Weight Bearing: Weight bearing as tolerated    Mobility  Bed Mobility Overal bed mobility: Needs Assistance Bed Mobility: Sit to Supine     Supine to sit: Mod assist Sit to supine: Mod assist   General bed mobility comments: Heavy cues and assist with Rt. let required.   Transfers Overall transfer level: Needs assistance Equipment used: Rolling walker (2 wheeled) (wide bariatric) Transfers: Sit to/from Stand Sit to Stand: +2 physical assistance (Multiple attempts/cues/physical assist from chair)         General transfer comment: +2 needed for sit<>stand from recliner. Patient attempted 6 times with one person, but unable. Pt required max  cueing to lean forward with nose of toes technique.   Ambulation/Gait                 Stairs            Wheelchair Mobility    Modified Rankin (Stroke Patients Only)       Balance Overall balance assessment: Needs assistance Sitting-balance support: No upper extremity supported;Feet supported Sitting balance-Leahy Scale: Fair     Standing balance support: Bilateral upper extremity supported;During functional activity Standing balance-Leahy Scale: Fair                      Cognition Arousal/Alertness: Awake/alert Behavior During Therapy:  (inconsistent ability to follow commands.) Overall Cognitive Status: No family/caregiver present to determine baseline cognitive functioning (Pt with difficulty time following one step commands (taking extra time and requiring up to mod->max cues for sequencing))       Memory: Decreased recall of precautions              Exercises      General Comments        Pertinent Vitals/Pain Pain Assessment: 0-10 Pain Score: 7  Pain Location: rt knee Pain Descriptors / Indicators: Aching Pain Intervention(s): Limited activity within patient's tolerance;Monitored during session    Home Living Family/patient expects to be discharged to:: Private residence Living Arrangements: Spouse/significant other Available Help at Discharge: Family Type of Home: House Home Access: Level entry   Home Layout: One level Home Equipment: None      Prior Function Level of Independence: Independent          PT  Goals (current goals can now be found in the care plan section) Acute Rehab PT Goals Patient Stated Goal: I want to go home PT Goal Formulation: With patient Time For Goal Achievement: 01/21/15 Potential to Achieve Goals: Good    Frequency  7X/week    PT Plan Discharge plan needs to be updated    Co-evaluation             End of Session Equipment Utilized During Treatment: Gait belt;Right knee  immobilizer Activity Tolerance: Patient limited by fatigue Patient left: in bed;with call bell/phone within reach     Time: 1305-1329 PT Time Calculation (min) (ACUTE ONLY): 24 min  Charges:  $Therapeutic Activity: 23-37 mins                    G Codes:      Christiane Ha, PT, CSCS Pager [phone] Office [phone]  01/07/2015, 2:41 PM

## 2015-01-07 NOTE — Anesthesia Postprocedure Evaluation (Signed)
  Anesthesia Post-op Note  Patient: Andrew Winters  Procedure(s) Performed: Procedure(s): TOTAL KNEE ARTHROPLASTY (Right)  Patient Location: PACU  Anesthesia Type:General and GA combined with regional for post-op pain  Level of Consciousness: awake and alert   Airway and Oxygen Therapy: Patient Spontanous Breathing  Post-op Pain: mild  Post-op Assessment: Post-op Vital signs reviewed and Patient's Cardiovascular Status Stable     RLE Motor Response: Responds to commands RLE Sensation: Full sensation, No numbness, No tingling      Post-op Vital Signs: stable  Last Vitals:  Filed Vitals:   01/07/15 1030  BP: 133/63  Pulse:   Temp:   Resp:     Complications: No apparent anesthesia complications

## 2015-01-08 ENCOUNTER — Inpatient Hospital Stay (HOSPITAL_COMMUNITY): Payer: Medicare Other

## 2015-01-08 ENCOUNTER — Encounter (HOSPITAL_COMMUNITY): Payer: Self-pay

## 2015-01-08 DIAGNOSIS — M1711 Unilateral primary osteoarthritis, right knee: Principal | ICD-10-CM

## 2015-01-08 DIAGNOSIS — R509 Fever, unspecified: Secondary | ICD-10-CM | POA: Diagnosis not present

## 2015-01-08 DIAGNOSIS — I10 Essential (primary) hypertension: Secondary | ICD-10-CM

## 2015-01-08 DIAGNOSIS — G9340 Encephalopathy, unspecified: Secondary | ICD-10-CM

## 2015-01-08 DIAGNOSIS — A419 Sepsis, unspecified organism: Secondary | ICD-10-CM

## 2015-01-08 LAB — BASIC METABOLIC PANEL
Anion gap: 6 (ref 5–15)
BUN: 10 mg/dL (ref 6–20)
CHLORIDE: 104 mmol/L (ref 101–111)
CO2: 27 mmol/L (ref 22–32)
CREATININE: 0.93 mg/dL (ref 0.61–1.24)
Calcium: 8.1 mg/dL — ABNORMAL LOW (ref 8.9–10.3)
GFR calc Af Amer: >60 mL/min (ref >60)
GFR calc non Af Amer: >60 mL/min (ref >60)
Glucose, Bld: 99 mg/dL (ref 65–99)
POTASSIUM: 4.6 mmol/L (ref 3.5–5.1)
Sodium: 137 mmol/L (ref 135–145)

## 2015-01-08 LAB — CBC WITH DIFFERENTIAL/PLATELET
Basophils Absolute: 0 10*3/uL (ref 0.0–0.1)
Basophils Relative: 0 % (ref 0–1)
EOS ABS: 0 10*3/uL (ref 0.0–0.7)
Eosinophils Relative: 0 % (ref 0–5)
HEMATOCRIT: 32 % — AB (ref 39.0–52.0)
Hemoglobin: 10.3 g/dL — ABNORMAL LOW (ref 13.0–17.0)
LYMPHS ABS: 1 10*3/uL (ref 0.7–4.0)
Lymphocytes Relative: 14 % (ref 12–46)
MCH: 27.2 pg (ref 26.0–34.0)
MCHC: 32.2 g/dL (ref 30.0–36.0)
MCV: 84.7 fL (ref 78.0–100.0)
MONO ABS: 1 10*3/uL (ref 0.1–1.0)
Monocytes Relative: 14 % — ABNORMAL HIGH (ref 3–12)
NEUTROS ABS: 5.1 10*3/uL (ref 1.7–7.7)
NEUTROS PCT: 72 % (ref 43–77)
PLATELETS: 144 10*3/uL — AB (ref 150–400)
RBC: 3.78 MIL/uL — AB (ref 4.22–5.81)
RDW: 17.1 % — ABNORMAL HIGH (ref 11.5–15.5)
WBC: 7.1 10*3/uL (ref 4.0–10.5)

## 2015-01-08 LAB — CBC
HCT: 31.8 % — ABNORMAL LOW (ref 39.0–52.0)
Hemoglobin: 10.1 g/dL — ABNORMAL LOW (ref 13.0–17.0)
MCH: 26.7 pg (ref 26.0–34.0)
MCHC: 31.8 g/dL (ref 30.0–36.0)
MCV: 84.1 fL (ref 78.0–100.0)
PLATELETS: 147 10*3/uL — AB (ref 150–400)
RBC: 3.78 MIL/uL — ABNORMAL LOW (ref 4.22–5.81)
RDW: 17 % — AB (ref 11.5–15.5)
WBC: 7.3 10*3/uL (ref 4.0–10.5)

## 2015-01-08 LAB — GLUCOSE, CAPILLARY
GLUCOSE-CAPILLARY: 62 mg/dL — AB (ref 65–99)
Glucose-Capillary: 72 mg/dL (ref 65–99)
Glucose-Capillary: 99 mg/dL (ref 65–99)
Glucose-Capillary: 49 mg/dL — ABNORMAL LOW (ref 65–99)
Glucose-Capillary: 90 mg/dL (ref 65–99)

## 2015-01-08 LAB — HEPATIC FUNCTION PANEL
ALT: 12 U/L — ABNORMAL LOW (ref 17–63)
AST: 27 U/L (ref 15–41)
Albumin: 3 g/dL — ABNORMAL LOW (ref 3.5–5.0)
Alkaline Phosphatase: 53 U/L (ref 38–126)
Bilirubin, Direct: 0.2 mg/dL (ref 0.1–0.5)
Indirect Bilirubin: 0.3 mg/dL (ref 0.3–0.9)
Total Bilirubin: 0.5 mg/dL (ref 0.3–1.2)
Total Protein: 5.9 g/dL — ABNORMAL LOW (ref 6.5–8.1)

## 2015-01-08 LAB — PROTIME-INR
INR: 1.28 (ref 0.00–1.49)
Prothrombin Time: 16.1 seconds — ABNORMAL HIGH (ref 11.6–15.2)

## 2015-01-08 LAB — URINALYSIS W MICROSCOPIC (NOT AT ARMC)
Bilirubin Urine: NEGATIVE
Glucose, UA: NEGATIVE mg/dL
Hgb urine dipstick: NEGATIVE
Ketones, ur: NEGATIVE mg/dL
Leukocytes, UA: NEGATIVE
Nitrite: NEGATIVE
Protein, ur: NEGATIVE mg/dL
Specific Gravity, Urine: 1.015 (ref 1.005–1.030)
Urobilinogen, UA: 0.2 mg/dL (ref 0.0–1.0)
pH: 8 (ref 5.0–8.0)

## 2015-01-08 LAB — PROCALCITONIN: Procalcitonin: 0.44 ng/mL

## 2015-01-08 LAB — APTT: APTT: 40 s — AB (ref 24–37)

## 2015-01-08 LAB — LACTIC ACID, PLASMA: Lactic Acid, Venous: 1 mmol/L (ref 0.5–2.0)

## 2015-01-08 MED ORDER — TRAMADOL HCL 50 MG PO TABS
50.0000 mg | ORAL_TABLET | Freq: Four times a day (QID) | ORAL | Status: DC | PRN
  Administered 2015-01-08 – 2015-01-13 (×11): 100 mg via ORAL
  Administered 2015-01-14: 50 mg via ORAL
  Filled 2015-01-08 (×4): qty 2
  Filled 2015-01-08: qty 1
  Filled 2015-01-08 (×7): qty 2

## 2015-01-08 MED ORDER — IOHEXOL 350 MG/ML SOLN
100.0000 mL | Freq: Once | INTRAVENOUS | Status: AC | PRN
  Administered 2015-01-08: 100 mL via INTRAVENOUS

## 2015-01-08 NOTE — Progress Notes (Signed)
Dr. Janee Morn updated on 102 temp, new order entered and awaiting blood cultures.

## 2015-01-08 NOTE — Progress Notes (Signed)
Hypoglycemic Event  CBG: 49  Treatment: 15 GM carbohydrate snack  Symptoms: None  Follow-up CBG: Time: 2218 CBG Result: 90  Possible Reasons for Event: Unknown  Comments/MD notified: hypoglycemic protocol followed    Berton Mount  Remember to initiate Hypoglycemia Order Set & complete

## 2015-01-08 NOTE — Progress Notes (Signed)
[facility] PROGRESS NOTE  Andrew Winters YNW:[id number] DOB: 16-Mar-1941 DOA: 01/06/2015 PCP: Crist Fat, MD  Assessment/Plan: #1 systemic inflammatory response syndrome Unknown etiology. Patient spiking fevers. Patient has been pancultured and cultures are pending. Lactic acid level within normal limits. Pro calcitonin within normal limits. Urinalysis leukocytes negative, nitrites negative. Chest x-ray negative for any acute infiltrate. Will repeat chest x-ray in the morning. Continue empiric IV vancomycin and IV Zosyn.  #2 fever Unknown etiology. Patient with a normal white count. Patient has been pancultured and cultures are pending. Urinalysis is negative for UTI. Chest x-ray is negative for any acute infiltrate. Blood cultures are pending. Will repeat chest x-ray in the morning. Will check lower extremity Dopplers to rule out DVT. If patient continues to spike fevers with no source may need to assess right knee to rule out a septic joint. Continue empiric IV vancomycin and IV Zosyn.  #3 acute encephalopathy with history of dementia Likely secondary to metabolic encephalopathy. Patient with clinical improvement and close to baseline per wife. Patient has been pancultured and cultures are pending. Continue empiric IV antibiotics. Follow.  #4. hypertension HCTZ on hold. Continue current regimen. Follow.  #5 diabetes mellitus type 2 Will discontinue oral hypoglycemic agents. Continue sliding scale insulin.  #6 right knee osteoarthritis Status post right TKA. Dr. Jerl Santos 01/06/2015  #7 prophylaxis Aspirin for DVT prophylaxis per primary team.  Code Status: Full Family Communication: Updated patient and wife at bedside. Disposition Plan: Probably to skilled nursing facility once medically stable and afebrile. Per primary team.   Consultants:  [facility]; Dr Toniann Fail 01/07/2015  Procedures:  CT head 01/08/2015  Chest x-ray 01/07/2015  R TKA perr Dr Jerl Santos  01/06/15  Antibiotics:  IV vancomycin 01/07/2015  IV Zosyn 01/07/2015  HPI/Subjective: Patient states he's feeling a little bit better than yesterday. Patient complaining of pain in the right knee. Patient denies any shortness of breath. No chest pain.  Objective: Filed Vitals:   01/08/15 1608  BP: 133/78  Pulse: 109  Temp: 102.7 F (39.3 C)  Resp:     Intake/Output Summary (Last 24 hours) at 01/08/15 1828 Last data filed at 01/08/15 1600  Gross per 24 hour  Intake    825 ml  Output   1900 ml  Net  -1075 ml   Filed Weights   01/06/15 0818  Weight: 127.461 kg (281 lb)    Exam:   General:  NAD  Cardiovascular: RRR  Respiratory: CTAB anterior lung fields  Abdomen: Soft, nontender, nondistended, positive bowel sounds  Musculoskeletal: No clubbing cyanosis. Right lower extremity in a leg brace.  Data Reviewed: Basic Metabolic Panel:  Recent Labs Lab 01/07/15 0444 01/08/15 0008  NA 138 137  K 4.4 4.6  CL 101 104  CO2 28 27  GLUCOSE 110* 99  BUN 13 10  CREATININE 0.93 0.93  CALCIUM 8.3* 8.1*   Liver Function Tests:  Recent Labs Lab 01/08/15 0008  AST 27  ALT 12*  ALKPHOS 53  BILITOT 0.5  PROT 5.9*  ALBUMIN 3.0*   No results for input(s): LIPASE, AMYLASE in the last 168 hours. No results for input(s): AMMONIA in the last 168 hours. CBC:  Recent Labs Lab 01/07/15 0444 01/08/15 0008 01/08/15 0412  WBC 5.5 7.1 7.3  NEUTROABS  --  5.1  --   HGB 11.3* 10.3* 10.1*  HCT 35.2* 32.0* 31.8*  MCV 85.6 84.7 84.1  PLT 159 144* 147*   Cardiac Enzymes: No results for input(s): CKTOTAL, CKMB, CKMBINDEX, TROPONINI  in the last 168 hours. BNP (last 3 results) No results for input(s): BNP in the last 8760 hours.  ProBNP (last 3 results) No results for input(s): PROBNP in the last 8760 hours.  CBG:  Recent Labs Lab 01/07/15 1657 01/07/15 2148 01/08/15 0613 01/08/15 1132 01/08/15 1710  GLUCAP 80 90 72 99 62*    No results found for this  or any previous visit (from the past 240 hour(s)).   Studies: Dg Chest 1 View  01/07/2015   CLINICAL DATA:  Disoriented.  Wheezing.  EXAM: CHEST  1 VIEW  COMPARISON:  12/25/2014  FINDINGS: There is mild unchanged cardiomegaly. The lungs are grossly clear. There is no large effusion. There is no pneumothorax.  IMPRESSION: No acute cardiopulmonary findings.   Electronically Signed   By: Ellery Plunk M.D.   On: 01/07/2015 21:32   Ct Head Wo Contrast  01/08/2015   CLINICAL DATA:  Acute encephalopathy.  EXAM: CT HEAD WITHOUT CONTRAST  TECHNIQUE: Contiguous axial images were obtained from the base of the skull through the vertex without intravenous contrast.  COMPARISON:  07/03/2011.  FINDINGS: No evidence of an acute infarct, acute hemorrhage, mass lesion, mass effect or hydrocephalus. Atrophy. Minimal periventricular low attenuation. No air-fluid levels in the paranasal sinuses or mastoid air cells.  IMPRESSION: 1. No acute intracranial abnormality. 2. Atrophy and mild chronic microvascular white matter ischemic changes.   Electronically Signed   By: Leanna Battles M.D.   On: 01/08/2015 08:29    Scheduled Meds: . amLODipine  5 mg Oral Daily  . aspirin EC  325 mg Oral BID PC  . docusate sodium  100 mg Oral BID  . donepezil  5 mg Oral QHS  . fluticasone  1 spray Each Nare Daily  . gabapentin  100 mg Oral TID  . glimepiride  4 mg Oral QAC breakfast  . insulin aspart  0-20 Units Subcutaneous TID WC  . linagliptin  5 mg Oral Daily  . lisinopril  2.5 mg Oral BID  . pantoprazole  40 mg Oral Daily  . pioglitazone  15 mg Oral Daily  . piperacillin-tazobactam (ZOSYN)  IV  3.375 g Intravenous 3 times per day  . sertraline  25 mg Oral Daily  . traZODone  50 mg Oral QHS  . vancomycin  1,000 mg Intravenous Q12H   Continuous Infusions: . sodium chloride 75 mL/hr at 01/08/15 1655    Principal Problem:   Primary osteoarthritis of right knee Active Problems:   SIRS (systemic inflammatory response  syndrome)   Fever   GERD   Diabetes   Morbid obesity   Diabetes mellitus type 2, controlled   Essential hypertension   Acute encephalopathy    Time spent: 40 minutes    [facility] Pager [phone]. If 7PM-7AM, please contact night-coverage at [email or web address], password [facility] 01/08/2015, 6:28 PM  LOS: 2 days

## 2015-01-08 NOTE — Progress Notes (Signed)
PA on call was notified at 6/29 at 2045 that pt had elevated temp and disoriented and tachycardic skin clammy and wheezing in upper lobes. Rapid response  was also called to assist in assessing the patient. New order were received. Ilean Skill LPN

## 2015-01-08 NOTE — Progress Notes (Signed)
Physical Therapy Treatment Patient Details Name: Andrew Winters MRN: [id number] DOB: November 03, 1940 Today's Date: 01/08/2015    History of Present Illness R TKA    PT Comments    Patient with improved mobility and ability to follow commands today. Still slow with mobilization but able to ambulate 5 feet today. Verbal cues required for gait sequence and physical and verbal assistance still required throughout session. Recommending SNF for further rehabilitation upon D/C. Discussed with patient.   Follow Up Recommendations  SNF     Equipment Recommendations  Rolling walker with 5" wheels (XL bariatric)    Recommendations for Other Services       Precautions / Restrictions Precautions Precautions: Fall;Knee Required Braces or Orthoses: Knee Immobilizer - Right Knee Immobilizer - Right: Other (comment) (not specified frequency) Restrictions Weight Bearing Restrictions: Yes RLE Weight Bearing: Weight bearing as tolerated    Mobility  Bed Mobility Overal bed mobility: Needs Assistance Bed Mobility: Supine to Sit     Supine to sit: Max assist;HOB elevated        Transfers Overall transfer level: Needs assistance Equipment used: Rolling walker (2 wheeled) Transfers: Sit to/from Stand Sit to Stand: +2 physical assistance;From elevated surface            Ambulation/Gait Ambulation/Gait assistance: Mod assist Ambulation Distance (Feet): 5 Feet Assistive device: Rolling walker (2 wheeled) (XL bariatric, knee immobilizer) Gait Pattern/deviations: Decreased step length - right;Decreased stance time - right;Decreased weight shift to right         Stairs            Wheelchair Mobility    Modified Rankin (Stroke Patients Only)       Balance     Sitting balance-Leahy Scale: Fair       Standing balance-Leahy Scale: Fair                      Cognition Arousal/Alertness: Awake/alert Behavior During Therapy: Flat affect Overall Cognitive Status:  Within Functional Limits for tasks assessed                      Exercises Total Joint Exercises Ankle Circles/Pumps: AROM;Both;10 reps Quad Sets: Right;10 reps;Strengthening Heel Slides: AAROM;Right;10 reps Straight Leg Raises: 10 reps;Strengthening (max assist plus verbal encouragement) Knee Flexion: AAROM;Right;10 reps Goniometric ROM: 6-36    General Comments        Pertinent Vitals/Pain Pain Assessment: No/denies pain Pain Score: 0-No pain    Home Living                      Prior Function            PT Goals (current goals can now be found in the care plan section) Progress towards PT goals: Progressing toward goals    Frequency  7X/week    PT Plan Current plan remains appropriate    Co-evaluation             End of Session Equipment Utilized During Treatment: Gait belt Activity Tolerance: Patient tolerated treatment well Patient left: in chair;with call bell/phone within reach     Time: 1016-1045 PT Time Calculation (min) (ACUTE ONLY): 29 min  Charges:  $Gait Training: 8-22 mins $Therapeutic Exercise: 8-22 mins                    G Codes:      Christiane Ha, PT, CSCS Pager [phone] Office [phone]  01/08/2015,  11:03 AM

## 2015-01-08 NOTE — Care Management (Signed)
Important Message  Patient Details  Name: Andrew Winters MRN: [id number] Date of Birth: [birth year]   Medicare Important Message Given:  Yes-second notification given    Orson Aloe 01/08/2015, 10:31 AM

## 2015-01-08 NOTE — Progress Notes (Signed)
Patient temp 102, given tylenol, encourage turn, cough, deep breath, and use incentive spirometer.

## 2015-01-08 NOTE — Progress Notes (Signed)
Subjective: 2 Days Post-Op Procedure(s) (LRB): TOTAL KNEE ARTHROPLASTY (Right)   Patient was seen last night by rapid response team and also medicine. He had an elevated fever and heart rate. They have place him on empiric antibiotics and have ordered labs looking for sepsis. He was also very confused last night. His wife was not present and he does have a history of dementia. He is not complaining of any significant pain in his knee. He is still incontinent of urine which is also his baseline.   Activity level:  wbat Diet tolerance:  ok Voiding:  Incontinent of urine. Failing a condom cath as it keeps falling off. Patient reports pain as mild.    Objective: Vital signs in last 24 hours: Temp:  [98.6 F (37 C)-103.4 F (39.7 C)] 102.6 F (39.2 C) (06/30 0401) Pulse Rate:  [87-116] 90 (06/30 0401) Resp:  [17-20] 20 (06/30 0101) BP: (115-141)/(60-69) 130/60 mmHg (06/30 0401) SpO2:  [97 %-100 %] 100 % (06/30 0401)  Labs:  Recent Labs  01/07/15 0444 01/08/15 0008 01/08/15 0412  HGB 11.3* 10.3* 10.1*    Recent Labs  01/08/15 0008 01/08/15 0412  WBC 7.1 7.3  RBC 3.78* 3.78*  HCT 32.0* 31.8*  PLT 144* 147*    Recent Labs  01/07/15 0444 01/08/15 0008  NA 138 137  K 4.4 4.6  CL 101 104  CO2 28 27  BUN 13 10  CREATININE 0.93 0.93  GLUCOSE 110* 99  CALCIUM 8.3* 8.1*    Recent Labs  01/08/15 0008  INR 1.28    Physical Exam:  Neurologically intact ABD soft Neurovascular intact Sensation intact distally Intact pulses distally Dorsiflexion/Plantar flexion intact Incision: dressing C/D/I and saturated in urine but no signs of drainage from knee. No cellulitis present Compartment soft   He is alert and oriented to person time and place this morning. His dressing was changed with no pain in knee while moving it around for dressing change.  Assessment/Plan:  2 Days Post-Op Procedure(s) (LRB): TOTAL KNEE ARTHROPLASTY (Right) Advance diet Up with therapy   Since condom cath is not working we will place a foley so he does not continue to be incontinent. We greatly appreciate medical management. Dressing changed to aquacel this morning.  Continue ASA 325mg  BID x 2 weeks for DVT prevention.  We will check back with him mid day when his wife is here to discuss his baseline dementia and to see if that was the cause for his confusion last night.  We will avoid narcotics to help limit confusion and will switch him to tramadol. His discharge planning will be discussed later with his wife but at this time it appears that SNF may be his best option.     Eulia Hatcher, 01/08/2015, 7:31 AM

## 2015-01-08 NOTE — Progress Notes (Signed)
Physical Therapy Treatment Patient Details Name: Colen Eltzroth MRN: [id number] DOB: [birth year] Today's Date: 01/08/2015    History of Present Illness R TKA    PT Comments    Patient requiring assist of +2 with bed mobility and transfers. In afternoon session, ambulation was attempted but patient was unable to perform at this time. Patient also having increased difficulty following commands in afternoon. Discussed SNF recommendations with patient and wife after session. They are both agreeing with this.   Follow Up Recommendations  SNF     Equipment Recommendations  Rolling walker with 5" wheels (XL bariatric)    Recommendations for Other Services       Precautions / Restrictions Precautions Precautions: Fall;Knee Required Braces or Orthoses: Knee Immobilizer - Right Knee Immobilizer - Right: Other (comment) (not specified frequency) Restrictions Weight Bearing Restrictions: Yes RLE Weight Bearing: Weight bearing as tolerated    Mobility  Bed Mobility Overal bed mobility: Needs Assistance Bed Mobility: Sit to Supine     Supine to sit: Max assist;HOB elevated Sit to supine: +2 for physical assistance      Transfers Overall transfer level: Needs assistance Equipment used: Rolling walker (2 wheeled) Transfers: Sit to/from Stand Sit to Stand: +2 physical assistance         General transfer comment: from chair to bed, stand quarter turn and bed brought from behind for safety.   Ambulation/Gait Ambulation/Gait assistance:  (unable to safely ambulate in pm) Ambulation Distance (Feet): 5 Feet Assistive device: Rolling walker (2 wheeled) (XL bariatric, knee immobilizer) Gait Pattern/deviations: Decreased step length - right;Decreased stance time - right;Decreased weight shift to right         Stairs            Wheelchair Mobility    Modified Rankin (Stroke Patients Only)       Balance     Sitting balance-Leahy Scale: Fair       Standing  balance-Leahy Scale: Fair                      Cognition Arousal/Alertness: Awake/alert Behavior During Therapy: Flat affect Overall Cognitive Status: Impaired/Different from baseline Area of Impairment: Following commands       Following Commands: Follows one step commands inconsistently       General Comments: Patient having increased difficulty following commands during pm session. Repeated cues and manual assistance needed throughout session.     Exercises Total Joint Exercises Ankle Circles/Pumps: AROM;Both;10 reps Quad Sets: Right;10 reps;Strengthening Heel Slides: AAROM;Right;10 reps Straight Leg Raises: 10 reps;Strengthening (max assist plus verbal encouragement) Knee Flexion: AAROM;Right;10 reps Goniometric ROM: 6-36    General Comments        Pertinent Vitals/Pain Pain Assessment: 0-10 Pain Score: 6  Pain Location: Rt knee Pain Descriptors / Indicators: Sore Pain Intervention(s): Limited activity within patient's tolerance    Home Living                      Prior Function            PT Goals (current goals can now be found in the care plan section) Progress towards PT goals: Not progressing toward goals - comment (decreased mobility during afternoon session. )    Frequency  7X/week    PT Plan Current plan remains appropriate    Co-evaluation             End of Session Equipment Utilized During Treatment: Gait belt;Right knee immobilizer Activity Tolerance: Patient limited  by fatigue Patient left: in bed;with call bell/phone within reach;with family/visitor present     Time: 5697-9480 PT Time Calculation (min) (ACUTE ONLY): 21 min  Charges:  $Gait Training: 8-22 mins $Therapeutic Exercise: 8-22 mins $Therapeutic Activity: 8-22 mins                    G Codes:      Christiane Ha, PT, CSCS Pager [phone] Office [phone]  01/08/2015, 2:40 PM

## 2015-01-08 NOTE — Progress Notes (Signed)
Called per floor RN regarding Pt with elevated temp, confusion and tachycardia. Rectal temp obtained yielding 103.4.  Pt found resting in bed, pleasantly confused, Per RN Patient has history of dementia and has been confused since late this afternoon. BP 115/66, hr 90-100. RN advised to notfy Primary Provider of fever and suggest blood cultures and ABX. PA on call paged and updated on Pt status, Medical MD consulted.   2245 update-Dr. Toniann Fail from [facility] at bedside, Pt to receive full sepsis workup, ABX ordered. RN to reassess temp and carry out MD orders.

## 2015-01-09 ENCOUNTER — Inpatient Hospital Stay (HOSPITAL_COMMUNITY): Payer: Medicare Other

## 2015-01-09 ENCOUNTER — Ambulatory Visit (HOSPITAL_COMMUNITY): Payer: Medicare Other

## 2015-01-09 DIAGNOSIS — R5082 Postprocedural fever: Secondary | ICD-10-CM | POA: Insufficient documentation

## 2015-01-09 DIAGNOSIS — R509 Fever, unspecified: Secondary | ICD-10-CM

## 2015-01-09 LAB — CBC
HCT: 28.8 % — ABNORMAL LOW (ref 39.0–52.0)
HEMOGLOBIN: 9.3 g/dL — AB (ref 13.0–17.0)
MCH: 27.3 pg (ref 26.0–34.0)
MCHC: 32.3 g/dL (ref 30.0–36.0)
MCV: 84.5 fL (ref 78.0–100.0)
PLATELETS: 128 10*3/uL — AB (ref 150–400)
RBC: 3.41 MIL/uL — ABNORMAL LOW (ref 4.22–5.81)
RDW: 17.2 % — AB (ref 11.5–15.5)
WBC: 6.9 10*3/uL (ref 4.0–10.5)

## 2015-01-09 LAB — BASIC METABOLIC PANEL
Anion gap: 7 (ref 5–15)
BUN: 13 mg/dL (ref 6–20)
CALCIUM: 7.8 mg/dL — AB (ref 8.9–10.3)
CO2: 26 mmol/L (ref 22–32)
CREATININE: 0.96 mg/dL (ref 0.61–1.24)
Chloride: 103 mmol/L (ref 101–111)
GFR calc Af Amer: >60 mL/min (ref >60)
Glucose, Bld: 69 mg/dL (ref 65–99)
Potassium: 3.9 mmol/L (ref 3.5–5.1)
SODIUM: 136 mmol/L (ref 135–145)

## 2015-01-09 LAB — IRON AND TIBC
Iron: 10 ug/dL — ABNORMAL LOW (ref 45–182)
SATURATION RATIOS: 4 % — AB (ref 17.9–39.5)
TIBC: 248 ug/dL — ABNORMAL LOW (ref 250–450)
UIBC: 238 ug/dL

## 2015-01-09 LAB — GLUCOSE, CAPILLARY
GLUCOSE-CAPILLARY: 131 mg/dL — AB (ref 65–99)
Glucose-Capillary: 111 mg/dL — ABNORMAL HIGH (ref 65–99)
Glucose-Capillary: 113 mg/dL — ABNORMAL HIGH (ref 65–99)
Glucose-Capillary: 128 mg/dL — ABNORMAL HIGH (ref 65–99)
Glucose-Capillary: 139 mg/dL — ABNORMAL HIGH (ref 65–99)
Glucose-Capillary: 146 mg/dL — ABNORMAL HIGH (ref 65–99)
Glucose-Capillary: 66 mg/dL (ref 65–99)

## 2015-01-09 LAB — URINE CULTURE: Culture: NO GROWTH

## 2015-01-09 LAB — FERRITIN: FERRITIN: 94 ng/mL (ref 24–336)

## 2015-01-09 LAB — PROCALCITONIN: Procalcitonin: 0.43 ng/mL

## 2015-01-09 LAB — VITAMIN B12: Vitamin B-12: 282 pg/mL (ref 180–914)

## 2015-01-09 LAB — FOLATE: FOLATE: 15.5 ng/mL (ref >5.9)

## 2015-01-09 MED ORDER — INSULIN ASPART 100 UNIT/ML ~~LOC~~ SOLN
0.0000 [IU] | Freq: Three times a day (TID) | SUBCUTANEOUS | Status: DC
  Administered 2015-01-10: 1 [IU] via SUBCUTANEOUS
  Administered 2015-01-11 – 2015-01-12 (×3): 2 [IU] via SUBCUTANEOUS
  Administered 2015-01-13 – 2015-01-14 (×4): 1 [IU] via SUBCUTANEOUS

## 2015-01-09 NOTE — Progress Notes (Signed)
Subjective: 3 Days Post-Op Procedure(s) (LRB): TOTAL KNEE ARTHROPLASTY (Right)   Patient sitting up in bed alert this morning. He did not have any significant fever this morning. His wife states that he " coughed up a lot of stuff" last night. He is not complaining of any significant new or worsening pain in his knee. He also states that he had a bowel movement this morning and that he is feeling better from that standpoint too. He is currently waiting to get someone to help clean him up. He did have an episode of hypoglycemia over night but this was treated with a carb snack.  Activity level:  wbat Diet tolerance:  ok Voiding:  BM this am and foley in place. Patient reports pain as mild.    Objective: Vital signs in last 24 hours: Temp:  [99 F (37.2 C)-102.7 F (39.3 C)] 99 F (37.2 C) (07/01 0600) Pulse Rate:  [83-109] 83 (07/01 0600) Resp:  [16] 16 (07/01 0600) BP: (113-133)/(61-78) 113/67 mmHg (07/01 0600) SpO2:  [93 %-99 %] 93 % (07/01 0600)  Labs:  Recent Labs  01/07/15 0444 01/08/15 0008 01/08/15 0412 01/09/15 0519  HGB 11.3* 10.3* 10.1* 9.3*    Recent Labs  01/08/15 0412 01/09/15 0519  WBC 7.3 6.9  RBC 3.78* 3.41*  HCT 31.8* 28.8*  PLT 147* 128*    Recent Labs  01/08/15 0008 01/09/15 0519  NA 137 136  K 4.6 3.9  CL 104 103  CO2 27 26  BUN 10 13  CREATININE 0.93 0.96  GLUCOSE 99 69  CALCIUM 8.1* 7.8*    Recent Labs  01/08/15 0008  INR 1.28    Physical Exam:  Neurologically intact ABD soft Neurovascular intact Sensation intact distally Intact pulses distally Dorsiflexion/Plantar flexion intact Incision: dressing C/D/I and no drainage No cellulitis present Compartment soft Patient is A&O x 3 this mornign to persom place and time. he appears in better spirits and his wife states he is acting more like himself.  Assessment/Plan:  3 Days Post-Op Procedure(s) (LRB): TOTAL KNEE ARTHROPLASTY (Right) Advance diet Up with  therapy Discharge to SNF once cleared by the medical team and also PT. We greatly appreciate medical management. Continue on ASA 325mg  BID x 2 weeks post op for DVT prevention. Follow up in office 2 weeks post op. I encourage him to use incentive spirometer regularly and his wife will help remind him to do this. Continue on current pain meds as his head seems to be clearer and pain is adequately controlled.    Maitland Muhlbauer, 01/09/2015, 8:08 AM

## 2015-01-09 NOTE — Progress Notes (Signed)
Physical Therapy Treatment Patient Details Name: Andrew Winters MRN: [id number] DOB: 08/09/1940 Today's Date: 01/09/2015    History of Present Illness R TKA    PT Comments    Patient able to ambulate 5 feet today but same distance as previous day despite encouragement to increase distance. Requiring a seated rest after ambulation. Patient also needing verbal cues for gait sequence inconsistently. Sit to stand still requiring assist of +2 with heavy encouragement for patient effort to stand. Continued slow mobilization, recommending SNF for further rehabilitation.   Follow Up Recommendations  SNF     Equipment Recommendations  Rolling walker with 5" wheels (XL bariatric)    Recommendations for Other Services       Precautions / Restrictions Precautions Precautions: Fall;Knee Required Braces or Orthoses: Knee Immobilizer - Right Knee Immobilizer - Right: Other (comment) (not specified frequency) Restrictions Weight Bearing Restrictions: Yes RLE Weight Bearing: Weight bearing as tolerated    Mobility  Bed Mobility Overal bed mobility: Needs Assistance Bed Mobility: Supine to Sit     Supine to sit: Mod assist     General bed mobility comments: encouragement for effort with mobility throughout  Transfers Overall transfer level: Needs assistance Equipment used: Rolling walker (2 wheeled) Transfers: Sit to/from Stand Sit to Stand: +2 physical assistance;From elevated surface         General transfer comment: from bed to chair  Ambulation/Gait Ambulation/Gait assistance: Mod assist Ambulation Distance (Feet): 5 Feet Assistive device: Rolling walker (2 wheeled) Gait Pattern/deviations: Decreased step length - right;Decreased step length - left;Decreased weight shift to right         Stairs            Wheelchair Mobility    Modified Rankin (Stroke Patients Only)       Balance     Sitting balance-Leahy Scale: Fair       Standing balance-Leahy  Scale: Fair                      Cognition Arousal/Alertness: Awake/alert Behavior During Therapy: Flat affect Overall Cognitive Status: Within Functional Limits for tasks assessed                      Exercises      General Comments        Pertinent Vitals/Pain Pain Assessment: 0-10 Pain Score: 5  Pain Location: Rt knee Pain Descriptors / Indicators: Numbness;Sore Pain Intervention(s): Monitored during session    Home Living                      Prior Function            PT Goals (current goals can now be found in the care plan section) Progress towards PT goals: Not progressing toward goals - comment (Patient slow to mobilize, still requiring assist +2)    Frequency  7X/week    PT Plan Current plan remains appropriate    Co-evaluation             End of Session Equipment Utilized During Treatment: Gait belt;Right knee immobilizer Activity Tolerance: Patient limited by fatigue Patient left: in chair;with call bell/phone within reach     Time: 3664-4034 PT Time Calculation (min) (ACUTE ONLY): 25 min  Charges:  $Gait Training: 8-22 mins $Therapeutic Activity: 8-22 mins                    G Codes:  Christiane Ha, PT, CSCS Pager [phone] Office [phone]  01/09/2015, 10:42 AM

## 2015-01-09 NOTE — Progress Notes (Signed)
Physical Therapy Treatment Patient Details Name: Andrew Winters MRN: [id number] DOB: 1941-06-23 Today's Date: 01/09/2015    History of Present Illness R TKA    PT Comments    Patient at venous study at first afternoon session attempt. Talk with nursing, patient to be going for further testing this afternoon as well for possible infection in the Rt knee. Will resume PT sessions as appropriate.                       Delton See 01/09/2015, 3:39 PM

## 2015-01-09 NOTE — Clinical Social Work Placement (Signed)
   CLINICAL SOCIAL WORK PLACEMENT  NOTE  Date:  01/09/2015  Patient Details  Name: Andrew Winters MRN: [id number] Date of Birth: 09-28-1940  Clinical Social Work is seeking post-discharge placement for this patient at the Skilled  Nursing Facility level of care (*CSW will initial, date and re-position this form in  chart as items are completed):  Yes   Patient/family provided with [facility] Clinical Social Work Department's list of facilities offering this level of care within the geographic area requested by the patient (or if unable, by the patient's family).  Yes   Patient/family informed of their freedom to choose among providers that offer the needed level of care, that participate in Medicare, Medicaid or managed care program needed by the patient, have an available bed and are willing to accept the patient.  Yes   Patient/family informed of [facility]'s ownership interest in [facility] and [facility], as well as of the fact that they are under no obligation to receive care at these facilities.  PASRR submitted to EDS on 01/09/15     PASRR number received on 01/09/15     Existing PASRR number confirmed on  (n/a)     FL2 transmitted to all facilities in geographic area requested by pt/family on 01/09/15     FL2 transmitted to all facilities within larger geographic area on 01/09/15     Patient informed that his/her managed care company has contracts with or will negotiate with certain facilities, including the following:   (yes, [facility] / [facility])         Patient/family informed of bed offers received.  Patient chooses bed at       Physician recommends and patient chooses bed at      Patient to be transferred to   on  .  Patient to be transferred to facility by       Patient family notified on   of transfer.  Name of family member notified:        PHYSICIAN Please sign FL2     Additional Comment:     _______________________________________________ Rod Mae, LCSW 01/09/2015, 3:52 PM [phone]

## 2015-01-09 NOTE — Care Management (Signed)
Utilization review completed. Oneisha Ammons, RN Case Manager [phone]. 

## 2015-01-09 NOTE — Clinical Social Work Note (Signed)
Clinical Social Work Assessment  Patient Details  Name: Andrew Winters MRN: [id number] Date of Birth: 31-Oct-1940  Date of referral:  01/09/15               Reason for consult:  Facility Placement, Discharge Planning                Permission sought to share information with:  Family Supports, Magazine features editor Permission granted to share information::     Name::     Andrew Winters  Agency::   ([facility] / [employer])  Relationship::   (wife)  Contact Information:  [phone]  Housing/Transportation Living arrangements for the past 2 months:  Single Family Home Source of Information:  Medical Team, Spouse Patient Interpreter Needed:  None Criminal Activity/Legal Involvement Pertinent to Current Situation/Hospitalization:  No - Comment as needed Significant Relationships:  Spouse Lives with:  Spouse Do you feel safe going back to the place where you live?  No (High fall risk.) Need for family participation in patient care:  Yes (Comment)  Care giving concerns:  No concerns expressed at this time.  Social Worker assessment / plan:  CSW received referral for possible SNF placement. Patient and patient's wife agreeable to SNF placement at time of discharge. CSW to continue to follow and assist with discharge planning needs.  Employment status:  Retired Medical illustrator) PT Recommendations:  Skilled Nursing Facility Information / Referral to community resources:  Skilled Nursing Facility  Patient/Family's Response to care:  Patient understanding and agreeable to CSW plan of care.  Patient/Family's Understanding of and Emotional Response to Diagnosis, Current Treatment, and Prognosis:  Patient understanding and agreeable to CSW plan of care.  Emotional Assessment Appearance:  Appears stated age Attitude/Demeanor/Rapport:  Other (CSW spoke with patient's wife.) Affect (typically observed):  Other (CSW spoke with patient's  wife.) Orientation:  Oriented to Self, Oriented to Place, Oriented to Situation, Oriented to  Time Alcohol / Substance use:  Not Applicable Psych involvement (Current and /or in the community):  No (Comment) (Not appropriate on this admission.)  Discharge Needs  Concerns to be addressed:  No discharge needs identified Readmission within the last 30 days:  No Current discharge risk:  None Barriers to Discharge:  No Barriers Identified   Rod Mae, LCSW 01/09/2015, 3:51 PM [phone]

## 2015-01-09 NOTE — Progress Notes (Signed)
[facility] PROGRESS NOTE  Andrew Winters JOA:[id number] DOB: 08-10-1940 DOA: 01/06/2015 PCP: Crist Fat, MD  Assessment/Plan: #1 systemic inflammatory response syndrome Unknown etiology. Patient still spiking fevers. Patient has been pancultured and cultures are pending. Lactic acid level within normal limits. Pro calcitonin within normal limits. Urinalysis leukocytes negative, nitrites negative. Chest x-ray negative for any acute infiltrate. Repeat chest x-ray is negative. CT chest negative for PE. Lower extremity Dopplers negative for DVT. Will check plain films of the right knee as right knee with some tightness, erythema, warmth, tenderness to palpation. May need to rule out septic knee. Patient's white count although is normal. Continue empiric IV vancomycin and IV Zosyn.  #2 postop fever/fever Unknown etiology. Patient with a normal white count. Patient has been pancultured and cultures are pending. Urinalysis is negative for UTI. Chest x-ray is negative for any acute infiltrate. Blood cultures are pending. Repeat chest x-ray is negative. CT chest negative for PE. Lower extremity Dopplers negative for DVT. Patient however continues to spike fever and a such will check plain films of the right knee to rule out septic joint. Orthopedics also managing patient's right knee. Continue empiric IV vancomycin and IV Zosyn. If patient continues to spike a fever with unknown source may need to consult with ID.  #3 acute encephalopathy with history of dementia Likely secondary to metabolic encephalopathy. Patient with clinical improvement and close to baseline per wife. Patient has been pancultured and cultures are pending. Continue empiric IV antibiotics. Follow.  #4. hypertension HCTZ on hold. Continue current regimen. Follow.  #5 diabetes mellitus type 2 Check a hemoglobin A1c. CBGs have ranged from 66-146. Discontinued oral hypoglycemic agents. Continue sliding scale insulin.  #6 right  knee osteoarthritis Status post right TKA. Dr. Jerl Santos 01/06/2015  #7 prophylaxis Aspirin for DVT prophylaxis per primary team.  Code Status: Full Family Communication: Updated patient. No family at bedside. Disposition Plan: Probably to skilled nursing facility once medically stable and afebrile. Per primary team.   Consultants:  [facility]; Dr Toniann Fail 01/07/2015  Procedures:  CT head 01/08/2015  Chest x-ray 01/07/2015  R TKA perr Dr Jerl Santos 01/06/15  CT angiogram chest 01/08/2015  Lower extremity Dopplers 01/09/2015  Antibiotics:  IV vancomycin 01/07/2015  IV Zosyn 01/07/2015  HPI/Subjective: Patient states he's not feeling too well. Patient complaining of some knee pain. Patient stated ambulated in the hallway with physical therapy today. Patient denies any shortness of breath. No chest pain.  Objective: Filed Vitals:   01/09/15 0600  BP: 113/67  Pulse: 83  Temp: 99 F (37.2 C)  Resp: 16    Intake/Output Summary (Last 24 hours) at 01/09/15 1513 Last data filed at 01/09/15 0825  Gross per 24 hour  Intake    120 ml  Output   1750 ml  Net  -1630 ml   Filed Weights   01/06/15 0818  Weight: 127.461 kg (281 lb)    Exam:   General:  NAD  Cardiovascular: RRR  Respiratory: CTAB anterior lung fields  Abdomen: Soft, nontender, nondistended, positive bowel sounds  Musculoskeletal: No clubbing cyanosis. Right knee is tight, positive warmth, tenderness to palpation, erythematous.  Data Reviewed: Basic Metabolic Panel:  Recent Labs Lab 01/07/15 0444 01/08/15 0008 01/09/15 0519  NA 138 137 136  K 4.4 4.6 3.9  CL 101 104 103  CO2 28 27 26   GLUCOSE 110* 99 69  BUN 13 10 13   CREATININE 0.93 0.93 0.96  CALCIUM 8.3* 8.1* 7.8*   Liver Function Tests:  Recent  Labs Lab 01/08/15 0008  AST 27  ALT 12*  ALKPHOS 53  BILITOT 0.5  PROT 5.9*  ALBUMIN 3.0*   No results for input(s): LIPASE, AMYLASE in the last 168 hours. No results for  input(s): AMMONIA in the last 168 hours. CBC:  Recent Labs Lab 01/07/15 0444 01/08/15 0008 01/08/15 0412 01/09/15 0519  WBC 5.5 7.1 7.3 6.9  NEUTROABS  --  5.1  --   --   HGB 11.3* 10.3* 10.1* 9.3*  HCT 35.2* 32.0* 31.8* 28.8*  MCV 85.6 84.7 84.1 84.5  PLT 159 144* 147* 128*   Cardiac Enzymes: No results for input(s): CKTOTAL, CKMB, CKMBINDEX, TROPONINI in the last 168 hours. BNP (last 3 results) No results for input(s): BNP in the last 8760 hours.  ProBNP (last 3 results) No results for input(s): PROBNP in the last 8760 hours.  CBG:  Recent Labs Lab 01/08/15 2358 01/09/15 0634 01/09/15 0714 01/09/15 1136 01/09/15 1152  GLUCAP 111* 66 113* 128* 146*    Recent Results (from the past 240 hour(s))  Culture, Urine     Status: None   Collection Time: 01/07/15 10:20 PM  Result Value Ref Range Status   Specimen Description URINE, CLEAN CATCH  Final   Special Requests NONE  Final   Culture NO GROWTH 1 DAY  Final   Report Status 01/09/2015 FINAL  Final  Culture, blood (routine x 2)     Status: None (Preliminary result)   Collection Time: 01/07/15 11:44 PM  Result Value Ref Range Status   Specimen Description BLOOD LEFT HAND  Final   Special Requests BOTTLES DRAWN AEROBIC ONLY 6CC  Final   Culture NO GROWTH 1 DAY  Final   Report Status PENDING  Incomplete  Culture, blood (routine x 2)     Status: None (Preliminary result)   Collection Time: 01/08/15 12:08 AM  Result Value Ref Range Status   Specimen Description BLOOD RIGHT ARM  Final   Special Requests BOTTLES DRAWN AEROBIC AND ANAEROBIC 10CC EACH  Final   Culture NO GROWTH 1 DAY  Final   Report Status PENDING  Incomplete     Studies: Dg Chest 1 View  01/07/2015   CLINICAL DATA:  Disoriented.  Wheezing.  EXAM: CHEST  1 VIEW  COMPARISON:  12/25/2014  FINDINGS: There is mild unchanged cardiomegaly. The lungs are grossly clear. There is no large effusion. There is no pneumothorax.  IMPRESSION: No acute cardiopulmonary  findings.   Electronically Signed   By: Ellery Plunk M.D.   On: 01/07/2015 21:32   Ct Head Wo Contrast  01/08/2015   CLINICAL DATA:  Acute encephalopathy.  EXAM: CT HEAD WITHOUT CONTRAST  TECHNIQUE: Contiguous axial images were obtained from the base of the skull through the vertex without intravenous contrast.  COMPARISON:  07/03/2011.  FINDINGS: No evidence of an acute infarct, acute hemorrhage, mass lesion, mass effect or hydrocephalus. Atrophy. Minimal periventricular low attenuation. No air-fluid levels in the paranasal sinuses or mastoid air cells.  IMPRESSION: 1. No acute intracranial abnormality. 2. Atrophy and mild chronic microvascular white matter ischemic changes.   Electronically Signed   By: Leanna Battles M.D.   On: 01/08/2015 08:29   Ct Angio Chest Pe W/cm &/or Wo Cm  01/08/2015   CLINICAL DATA:  Recent right knee replacement with chest pain and shortness of Breath  EXAM: CT ANGIOGRAPHY CHEST WITH CONTRAST  TECHNIQUE: Multidetector CT imaging of the chest was performed using the standard protocol during bolus administration of intravenous contrast.  Multiplanar CT image reconstructions and MIPs were obtained to evaluate the vascular anatomy.  CONTRAST:  OMNIPAQUE IOHEXOL 350 MG/ML SOLN  COMPARISON:  None.  FINDINGS: The lungs are well aerated bilaterally. Very minimal dependent atelectatic changes are seen. No focal infiltrate or sizable effusion is noted. The thoracic inlet is within normal limits. The thoracic aorta and its branches are unremarkable. The pulmonary artery shows a normal branching pattern. No definitive pulmonary embolism is identified. Opacification of the artery is somewhat decreased. No evidence of right heart strain is seen. Calcified hilar and mediastinal lymph nodes are noted consistent with prior granulomatous disease.  The visualized upper abdomen shows no acute abnormality. The osseous structures show degenerative change of thoracic spine.  Review of the MIP  images confirms the above findings.  IMPRESSION: No focal infiltrate noted.  No definitive pulmonary embolism identified although opacification of the pulmonary artery is somewhat decreased. This may be related patient's body habitus.   Electronically Signed   By: Alcide Clever M.D.   On: 01/08/2015 20:38   Dg Chest Port 1 View  01/09/2015   CLINICAL DATA:  Fever.  Recent total knee replacement  EXAM: PORTABLE CHEST - 1 VIEW  COMPARISON:  Chest radiograph January 07, 2015; chest CT January 08, 2015  FINDINGS: There is no edema or consolidation. Heart is upper normal in size with pulmonary vascularity within normal limits. No adenopathy. There is degenerative change in the thoracic spine.  IMPRESSION: No edema or consolidation.   Electronically Signed   By: Bretta Bang III M.D.   On: 01/09/2015 08:08    Scheduled Meds: . amLODipine  5 mg Oral Daily  . aspirin EC  325 mg Oral BID PC  . docusate sodium  100 mg Oral BID  . donepezil  5 mg Oral QHS  . fluticasone  1 spray Each Nare Daily  . gabapentin  100 mg Oral TID  . insulin aspart  0-20 Units Subcutaneous TID WC  . lisinopril  2.5 mg Oral BID  . pantoprazole  40 mg Oral Daily  . piperacillin-tazobactam (ZOSYN)  IV  3.375 g Intravenous 3 times per day  . sertraline  25 mg Oral Daily  . traZODone  50 mg Oral QHS  . vancomycin  1,000 mg Intravenous Q12H   Continuous Infusions:    Principal Problem:   Primary osteoarthritis of right knee Active Problems:   SIRS (systemic inflammatory response syndrome)   Fever   GERD   Diabetes   Morbid obesity   Diabetes mellitus type 2, controlled   Essential hypertension   Acute encephalopathy    Time spent: 40 minutes    [facility] MD [facility] Pager [phone]. If 7PM-7AM, please contact night-coverage at [email or web address], password [facility] 01/09/2015, 3:13 PM  LOS: 3 days

## 2015-01-09 NOTE — Progress Notes (Signed)
Inpatient Diabetes Program Recommendations  AACE/ADA: New Consensus Statement on Inpatient Glycemic Control (2013)  Target Ranges:  Prepandial:   less than 140 mg/dL      Peak postprandial:   less than 180 mg/dL (1-2 hours)      Critically ill patients:  140 - 180 mg/dL  Results for Andrew Winters, Andrew Winters (MRN [id number]) as of 01/09/2015 08:58  Ref. Range 01/08/2015 21:38 01/08/2015 22:18 01/08/2015 23:58 01/09/2015 06:34 01/09/2015 07:14  Glucose-Capillary Latest Ref Range: 65-99 mg/dL 49 (L) 90 267 (H) 66 124 (H)   Diabetes history: Type 2 diabetes Outpatient Diabetes medications: Amaryl 4 mg daily, Metformin 1000 mg q AM and 500 mg q PM, Onglyza 2.5 mg daily Current orders for Inpatient glycemic control:  Novolog resist tid with meals, Amaryl 4 mg daily  Note hypoglycemia.  Consider d/c of Amaryl and decrease Novolog correction to sensitive.   Thanks, Beryl Meager, RN, BC-ADM Inpatient Diabetes Coordinator Pager [phone] (8a-5p)

## 2015-01-09 NOTE — Progress Notes (Signed)
Bilateral lower extremity venous duplex completed:  No evidence of DVT, superficial thrombosis, or Baker's cyst.  Technically difficult study due to the patient's body habitus.   

## 2015-01-10 DIAGNOSIS — R600 Localized edema: Secondary | ICD-10-CM

## 2015-01-10 DIAGNOSIS — Z96651 Presence of right artificial knee joint: Secondary | ICD-10-CM

## 2015-01-10 DIAGNOSIS — R5082 Postprocedural fever: Secondary | ICD-10-CM

## 2015-01-10 DIAGNOSIS — E119 Type 2 diabetes mellitus without complications: Secondary | ICD-10-CM

## 2015-01-10 DIAGNOSIS — F0390 Unspecified dementia without behavioral disturbance: Secondary | ICD-10-CM

## 2015-01-10 LAB — BASIC METABOLIC PANEL
Anion gap: 6 (ref 5–15)
BUN: 14 mg/dL (ref 6–20)
CHLORIDE: 101 mmol/L (ref 101–111)
CO2: 27 mmol/L (ref 22–32)
Calcium: 7.9 mg/dL — ABNORMAL LOW (ref 8.9–10.3)
Creatinine, Ser: 0.96 mg/dL (ref 0.61–1.24)
GFR calc Af Amer: >60 mL/min (ref >60)
GFR calc non Af Amer: >60 mL/min (ref >60)
GLUCOSE: 136 mg/dL — AB (ref 65–99)
Potassium: 3.6 mmol/L (ref 3.5–5.1)
SODIUM: 134 mmol/L — AB (ref 135–145)

## 2015-01-10 LAB — CBC WITH DIFFERENTIAL/PLATELET
BASOS PCT: 0 % (ref 0–1)
Basophils Absolute: 0 10*3/uL (ref 0.0–0.1)
EOS ABS: 0.2 10*3/uL (ref 0.0–0.7)
EOS PCT: 3 % (ref 0–5)
HCT: 27.5 % — ABNORMAL LOW (ref 39.0–52.0)
Hemoglobin: 8.9 g/dL — ABNORMAL LOW (ref 13.0–17.0)
Lymphocytes Relative: 16 % (ref 12–46)
Lymphs Abs: 1 10*3/uL (ref 0.7–4.0)
MCH: 27.1 pg (ref 26.0–34.0)
MCHC: 32.4 g/dL (ref 30.0–36.0)
MCV: 83.6 fL (ref 78.0–100.0)
MONO ABS: 0.8 10*3/uL (ref 0.1–1.0)
Monocytes Relative: 13 % — ABNORMAL HIGH (ref 3–12)
Neutro Abs: 4.4 10*3/uL (ref 1.7–7.7)
Neutrophils Relative %: 68 % (ref 43–77)
Platelets: 151 10*3/uL (ref 150–400)
RBC: 3.29 MIL/uL — ABNORMAL LOW (ref 4.22–5.81)
RDW: 17 % — ABNORMAL HIGH (ref 11.5–15.5)
WBC: 6.3 10*3/uL (ref 4.0–10.5)

## 2015-01-10 LAB — VANCOMYCIN, TROUGH: VANCOMYCIN TR: 10 ug/mL (ref 10.0–20.0)

## 2015-01-10 LAB — GLUCOSE, CAPILLARY
GLUCOSE-CAPILLARY: 147 mg/dL — AB (ref 65–99)
Glucose-Capillary: 141 mg/dL — ABNORMAL HIGH (ref 65–99)
Glucose-Capillary: 117 mg/dL — ABNORMAL HIGH (ref 65–99)
Glucose-Capillary: 149 mg/dL — ABNORMAL HIGH (ref 65–99)

## 2015-01-10 MED ORDER — ENOXAPARIN SODIUM 30 MG/0.3ML ~~LOC~~ SOLN
30.0000 mg | Freq: Two times a day (BID) | SUBCUTANEOUS | Status: DC
  Administered 2015-01-11: 30 mg via SUBCUTANEOUS
  Filled 2015-01-10: qty 0.3

## 2015-01-10 MED ORDER — VANCOMYCIN HCL IN DEXTROSE 1-5 GM/200ML-% IV SOLN
1000.0000 mg | Freq: Three times a day (TID) | INTRAVENOUS | Status: DC
  Administered 2015-01-10 – 2015-01-12 (×6): 1000 mg via INTRAVENOUS
  Filled 2015-01-10 (×8): qty 200

## 2015-01-10 NOTE — Progress Notes (Signed)
PATIENT ID: Andrew Winters  MRN: [id number]  DOB/AGE:  07/28/1940 / 74 y.o.  4 Days Post-Op Procedure(s) (LRB): TOTAL KNEE ARTHROPLASTY (Right)    PROGRESS NOTE Subjective: Patient is alert, oriented, no Nausea, no Vomiting, yes passing gas, yes Bowel Movement. Taking PO well with pt up in bed eating. Denies SOB, Chest or Calf Pain. Using Incentive Spirometer, PAS in place. Ambulate WBAT ,  Patient reports pain as 5 on 0-10 scale  .    Objective: Vital signs in last 24 hours: Filed Vitals:   01/09/15 1525 01/09/15 1956 01/10/15 0000 01/10/15 0600  BP: 127/51 116/63  115/54  Pulse: 106 98  79  Temp: 102.6 F (39.2 C) 100.7 F (38.2 C) 99.2 F (37.3 C) 99.1 F (37.3 C)  TempSrc: Oral Oral Oral Oral  Resp: 18 18  18   Weight:      SpO2: 98% 100%  99%      Intake/Output from previous day: I/O last 3 completed shifts: In: -  Out: 3200 [Urine:3200]   Intake/Output this shift: Total I/O In: 240 [P.O.:240] Out: -    LABORATORY DATA:  Recent Labs  01/08/15 0008  01/09/15 0519  01/09/15 1653 01/09/15 2118 01/10/15 0433 01/10/15 0636  WBC 7.1  < > 6.9  --   --   --  6.3  --   HGB 10.3*  < > 9.3*  --   --   --  8.9*  --   HCT 32.0*  < > 28.8*  --   --   --  27.5*  --   PLT 144*  < > 128*  --   --   --  151  --   NA 137  --  136  --   --   --  134*  --   K 4.6  --  3.9  --   --   --  3.6  --   CL 104  --  103  --   --   --  101  --   CO2 27  --  26  --   --   --  27  --   BUN 10  --  13  --   --   --  14  --   CREATININE 0.93  --  0.96  --   --   --  0.96  --   GLUCOSE 99  --  69  --   --   --  136*  --   GLUCAP  --   < >  --   < > 131* 139*  --  141*  INR 1.28  --   --   --   --   --   --   --   CALCIUM 8.1*  --  7.8*  --   --   --  7.9*  --   < > = values in this interval not displayed.  Examination: Neurologically intact Neurovascular intact Sensation intact distally Intact pulses distally Dorsiflexion/Plantar flexion intact Incision: no drainage No  cellulitis present Compartment soft}  Assessment:   4 Days Post-Op Procedure(s) (LRB): TOTAL KNEE ARTHROPLASTY (Right) ADDITIONAL DIAGNOSIS: Expected Acute Blood Loss Anemia,  Pt followed by medicine for systemic inflammatory response syndrome and post op fever with negative work up.  Plan: PT/OT WBAT, CPM 5/hrs day until ROM 0-90 degrees, then D/C CPM DVT Prophylaxis:  SCDx72hrs, ASA 325 mg BID x 2 weeks DISCHARGE PLAN: Skilled Nursing Facility/Rehab when cleared by medicine  and PT DISCHARGE NEEDS: HHPT, HHRN, CPM, Walker and 3-in-1 comode seat     Inaya Gillham R 01/10/2015, 8:13 AM

## 2015-01-10 NOTE — Progress Notes (Signed)
MEDICATION RELATED CONSULT NOTE - INITIAL   Pharmacy Consult for Iron Indication: Anemia   Allergies  Allergen Reactions  . Aspirin Diarrhea    Patient Measurements: Weight: 281 lb (127.461 kg) Adjusted Body Weight:   Vital Signs: Temp: 99.1 F (37.3 C) (07/02 0600) Temp Source: Oral (07/02 0600) BP: 115/54 mmHg (07/02 0600) Pulse Rate: 79 (07/02 0600) Intake/Output from previous day: 07/01 0701 - 07/02 0700 In: -  Out: 2950 [Urine:2950] Intake/Output from this shift: Total I/O In: 240 [P.O.:240] Out: -   Labs:  Recent Labs  01/08/15 0008 01/08/15 0412 01/09/15 0519 01/10/15 0433  WBC 7.1 7.3 6.9 6.3  HGB 10.3* 10.1* 9.3* 8.9*  HCT 32.0* 31.8* 28.8* 27.5*  PLT 144* 147* 128* 151  APTT 40*  --   --   --   CREATININE 0.93  --  0.96 0.96  ALBUMIN 3.0*  --   --   --   PROT 5.9*  --   --   --   AST 27  --   --   --   ALT 12*  --   --   --   ALKPHOS 53  --   --   --   BILITOT 0.5  --   --   --   BILIDIR 0.2  --   --   --   IBILI 0.3  --   --   --    Estimated Creatinine Clearance: 95.9 mL/min (by C-G formula based on Cr of 0.96).   Microbiology: 6/29 Urine>> neg 6/29 Bld x2>> pending  Assessment: 73 y.o. male s/p R TKR 6/28. Pt spiked temperature of 103.4 tonight. HR up to 116. WBC wnl. To begin broad spectrum abx (Vancomcyin and Zosyn) for r/o sepsis.  Infectious Disease: Vanc/Zosyn for r/o sepsis (post-op fever). Tm 102.6. Tc 99.1. WBC wnl, SCr 0.96, CrCl~95. LA and PCT WNL. UA negative, CXR negative. R/o septic knee next.  Hematology / Oncology: Hx RA. Hgb 8.9 post-op, plts 151 up Iron 10 (low), TIBC 248 ok, Sat 4% very low, Ferritin 94 ok, Folate 15.5 Ok. Have discussed the risks of IV iron with Dr. Janee Morn while still r/o sepsis. Will start po iron today and re-consider IV iron in 1-2 more days if fevers subside.  Infections and IV Iron: o Elemental iron is an essential growth factor for bacteria. Observational studies have linked elevated ferritin  to increased infections. This risk has yet to be substantiated with randomized, controlled studies but given the theoretical risk and limited evidence that IV iron may promote infections and worsen outcomes, exercising caution in this situation is advisable. Consider withholding IV iron in the setting of serious bacterial infections or ferritin values >800 ng/mL. Reassess need for IV iron if patient has been on antibiotics for a minimum of 3 days and has defervesced.  Goal of Therapy:  Treatment of anemia and low iron stores.  Plan:  Ferrous sulfate 325mg  po x TIDWD Will maintain IV iron pharmacy protocol and reconsider in 1-2 more days.   Jailan Trimm S. , PharmD, BCPS Clinical Staff Pharmacist Pager [phone]  [phone] [place] 01/10/2015,8:24 AM

## 2015-01-10 NOTE — Progress Notes (Signed)
[facility] PROGRESS NOTE  Andrew Winters KZS:[id number] DOB: [birth year] DOA: 01/06/2015 PCP: Crist Fat, MD  Assessment/Plan: #1 systemic inflammatory response syndrome Unknown etiology. Patient still spiking fevers. Patient has been pancultured and cultures are pending. Lactic acid level within normal limits. Pro calcitonin within normal limits. Urinalysis leukocytes negative, nitrites negative. Chest x-ray negative for any acute infiltrate. Repeat chest x-ray is negative. CT chest negative for PE. Lower extremity Dopplers negative for DVT. Plain films of the right knee negative with postop changes. Clinical exam of the right knee with some tightness, erythema, warmth, tenderness to palpation. Patient's white count although is normal. Continue empiric IV vancomycin and IV Zosyn. ID has been consulted please see the note. Please see problem #2.  #2 postop fever/fever Unknown etiology. Patient with a normal white count. Patient has been pancultured and cultures are pending. Urinalysis is negative for UTI. Chest x-ray is negative for any acute infiltrate. Blood cultures are pending. Repeat chest x-ray is negative. CT chest negative for PE. Lower extremity Dopplers negative for DVT. Plain films of the right knee with no acute abnormalities however does show postop changes. Patient however continues to spike fever and as such have consulted with infectious diseases who feel may be secondary to aspirin drug reaction versus possible knee infection. Doppler ultrasound of the left upper extremity has been ordered per infectious disease. Patient's right knee on examination is warm tight with tenderness to palpation. I have discussed with the orthopedics PA who has discussed with his attending and aspirin is to be discontinued and patient placed on Lovenox for DVT prophylaxis. Orthopedics to possibly aspirate the right knee tomorrow. Orthopedics also managing patient's right knee. Continue empiric IV  vancomycin and IV Zosyn.   #3 acute encephalopathy with history of dementia Likely secondary to metabolic encephalopathy. Patient with clinical improvement and close to baseline per wife. Patient has been pancultured and cultures are pending. Continue empiric IV antibiotics. Follow.  #4. hypertension HCTZ on hold. Continue current regimen. Follow.  #5 diabetes mellitus type 2 Check a hemoglobin A1c. CBGs have ranged from 117-149. Discontinued oral hypoglycemic agents. Continue sliding scale insulin.  #6 right knee osteoarthritis Status post right TKA. Dr. Jerl Santos 01/06/2015  #7 prophylaxis Discontinue aspirin due to concerns for possible drug fever .Lovenox for DVT prophylaxis per primary team. Discussed with PA of orthopedics.  Code Status: Full Family Communication: Updated patient. No family at bedside. Disposition Plan: Probably to skilled nursing facility once medically stable and afebrile. Per primary team.   Consultants:  [facility]; Dr Toniann Fail 01/07/2015  Infectious disease: Dr. Ninetta Lights 01/10/2015  Procedures:  CT head 01/08/2015  Chest x-ray 01/07/2015  R TKA perr Dr Jerl Santos 01/06/15  CT angiogram chest 01/08/2015  Lower extremity Dopplers 01/09/2015  Antibiotics:  IV vancomycin 01/07/2015  IV Zosyn 01/07/2015  HPI/Subjective: Patient denies any shortness of breath. No chest pain. Patient with complaints of some knee pain.  Objective: Filed Vitals:   01/10/15 0600  BP: 115/54  Pulse: 79  Temp: 99.1 F (37.3 C)  Resp: 18    Intake/Output Summary (Last 24 hours) at 01/10/15 1401 Last data filed at 01/10/15 1320  Gross per 24 hour  Intake    480 ml  Output   2650 ml  Net  -2170 ml   Filed Weights   01/06/15 0818  Weight: 127.461 kg (281 lb)    Exam:   General:  NAD  Cardiovascular: RRR  Respiratory: CTAB anterior lung fields  Abdomen: Soft, nontender, nondistended, positive bowel  sounds  Musculoskeletal: No clubbing  cyanosis. Right knee is tight, positive warmth, tenderness to palpation, erythematous. Left upper extremity swelling likely secondary to IV infiltration.  Data Reviewed: Basic Metabolic Panel:  Recent Labs Lab 01/07/15 0444 01/08/15 0008 01/09/15 0519 01/10/15 0433  NA 138 137 136 134*  K 4.4 4.6 3.9 3.6  CL 101 104 103 101  CO2 28 27 26 27   GLUCOSE 110* 99 69 136*  BUN 13 10 13 14   CREATININE 0.93 0.93 0.96 0.96  CALCIUM 8.3* 8.1* 7.8* 7.9*   Liver Function Tests:  Recent Labs Lab 01/08/15 0008  AST 27  ALT 12*  ALKPHOS 53  BILITOT 0.5  PROT 5.9*  ALBUMIN 3.0*   No results for input(s): LIPASE, AMYLASE in the last 168 hours. No results for input(s): AMMONIA in the last 168 hours. CBC:  Recent Labs Lab 01/07/15 0444 01/08/15 0008 01/08/15 0412 01/09/15 0519 01/10/15 0433  WBC 5.5 7.1 7.3 6.9 6.3  NEUTROABS  --  5.1  --   --  4.4  HGB 11.3* 10.3* 10.1* 9.3* 8.9*  HCT 35.2* 32.0* 31.8* 28.8* 27.5*  MCV 85.6 84.7 84.1 84.5 83.6  PLT 159 144* 147* 128* 151   Cardiac Enzymes: No results for input(s): CKTOTAL, CKMB, CKMBINDEX, TROPONINI in the last 168 hours. BNP (last 3 results) No results for input(s): BNP in the last 8760 hours.  ProBNP (last 3 results) No results for input(s): PROBNP in the last 8760 hours.  CBG:  Recent Labs Lab 01/09/15 1152 01/09/15 1653 01/09/15 2118 01/10/15 0636 01/10/15 1203  GLUCAP 146* 131* 139* 141* 117*    Recent Results (from the past 240 hour(s))  Culture, Urine     Status: None   Collection Time: 01/07/15 10:20 PM  Result Value Ref Range Status   Specimen Description URINE, CLEAN CATCH  Final   Special Requests NONE  Final   Culture NO GROWTH 1 DAY  Final   Report Status 01/09/2015 FINAL  Final  Culture, blood (routine x 2)     Status: None (Preliminary result)   Collection Time: 01/07/15 11:44 PM  Result Value Ref Range Status   Specimen Description BLOOD LEFT HAND  Final   Special Requests BOTTLES DRAWN  AEROBIC ONLY 6CC  Final   Culture NO GROWTH 2 DAYS  Final   Report Status PENDING  Incomplete  Culture, blood (routine x 2)     Status: None (Preliminary result)   Collection Time: 01/08/15 12:08 AM  Result Value Ref Range Status   Specimen Description BLOOD RIGHT ARM  Final   Special Requests BOTTLES DRAWN AEROBIC AND ANAEROBIC 10CC EACH  Final   Culture NO GROWTH 2 DAYS  Final   Report Status PENDING  Incomplete     Studies: Ct Angio Chest Pe W/cm &/or Wo Cm  01/08/2015   CLINICAL DATA:  Recent right knee replacement with chest pain and shortness of Breath  EXAM: CT ANGIOGRAPHY CHEST WITH CONTRAST  TECHNIQUE: Multidetector CT imaging of the chest was performed using the standard protocol during bolus administration of intravenous contrast. Multiplanar CT image reconstructions and MIPs were obtained to evaluate the vascular anatomy.  CONTRAST:  01/10/15 OMNIPAQUE IOHEXOL 350 MG/ML SOLN  COMPARISON:  None.  FINDINGS: The lungs are well aerated bilaterally. Very minimal dependent atelectatic changes are seen. No focal infiltrate or sizable effusion is noted. The thoracic inlet is within normal limits. The thoracic aorta and its branches are unremarkable. The pulmonary artery shows a normal branching pattern.  No definitive pulmonary embolism is identified. Opacification of the artery is somewhat decreased. No evidence of right heart strain is seen. Calcified hilar and mediastinal lymph nodes are noted consistent with prior granulomatous disease.  The visualized upper abdomen shows no acute abnormality. The osseous structures show degenerative change of thoracic spine.  Review of the MIP images confirms the above findings.  IMPRESSION: No focal infiltrate noted.  No definitive pulmonary embolism identified although opacification of the pulmonary artery is somewhat decreased. This may be related patient's body habitus.   Electronically Signed   By: Alcide Clever M.D.   On: 01/08/2015 20:38   Dg Chest Port 1  View  01/09/2015   CLINICAL DATA:  Fever.  Recent total knee replacement  EXAM: PORTABLE CHEST - 1 VIEW  COMPARISON:  Chest radiograph January 07, 2015; chest CT January 08, 2015  FINDINGS: There is no edema or consolidation. Heart is upper normal in size with pulmonary vascularity within normal limits. No adenopathy. There is degenerative change in the thoracic spine.  IMPRESSION: No edema or consolidation.   Electronically Signed   By: Bretta Bang III M.D.   On: 01/09/2015 08:08   Dg Knee Complete 4 Views Right  01/09/2015   CLINICAL DATA:  Right knee surgery on 06/28, postop fever  EXAM: RIGHT KNEE - COMPLETE 4+ VIEW  COMPARISON:  Right knee radiographs dated 07/06/2011  FINDINGS: Right knee arthroplasty.  No evidence of hardware fracture or loosening.  No fracture or dislocation is seen.  Mild soft tissue swelling.  IMPRESSION: Right knee arthroplasty without evidence of complication.   Electronically Signed   By: Charline Bills M.D.   On: 01/09/2015 16:29    Scheduled Meds: . amLODipine  5 mg Oral Daily  . aspirin EC  325 mg Oral BID PC  . docusate sodium  100 mg Oral BID  . donepezil  5 mg Oral QHS  . fluticasone  1 spray Each Nare Daily  . gabapentin  100 mg Oral TID  . insulin aspart  0-9 Units Subcutaneous TID WC  . lisinopril  2.5 mg Oral BID  . pantoprazole  40 mg Oral Daily  . piperacillin-tazobactam (ZOSYN)  IV  3.375 g Intravenous 3 times per day  . sertraline  25 mg Oral Daily  . traZODone  50 mg Oral QHS  . vancomycin  1,000 mg Intravenous Q8H   Continuous Infusions:    Principal Problem:   Primary osteoarthritis of right knee Active Problems:   SIRS (systemic inflammatory response syndrome)   Fever   GERD   Diabetes   Morbid obesity   Diabetes mellitus type 2, controlled   Essential hypertension   Acute encephalopathy   Postoperative fever    Time spent: 40 minutes    [facility] MD [facility] Pager [phone]. If 7PM-7AM, please contact  night-coverage at [email or web address], password [facility] 01/10/2015, 2:01 PM  LOS: 4 days

## 2015-01-10 NOTE — Progress Notes (Signed)
Physical Therapy Treatment Patient Details Name: Andrew Winters MRN: [id number] DOB: [birth year] Today's Date: 01/10/2015    History of Present Illness R TKA.    PT Comments    Patient progressing slowly towards PT goals. Improved ambulation distance with encouragement. Continues to require assist of 2 to stand. Knee ROM improving. Pt continues to be appropriate for ST SNF at d/c. Will continue to follow to maximize independence and mobility.   Follow Up Recommendations  SNF     Equipment Recommendations  Rolling walker with 5" wheels    Recommendations for Other Services       Precautions / Restrictions Precautions Precautions: Fall;Knee Precaution Booklet Issued: No Precaution Comments: Reviewed no pillow under knee Required Braces or Orthoses: Knee Immobilizer - Right Knee Immobilizer - Right: Other (comment) Restrictions Weight Bearing Restrictions: Yes RLE Weight Bearing: Weight bearing as tolerated    Mobility  Bed Mobility Overal bed mobility: Needs Assistance Bed Mobility: Supine to Sit     Supine to sit: Mod assist;HOB elevated     General bed mobility comments: Mod A to elevate trunk and bring RLE to EOB. Cues for technique.   Transfers Overall transfer level: Needs assistance Equipment used: Rolling walker (2 wheeled) Transfers: Sit to/from Stand Sit to Stand: Max assist;+2 physical assistance         General transfer comment: Max A of 2 to boost up from bed with cues for hand placement and technique. Cues for uright posture.   Ambulation/Gait Ambulation/Gait assistance: Min assist Ambulation Distance (Feet): 65 Feet Assistive device: Rolling walker (2 wheeled) Gait Pattern/deviations: Decreased stance time - right;Decreased step length - left;Trunk flexed;Antalgic   Gait velocity interpretation: Below normal speed for age/gender General Gait Details: Slow, unsteady gait with cues for upright posture and RW management. Dyspnea present.     Stairs            Wheelchair Mobility    Modified Rankin (Stroke Patients Only)       Balance Overall balance assessment: Needs assistance Sitting-balance support: Feet supported;No upper extremity supported Sitting balance-Leahy Scale: Fair     Standing balance support: During functional activity Standing balance-Leahy Scale: Poor                      Cognition Arousal/Alertness: Awake/alert Behavior During Therapy: WFL for tasks assessed/performed Overall Cognitive Status: Within Functional Limits for tasks assessed                      Exercises Total Joint Exercises Ankle Circles/Pumps: Both;15 reps;Supine Quad Sets: Right;10 reps;Supine Goniometric ROM: 8-70 degrees knee AROM.    General Comments General comments (skin integrity, edema, etc.): Wife present during session.      Pertinent Vitals/Pain Pain Assessment: 0-10 Pain Score: 6  Pain Location: right knee Pain Descriptors / Indicators: Sore Pain Intervention(s): Monitored during session;Repositioned    Home Living                      Prior Function            PT Goals (current goals can now be found in the care plan section) Progress towards PT goals: Progressing toward goals    Frequency  7X/week    PT Plan Current plan remains appropriate    Co-evaluation             End of Session Equipment Utilized During Treatment: Gait belt;Right knee immobilizer Activity Tolerance: Patient tolerated treatment well  Patient left: in chair;with call bell/phone within reach;with family/visitor present     Time: 0820-0840 PT Time Calculation (min) (ACUTE ONLY): 20 min  Charges:  $Gait Training: 8-22 mins                    G Codes:      Hamza Empson A Salome Hautala 01/10/2015, 9:31 AM  Mylo Red, PT, DPT [phone]

## 2015-01-10 NOTE — Progress Notes (Addendum)
ANTIBIOTIC CONSULT NOTE - FOLLOW UP  Pharmacy Consult for Vanco/Zosyn Indication: rule out sepsis  Allergies  Allergen Reactions  . Aspirin Diarrhea    Patient Measurements: Weight: 281 lb (127.461 kg) Adjusted Body Weight:    Vital Signs: Temp: 99.1 F (37.3 C) (07/02 0600) Temp Source: Oral (07/02 0600) BP: 115/54 mmHg (07/02 0600) Pulse Rate: 79 (07/02 0600) Intake/Output from previous day: 07/01 0701 - 07/02 0700 In: -  Out: 2950 [Urine:2950] Intake/Output from this shift: Total I/O In: 480 [P.O.:480] Out: 300 [Urine:300]  Labs:  Recent Labs  01/08/15 0008 01/08/15 0412 01/09/15 0519 01/10/15 0433  WBC 7.1 7.3 6.9 6.3  HGB 10.3* 10.1* 9.3* 8.9*  PLT 144* 147* 128* 151  CREATININE 0.93  --  0.96 0.96   Estimated Creatinine Clearance: 95.9 mL/min (by C-G formula based on Cr of 0.96).  Recent Labs  01/10/15 1218  VANCOTROUGH 10    Assessment: 74 y.o. male s/p R TKR 6/28. Pt spiked temperature of 103.4 tonight. HR up to 116. WBC wnl. To begin broad spectrum abx (Vancomcyin and Zosyn) for r/o sepsis.  Infectious Disease: Vanc/Zosyn for r/o sepsis (post-op fever). Tm 102.6. Tc 99.1. WBC wnl, SCr 0.96, CrCl~95. LA and PCT WNL. UA negative, CXR negative. R/o septic knee next.  6/29 Zosyn>> 6/30 Vanc>> - 7/2: VT=10  6/29 Urine>> neg 6/29 Bld x2>> pending  Goal of Therapy:  Vancomycin trough level 15-20 mcg/ml  Plan:  Increase Vancomycin to 1g IV q8hr Zosyn 3.375g IV q8hr. Dose ok   Crystal S. Merilynn Finland, PharmD, BCPS Clinical Staff Pharmacist Pager [phone]  Misty Stanley Stillinger 01/10/2015,1:53 PM  Addendum  Pt is s/p TKR and lovenox has been ordered for DVT. He is morbidly obese.   Plan Lovenox 30mg  SQ BID  , PharmD Pager: [phone] 01/10/2015 4:06 PM

## 2015-01-10 NOTE — Consult Note (Signed)
[facility]  Date of Admission:  01/06/2015  Date of Consult:  01/10/2015  Reason for Consult: post-op fever Referring Physician: Thompson  Impression/Recommendation Post-op fever R TKR LUE edema DM2  Would Continue anbx for now Check doppler LUE Ortho to f/u his knee (quite warm and swollen)  Comment- Would be very early for post-op infection.  Could also consider drug allergy (ASA).   Thank you so much for this interesting consult,   Bobby Rumpf (pager) [phone] [email or web address]  Andrew Winters is an 74 y.o. male.  HPI: 74 yo M with hx of DM2 (~ 20 yrs), dementia, adm on 6-27 for R knee pain. He underwent R TKR on 6-28. By 6-29 he developed nausea and then temp up to 103.4 and confusion. His WBC and UA wer normal, his CXR was (-). He was started on vanco/zosyn. He was also started on ASA for post-op DVT prevention.  Doppler (-) 7-1. CTA (-), CT head (-) for acute.   UCx 6-29 (-) BCx 6-29 (-), 6-30 (-)  Past Medical History  Diagnosis Date  . Bilateral swelling of feet   . Headaches, cluster   . Anxiety   . Fatigue   . Rheumatoid arthritis(714.0)   . Muscle pain   . Hiatal hernia   . Vitamin D deficiency     takes Vit D daily  . BPH (benign prostatic hyperplasia)     no meds needed per medical md  . Osteoarthritis   . Hyperthyroidism   . Deafness     in left ear   . Hyperlipemia     was on meds but hasnt been on anything in about 68month per MD  . Presbyesophagus   . Insomnia     takes Trazodone nightly  . Depression     takes Zoloft daily  . Dysphagia   . Hypertension     takes Amlodipine,HCTZ,and Lisinopril daily  . Pneumonia 3+ yrs ago  . Peripheral neuropathy     takes Gabapentin daily  . Joint pain   . Joint swelling   . GERD (gastroesophageal reflux disease)     takes Omeprazole daily  . Chronic diarrhea   . Nocturia   . Diabetes mellitus     takes Actos and Amaryl daily as well as Onglyza  .  Cataracts, bilateral     immature    Past Surgical History  Procedure Laterality Date  . Back surgery  1998  . Colonoscopy with esophagogastroduodenoscopy (egd) and esophageal dilation (ed)    . Total knee arthroplasty Right 01/06/2015    Procedure: TOTAL KNEE ARTHROPLASTY;  Surgeon: PMelrose Nakayama MD;  [facility]  Service: Orthopedics;  Laterality: Right;     Allergies  Allergen Reactions  . Aspirin Diarrhea    Medications:  Scheduled: . amLODipine  5 mg Oral Daily  . aspirin EC  325 mg Oral BID PC  . docusate sodium  100 mg Oral BID  . donepezil  5 mg Oral QHS  . fluticasone  1 spray Each Nare Daily  . gabapentin  100 mg Oral TID  . insulin aspart  0-9 Units Subcutaneous TID WC  . lisinopril  2.5 mg Oral BID  . pantoprazole  40 mg Oral Daily  . piperacillin-tazobactam (ZOSYN)  IV  3.375 g Intravenous 3 times per day  . sertraline  25 mg Oral Daily  . traZODone  50 mg Oral QHS  . vancomycin  1,000 mg Intravenous Q8H    Abtx:  Anti-infectives    Start     Dose/Rate Route Frequency Ordered Stop   01/10/15 1400  vancomycin (VANCOCIN) IVPB 1000 mg/200 mL premix     1,000 mg 200 mL/hr over 60 Minutes Intravenous Every 8 hours 01/10/15 1358     01/08/15 1200  vancomycin (VANCOCIN) IVPB 1000 mg/200 mL premix  Status:  Discontinued     1,000 mg 200 mL/hr over 60 Minutes Intravenous Every 12 hours 01/07/15 2326 01/10/15 1358   01/08/15 0700  piperacillin-tazobactam (ZOSYN) IVPB 3.375 g     3.375 g 12.5 mL/hr over 240 Minutes Intravenous 3 times per day 01/07/15 2326     01/07/15 2330  vancomycin (VANCOCIN) 2,000 mg in sodium chloride 0.9 % 500 mL IVPB     2,000 mg 250 mL/hr over 120 Minutes Intravenous STAT 01/07/15 2326 01/08/15 0306   01/07/15 2300  piperacillin-tazobactam (ZOSYN) IVPB 3.375 g     3.375 g 100 mL/hr over 30 Minutes Intravenous  Once 01/07/15 2244 01/08/15 0016   01/07/15 2245  vancomycin (VANCOCIN) IVPB 1000 mg/200 mL premix  Status:  Discontinued      1,000 mg 200 mL/hr over 60 Minutes Intravenous  Once 01/07/15 2244 01/07/15 2254   01/06/15 1630  ceFAZolin (ANCEF) IVPB 2 g/50 mL premix     2 g 100 mL/hr over 30 Minutes Intravenous Every 6 hours 01/06/15 1606 01/06/15 2250   01/06/15 0830  ceFAZolin (ANCEF) IVPB 2 g/50 mL premix     2 g 100 mL/hr over 30 Minutes Intravenous To [facility] 01/05/15 1347 01/06/15 0955      Total days of antibiotics: 4 vanco/zosyn          Social History:  reports that he has been smoking Cigarettes.  He has a 35 pack-year smoking history. He has never used smokeless tobacco. He reports that he does not drink alcohol or use illicit drugs.  Family History  Problem Relation Age of Onset  . Heart disease Mother   . Diabetes Mother   . Heart attack Father   . Colon cancer Neg Hx   . Diabetes Brother     General ROS: eatign well, had BM lst 24h, normal urine, continue knee pain, occas numbness in feet, rare cough, see HPI.   Blood pressure 115/54, pulse 79, temperature 99.1 F (37.3 C), temperature source Oral, resp. rate 18, weight 127.461 kg (281 lb), SpO2 99 %. General appearance: alert, cooperative and no distress Eyes: negative findings: pupils equal, round, reactive to light and accomodation and B arcus senilus Throat: normal findings: oropharynx pink & moist without lesions or evidence of thrush Neck: no adenopathy and supple, symmetrical, trachea midline Lungs: clear to auscultation bilaterally Heart: regular rate and rhythm Abdomen: normal findings: bowel sounds normal and soft, non-tender and abnormal findings:  mild distension Extremities: no diabetic foot lesions. LUE edematous, bruised, ? IV infiltrated. R knee is swollen, hot.    Results for orders placed or performed during the hospital encounter of 01/06/15 (from the past 48 hour(s))  Glucose, capillary     Status: Abnormal   Collection Time: 01/08/15  5:10 PM  Result Value Ref Range   Glucose-Capillary 62 (L) 65 - 99  mg/dL  Glucose, capillary     Status: Abnormal   Collection Time: 01/08/15  9:38 PM  Result Value Ref Range   Glucose-Capillary 49 (L) 65 - 99 mg/dL  Glucose, capillary     Status: None   Collection Time: 01/08/15 10:18 PM  Result Value Ref  Range   Glucose-Capillary 90 65 - 99 mg/dL  Glucose, capillary     Status: Abnormal   Collection Time: 01/08/15 11:58 PM  Result Value Ref Range   Glucose-Capillary 111 (H) 65 - 99 mg/dL  CBC     Status: Abnormal   Collection Time: 01/09/15  5:19 AM  Result Value Ref Range   WBC 6.9 4.0 - 10.5 K/uL   RBC 3.41 (L) 4.22 - 5.81 MIL/uL   Hemoglobin 9.3 (L) 13.0 - 17.0 g/dL   HCT 28.8 (L) 39.0 - 52.0 %   MCV 84.5 78.0 - 100.0 fL   MCH 27.3 26.0 - 34.0 pg   MCHC 32.3 30.0 - 36.0 g/dL   RDW 17.2 (H) 11.5 - 15.5 %   Platelets 128 (L) 150 - 400 K/uL  Procalcitonin     Status: None   Collection Time: 01/09/15  5:19 AM  Result Value Ref Range   Procalcitonin 0.43 ng/mL    Comment:        Interpretation: PCT (Procalcitonin) <= 0.5 ng/mL: Systemic infection (sepsis) is not likely. Local bacterial infection is possible. (NOTE)         ICU PCT Algorithm               Non ICU PCT Algorithm    ----------------------------     ------------------------------         PCT < 0.25 ng/mL                 PCT < 0.1 ng/mL     Stopping of antibiotics            Stopping of antibiotics       strongly encouraged.               strongly encouraged.    ----------------------------     ------------------------------       PCT level decrease by               PCT < 0.25 ng/mL       >= 80% from peak PCT       OR PCT 0.25 - 0.5 ng/mL          Stopping of antibiotics                                             encouraged.     Stopping of antibiotics           encouraged.    ----------------------------     ------------------------------       PCT level decrease by              PCT >= 0.25 ng/mL       < 80% from peak PCT        AND PCT >= 0.5 ng/mL             Continuin g antibiotics                                              encouraged.       Continuing antibiotics            encouraged.    ----------------------------     ------------------------------     PCT level increase compared  PCT > 0.5 ng/mL         with peak PCT AND          PCT >= 0.5 ng/mL             Escalation of antibiotics                                          strongly encouraged.      Escalation of antibiotics        strongly encouraged.   Basic metabolic panel     Status: Abnormal   Collection Time: 01/09/15  5:19 AM  Result Value Ref Range   Sodium 136 135 - 145 mmol/L   Potassium 3.9 3.5 - 5.1 mmol/L   Chloride 103 101 - 111 mmol/L   CO2 26 22 - 32 mmol/L   Glucose, Bld 69 65 - 99 mg/dL   BUN 13 6 - 20 mg/dL   Creatinine, Ser 0.96 0.61 - 1.24 mg/dL   Calcium 7.8 (L) 8.9 - 10.3 mg/dL   GFR calc non Af Amer >60 >60 mL/min   GFR calc Af Amer >60 >60 mL/min    Comment: (NOTE) The eGFR has been calculated using the CKD EPI equation. This calculation has not been validated in all clinical situations. eGFR's persistently <60 mL/min signify possible Chronic Kidney Disease.    Anion gap 7 5 - 15  Glucose, capillary     Status: None   Collection Time: 01/09/15  6:34 AM  Result Value Ref Range   Glucose-Capillary 66 65 - 99 mg/dL  Glucose, capillary     Status: Abnormal   Collection Time: 01/09/15  7:14 AM  Result Value Ref Range   Glucose-Capillary 113 (H) 65 - 99 mg/dL  Vitamin B12     Status: None   Collection Time: 01/09/15  8:28 AM  Result Value Ref Range   Vitamin B-12 282 180 - 914 pg/mL    Comment: (NOTE) This assay is not validated for testing neonatal or myeloproliferative syndrome specimens for Vitamin B12 levels.   Folate     Status: None   Collection Time: 01/09/15  8:28 AM  Result Value Ref Range   Folate 15.5 >5.9 ng/mL  Iron and TIBC     Status: Abnormal   Collection Time: 01/09/15  8:28 AM  Result Value Ref Range   Iron 10 (L)  45 - 182 ug/dL   TIBC 248 (L) 250 - 450 ug/dL   Saturation Ratios 4 (L) 17.9 - 39.5 %   UIBC 238 ug/dL  Ferritin     Status: None   Collection Time: 01/09/15  8:28 AM  Result Value Ref Range   Ferritin 94 24 - 336 ng/mL  Glucose, capillary     Status: Abnormal   Collection Time: 01/09/15 11:36 AM  Result Value Ref Range   Glucose-Capillary 128 (H) 65 - 99 mg/dL  Glucose, capillary     Status: Abnormal   Collection Time: 01/09/15 11:52 AM  Result Value Ref Range   Glucose-Capillary 146 (H) 65 - 99 mg/dL  Glucose, capillary     Status: Abnormal   Collection Time: 01/09/15  4:53 PM  Result Value Ref Range   Glucose-Capillary 131 (H) 65 - 99 mg/dL  Glucose, capillary     Status: Abnormal   Collection Time: 01/09/15  9:18 PM  Result Value Ref Range   Glucose-Capillary  139 (H) 65 - 99 mg/dL  CBC with Differential/Platelet     Status: Abnormal   Collection Time: 01/10/15  4:33 AM  Result Value Ref Range   WBC 6.3 4.0 - 10.5 K/uL   RBC 3.29 (L) 4.22 - 5.81 MIL/uL   Hemoglobin 8.9 (L) 13.0 - 17.0 g/dL   HCT 27.5 (L) 39.0 - 52.0 %   MCV 83.6 78.0 - 100.0 fL   MCH 27.1 26.0 - 34.0 pg   MCHC 32.4 30.0 - 36.0 g/dL   RDW 17.0 (H) 11.5 - 15.5 %   Platelets 151 150 - 400 K/uL   Neutrophils Relative % 68 43 - 77 %   Neutro Abs 4.4 1.7 - 7.7 K/uL   Lymphocytes Relative 16 12 - 46 %   Lymphs Abs 1.0 0.7 - 4.0 K/uL   Monocytes Relative 13 (H) 3 - 12 %   Monocytes Absolute 0.8 0.1 - 1.0 K/uL   Eosinophils Relative 3 0 - 5 %   Eosinophils Absolute 0.2 0.0 - 0.7 K/uL   Basophils Relative 0 0 - 1 %   Basophils Absolute 0.0 0.0 - 0.1 K/uL  Basic metabolic panel     Status: Abnormal   Collection Time: 01/10/15  4:33 AM  Result Value Ref Range   Sodium 134 (L) 135 - 145 mmol/L   Potassium 3.6 3.5 - 5.1 mmol/L   Chloride 101 101 - 111 mmol/L   CO2 27 22 - 32 mmol/L   Glucose, Bld 136 (H) 65 - 99 mg/dL   BUN 14 6 - 20 mg/dL   Creatinine, Ser 0.96 0.61 - 1.24 mg/dL   Calcium 7.9 (L) 8.9 -  10.3 mg/dL   GFR calc non Af Amer >60 >60 mL/min   GFR calc Af Amer >60 >60 mL/min    Comment: (NOTE) The eGFR has been calculated using the CKD EPI equation. This calculation has not been validated in all clinical situations. eGFR's persistently <60 mL/min signify possible Chronic Kidney Disease.    Anion gap 6 5 - 15  Glucose, capillary     Status: Abnormal   Collection Time: 01/10/15  6:36 AM  Result Value Ref Range   Glucose-Capillary 141 (H) 65 - 99 mg/dL  Glucose, capillary     Status: Abnormal   Collection Time: 01/10/15 12:03 PM  Result Value Ref Range   Glucose-Capillary 117 (H) 65 - 99 mg/dL  Vancomycin, trough     Status: None   Collection Time: 01/10/15 12:18 PM  Result Value Ref Range   Vancomycin Tr 10 10.0 - 20.0 ug/mL      Component Value Date/Time   SDES BLOOD RIGHT ARM 01/08/2015 0008   SPECREQUEST BOTTLES DRAWN AEROBIC AND ANAEROBIC 10CC EACH 01/08/2015 0008   CULT NO GROWTH 2 DAYS 01/08/2015 0008   REPTSTATUS PENDING 01/08/2015 0008   Ct Angio Chest Pe W/cm &/or Wo Cm  01/08/2015   CLINICAL DATA:  Recent right knee replacement with chest pain and shortness of Breath  EXAM: CT ANGIOGRAPHY CHEST WITH CONTRAST  TECHNIQUE: Multidetector CT imaging of the chest was performed using the standard protocol during bolus administration of intravenous contrast. Multiplanar CT image reconstructions and MIPs were obtained to evaluate the vascular anatomy.  CONTRAST:  11m OMNIPAQUE IOHEXOL 350 MG/ML SOLN  COMPARISON:  None.  FINDINGS: The lungs are well aerated bilaterally. Very minimal dependent atelectatic changes are seen. No focal infiltrate or sizable effusion is noted. The thoracic inlet is within normal limits. The thoracic aorta  and its branches are unremarkable. The pulmonary artery shows a normal branching pattern. No definitive pulmonary embolism is identified. Opacification of the artery is somewhat decreased. No evidence of right heart strain is seen. Calcified  hilar and mediastinal lymph nodes are noted consistent with prior granulomatous disease.  The visualized upper abdomen shows no acute abnormality. The osseous structures show degenerative change of thoracic spine.  Review of the MIP images confirms the above findings.  IMPRESSION: No focal infiltrate noted.  No definitive pulmonary embolism identified although opacification of the pulmonary artery is somewhat decreased. This may be related patient's body habitus.   Electronically Signed   By: Inez Catalina M.D.   On: 01/08/2015 20:38   Dg Chest Port 1 View  01/09/2015   CLINICAL DATA:  Fever.  Recent total knee replacement  EXAM: PORTABLE CHEST - 1 VIEW  COMPARISON:  Chest radiograph January 07, 2015; chest CT January 08, 2015  FINDINGS: There is no edema or consolidation. Heart is upper normal in size with pulmonary vascularity within normal limits. No adenopathy. There is degenerative change in the thoracic spine.  IMPRESSION: No edema or consolidation.   Electronically Signed   By: Lowella Grip III M.D.   On: 01/09/2015 08:08   Dg Knee Complete 4 Views Right  01/09/2015   CLINICAL DATA:  Right knee surgery on 06/28, postop fever  EXAM: RIGHT KNEE - COMPLETE 4+ VIEW  COMPARISON:  Right knee radiographs dated 07/06/2011  FINDINGS: Right knee arthroplasty.  No evidence of hardware fracture or loosening.  No fracture or dislocation is seen.  Mild soft tissue swelling.  IMPRESSION: Right knee arthroplasty without evidence of complication.   Electronically Signed   By: Julian Hy M.D.   On: 01/09/2015 16:29   Recent Results (from the past 240 hour(s))  Culture, Urine     Status: None   Collection Time: 01/07/15 10:20 PM  Result Value Ref Range Status   Specimen Description URINE, CLEAN CATCH  Final   Special Requests NONE  Final   Culture NO GROWTH 1 DAY  Final   Report Status 01/09/2015 FINAL  Final  Culture, blood (routine x 2)     Status: None (Preliminary result)   Collection Time: 01/07/15  11:44 PM  Result Value Ref Range Status   Specimen Description BLOOD LEFT HAND  Final   Special Requests BOTTLES DRAWN AEROBIC ONLY 6CC  Final   Culture NO GROWTH 2 DAYS  Final   Report Status PENDING  Incomplete  Culture, blood (routine x 2)     Status: None (Preliminary result)   Collection Time: 01/08/15 12:08 AM  Result Value Ref Range Status   Specimen Description BLOOD RIGHT ARM  Final   Special Requests BOTTLES DRAWN AEROBIC AND ANAEROBIC 10CC EACH  Final   Culture NO GROWTH 2 DAYS  Final   Report Status PENDING  Incomplete      01/10/2015, 2:09 PM     LOS: 4 days

## 2015-01-11 ENCOUNTER — Inpatient Hospital Stay (HOSPITAL_COMMUNITY): Payer: Medicare Other

## 2015-01-11 DIAGNOSIS — M7989 Other specified soft tissue disorders: Secondary | ICD-10-CM

## 2015-01-11 DIAGNOSIS — I82629 Acute embolism and thrombosis of deep veins of unspecified upper extremity: Secondary | ICD-10-CM | POA: Clinically undetermined

## 2015-01-11 DIAGNOSIS — D509 Iron deficiency anemia, unspecified: Secondary | ICD-10-CM | POA: Diagnosis present

## 2015-01-11 DIAGNOSIS — I82622 Acute embolism and thrombosis of deep veins of left upper extremity: Secondary | ICD-10-CM

## 2015-01-11 LAB — BASIC METABOLIC PANEL
Anion gap: 8 (ref 5–15)
BUN: 10 mg/dL (ref 6–20)
CALCIUM: 8.2 mg/dL — AB (ref 8.9–10.3)
CHLORIDE: 104 mmol/L (ref 101–111)
CO2: 25 mmol/L (ref 22–32)
Creatinine, Ser: 0.93 mg/dL (ref 0.61–1.24)
GFR calc Af Amer: >60 mL/min (ref >60)
GLUCOSE: 142 mg/dL — AB (ref 65–99)
Potassium: 3.7 mmol/L (ref 3.5–5.1)
Sodium: 137 mmol/L (ref 135–145)

## 2015-01-11 LAB — SYNOVIAL CELL COUNT + DIFF, W/ CRYSTALS
CRYSTALS FLUID: NONE SEEN
EOSINOPHILS-SYNOVIAL: 0 % (ref 0–1)
LYMPHOCYTES-SYNOVIAL FLD: 0 % (ref 0–20)
MONOCYTE-MACROPHAGE-SYNOVIAL FLUID: 0 % — AB (ref 50–90)
Neutrophil, Synovial: 100 % — ABNORMAL HIGH (ref 0–25)
Other Cells-SYN: 0
WBC, SYNOVIAL: 13292 /mm3 — AB (ref 0–200)

## 2015-01-11 LAB — CBC
HCT: 29.1 % — ABNORMAL LOW (ref 39.0–52.0)
Hemoglobin: 9.4 g/dL — ABNORMAL LOW (ref 13.0–17.0)
MCH: 27 pg (ref 26.0–34.0)
MCHC: 32.3 g/dL (ref 30.0–36.0)
MCV: 83.6 fL (ref 78.0–100.0)
PLATELETS: 187 10*3/uL (ref 150–400)
RBC: 3.48 MIL/uL — ABNORMAL LOW (ref 4.22–5.81)
RDW: 16.8 % — AB (ref 11.5–15.5)
WBC: 5.8 10*3/uL (ref 4.0–10.5)

## 2015-01-11 LAB — SEDIMENTATION RATE: Sed Rate: 86 mm/hr — ABNORMAL HIGH (ref 0–16)

## 2015-01-11 LAB — GLUCOSE, CAPILLARY
GLUCOSE-CAPILLARY: 148 mg/dL — AB (ref 65–99)
Glucose-Capillary: 109 mg/dL — ABNORMAL HIGH (ref 65–99)
Glucose-Capillary: 163 mg/dL — ABNORMAL HIGH (ref 65–99)

## 2015-01-11 LAB — C-REACTIVE PROTEIN: CRP: 21.5 mg/dL — ABNORMAL HIGH (ref <1.0)

## 2015-01-11 LAB — PROCALCITONIN: PROCALCITONIN: 0.26 ng/mL

## 2015-01-11 MED ORDER — ENOXAPARIN SODIUM 150 MG/ML ~~LOC~~ SOLN
130.0000 mg | Freq: Two times a day (BID) | SUBCUTANEOUS | Status: DC
  Administered 2015-01-11: 130 mg via SUBCUTANEOUS
  Filled 2015-01-11 (×2): qty 1

## 2015-01-11 MED ORDER — HEPARIN (PORCINE) IN NACL 100-0.45 UNIT/ML-% IJ SOLN
1600.0000 [IU]/h | INTRAMUSCULAR | Status: DC
  Filled 2015-01-11: qty 250

## 2015-01-11 MED ORDER — SODIUM CHLORIDE 0.9 % IJ SOLN: 10.0000 mL | Freq: Two times a day (BID) | INTRAMUSCULAR | Status: DC

## 2015-01-11 MED ORDER — SODIUM CHLORIDE 0.9 % IJ SOLN
10.0000 mL | INTRAMUSCULAR | Status: DC | PRN
  Administered 2015-01-12 – 2015-01-14 (×3): 10 mL
  Filled 2015-01-11 (×3): qty 40

## 2015-01-11 MED ORDER — FERROUS SULFATE 325 (65 FE) MG PO TABS
325.0000 mg | ORAL_TABLET | Freq: Three times a day (TID) | ORAL | Status: DC
  Administered 2015-01-11 – 2015-01-14 (×8): 325 mg via ORAL
  Filled 2015-01-11 (×7): qty 1

## 2015-01-11 MED ORDER — HEPARIN BOLUS VIA INFUSION
4000.0000 [IU] | Freq: Once | INTRAVENOUS | Status: DC
  Filled 2015-01-11: qty 4000

## 2015-01-11 NOTE — Progress Notes (Signed)
[facility] PROGRESS NOTE  Andrew Winters PJK:[id number] DOB: 04-14-1941 DOA: 01/06/2015 PCP: Crist Fat, MD  Assessment/Plan: #1 systemic inflammatory response syndrome Unknown etiology. Patient still spiking fevers, with fever curve trending down. Patient has been pancultured and cultures are pending. Lactic acid level within normal limits. Pro calcitonin within normal limits. Urinalysis leukocytes negative, nitrites negative. Chest x-ray negative for any acute infiltrate. Repeat chest x-ray is negative. CT chest negative for PE. Lower extremity Dopplers negative for DVT. Plain films of the right knee negative with postop changes. Clinical exam of the right knee with some tightness, erythema, warmth, tenderness to palpation. Patient's white count although is normal. Left upper extremity Doppler positive for DVT and SVT. Will place patient on full dose Lovenox. Patient's right knee has been aspirated per orthopedics and cultures are pending. Continue empiric IV vancomycin and IV Zosyn. ID has been consulted and following. Please see problem #2.  #2 postop fever/fever Unknown etiology. Patient with a normal white count. Patient has been pancultured and cultures are pending. Urinalysis is negative for UTI. Chest x-ray is negative for any acute infiltrate. Blood cultures are pending. Repeat chest x-ray is negative. CT chest negative for PE. Lower extremity Dopplers negative for DVT. Plain films of the right knee with no acute abnormalities however does show postop changes. Patient however continues to spike fever and as such have consulted with infectious diseases who feel may be secondary to aspirin drug reaction versus possible knee infection. Doppler ultrasound of the left upper extremity is positive for DVT and SVT. Patient's right knee on examination is warm tight with tenderness to palpation. Patient status post knee aspiration per orthopedics PA this morning. Cultures are pending. Will place  patient on full dose Lovenox. Continue empiric IV vancomycin and IV Zosyn while awaiting culture results. ID following.  #3 left upper extremity DVT Place patient on full dose Lovenox. We'll need to monitor for bleeding. Discussed with orthopedics PA..   #4 acute encephalopathy with history of dementia Likely secondary to metabolic encephalopathy. Patient with clinical improvement and close to baseline per wife. Patient has been pancultured and cultures are pending. Continue empiric IV antibiotics. Follow.  #5. hypertension HCTZ on hold. Continue current regimen. Follow.  #6 diabetes mellitus type 2 CBGs have ranged from 109-148. Discontinued oral hypoglycemic agents. Continue sliding scale insulin.  #7 right knee osteoarthritis Status post right TKA. Dr. Jerl Santos 01/06/2015  #8 prophylaxis Discontinued aspirin due to concerns for possible drug fever .Lovenox has been changed to full dose Lovenox as patient noted to have a DVT in the left upper extremity. Discussed with PA of orthopedics.  Code Status: Full Family Communication: Updated patient. No family at bedside. Disposition Plan: Probably to skilled nursing facility once medically stable and afebrile. Per primary team.   Consultants:  [facility]; Dr Toniann Fail 01/07/2015  Infectious disease: Dr. Ninetta Lights 01/10/2015  Procedures:  CT head 01/08/2015  Chest x-ray 01/07/2015  R TKA perr Dr Jerl Santos 01/06/15  CT angiogram chest 01/08/2015  Lower extremity Dopplers 01/09/2015  Left upper extremity Doppler 01/11/2015  Right knee aspiration per orthopedics Dannielle Burn, PA 01/11/2015  Antibiotics:  IV vancomycin 01/07/2015  IV Zosyn 01/07/2015  HPI/Subjective: Patient denies any shortness of breath. No chest pain.  Objective: Filed Vitals:   01/11/15 0441  BP: 111/57  Pulse: 81  Temp: 98.9 F (37.2 C)  Resp: 18    Intake/Output Summary (Last 24 hours) at 01/11/15 1430 Last data filed at 01/11/15  0937  Gross per 24 hour  Intake    490 ml  Output   1800 ml  Net  -1310 ml   Filed Weights   01/06/15 0818  Weight: 127.461 kg (281 lb)    Exam:   General:  NAD  Cardiovascular: RRR  Respiratory: CTAB anterior lung fields  Abdomen: Soft, nontender, nondistended, positive bowel sounds  Musculoskeletal: No clubbing cyanosis. Right knee is tight, positive warmth, tenderness to palpation, erythematous. Left upper extremity swelling.   Data Reviewed: Basic Metabolic Panel:  Recent Labs Lab 01/07/15 0444 01/08/15 0008 01/09/15 0519 01/10/15 0433 01/11/15 0641  NA 138 137 136 134* 137  K 4.4 4.6 3.9 3.6 3.7  CL 101 104 103 101 104  CO2 28 27 26 27 25   GLUCOSE 110* 99 69 136* 142*  BUN 13 10 13 14 10   CREATININE 0.93 0.93 0.96 0.96 0.93  CALCIUM 8.3* 8.1* 7.8* 7.9* 8.2*   Liver Function Tests:  Recent Labs Lab 01/08/15 0008  AST 27  ALT 12*  ALKPHOS 53  BILITOT 0.5  PROT 5.9*  ALBUMIN 3.0*   No results for input(s): LIPASE, AMYLASE in the last 168 hours. No results for input(s): AMMONIA in the last 168 hours. CBC:  Recent Labs Lab 01/08/15 0008 01/08/15 0412 01/09/15 0519 01/10/15 0433 01/11/15 0641  WBC 7.1 7.3 6.9 6.3 5.8  NEUTROABS 5.1  --   --  4.4  --   HGB 10.3* 10.1* 9.3* 8.9* 9.4*  HCT 32.0* 31.8* 28.8* 27.5* 29.1*  MCV 84.7 84.1 84.5 83.6 83.6  PLT 144* 147* 128* 151 187   Cardiac Enzymes: No results for input(s): CKTOTAL, CKMB, CKMBINDEX, TROPONINI in the last 168 hours. BNP (last 3 results) No results for input(s): BNP in the last 8760 hours.  ProBNP (last 3 results) No results for input(s): PROBNP in the last 8760 hours.  CBG:  Recent Labs Lab 01/10/15 1203 01/10/15 1630 01/10/15 2132 01/11/15 0620 01/11/15 1303  GLUCAP 117* 149* 147* 148* 109*    Recent Results (from the past 240 hour(s))  Culture, Urine     Status: None   Collection Time: 01/07/15 10:20 PM  Result Value Ref Range Status   Specimen Description  URINE, CLEAN CATCH  Final   Special Requests NONE  Final   Culture NO GROWTH 1 DAY  Final   Report Status 01/09/2015 FINAL  Final  Culture, blood (routine x 2)     Status: None (Preliminary result)   Collection Time: 01/07/15 11:44 PM  Result Value Ref Range Status   Specimen Description BLOOD LEFT HAND  Final   Special Requests BOTTLES DRAWN AEROBIC ONLY 6CC  Final   Culture NO GROWTH 2 DAYS  Final   Report Status PENDING  Incomplete  Culture, blood (routine x 2)     Status: None (Preliminary result)   Collection Time: 01/08/15 12:08 AM  Result Value Ref Range Status   Specimen Description BLOOD RIGHT ARM  Final   Special Requests BOTTLES DRAWN AEROBIC AND ANAEROBIC 10CC EACH  Final   Culture NO GROWTH 2 DAYS  Final   Report Status PENDING  Incomplete  Anaerobic culture     Status: None (Preliminary result)   Collection Time: 01/11/15 10:08 AM  Result Value Ref Range Status   Specimen Description FLUID RIGHT KNEE  Final   Special Requests NONE  Final   Gram Stain PENDING  Incomplete   Culture PENDING  Incomplete   Report Status PENDING  Incomplete  Body fluid culture     Status:  None (Preliminary result)   Collection Time: 01/11/15 10:08 AM  Result Value Ref Range Status   Specimen Description FLUID RIGHT KNEE  Final   Special Requests NONE  Final   Gram Stain   Final    MODERATE WBC PRESENT, PREDOMINANTLY PMN NO ORGANISMS SEEN GROSSLY BLOODY SPECIMEN    Culture PENDING  Incomplete   Report Status PENDING  Incomplete     Studies: Dg Knee Complete 4 Views Right  01/09/2015   CLINICAL DATA:  Right knee surgery on 06/28, postop fever  EXAM: RIGHT KNEE - COMPLETE 4+ VIEW  COMPARISON:  Right knee radiographs dated 07/06/2011  FINDINGS: Right knee arthroplasty.  No evidence of hardware fracture or loosening.  No fracture or dislocation is seen.  Mild soft tissue swelling.  IMPRESSION: Right knee arthroplasty without evidence of complication.   Electronically Signed   By: Charline Bills M.D.   On: 01/09/2015 16:29    Scheduled Meds: . amLODipine  5 mg Oral Daily  . docusate sodium  100 mg Oral BID  . donepezil  5 mg Oral QHS  . ferrous sulfate  325 mg Oral TID WC  . fluticasone  1 spray Each Nare Daily  . gabapentin  100 mg Oral TID  . heparin  4,000 Units Intravenous Once  . insulin aspart  0-9 Units Subcutaneous TID WC  . lisinopril  2.5 mg Oral BID  . pantoprazole  40 mg Oral Daily  . piperacillin-tazobactam (ZOSYN)  IV  3.375 g Intravenous 3 times per day  . sertraline  25 mg Oral Daily  . traZODone  50 mg Oral QHS  . vancomycin  1,000 mg Intravenous Q8H   Continuous Infusions: . heparin      Principal Problem:   Primary osteoarthritis of right knee Active Problems:   SIRS (systemic inflammatory response syndrome)   Fever   GERD   Diabetes   Morbid obesity   Diabetes mellitus type 2, controlled   Essential hypertension   Acute encephalopathy   Postoperative fever   DVT of upper extremity (deep vein thrombosis): LEFT   Anemia, iron deficiency    Time spent: 40 minutes    [facility] MD [facility] Pager [phone]. If 7PM-7AM, please contact night-coverage at [email or web address], password [facility] 01/11/2015, 2:30 PM  LOS: 5 days

## 2015-01-11 NOTE — Progress Notes (Signed)
Subjective: 5 Days Post-Op Procedure(s) (LRB): TOTAL KNEE ARTHROPLASTY (Right) Patient reports pain as slightly better than a couple days ago but still very painful.tb.    Objective: Vital signs in last 24 hours: Temp:  [98.9 F (37.2 C)-99.8 F (37.7 C)] 99.8 F (37.7 C) (07/03 1635) Pulse Rate:  [81-88] 88 (07/03 1635) Resp:  [18] 18 (07/03 1635) BP: (111-151)/(57-60) 151/60 mmHg (07/03 1635) SpO2:  [97 %-98 %] 98 % (07/03 1635)  Intake/Output from previous day: 07/02 0701 - 07/03 0700 In: 730 [P.O.:480; IV Piggyback:250] Out: 2150 [Urine:2150] Intake/Output this shift: Total I/O In: 240 [P.O.:240] Out: 350 [Urine:350]   Recent Labs  01/09/15 0519 01/10/15 0433 01/11/15 0641  HGB 9.3* 8.9* 9.4*    Recent Labs  01/10/15 0433 01/11/15 0641  WBC 6.3 5.8  RBC 3.29* 3.48*  HCT 27.5* 29.1*  PLT 151 187    Recent Labs  01/10/15 0433 01/11/15 0641  NA 134* 137  K 3.6 3.7  CL 101 104  CO2 27 25  BUN 14 10  CREATININE 0.96 0.93  GLUCOSE 136* 142*  CALCIUM 7.9* 8.2*   No results for input(s): LABPT, INR in the last 72 hours.  Neurologically intact ABD soft Neurovascular intact Sensation intact distally Intact pulses distally No cellulitis present Compartment soft Mild pain on ROM with limited rom of 40 deg Assessment/Plan: 5 Days Post-Op Procedure(s) (LRB): TOTAL KNEE ARTHROPLASTY (Right) Concerning numbers for possible joint infection with WBC 13K and 100% neutrophils and CRP 21.7 that being said acute infection without large effusion and big erythema would seem unlikely but patient has been on IV ABx .  I wuill make the patient NPO and re-evaluate in the morning --will hold the AM Lovenox // will reaspirate knee in the AM to compare cell counts to todays sample.  I would note that acute infection at 5 days has been unbelievably rare in our practice which does not mean that he does not have one but just means that it is incredibly rare.  Andrew Winters  L 01/11/2015, 6:09 PM

## 2015-01-11 NOTE — Progress Notes (Signed)
Physical Therapy Treatment Patient Details Name: Andrew Winters MRN: [id number] DOB: [birth year] Today's Date: 01/11/2015    History of Present Illness R TKA.    PT Comments    Pt con't to have painful, swollen and errythemic R knee which is warm to touch. Pt con't to require significant assist to transfer to standing. Pt ambulation tolerance limited by pain in R knee. PT to con't to work with pt to progress ambulation as able.  Follow Up Recommendations  SNF     Equipment Recommendations  Rolling walker with 5" wheels    Recommendations for Other Services       Precautions / Restrictions Precautions Precautions: Fall;Knee Precaution Booklet Issued: No Required Braces or Orthoses: Knee Immobilizer - Right Knee Immobilizer - Right:  (re-adjusted for optimal support) Restrictions Weight Bearing Restrictions: Yes RLE Weight Bearing: Weight bearing as tolerated    Mobility  Bed Mobility Overal bed mobility: Needs Assistance Bed Mobility: Sit to Supine       Sit to supine: Mod assist;+2 for physical assistance   General bed mobility comments: assist for LE managment back into bed and direction of trunk  Transfers Overall transfer level: Needs assistance Equipment used: Rolling walker (2 wheeled) Transfers: Sit to/from Stand Sit to Stand: Max assist;+2 physical assistance         General transfer comment: maxA for initial anterior weight shift due to low surface height and pt inability to bend L knee back to support body weight upon standing  Ambulation/Gait Ambulation/Gait assistance: Min guard Ambulation Distance (Feet): 75 Feet Assistive device: Rolling walker (2 wheeled) Gait Pattern/deviations: Decreased step length - right;Decreased stance time - right;Decreased stride length;Decreased weight shift to right   Gait velocity interpretation: Below normal speed for age/gender General Gait Details: slow, max directional v/c's for sequencing/proper use of  RW   Stairs            Wheelchair Mobility    Modified Rankin (Stroke Patients Only)       Balance                                    Cognition Arousal/Alertness: Awake/alert Behavior During Therapy: WFL for tasks assessed/performed Overall Cognitive Status: Within Functional Limits for tasks assessed                      Exercises Total Joint Exercises Quad Sets: Right;10 reps;Supine Knee Flexion: AAROM;Right;10 reps    General Comments        Pertinent Vitals/Pain Pain Assessment: 0-10 Pain Score: 6  Pain Location: right knee Pain Descriptors / Indicators: Sore Pain Intervention(s): Monitored during session    Home Living                      Prior Function            PT Goals (current goals can now be found in the care plan section) Progress towards PT goals: Progressing toward goals    Frequency  7X/week    PT Plan Current plan remains appropriate    Co-evaluation             End of Session Equipment Utilized During Treatment: Gait belt;Right knee immobilizer Activity Tolerance: Patient tolerated treatment well Patient left: in chair;with call bell/phone within reach;with family/visitor present     Time: 7169-6789 PT Time Calculation (min) (ACUTE ONLY): 23 min  Charges:  $  Gait Training: 8-22 mins $Therapeutic Exercise: 8-22 mins                    G Codes:      Marcene Brawn 01/11/2015, 9:54 AM   Lewis Shock, PT, DPT Pager #: [phone] Office #: [phone]

## 2015-01-11 NOTE — Progress Notes (Signed)
Patient sustained an assisted fall. Nurse tech told patient to hold on to the walker but instead he pushed the walker away and reached for the bed. The nurse tech assisted him to the floor. No injuries, vitals stable; MD notified, family notified. Will continue to monitor

## 2015-01-11 NOTE — Progress Notes (Signed)
INFECTIOUS DISEASE PROGRESS NOTE  ID: Andrew Winters is a 74 y.o. male with  Principal Problem:   Primary osteoarthritis of right knee Active Problems:   GERD   Diabetes   Morbid obesity   Diabetes mellitus type 2, controlled   Essential hypertension   SIRS (systemic inflammatory response syndrome)   Acute encephalopathy   Fever   Postoperative fever   DVT of upper extremity (deep vein thrombosis): LEFT   Anemia, iron deficiency  Subjective: Without complaints, eating.  Abtx:  Anti-infectives    Start     Dose/Rate Route Frequency Ordered Stop   01/10/15 1400  vancomycin (VANCOCIN) IVPB 1000 mg/200 mL premix     1,000 mg 200 mL/hr over 60 Minutes Intravenous Every 8 hours 01/10/15 1358     01/08/15 1200  vancomycin (VANCOCIN) IVPB 1000 mg/200 mL premix  Status:  Discontinued     1,000 mg 200 mL/hr over 60 Minutes Intravenous Every 12 hours 01/07/15 2326 01/10/15 1358   01/08/15 0700  piperacillin-tazobactam (ZOSYN) IVPB 3.375 g     3.375 g 12.5 mL/hr over 240 Minutes Intravenous 3 times per day 01/07/15 2326     01/07/15 2330  vancomycin (VANCOCIN) 2,000 mg in sodium chloride 0.9 % 500 mL IVPB     2,000 mg 250 mL/hr over 120 Minutes Intravenous STAT 01/07/15 2326 01/08/15 0306   01/07/15 2300  piperacillin-tazobactam (ZOSYN) IVPB 3.375 g     3.375 g 100 mL/hr over 30 Minutes Intravenous  Once 01/07/15 2244 01/08/15 0016   01/07/15 2245  vancomycin (VANCOCIN) IVPB 1000 mg/200 mL premix  Status:  Discontinued     1,000 mg 200 mL/hr over 60 Minutes Intravenous  Once 01/07/15 2244 01/07/15 2254   01/06/15 1630  ceFAZolin (ANCEF) IVPB 2 g/50 mL premix     2 g 100 mL/hr over 30 Minutes Intravenous Every 6 hours 01/06/15 1606 01/06/15 2250   01/06/15 0830  ceFAZolin (ANCEF) IVPB 2 g/50 mL premix     2 g 100 mL/hr over 30 Minutes Intravenous To [facility] 01/05/15 1347 01/06/15 0955      Medications:  Scheduled: . amLODipine  5 mg Oral Daily  . docusate  sodium  100 mg Oral BID  . donepezil  5 mg Oral QHS  . ferrous sulfate  325 mg Oral TID WC  . fluticasone  1 spray Each Nare Daily  . gabapentin  100 mg Oral TID  . heparin  4,000 Units Intravenous Once  . insulin aspart  0-9 Units Subcutaneous TID WC  . lisinopril  2.5 mg Oral BID  . pantoprazole  40 mg Oral Daily  . piperacillin-tazobactam (ZOSYN)  IV  3.375 g Intravenous 3 times per day  . sertraline  25 mg Oral Daily  . traZODone  50 mg Oral QHS  . vancomycin  1,000 mg Intravenous Q8H    Objective: Vital signs in last 24 hours: Temp:  [98.9 F (37.2 C)-99.8 F (37.7 C)] 98.9 F (37.2 C) (07/03 0441) Pulse Rate:  [81] 81 (07/03 0441) Resp:  [18] 18 (07/03 0441) BP: (111-133)/(49-57) 111/57 mmHg (07/03 0441) SpO2:  [97 %-99 %] 98 % (07/03 0441)   General appearance: alert, cooperative and no distress Resp: clear to auscultation bilaterally Cardio: regular rate and rhythm GI: normal findings: bowel sounds normal and soft, non-tender and abnormal findings:  distended Extremities: LUE swelling unchanged. R knee unchanged swelling, mild decrease in heat.   Lab Results  Recent Labs  01/10/15 [phone] 01/11/15 [phone]  WBC 6.3 5.8  HGB 8.9* 9.4*  HCT 27.5* 29.1*  NA 134* 137  K 3.6 3.7  CL 101 104  CO2 27 25  BUN 14 10  CREATININE 0.96 0.93   Liver Panel No results for input(s): PROT, ALBUMIN, AST, ALT, ALKPHOS, BILITOT, BILIDIR, IBILI in the last 72 hours. Sedimentation Rate  Recent Labs  01/11/15 1055  ESRSEDRATE 86*   C-Reactive Protein  Recent Labs  01/11/15 1055  CRP 21.5*    Microbiology: Recent Results (from the past 240 hour(s))  Culture, Urine     Status: None   Collection Time: 01/07/15 10:20 PM  Result Value Ref Range Status   Specimen Description URINE, CLEAN CATCH  Final   Special Requests NONE  Final   Culture NO GROWTH 1 DAY  Final   Report Status 01/09/2015 FINAL  Final  Culture, blood (routine x 2)     Status: None (Preliminary result)     Collection Time: 01/07/15 11:44 PM  Result Value Ref Range Status   Specimen Description BLOOD LEFT HAND  Final   Special Requests BOTTLES DRAWN AEROBIC ONLY 6CC  Final   Culture NO GROWTH 2 DAYS  Final   Report Status PENDING  Incomplete  Culture, blood (routine x 2)     Status: None (Preliminary result)   Collection Time: 01/08/15 12:08 AM  Result Value Ref Range Status   Specimen Description BLOOD RIGHT ARM  Final   Special Requests BOTTLES DRAWN AEROBIC AND ANAEROBIC 10CC EACH  Final   Culture NO GROWTH 2 DAYS  Final   Report Status PENDING  Incomplete  Anaerobic culture     Status: None (Preliminary result)   Collection Time: 01/11/15 10:08 AM  Result Value Ref Range Status   Specimen Description FLUID RIGHT KNEE  Final   Special Requests NONE  Final   Gram Stain PENDING  Incomplete   Culture PENDING  Incomplete   Report Status PENDING  Incomplete  Body fluid culture     Status: None (Preliminary result)   Collection Time: 01/11/15 10:08 AM  Result Value Ref Range Status   Specimen Description FLUID RIGHT KNEE  Final   Special Requests NONE  Final   Gram Stain   Final    MODERATE WBC PRESENT, PREDOMINANTLY PMN NO ORGANISMS SEEN GROSSLY BLOODY SPECIMEN    Culture PENDING  Incomplete   Report Status PENDING  Incomplete    Studies/Results: Dg Knee Complete 4 Views Right  01/09/2015   CLINICAL DATA:  Right knee surgery on 06/28, postop fever  EXAM: RIGHT KNEE - COMPLETE 4+ VIEW  COMPARISON:  Right knee radiographs dated 07/06/2011  FINDINGS: Right knee arthroplasty.  No evidence of hardware fracture or loosening.  No fracture or dislocation is seen.  Mild soft tissue swelling.  IMPRESSION: Right knee arthroplasty without evidence of complication.   Electronically Signed   By: Charline Bills M.D.   On: 01/09/2015 16:29     Assessment/Plan: Post-op fever R TKR LUE edema- DVT DM2  Total days of antibiotics: 5 vanco/zosyn  Await Cx from aspirate of his knee (13,292  WBC 100% N) Continue anbx while awaiting this.  Discuss with ortho regarding anti-coagulant for DVT in light of recent surgery.         Johny Sax Infectious Diseases (pager) [phone] [email or web address] 01/11/2015, 2:30 PM  LOS: 5 days

## 2015-01-11 NOTE — Progress Notes (Signed)
PATIENT ID: Andrew Winters  MRN: [id number]  DOB/AGE:  10/23/1940 / 73 y.o.  5 Days Post-Op Procedure(s) (LRB): TOTAL KNEE ARTHROPLASTY (Right)    PROGRESS NOTE Subjective: Patient is alert, oriented, no Nausea, no Vomiting, yes passing gas, no Bowel Movement. Taking PO well. Denies SOB, Chest or Calf Pain. Using Incentive Spirometer, PAS in place. Ambulate WBAT, No CPM at this time Patient reports pain as moderate and severe  .    Objective: Vital signs in last 24 hours: Filed Vitals:   01/10/15 0600 01/10/15 1453 01/10/15 1952 01/11/15 0441  BP: 115/54 112/49 133/57 111/57  Pulse: 79 81 81 81  Temp: 99.1 F (37.3 C) 99.3 F (37.4 C) 99.8 F (37.7 C) 98.9 F (37.2 C)  TempSrc: Oral Oral Oral Oral  Resp: 18 18 18 18   Weight:      SpO2: 99% 99% 97% 98%      Intake/Output from previous day: I/O last 3 completed shifts: In: 730 [P.O.:480; IV Piggyback:250] Out: 3350 [Urine:3350]   Intake/Output this shift: Total I/O In: 240 [P.O.:240] Out: 350 [Urine:350]   LABORATORY DATA:  Recent Labs  01/10/15 0433  01/10/15 1630 01/10/15 2132 01/11/15 0620 01/11/15 0641  WBC 6.3  --   --   --   --  5.8  HGB 8.9*  --   --   --   --  9.4*  HCT 27.5*  --   --   --   --  29.1*  PLT 151  --   --   --   --  187  NA 134*  --   --   --   --  137  K 3.6  --   --   --   --  3.7  CL 101  --   --   --   --  104  CO2 27  --   --   --   --  25  BUN 14  --   --   --   --  10  CREATININE 0.96  --   --   --   --  0.93  GLUCOSE 136*  --   --   --   --  142*  GLUCAP  --   < > 149* 147* 148*  --   CALCIUM 7.9*  --   --   --   --  8.2*  < > = values in this interval not displayed.  Examination: Neurologically intact Neurovascular intact Sensation intact distally Intact pulses distally Dorsiflexion/Plantar flexion intact Incision: dressing C/D/I and no drainage Compartment soft}   Assessment:   5 Days Post-Op Procedure(s) (LRB): TOTAL KNEE ARTHROPLASTY (Right) ADDITIONAL DIAGNOSIS:  Expected Acute Blood Loss Anemia,  Pt followed by medicine for systemic inflammatory response syndrome and post op fever with negative work up.  Plan: PT/OT WBAT, hold off on CPM for now. DVT Prophylaxis:  SCDx72hrs, Lovenox DISCHARGE PLAN: Skilled Nursing Facility/Rehab DISCHARGE NEEDS: HHPT, HHRN, Walker, 3-in-1 comode seat and IV Antibiotics   Pt's right knee does feel warm and erythematous today.  Obvious pain with ROM.  Utilizing sterile technique, the pt's knee was aspirated obtaining 15 cc's of bloody serosanguinous fluid.  The aspirate is sent for Cell count, gram stain, and culture.  We are also getting sed rate and CRP.  We d/ced the pt's ASA yesterday and started lovenox this am.  His fever has been in check over night, but he has continued to be on IV VANC and Zosyn.  Ky Rumple R 01/11/2015, 10:28 AM

## 2015-01-11 NOTE — Progress Notes (Signed)
Spoke with Pt. Regarding PICC line insertion.  Patient states does not want to do PICC or sign consent without wife present.  Risk of delay of care (heparin) explained.  Will return when wife is present.

## 2015-01-11 NOTE — Progress Notes (Signed)
   01/11/15 1600  What Happened  Was fall witnessed? Yes  Who witnessed fall? (nurse tech)  Patients activity before fall bathroom-assisted;other (comment) (bedside commode)  Point of contact buttocks  Was patient injured? No  Follow Up  MD notified (yes)  Time MD notified 1630  Family notified Yes-comment  Time family notified 1615  Additional tests No

## 2015-01-11 NOTE — Progress Notes (Signed)
VASCULAR LAB PRELIMINARY  PRELIMINARY  PRELIMINARY  PRELIMINARY  Left upper extremity venous Doppler completed.    Preliminary report:  There is non occlusive, acute DVT noted in the brachial vein from just above the [facility] to mid upper arm.  There is SVT noted in the left cephalic vein from below AC to mid upper arm.   Gioia Ranes, RVT 01/11/2015, 12:08 PM

## 2015-01-11 NOTE — Progress Notes (Signed)
Peripherally Inserted Central Catheter/Midline Placement  The IV Nurse has discussed with the patient and/or persons authorized to consent for the patient, the purpose of this procedure and the potential benefits and risks involved with this procedure.  The benefits include less needle sticks, lab draws from the catheter and patient may be discharged home with the catheter.  Risks include, but not limited to, infection, bleeding, blood clot (thrombus formation), and puncture of an artery; nerve damage and irregular heat beat.  Alternatives to this procedure were also discussed.  PICC/Midline Placement Documentation  PICC / Midline Double Lumen 01/11/15 PICC Right Basilic 40 cm 0 cm (Active)  Indication for Insertion or Continuance of Line Poor Vasculature-patient has had multiple peripheral attempts or PIVs lasting less than 24 hours 01/11/2015  4:40 PM  Exposed Catheter (cm) 0 cm 01/11/2015  4:40 PM  Site Assessment Clean;Dry;Intact 01/11/2015  4:40 PM  Lumen #1 Status Flushed;Saline locked;Blood return noted 01/11/2015  4:40 PM  Lumen #2 Status Flushed;Saline locked;Blood return noted 01/11/2015  4:40 PM  Dressing Change Due 01/11/15 01/11/2015  4:40 PM       Ethelda Chick 01/11/2015, 4:41 PM

## 2015-01-11 NOTE — Social Work (Signed)
CSW contacted patient's wife in order to share bed offers. Patient's wife chose bed at [facility].   Beverly Sessions MSW, LCSW [phone]

## 2015-01-11 NOTE — Progress Notes (Addendum)
ANTICOAGULATION CONSULT NOTE - Initial Consult  Pharmacy Consult for Heparin  Indication: DVT  Allergies  Allergen Reactions  . Aspirin Diarrhea    Patient Measurements: Weight: 281 lb (127.461 kg) Heparin Dosing Weight: 108 kg  Vital Signs: Temp: 98.9 F (37.2 C) (07/03 0441) Temp Source: Oral (07/03 0441) BP: 111/57 mmHg (07/03 0441) Pulse Rate: 81 (07/03 0441)  Labs:  Recent Labs  01/09/15 0519 01/10/15 0433 01/11/15 0641  HGB 9.3* 8.9* 9.4*  HCT 28.8* 27.5* 29.1*  PLT 128* 151 187  CREATININE 0.96 0.96 0.93    Estimated Creatinine Clearance: 99 mL/min (by C-G formula based on Cr of 0.93).   Medical History: Past Medical History  Diagnosis Date  . Bilateral swelling of feet   . Headaches, cluster   . Anxiety   . Fatigue   . Rheumatoid arthritis(714.0)   . Muscle pain   . Hiatal hernia   . Vitamin D deficiency     takes Vit D daily  . BPH (benign prostatic hyperplasia)     no meds needed per medical md  . Osteoarthritis   . Hyperthyroidism   . Deafness     in left ear   . Hyperlipemia     was on meds but hasnt been on anything in about 20months per MD  . Presbyesophagus   . Insomnia     takes Trazodone nightly  . Depression     takes Zoloft daily  . Dysphagia   . Hypertension     takes Amlodipine,HCTZ,and Lisinopril daily  . Pneumonia 3+ yrs ago  . Peripheral neuropathy     takes Gabapentin daily  . Joint pain   . Joint swelling   . GERD (gastroesophageal reflux disease)     takes Omeprazole daily  . Chronic diarrhea   . Nocturia   . Diabetes mellitus     takes Actos and Amaryl daily as well as Onglyza  . Cataracts, bilateral     immature   Assessment: 74 y.o. male s/p R TKR 6/28, and developed post-op fever on vancomycin/zosyn. Pt. With LUE edema, and found to have acute LUE DVT. Pharmacy is consulted to start IV heparin. Noted, pt was started on lovenox 30 mg sq q 12 hrs last night and received a dose this morning at ~ 0900. hgb 9.4,  low stable, plt 187 K    Goal of Therapy:  Heparin level 0.3-0.7 units/ml Monitor platelets by anticoagulation protocol: Yes   Plan:  - Heparin 4000 units bolus  - Heparin infusion 1600 units/hr - f/u 8 hr heparin level at 2200 - Daily heparin level and CBC - f/u plans for oral anticoagulation  Bayard Hugger, PharmD, BCPS  Clinical Pharmacist  Pager: [phone]   01/11/2015,1:19 PM  Addendum: Switching heparin to lovenox per MD request. Est. crcl > 90 ml/min. Other labs reviewed as above.  Plan: lovenox 130 mg sq Q 12 hrs

## 2015-01-12 ENCOUNTER — Encounter (HOSPITAL_COMMUNITY): Payer: Self-pay | Admitting: Anesthesiology

## 2015-01-12 ENCOUNTER — Encounter (HOSPITAL_COMMUNITY)
Admission: RE | Disposition: A | Discharge status: Skilled Nursing Facility | Payer: Self-pay | Source: Ambulatory Visit | Attending: Orthopaedic Surgery

## 2015-01-12 DIAGNOSIS — M1120 Other chondrocalcinosis, unspecified site: Secondary | ICD-10-CM

## 2015-01-12 HISTORY — PX: IRRIGATION AND DEBRIDEMENT KNEE: SHX5185

## 2015-01-12 LAB — CBC
HEMATOCRIT: 28 % — AB (ref 39.0–52.0)
Hemoglobin: 9.2 g/dL — ABNORMAL LOW (ref 13.0–17.0)
MCH: 27.8 pg (ref 26.0–34.0)
MCHC: 32.9 g/dL (ref 30.0–36.0)
MCV: 84.6 fL (ref 78.0–100.0)
Platelets: 205 10*3/uL (ref 150–400)
RBC: 3.31 MIL/uL — ABNORMAL LOW (ref 4.22–5.81)
RDW: 16.7 % — ABNORMAL HIGH (ref 11.5–15.5)
WBC: 5.1 10*3/uL (ref 4.0–10.5)

## 2015-01-12 LAB — BASIC METABOLIC PANEL
Anion gap: 8 (ref 5–15)
BUN: 10 mg/dL (ref 6–20)
CALCIUM: 7.9 mg/dL — AB (ref 8.9–10.3)
CO2: 28 mmol/L (ref 22–32)
Chloride: 100 mmol/L — ABNORMAL LOW (ref 101–111)
Creatinine, Ser: 0.82 mg/dL (ref 0.61–1.24)
GFR calc Af Amer: >60 mL/min (ref >60)
GLUCOSE: 139 mg/dL — AB (ref 65–99)
POTASSIUM: 3.5 mmol/L (ref 3.5–5.1)
Sodium: 136 mmol/L (ref 135–145)

## 2015-01-12 LAB — SYNOVIAL CELL COUNT + DIFF, W/ CRYSTALS
Eosinophils-Synovial: NONE SEEN % (ref 0–1)
Lymphocytes-Synovial Fld: 3 % (ref 0–20)
Monocyte-Macrophage-Synovial Fluid: 22 % — ABNORMAL LOW (ref 50–90)
NEUTROPHIL, SYNOVIAL: 75 % — AB (ref 0–25)
WBC, Synovial: 8750 /mm3 — ABNORMAL HIGH (ref 0–200)

## 2015-01-12 LAB — GLUCOSE, CAPILLARY
GLUCOSE-CAPILLARY: 154 mg/dL — AB (ref 65–99)
GLUCOSE-CAPILLARY: 100 mg/dL — AB (ref 65–99)
Glucose-Capillary: 140 mg/dL — ABNORMAL HIGH (ref 65–99)
Glucose-Capillary: 163 mg/dL — ABNORMAL HIGH (ref 65–99)
Glucose-Capillary: 109 mg/dL — ABNORMAL HIGH (ref 65–99)

## 2015-01-12 LAB — GRAM STAIN

## 2015-01-12 SURGERY — IRRIGATION AND DEBRIDEMENT KNEE WITH POLY EXCHANGE
Anesthesia: General | Laterality: Right

## 2015-01-12 MED ORDER — ENOXAPARIN SODIUM 150 MG/ML ~~LOC~~ SOLN
1.0000 mg/kg | Freq: Two times a day (BID) | SUBCUTANEOUS | Status: DC
  Administered 2015-01-12 – 2015-01-13 (×2): 130 mg via SUBCUTANEOUS
  Filled 2015-01-12 (×3): qty 1

## 2015-01-12 MED ORDER — ENOXAPARIN SODIUM 40 MG/0.4ML ~~LOC~~ SOLN
130.0000 mg | Freq: Two times a day (BID) | SUBCUTANEOUS | Status: DC
  Administered 2015-01-12: 130 mg via SUBCUTANEOUS
  Filled 2015-01-12 (×3): qty 1.6

## 2015-01-12 NOTE — Progress Notes (Signed)
Subjective: 6 Days Post-Op Procedure(s) (LRB): TOTAL KNEE ARTHROPLASTY (Right) Patient reports pain as mild.  Pt feels better and has not had pain meds over night.  Objective: Vital signs in last 24 hours: Temp:  [98.6 F (37 C)-99.8 F (37.7 C)] 98.6 F (37 C) (07/04 0447) Pulse Rate:  [79-88] 79 (07/04 0447) Resp:  [18] 18 (07/04 0447) BP: (140-151)/(60-71) 146/61 mmHg (07/04 0447) SpO2:  [98 %-100 %] 100 % (07/04 0447)  Intake/Output from previous day: 07/03 0701 - 07/04 0700 In: 240 [P.O.:240] Out: 1950 [Urine:1950] Intake/Output this shift:     Recent Labs  01/10/15 0433 01/11/15 0641 01/12/15 0439  HGB 8.9* 9.4* 9.2*    Recent Labs  01/11/15 0641 01/12/15 0439  WBC 5.8 5.1  RBC 3.48* 3.31*  HCT 29.1* 28.0*  PLT 187 205    Recent Labs  01/11/15 0641 01/12/15 0439  NA 137 136  K 3.7 3.5  CL 104 100*  CO2 25 28  BUN 10 10  CREATININE 0.93 0.82  GLUCOSE 142* 139*  CALCIUM 8.2* 7.9*   No results for input(s): LABPT, INR in the last 72 hours.  Neurologically intact ABD soft Neurovascular intact Sensation intact distally Intact pulses distally Dorsiflexion/Plantar flexion intact Incision: dressing C/D/I and no drainage  Pt feels better with ROM of knee to 50deg manually --denies pain at rest and feels better over the past few days-- trace only effusion Mild warmth and no erythema around incision.  Assessment/Plan: 6 Days Post-Op Procedure(s) (LRB): TOTAL KNEE ARTHROPLASTY (Right) i had contemplated taking patient back to OR for I@D  of r knee but on physical exam he has no real signs of knee infection and he feels better and states his knee pain is better than last several days.  His T max over past 24 hrs is 99.8 and he has not spiked in 72 hrs.  His WBC count has remained low.  His only indication for I@D  was his fluid cell count from knee aspiration at 13K and 100%neutrophils and CRP of 21(though that could be for a variety of reasons).  I spoke  with Dr. Ninetta Lights from ID who feels waiting is appropriate at this point as well given his improvement and overall picture.  I have repeated cell counts and gm stain and culture from knee joint fluid which looks normal for 5 days post op to give additional information about this case but at this point i will restart his lovenox and take him off OR schedule.  Dr Jerl Santos and Greig Castilla will re-evaluate him tomorrow but i feel he will continue to improve and will likely need a slightly longer rehab stay than usual for TKR.     Jacobb Alen L 01/12/2015, 8:42 AM

## 2015-01-12 NOTE — Progress Notes (Signed)
Orthopedic Tech Progress Note Patient Details:  Andrew Winters May 09, 1941 [id number] On cpm at 7:25 pm Patient ID: Andrew Winters, male   DOB: [birth year], 74 y.o.   MRN: [id number]   [name] 01/12/2015, 7:24 PM

## 2015-01-12 NOTE — Progress Notes (Signed)
Physical Therapy Treatment Patient Details Name: Andrew Winters MRN: [id number] DOB: April 06, 1941 Today's Date: 01/12/2015    History of Present Illness R TKA.    PT Comments    Improving bed mobility today with moderate assist from supine to sitting. Sit to stand continues to require assist of +2 plus heavy cues for safety. Patient having difficulty following cues in pm when standing. Patient letting go of walker and not following gait sequence. Had to sit due to fatigue and risk of falling. Able to stand X2 with approximately 1-2 minute tolerance due to fatigue.    Follow Up Recommendations  SNF     Equipment Recommendations  Rolling walker with 5" wheels    Recommendations for Other Services       Precautions / Restrictions Precautions Precautions: Knee;Fall Precaution Booklet Issued: No Required Braces or Orthoses: Knee Immobilizer - Right Restrictions Weight Bearing Restrictions: Yes RLE Weight Bearing: Weight bearing as tolerated    Mobility  Bed Mobility Overal bed mobility: Needs Assistance Bed Mobility: Supine to Sit     Supine to sit: Mod assist     General bed mobility comments: assist with Rt lower extremity  Transfers Overall transfer level: Needs assistance Equipment used: Rolling walker (2 wheeled) Transfers: Sit to/from Stand Sit to Stand: +2 physical assistance         General transfer comment: 3 attempts, 2 successful.   Ambulation/Gait                 Stairs            Wheelchair Mobility    Modified Rankin (Stroke Patients Only)       Balance     Sitting balance-Leahy Scale: Fair       Standing balance-Leahy Scale: Poor                      Cognition Arousal/Alertness: Awake/alert Behavior During Therapy: WFL for tasks assessed/performed Overall Cognitive Status: Impaired/Different from baseline Area of Impairment: Following commands               General Comments: Patient having increased  difficulty following commands.    Exercises Total Joint Exercises Ankle Circles/Pumps: AROM;Both;10 reps Quad Sets: Right;Strengthening;10 reps;Supine Heel Slides: AAROM;10 reps;Supine Hip ABduction/ADduction: Right;10 reps;Supine (mod assist) Straight Leg Raises: Strengthening;Right;10 reps;Supine (mod assist) Goniometric ROM: 9-58    General Comments        Pertinent Vitals/Pain Pain Assessment: 0-10 Pain Score: 5  Pain Location: rt leg Pain Descriptors / Indicators:  (hurts) Pain Intervention(s): Monitored during session    Home Living                      Prior Function            PT Goals (current goals can now be found in the care plan section) Progress towards PT goals: Progressing toward goals (progress varies)    Frequency  7X/week    PT Plan Current plan remains appropriate    Co-evaluation             End of Session Equipment Utilized During Treatment: Gait belt;Right knee immobilizer Activity Tolerance: Patient tolerated treatment well (patient with c/o feeling dizzy, SpO2 99%. ) Patient left: in chair;with call bell/phone within reach     Time: 1430-1451 PT Time Calculation (min) (ACUTE ONLY): 21 min  Charges:  $Therapeutic Exercise: 8-22 mins $Therapeutic Activity: 8-22 mins  G Codes:      Christiane Ha, PT, CSCS Pager [phone] Office [phone]  01/12/2015, 3:00 PM

## 2015-01-12 NOTE — Progress Notes (Signed)
[facility] PROGRESS NOTE  Andrew Winters WUJ:[id number] DOB: [birth year] DOA: 01/06/2015 PCP: Crist Fat, MD  Assessment/Plan: #1 systemic inflammatory response syndrome Unknown etiology. Patient still spiking fevers, with fever curve trending down. Patient has been pancultured and cultures are pending. Lactic acid level within normal limits. Pro calcitonin within normal limits. Urinalysis leukocytes negative, nitrites negative. Chest x-ray negative for any acute infiltrate. Repeat chest x-ray is negative. CT chest negative for PE. Lower extremity Dopplers negative for DVT. Plain films of the right knee negative with postop changes. Clinical exam of the right knee with some tightness, erythema, warmth, tenderness to palpation. Patient's white count although is normal. Left upper extremity Doppler positive for DVT and SVT. Continue full dose Lovenox. Patient's right knee has been aspirated per orthopedics and cultures are pending. Continue empiric IV vancomycin and IV Zosyn. ID has been consulted and following. Please see problem #2.  #2 postop fever/fever Unknown etiology. Patient with a normal white count. Patient has been pancultured and cultures are pending. Urinalysis is negative for UTI. Chest x-ray is negative for any acute infiltrate. Blood cultures are pending. Repeat chest x-ray is negative. CT chest negative for PE. Lower extremity Dopplers negative for DVT. Plain films of the right knee with no acute abnormalities however does show postop changes. Patient with fever curve trending down. Patient afebrile 36 hours. Infectious diseases has been consulted who feel fever may be secondary to aspirin drug reaction versus possible knee infection. Doppler ultrasound of the left upper extremity is positive for DVT and SVT. Patient's right knee on examination is warm tight with tenderness to palpation. Patient status post knee aspiration per orthopedics again this morning. Cultures are pending.  Continue full dose Lovenox. Continue empiric IV vancomycin and IV Zosyn while awaiting culture results. ID following.  #3 left upper extremity DVT Continue full dose Lovenox. Will need to monitor for bleeding. Discussed with orthopedics PA..   #4 acute encephalopathy with history of dementia Likely secondary to metabolic encephalopathy. Patient with clinical improvement and close to baseline per wife. Patient has been pancultured and cultures are pending. Continue empiric IV antibiotics. Follow.  #5. hypertension HCTZ on hold. Continue current regimen. Follow.  #6 diabetes mellitus type 2 CBGs have ranged from 100-154. Discontinued oral hypoglycemic agents. Continue sliding scale insulin.  #7 right knee osteoarthritis Status post right TKA. Dr. Jerl Santos 01/06/2015  #8 prophylaxis Discontinued aspirin due to concerns for possible drug fever .Lovenox has been changed to full dose Lovenox as patient noted to have a DVT in the left upper extremity. Discussed with PA of orthopedics.  Code Status: Full Family Communication: Updated patient. No family at bedside. Disposition Plan: Probably to skilled nursing facility once medically stable and afebrile. Per primary team.   Consultants:  [facility]; Dr Toniann Fail 01/07/2015  Infectious disease: Dr. Ninetta Lights 01/10/2015  Procedures:  CT head 01/08/2015  Chest x-ray 01/07/2015  R TKA per Dr Jerl Santos 01/06/15  CT angiogram chest 01/08/2015  Lower extremity Dopplers 01/09/2015  Left upper extremity Doppler 01/11/2015  Right knee aspiration per orthopedics Dannielle Burn, PA 01/11/2015  Antibiotics:  IV vancomycin 01/07/2015  IV Zosyn 01/07/2015  HPI/Subjective: Patient denies any shortness of breath. No chest pain. Patient states he's feeling a little better.  Objective: Filed Vitals:   01/12/15 0447  BP: 146/61  Pulse: 79  Temp: 98.6 F (37 C)  Resp: 18    Intake/Output Summary (Last 24 hours) at 01/12/15  1250 Last data filed at 01/12/15 0900  Gross per 24  hour  Intake      0 ml  Output   1600 ml  Net  -1600 ml   Filed Weights   01/06/15 0818  Weight: 127.461 kg (281 lb)    Exam:   General:  NAD  Cardiovascular: RRR  Respiratory: CTAB anterior lung fields  Abdomen: Soft, nontender, nondistended, positive bowel sounds  Musculoskeletal: No clubbing cyanosis. Right knee is tight, positive warmth, tenderness to palpation, erythematous. Left upper extremity swelling.   Data Reviewed: Basic Metabolic Panel:  Recent Labs Lab 01/08/15 0008 01/09/15 0519 01/10/15 0433 01/11/15 0641 01/12/15 0439  NA 137 136 134* 137 136  K 4.6 3.9 3.6 3.7 3.5  CL 104 103 101 104 100*  CO2 27 26 27 25 28   GLUCOSE 99 69 136* 142* 139*  BUN 10 13 14 10 10   CREATININE 0.93 0.96 0.96 0.93 0.82  CALCIUM 8.1* 7.8* 7.9* 8.2* 7.9*   Liver Function Tests:  Recent Labs Lab 01/08/15 0008  AST 27  ALT 12*  ALKPHOS 53  BILITOT 0.5  PROT 5.9*  ALBUMIN 3.0*   No results for input(s): LIPASE, AMYLASE in the last 168 hours. No results for input(s): AMMONIA in the last 168 hours. CBC:  Recent Labs Lab 01/08/15 0008 01/08/15 0412 01/09/15 0519 01/10/15 0433 01/11/15 0641 01/12/15 0439  WBC 7.1 7.3 6.9 6.3 5.8 5.1  NEUTROABS 5.1  --   --  4.4  --   --   HGB 10.3* 10.1* 9.3* 8.9* 9.4* 9.2*  HCT 32.0* 31.8* 28.8* 27.5* 29.1* 28.0*  MCV 84.7 84.1 84.5 83.6 83.6 84.6  PLT 144* 147* 128* 151 187 205   Cardiac Enzymes: No results for input(s): CKTOTAL, CKMB, CKMBINDEX, TROPONINI in the last 168 hours. BNP (last 3 results) No results for input(s): BNP in the last 8760 hours.  ProBNP (last 3 results) No results for input(s): PROBNP in the last 8760 hours.  CBG:  Recent Labs Lab 01/11/15 1303 01/11/15 1639 01/11/15 2120 01/12/15 0622 01/12/15 1116  GLUCAP 109* 163* 140* 163* 154*    Recent Results (from the past 240 hour(s))  Culture, Urine     Status: None   Collection Time:  01/07/15 10:20 PM  Result Value Ref Range Status   Specimen Description URINE, CLEAN CATCH  Final   Special Requests NONE  Final   Culture NO GROWTH 1 DAY  Final   Report Status 01/09/2015 FINAL  Final  Culture, blood (routine x 2)     Status: None (Preliminary result)   Collection Time: 01/07/15 11:44 PM  Result Value Ref Range Status   Specimen Description BLOOD LEFT HAND  Final   Special Requests BOTTLES DRAWN AEROBIC ONLY 6CC  Final   Culture NO GROWTH 3 DAYS  Final   Report Status PENDING  Incomplete  Culture, blood (routine x 2)     Status: None (Preliminary result)   Collection Time: 01/08/15 12:08 AM  Result Value Ref Range Status   Specimen Description BLOOD RIGHT ARM  Final   Special Requests BOTTLES DRAWN AEROBIC AND ANAEROBIC 10CC EACH  Final   Culture NO GROWTH 3 DAYS  Final   Report Status PENDING  Incomplete  Anaerobic culture     Status: None (Preliminary result)   Collection Time: 01/11/15 10:08 AM  Result Value Ref Range Status   Specimen Description FLUID RIGHT KNEE  Final   Special Requests NONE  Final   Gram Stain   Final    MODERATE WBC PRESENT, PREDOMINANTLY PMN  NO ORGANISMS SEEN    Culture NO ANAEROBES ISOLATED  Final   Report Status PENDING  Incomplete  Body fluid culture     Status: None (Preliminary result)   Collection Time: 01/11/15 10:08 AM  Result Value Ref Range Status   Specimen Description FLUID RIGHT KNEE  Final   Special Requests NONE  Final   Gram Stain   Final    MODERATE WBC PRESENT, PREDOMINANTLY PMN NO ORGANISMS SEEN GROSSLY BLOODY SPECIMEN    Culture NO GROWTH < 24 HOURS  Final   Report Status PENDING  Incomplete  Gram stain     Status: None   Collection Time: 01/12/15  8:48 AM  Result Value Ref Range Status   Specimen Description FLUID RIGHT KNEE  Final   Special Requests NONE  Final   Gram Stain   Final    FEW WBC PRESENT, PREDOMINANTLY PMN NO ORGANISMS SEEN UNSPUN    Report Status 01/12/2015 FINAL  Final      Studies: No results found.  Scheduled Meds: . amLODipine  5 mg Oral Daily  . docusate sodium  100 mg Oral BID  . donepezil  5 mg Oral QHS  . enoxaparin  130 mg Subcutaneous Q12H  . ferrous sulfate  325 mg Oral TID WC  . fluticasone  1 spray Each Nare Daily  . gabapentin  100 mg Oral TID  . insulin aspart  0-9 Units Subcutaneous TID WC  . lisinopril  2.5 mg Oral BID  . pantoprazole  40 mg Oral Daily  . piperacillin-tazobactam (ZOSYN)  IV  3.375 g Intravenous 3 times per day  . sertraline  25 mg Oral Daily  . sodium chloride  10-40 mL Intracatheter Q12H  . traZODone  50 mg Oral QHS  . vancomycin  1,000 mg Intravenous Q8H   Continuous Infusions:    Principal Problem:   Primary osteoarthritis of right knee Active Problems:   SIRS (systemic inflammatory response syndrome)   Fever   GERD   Diabetes   Morbid obesity   Diabetes mellitus type 2, controlled   Essential hypertension   Acute encephalopathy   Postoperative fever   DVT of upper extremity (deep vein thrombosis): LEFT   Anemia, iron deficiency    Time spent: 40 minutes    [facility] MD [facility] Pager [phone]. If 7PM-7AM, please contact night-coverage at [email or web address], password [facility] 01/12/2015, 12:50 PM  LOS: 6 days

## 2015-01-12 NOTE — Progress Notes (Signed)
INFECTIOUS DISEASE PROGRESS NOTE  ID: Andrew Winters is a 74 y.o. male with  Principal Problem:   Primary osteoarthritis of right knee Active Problems:   GERD   Diabetes   Morbid obesity   Diabetes mellitus type 2, controlled   Essential hypertension   SIRS (systemic inflammatory response syndrome)   Acute encephalopathy   Fever   Postoperative fever   DVT of upper extremity (deep vein thrombosis): LEFT   Anemia, iron deficiency  Subjective: Without complaints  Abtx:  Anti-infectives    Start     Dose/Rate Route Frequency Ordered Stop   01/10/15 1400  vancomycin (VANCOCIN) IVPB 1000 mg/200 mL premix     1,000 mg 200 mL/hr over 60 Minutes Intravenous Every 8 hours 01/10/15 1358     01/08/15 1200  vancomycin (VANCOCIN) IVPB 1000 mg/200 mL premix  Status:  Discontinued     1,000 mg 200 mL/hr over 60 Minutes Intravenous Every 12 hours 01/07/15 2326 01/10/15 1358   01/08/15 0700  piperacillin-tazobactam (ZOSYN) IVPB 3.375 g     3.375 g 12.5 mL/hr over 240 Minutes Intravenous 3 times per day 01/07/15 2326     01/07/15 2330  vancomycin (VANCOCIN) 2,000 mg in sodium chloride 0.9 % 500 mL IVPB     2,000 mg 250 mL/hr over 120 Minutes Intravenous STAT 01/07/15 2326 01/08/15 0306   01/07/15 2300  piperacillin-tazobactam (ZOSYN) IVPB 3.375 g     3.375 g 100 mL/hr over 30 Minutes Intravenous  Once 01/07/15 2244 01/08/15 0016   01/07/15 2245  vancomycin (VANCOCIN) IVPB 1000 mg/200 mL premix  Status:  Discontinued     1,000 mg 200 mL/hr over 60 Minutes Intravenous  Once 01/07/15 2244 01/07/15 2254   01/06/15 1630  ceFAZolin (ANCEF) IVPB 2 g/50 mL premix     2 g 100 mL/hr over 30 Minutes Intravenous Every 6 hours 01/06/15 1606 01/06/15 2250   01/06/15 0830  ceFAZolin (ANCEF) IVPB 2 g/50 mL premix     2 g 100 mL/hr over 30 Minutes Intravenous To [facility] 01/05/15 1347 01/06/15 0955      Medications:  Scheduled: . amLODipine  5 mg Oral Daily  . docusate sodium  100  mg Oral BID  . donepezil  5 mg Oral QHS  . enoxaparin  130 mg Subcutaneous Q12H  . ferrous sulfate  325 mg Oral TID WC  . fluticasone  1 spray Each Nare Daily  . gabapentin  100 mg Oral TID  . insulin aspart  0-9 Units Subcutaneous TID WC  . lisinopril  2.5 mg Oral BID  . pantoprazole  40 mg Oral Daily  . piperacillin-tazobactam (ZOSYN)  IV  3.375 g Intravenous 3 times per day  . sertraline  25 mg Oral Daily  . sodium chloride  10-40 mL Intracatheter Q12H  . traZODone  50 mg Oral QHS  . vancomycin  1,000 mg Intravenous Q8H    Objective: Vital signs in last 24 hours: Temp:  [98.6 F (37 C)-99.8 F (37.7 C)] 99.1 F (37.3 C) (07/04 1300) Pulse Rate:  [76-88] 76 (07/04 1300) Resp:  [16-18] 16 (07/04 1300) BP: (140-151)/(60-71) 149/63 mmHg (07/04 1300) SpO2:  [98 %-100 %] 99 % (07/04 1300)   General appearance: alert, cooperative and no distress Resp: clear to auscultation bilaterally Cardio: regular rate and rhythm GI: normal findings: bowel sounds normal and soft, non-tender Extremities: LUE mild decrease in swelling. R knee warm, swollen.   Lab Results  Recent Labs  01/11/15 [id number] 01/12/15 [phone]  WBC 5.8 5.1  HGB 9.4* 9.2*  HCT 29.1* 28.0*  NA 137 136  K 3.7 3.5  CL 104 100*  CO2 25 28  BUN 10 10  CREATININE 0.93 0.82   Liver Panel No results for input(s): PROT, ALBUMIN, AST, ALT, ALKPHOS, BILITOT, BILIDIR, IBILI in the last 72 hours. Sedimentation Rate  Recent Labs  01/11/15 1055  ESRSEDRATE 86*   C-Reactive Protein  Recent Labs  01/11/15 1055  CRP 21.5*    Microbiology: Recent Results (from the past 240 hour(s))  Culture, Urine     Status: None   Collection Time: 01/07/15 10:20 PM  Result Value Ref Range Status   Specimen Description URINE, CLEAN CATCH  Final   Special Requests NONE  Final   Culture NO GROWTH 1 DAY  Final   Report Status 01/09/2015 FINAL  Final  Culture, blood (routine x 2)     Status: None (Preliminary result)   Collection  Time: 01/07/15 11:44 PM  Result Value Ref Range Status   Specimen Description BLOOD LEFT HAND  Final   Special Requests BOTTLES DRAWN AEROBIC ONLY 6CC  Final   Culture NO GROWTH 3 DAYS  Final   Report Status PENDING  Incomplete  Culture, blood (routine x 2)     Status: None (Preliminary result)   Collection Time: 01/08/15 12:08 AM  Result Value Ref Range Status   Specimen Description BLOOD RIGHT ARM  Final   Special Requests BOTTLES DRAWN AEROBIC AND ANAEROBIC 10CC EACH  Final   Culture NO GROWTH 3 DAYS  Final   Report Status PENDING  Incomplete  Anaerobic culture     Status: None (Preliminary result)   Collection Time: 01/11/15 10:08 AM  Result Value Ref Range Status   Specimen Description FLUID RIGHT KNEE  Final   Special Requests NONE  Final   Gram Stain   Final    MODERATE WBC PRESENT, PREDOMINANTLY PMN NO ORGANISMS SEEN    Culture NO ANAEROBES ISOLATED  Final   Report Status PENDING  Incomplete  Body fluid culture     Status: None (Preliminary result)   Collection Time: 01/11/15 10:08 AM  Result Value Ref Range Status   Specimen Description FLUID RIGHT KNEE  Final   Special Requests NONE  Final   Gram Stain   Final    MODERATE WBC PRESENT, PREDOMINANTLY PMN NO ORGANISMS SEEN GROSSLY BLOODY SPECIMEN    Culture NO GROWTH < 24 HOURS  Final   Report Status PENDING  Incomplete  Gram stain     Status: None   Collection Time: 01/12/15  8:48 AM  Result Value Ref Range Status   Specimen Description FLUID RIGHT KNEE  Final   Special Requests NONE  Final   Gram Stain   Final    FEW WBC PRESENT, PREDOMINANTLY PMN NO ORGANISMS SEEN UNSPUN    Report Status 01/12/2015 FINAL  Final    Studies/Results: No results found.   Assessment/Plan: Post-op fever Pseudogout R TKR LUE edema- DVT DM2  Total days of antibiotics: 6 vanco/zosyn  Fever improved Would stop anbx and watch.  Per uptodate, ~50% of patients with pseudogout can be febrile.  tx of pseudogout and dvt per  primary team Await Cx from his knee fluid        Andrew Winters Infectious Diseases (pager) [phone] [email or web address] 01/12/2015, 1:55 PM  LOS: 6 days

## 2015-01-12 NOTE — Anesthesia Preprocedure Evaluation (Deleted)
Anesthesia Evaluation  Patient identified by MRN, date of birth, ID band Patient awake    Reviewed: Allergy & Precautions, NPO status , Patient's Chart, lab work & pertinent test results  Airway Mallampati: III  TM Distance: >3 FB Neck ROM: full    Dental  (+) Teeth Intact, Dental Advidsory Given   Pulmonary pneumonia -, Current Smoker,  breath sounds clear to auscultation        Cardiovascular hypertension, On Medications + Peripheral Vascular Disease Rhythm:Regular Rate:Normal     Neuro/Psych  Headaches,  Neuromuscular disease    GI/Hepatic hiatal hernia, GERD-  Medicated and Controlled,  Endo/Other  diabetes, Type 2, Oral Hypoglycemic AgentsHyperthyroidism   Renal/GU      Musculoskeletal   Abdominal (+) + obese,   Peds  Hematology   Anesthesia Other Findings   Reproductive/Obstetrics                             Anesthesia Physical  Anesthesia Plan  ASA: III  Anesthesia Plan: General   Post-op Pain Management:    Induction: Intravenous  Airway Management Planned:   Additional Equipment:   Intra-op Plan:   Post-operative Plan: Extubation in OR  Informed Consent: I have reviewed the patients History and Physical, chart, labs and discussed the procedure including the risks, benefits and alternatives for the proposed anesthesia with the patient or authorized representative who has indicated his/her understanding and acceptance.   Dental advisory given and Dental Advisory Given  Plan Discussed with: CRNA, Surgeon and Anesthesiologist  Anesthesia Plan Comments: (Hx of back pain and back surgery prefers Gen anesth)        Anesthesia Quick Evaluation

## 2015-01-12 NOTE — Progress Notes (Signed)
Physical Therapy Treatment Patient Details Name: Andrew Winters MRN: [id number] DOB: 06/01/1941 Today's Date: 01/15/15    History of Present Illness R TKA.    PT Comments    Patient remains slow to mobilize. Focused on knee ROM and strengthening in am session. Will focus on mobility in pm session. Discussed with patient need to continue to progress mobility. Patient did report that he he did have a fall last night but unable to recall what happened. Patient remains assist of +2.   Follow Up Recommendations  SNF     Equipment Recommendations  Rolling walker with 5" wheels    Recommendations for Other Services       Precautions / Restrictions Precautions Precautions: Knee;Fall Precaution Booklet Issued: No Required Braces or Orthoses: Knee Immobilizer - Right Restrictions Weight Bearing Restrictions: Yes RLE Weight Bearing: Weight bearing as tolerated    Mobility  Bed Mobility                  Transfers                    Ambulation/Gait                 Stairs            Wheelchair Mobility    Modified Rankin (Stroke Patients Only)       Balance                                    Cognition Arousal/Alertness: Awake/alert Behavior During Therapy: WFL for tasks assessed/performed Overall Cognitive Status: Within Functional Limits for tasks assessed                      Exercises Total Joint Exercises Ankle Circles/Pumps: AROM;Both;10 reps Quad Sets: Right;Strengthening;10 reps;Supine Heel Slides: AAROM;10 reps;Supine Hip ABduction/ADduction: Right;10 reps;Supine (mod assist) Straight Leg Raises: Strengthening;Right;10 reps;Supine (mod assist) Goniometric ROM: 9-58    General Comments        Pertinent Vitals/Pain Pain Assessment: 0-10 Pain Score: 6  Pain Location: rt knee Pain Descriptors / Indicators:  (hurts) Pain Intervention(s): Monitored during session;Limited activity within patient's  tolerance    Home Living                      Prior Function            PT Goals (current goals can now be found in the care plan section) Progress towards PT goals: Progressing toward goals (slow mobilization increased knee flexion)    Frequency  7X/week    PT Plan Current plan remains appropriate    Co-evaluation             End of Session   Activity Tolerance: Patient tolerated treatment well Patient left: in chair;with call bell/phone within reach;with family/visitor present     Time: 1134-1150 PT Time Calculation (min) (ACUTE ONLY): 16 min  Charges:  $Therapeutic Exercise: 8-22 mins                    G CodesDelton See January 15, 2015, 12:27 PM

## 2015-01-12 NOTE — Progress Notes (Signed)
Fluid from knee this AM shows fewer WBC's with fewer neutrophils and interestingly shows pseudogout. At this point further surgery does not make sense.  I will discuss the findings of the last few days with his primary team and they will follow up with him this week.

## 2015-01-13 DIAGNOSIS — M11261 Other chondrocalcinosis, right knee: Secondary | ICD-10-CM

## 2015-01-13 DIAGNOSIS — D509 Iron deficiency anemia, unspecified: Secondary | ICD-10-CM

## 2015-01-13 DIAGNOSIS — R509 Fever, unspecified: Secondary | ICD-10-CM

## 2015-01-13 DIAGNOSIS — Z9889 Other specified postprocedural states: Secondary | ICD-10-CM

## 2015-01-13 DIAGNOSIS — M11861 Other specified crystal arthropathies, right knee: Secondary | ICD-10-CM

## 2015-01-13 LAB — CULTURE, BLOOD (ROUTINE X 2)
CULTURE: NO GROWTH
Culture: NO GROWTH

## 2015-01-13 LAB — BASIC METABOLIC PANEL
Anion gap: 7 (ref 5–15)
BUN: 9 mg/dL (ref 6–20)
CO2: 29 mmol/L (ref 22–32)
Calcium: 7.9 mg/dL — ABNORMAL LOW (ref 8.9–10.3)
Chloride: 101 mmol/L (ref 101–111)
Creatinine, Ser: 0.82 mg/dL (ref 0.61–1.24)
GFR calc Af Amer: >60 mL/min (ref >60)
GFR calc non Af Amer: >60 mL/min (ref >60)
GLUCOSE: 123 mg/dL — AB (ref 65–99)
Potassium: 3.4 mmol/L — ABNORMAL LOW (ref 3.5–5.1)
SODIUM: 137 mmol/L (ref 135–145)

## 2015-01-13 LAB — GLUCOSE, CAPILLARY
Glucose-Capillary: 119 mg/dL — ABNORMAL HIGH (ref 65–99)
Glucose-Capillary: 121 mg/dL — ABNORMAL HIGH (ref 65–99)
Glucose-Capillary: 105 mg/dL — ABNORMAL HIGH (ref 65–99)
Glucose-Capillary: 112 mg/dL — ABNORMAL HIGH (ref 65–99)

## 2015-01-13 LAB — HEMOGLOBIN A1C
Hgb A1c MFr Bld: 6.9 % — ABNORMAL HIGH (ref 4.8–5.6)
Hgb A1c MFr Bld: 7 % — ABNORMAL HIGH (ref 4.8–5.6)
Mean Plasma Glucose: 151 mg/dL
Mean Plasma Glucose: 154 mg/dL

## 2015-01-13 MED ORDER — APIXABAN 5 MG PO TABS: 5.0000 mg | ORAL_TABLET | Freq: Two times a day (BID) | ORAL | Status: DC

## 2015-01-13 MED ORDER — DOCUSATE SODIUM 100 MG PO CAPS: 100.0000 mg | ORAL_CAPSULE | Freq: Two times a day (BID) | ORAL | Status: DC

## 2015-01-13 MED ORDER — INDOMETHACIN ER 75 MG PO CPCR
75.0000 mg | ORAL_CAPSULE | Freq: Two times a day (BID) | ORAL | Status: DC
  Administered 2015-01-14 (×2): 75 mg via ORAL
  Filled 2015-01-13 (×4): qty 1

## 2015-01-13 MED ORDER — WARFARIN - PHARMACIST DOSING INPATIENT
Freq: Every day | Status: DC
Start: 2015-01-13 — End: 2015-01-13

## 2015-01-13 MED ORDER — MELOXICAM 15 MG PO TABS: 15.0000 mg | ORAL_TABLET | Freq: Every day | ORAL | Status: DC

## 2015-01-13 MED ORDER — FERROUS SULFATE 325 (65 FE) MG PO TABS: 325.0000 mg | ORAL_TABLET | Freq: Three times a day (TID) | ORAL | Status: DC

## 2015-01-13 MED ORDER — INDOMETHACIN ER 75 MG PO CPCR
75.0000 mg | ORAL_CAPSULE | Freq: Two times a day (BID) | ORAL | Status: DC
  Administered 2015-01-13: 75 mg via ORAL
  Filled 2015-01-13 (×5): qty 1

## 2015-01-13 MED ORDER — PATIENT'S GUIDE TO USING COUMADIN BOOK
Freq: Once | Status: DC
  Filled 2015-01-13: qty 1

## 2015-01-13 MED ORDER — APIXABAN 5 MG PO TABS
10.0000 mg | ORAL_TABLET | Freq: Two times a day (BID) | ORAL | Status: DC
Start: 2015-01-13 — End: 2015-01-14
  Administered 2015-01-13: 10 mg via ORAL
  Filled 2015-01-13: qty 2

## 2015-01-13 MED ORDER — WARFARIN SODIUM 7.5 MG PO TABS
7.5000 mg | ORAL_TABLET | Freq: Once | ORAL | Status: DC
  Filled 2015-01-13: qty 1

## 2015-01-13 MED ORDER — APIXABAN 5 MG PO TABS: 10.0000 mg | ORAL_TABLET | Freq: Two times a day (BID) | ORAL | Status: DC

## 2015-01-13 MED ORDER — POTASSIUM CHLORIDE CRYS ER 20 MEQ PO TBCR
40.0000 meq | EXTENDED_RELEASE_TABLET | Freq: Once | ORAL | Status: AC
  Administered 2015-01-13: 40 meq via ORAL
  Filled 2015-01-13: qty 2

## 2015-01-13 MED ORDER — WARFARIN VIDEO: Freq: Once | Status: DC

## 2015-01-13 MED ORDER — INDOMETHACIN ER 75 MG PO CPCR: 75.0000 mg | ORAL_CAPSULE | Freq: Two times a day (BID) | ORAL | Status: DC

## 2015-01-13 NOTE — Progress Notes (Signed)
[facility] PROGRESS NOTE  Andrew Winters RSW:[id number] DOB: [birth year] DOA: 01/06/2015 PCP: Crist Fat, MD  Assessment/Plan: #1 systemic inflammatory response syndrome Unknown etiology. Patient was spiking fevers, with fever curve trending down. Patient has been pancultured and cultures with no growth to date. Patient has been afebrile for at least 48 hours. Knee aspiration with calcium per phosphorus with crystals consistent with pseudogout. Lactic acid level within normal limits. Pro calcitonin within normal limits. Urinalysis leukocytes negative, nitrites negative. Chest x-ray negative for any acute infiltrate. Repeat chest x-ray is negative. CT chest negative for PE. Lower extremity Dopplers negative for DVT. Plain films of the right knee negative with postop changes. Clinical exam of the right knee with some tightness, erythema, warmth, tenderness to palpation. Patient's white count although is normal. Left upper extremity Doppler positive for DVT and SVT. Will transition from full dose Lovenox to eliquis. Will place patient on Indocin twice a day for the next 3 days for suture gout. IV antibiotics have been discontinued per ID and patient is still afebrile. Per ID monitor off on a biotics. If patient is still afebrile tomorrow and stable patient should be okay for discharge to a skilled nursing facility.  #2 postop fever/fever Unknown etiology. Likely secondary to pseudogout. Patient with a normal white count. Patient has been pancultured and cultures are pending with no growth to date. Urinalysis is negative for UTI. Chest x-ray is negative for any acute infiltrate. Blood cultures are pending. Repeat chest x-ray is negative. CT chest negative for PE. Lower extremity Dopplers negative for DVT. Plain films of the right knee with no acute abnormalities however does show postop changes. Patient with fever curve trending down. Patient afebrile 48 hours. Infectious diseases has been consulted  who feel fever may be secondary to pseudogout versus aspirin drug reaction. Doppler ultrasound of the left upper extremity is positive for DVT and SVT. Patient's right knee on examination is warm tight with tenderness to palpation. Patient status post knee aspiration per orthopedics with calcium pyrophosphate crystals consistent with pseudogout. Cultures are pending however no growth to date. Will place patient on Indocin. Will transition from full dose Lovenox to eliquis. IV antibiotics have been discontinued. Monitor off antibiotics. If patient is still afebrile tomorrow off on antibiotics may be stable for discharge to a skilled nursing facility.  #3 left upper extremity DVT Continue full dose Lovenox. We'll transition to eliquis and patient will likely need to be on this for approximately 3-6 months however will defer to PCP. Monitor closely for bleeding.   #4 acute encephalopathy with history of dementia Likely secondary to metabolic encephalopathy. Patient with clinical improvement and close to baseline per wife. Patient has been pancultured and cultures are pending. IV antibiotics have been discontinued.  #5. hypertension HCTZ on hold. Continue current regimen. Follow.  #6 diabetes mellitus type 2 CBGs have ranged from 105-121. Discontinued oral hypoglycemic agents. Continue sliding scale insulin.  #7 right knee osteoarthritis Status post right TKA. Dr. Jerl Santos 01/06/2015  #8 Iron deficiency anemia Hemoglobin stable. Continue oral iron supplementation.  #9 pseudogout Place on Indocin.  #10 prophylaxis Discontinued aspirin due to concerns for possible drug fever .Lovenox has been changed to full dose Lovenox as patient noted to have a DVT in the left upper extremity. Will start patient on eliquis.  If patient is afebrile tomorrow and cultures still negative patient is medically stable for discharge to a skilled nursing facility. Will sign off. Call with questions.  Code Status:  Full Family Communication:  Updated patient. No family at bedside. Disposition Plan: To skilled nursing facility tomorrow if afebrile and no growth on cultures. If afebrile stable from a medical standpoint to be discharged to a skilled nursing facility.   Consultants:  [facility]; Dr Toniann Fail 01/07/2015  Infectious disease: Dr. Ninetta Lights 01/10/2015  Procedures:  CT head 01/08/2015  Chest x-ray 01/07/2015  R TKA per Dr Jerl Santos 01/06/15  CT angiogram chest 01/08/2015  Lower extremity Dopplers 01/09/2015  Left upper extremity Doppler 01/11/2015  Right knee aspiration per orthopedics Dannielle Burn, PA 01/11/2015  Right knee aspiration per orthopedics, Dr. Luiz Blare 01/12/2015  Antibiotics:  IV vancomycin 01/07/2015>>>>>> 01/12/2015  IV Zosyn 01/07/2015>>>>>> 01/12/2015  HPI/Subjective: Patient denies any shortness of breath. No chest pain. Patient with no complaints.  Objective: Filed Vitals:   01/13/15 1500  BP: 133/78  Pulse: 82  Temp: 98.1 F (36.7 C)  Resp: 18    Intake/Output Summary (Last 24 hours) at 01/13/15 1853 Last data filed at 01/13/15 1808  Gross per 24 hour  Intake    820 ml  Output   1325 ml  Net   -505 ml   Filed Weights   01/06/15 0818  Weight: 127.461 kg (281 lb)    Exam:   General:  NAD  Cardiovascular: RRR  Respiratory: CTAB anterior lung fields  Abdomen: Soft, nontender, nondistended, positive bowel sounds  Musculoskeletal: No clubbing cyanosis. Right knee is less tight, less warmth, tenderness to palpation, erythematous. Left upper extremity swelling.   Data Reviewed: Basic Metabolic Panel:  Recent Labs Lab 01/09/15 0519 01/10/15 0433 01/11/15 0641 01/12/15 0439 01/13/15 0425  NA 136 134* 137 136 137  K 3.9 3.6 3.7 3.5 3.4*  CL 103 101 104 100* 101  CO2 26 27 25 28 29   GLUCOSE 69 136* 142* 139* 123*  BUN 13 14 10 10 9   CREATININE 0.96 0.96 0.93 0.82 0.82  CALCIUM 7.8* 7.9* 8.2* 7.9* 7.9*   Liver Function  Tests:  Recent Labs Lab 01/08/15 0008  AST 27  ALT 12*  ALKPHOS 53  BILITOT 0.5  PROT 5.9*  ALBUMIN 3.0*   No results for input(s): LIPASE, AMYLASE in the last 168 hours. No results for input(s): AMMONIA in the last 168 hours. CBC:  Recent Labs Lab 01/08/15 0008 01/08/15 0412 01/09/15 0519 01/10/15 0433 01/11/15 0641 01/12/15 0439  WBC 7.1 7.3 6.9 6.3 5.8 5.1  NEUTROABS 5.1  --   --  4.4  --   --   HGB 10.3* 10.1* 9.3* 8.9* 9.4* 9.2*  HCT 32.0* 31.8* 28.8* 27.5* 29.1* 28.0*  MCV 84.7 84.1 84.5 83.6 83.6 84.6  PLT 144* 147* 128* 151 187 205   Cardiac Enzymes: No results for input(s): CKTOTAL, CKMB, CKMBINDEX, TROPONINI in the last 168 hours. BNP (last 3 results) No results for input(s): BNP in the last 8760 hours.  ProBNP (last 3 results) No results for input(s): PROBNP in the last 8760 hours.  CBG:  Recent Labs Lab 01/12/15 1609 01/12/15 2135 01/13/15 0642 01/13/15 1205 01/13/15 1747  GLUCAP 100* 109* 119* 121* 105*    Recent Results (from the past 240 hour(s))  Culture, Urine     Status: None   Collection Time: 01/07/15 10:20 PM  Result Value Ref Range Status   Specimen Description URINE, CLEAN CATCH  Final   Special Requests NONE  Final   Culture NO GROWTH 1 DAY  Final   Report Status 01/09/2015 FINAL  Final  Culture, blood (routine x 2)  Status: None   Collection Time: 01/07/15 11:44 PM  Result Value Ref Range Status   Specimen Description BLOOD LEFT HAND  Final   Special Requests BOTTLES DRAWN AEROBIC ONLY 6CC  Final   Culture NO GROWTH 5 DAYS  Final   Report Status 01/13/2015 FINAL  Final  Culture, blood (routine x 2)     Status: None   Collection Time: 01/08/15 12:08 AM  Result Value Ref Range Status   Specimen Description BLOOD RIGHT ARM  Final   Special Requests BOTTLES DRAWN AEROBIC AND ANAEROBIC 10CC EACH  Final   Culture NO GROWTH 5 DAYS  Final   Report Status 01/13/2015 FINAL  Final  Anaerobic culture     Status: None  (Preliminary result)   Collection Time: 01/11/15 10:08 AM  Result Value Ref Range Status   Specimen Description FLUID RIGHT KNEE  Final   Special Requests NONE  Final   Gram Stain   Final    MODERATE WBC PRESENT, PREDOMINANTLY PMN NO ORGANISMS SEEN    Culture NO ANAEROBES ISOLATED  Final   Report Status PENDING  Incomplete  Body fluid culture     Status: None (Preliminary result)   Collection Time: 01/11/15 10:08 AM  Result Value Ref Range Status   Specimen Description FLUID RIGHT KNEE  Final   Special Requests NONE  Final   Gram Stain   Final    MODERATE WBC PRESENT, PREDOMINANTLY PMN NO ORGANISMS SEEN GROSSLY BLOODY SPECIMEN    Culture NO GROWTH 2 DAYS  Final   Report Status PENDING  Incomplete  Culture, body fluid-bottle     Status: None (Preliminary result)   Collection Time: 01/12/15  8:48 AM  Result Value Ref Range Status   Specimen Description FLUID RIGHT KNEE  Final   Special Requests NONE  Final   Culture NO GROWTH 1 DAY  Final   Report Status PENDING  Incomplete  Gram stain     Status: None   Collection Time: 01/12/15  8:48 AM  Result Value Ref Range Status   Specimen Description FLUID RIGHT KNEE  Final   Special Requests NONE  Final   Gram Stain   Final    FEW WBC PRESENT, PREDOMINANTLY PMN NO ORGANISMS SEEN UNSPUN    Report Status 01/12/2015 FINAL  Final     Studies: No results found.  Scheduled Meds: . amLODipine  5 mg Oral Daily  . apixaban  10 mg Oral BID   Followed by  . [START ON 01/20/2015] apixaban  5 mg Oral BID  . docusate sodium  100 mg Oral BID  . donepezil  5 mg Oral QHS  . ferrous sulfate  325 mg Oral TID WC  . fluticasone  1 spray Each Nare Daily  . gabapentin  100 mg Oral TID  . [START ON 01/14/2015] indomethacin  75 mg Oral BID WC  . insulin aspart  0-9 Units Subcutaneous TID WC  . lisinopril  2.5 mg Oral BID  . pantoprazole  40 mg Oral Daily  . sertraline  25 mg Oral Daily  . sodium chloride  10-40 mL Intracatheter Q12H  .  traZODone  50 mg Oral QHS   Continuous Infusions:    Principal Problem:   Primary osteoarthritis of right knee Active Problems:   SIRS (systemic inflammatory response syndrome)   Fever   GERD   Diabetes   Morbid obesity   Diabetes mellitus type 2, controlled   Essential hypertension   Acute encephalopathy   Postoperative  fever   DVT of upper extremity (deep vein thrombosis): LEFT   Anemia, iron deficiency   Pseudogout of right knee    Time spent: 40 minutes    [facility] MD [facility] Pager [phone]. If 7PM-7AM, please contact night-coverage at [email or web address], password [facility] 01/13/2015, 6:53 PM  LOS: 7 days

## 2015-01-13 NOTE — Progress Notes (Signed)
Patient ID: Andrew Winters, male   DOB: 07/02/1941, 74 y.o.   MRN: [id number]         [facility]    Date of Admission:  01/06/2015     Principal Problem:   Primary osteoarthritis of right knee Active Problems:   GERD   Diabetes   Morbid obesity   Diabetes mellitus type 2, controlled   Essential hypertension   SIRS (systemic inflammatory response syndrome)   Acute encephalopathy   Fever   Postoperative fever   DVT of upper extremity (deep vein thrombosis): LEFT   Anemia, iron deficiency   Pseudogout of right knee   . amLODipine  5 mg Oral Daily  . apixaban  10 mg Oral BID   Followed by  . [START ON 01/20/2015] apixaban  5 mg Oral BID  . docusate sodium  100 mg Oral BID  . donepezil  5 mg Oral QHS  . ferrous sulfate  325 mg Oral TID WC  . fluticasone  1 spray Each Nare Daily  . gabapentin  100 mg Oral TID  . indomethacin  75 mg Oral BID WC  . insulin aspart  0-9 Units Subcutaneous TID WC  . lisinopril  2.5 mg Oral BID  . pantoprazole  40 mg Oral Daily  . sertraline  25 mg Oral Daily  . sodium chloride  10-40 mL Intracatheter Q12H  . traZODone  50 mg Oral QHS    Subjective: He is still having some right knee pain.  Review of Systems: Review of systems not obtained due to patient factors.  Past Medical History  Diagnosis Date  . Bilateral swelling of feet   . Headaches, cluster   . Anxiety   . Fatigue   . Rheumatoid arthritis(714.0)   . Muscle pain   . Hiatal hernia   . Vitamin D deficiency     takes Vit D daily  . BPH (benign prostatic hyperplasia)     no meds needed per medical md  . Osteoarthritis   . Hyperthyroidism   . Deafness     in left ear   . Hyperlipemia     was on meds but hasnt been on anything in about 87months per MD  . Presbyesophagus   . Insomnia     takes Trazodone nightly  . Depression     takes Zoloft daily  . Dysphagia   . Hypertension     takes Amlodipine,HCTZ,and Lisinopril daily  . Pneumonia 3+  yrs ago  . Peripheral neuropathy     takes Gabapentin daily  . Joint pain   . Joint swelling   . GERD (gastroesophageal reflux disease)     takes Omeprazole daily  . Chronic diarrhea   . Nocturia   . Diabetes mellitus     takes Actos and Amaryl daily as well as Onglyza  . Cataracts, bilateral     immature    History  Substance Use Topics  . Smoking status: Current Some Day Smoker -- 1.00 packs/day for 35 years    Types: Cigarettes  . Smokeless tobacco: Never Used     Comment: pack last about 2-3wks  . Alcohol Use: No    Family History  Problem Relation Age of Onset  . Heart disease Mother   . Diabetes Mother   . Heart attack Father   . Colon cancer Neg Hx   . Diabetes Brother    Allergies  Allergen Reactions  . Aspirin Diarrhea    OBJECTIVE: Blood pressure  126/63, pulse 72, temperature 98.8 F (37.1 C), temperature source Oral, resp. rate 16, weight 281 lb (127.461 kg), SpO2 100 %. General: He is laying comfortably in bed Right leg: Diffuse swelling around knee with some warmth. No redness. Fresh surgical incision is covered with a clean dry dressing.  Lab Results Lab Results  Component Value Date   WBC 5.1 01/12/2015   HGB 9.2* 01/12/2015   HCT 28.0* 01/12/2015   MCV 84.6 01/12/2015   PLT 205 01/12/2015    Lab Results  Component Value Date   CREATININE 0.82 01/13/2015   BUN 9 01/13/2015   NA 137 01/13/2015   K 3.4* 01/13/2015   CL 101 01/13/2015   CO2 29 01/13/2015    Lab Results  Component Value Date   ALT 12* 01/08/2015   AST 27 01/08/2015   ALKPHOS 53 01/08/2015   BILITOT 0.5 01/08/2015     Microbiology: Recent Results (from the past 240 hour(s))  Culture, Urine     Status: None   Collection Time: 01/07/15 10:20 PM  Result Value Ref Range Status   Specimen Description URINE, CLEAN CATCH  Final   Special Requests NONE  Final   Culture NO GROWTH 1 DAY  Final   Report Status 01/09/2015 FINAL  Final  Culture, blood (routine x 2)      Status: None   Collection Time: 01/07/15 11:44 PM  Result Value Ref Range Status   Specimen Description BLOOD LEFT HAND  Final   Special Requests BOTTLES DRAWN AEROBIC ONLY 6CC  Final   Culture NO GROWTH 5 DAYS  Final   Report Status 01/13/2015 FINAL  Final  Culture, blood (routine x 2)     Status: None   Collection Time: 01/08/15 12:08 AM  Result Value Ref Range Status   Specimen Description BLOOD RIGHT ARM  Final   Special Requests BOTTLES DRAWN AEROBIC AND ANAEROBIC 10CC EACH  Final   Culture NO GROWTH 5 DAYS  Final   Report Status 01/13/2015 FINAL  Final  Anaerobic culture     Status: None (Preliminary result)   Collection Time: 01/11/15 10:08 AM  Result Value Ref Range Status   Specimen Description FLUID RIGHT KNEE  Final   Special Requests NONE  Final   Gram Stain   Final    MODERATE WBC PRESENT, PREDOMINANTLY PMN NO ORGANISMS SEEN    Culture NO ANAEROBES ISOLATED  Final   Report Status PENDING  Incomplete  Body fluid culture     Status: None (Preliminary result)   Collection Time: 01/11/15 10:08 AM  Result Value Ref Range Status   Specimen Description FLUID RIGHT KNEE  Final   Special Requests NONE  Final   Gram Stain   Final    MODERATE WBC PRESENT, PREDOMINANTLY PMN NO ORGANISMS SEEN GROSSLY BLOODY SPECIMEN    Culture NO GROWTH 2 DAYS  Final   Report Status PENDING  Incomplete  Culture, body fluid-bottle     Status: None (Preliminary result)   Collection Time: 01/12/15  8:48 AM  Result Value Ref Range Status   Specimen Description FLUID RIGHT KNEE  Final   Special Requests NONE  Final   Culture NO GROWTH 1 DAY  Final   Report Status PENDING  Incomplete  Gram stain     Status: None   Collection Time: 01/12/15  8:48 AM  Result Value Ref Range Status   Specimen Description FLUID RIGHT KNEE  Final   Special Requests NONE  Final   Gram Stain  Final    FEW WBC PRESENT, PREDOMINANTLY PMN NO ORGANISMS SEEN UNSPUN    Report Status 01/12/2015 FINAL  Final     Assessment: I doubt that he has early postoperative infection and would continue observation off antibiotics. Recent fever may have been due to pseudogout.  Plan: 1. Observe off of antibiotics  Cliffton Asters, MD [facility] [phone] pager   [phone] cell 01/13/2015, 5:32 PM

## 2015-01-13 NOTE — Progress Notes (Signed)
Physical Therapy Treatment Patient Details Name: Andrew Winters MRN: [id number] DOB: 09/30/1940 Today's Date: 01/13/2015    History of Present Illness 74 y.o. male s/p R TKR 6/28. With LUE edema, and found to have acute LUE DVT while in hopsital post surgery.     PT Comments    Patient more fatigued for afternoon session and not appropriate for longer ambulation. Modified with increased time standing and increased exercise focus. Continuing skilled PT services at this time.   Follow Up Recommendations  SNF     Equipment Recommendations  Rolling walker with 5" wheels    Recommendations for Other Services       Precautions / Restrictions Precautions Precautions: Knee;Fall Precaution Comments: Reviewed no pillow under knee Required Braces or Orthoses: Knee Immobilizer - Right Knee Immobilizer - Right:  (not specified in order) Restrictions Weight Bearing Restrictions: Yes RLE Weight Bearing: Weight bearing as tolerated    Mobility  Bed Mobility Overal bed mobility: Needs Assistance Bed Mobility: Sit to Supine    General bed mobility comments: immobilizer on with sit/supine transfer  Transfers Overall transfer level: Needs assistance Equipment used: Rolling walker (2 wheeled) Transfers: Sit to/from Stand Sit to Stand: +2 physical assistance    General transfer comment: from recliner  Ambulation/Gait Ambulation/Gait assistance: Mod assist (cues for sequence) Ambulation Distance (Feet): 3 Feet Assistive device: Rolling walker (2 wheeled) Gait Pattern/deviations: Step-to pattern     General Gait Details: repeated cues for gait sequence    Stairs            Wheelchair Mobility    Modified Rankin (Stroke Patients Only)       Balance Overall balance assessment: Needs assistance Sitting-balance support: No upper extremity supported Sitting balance-Leahy Scale: Good     Standing balance support: Bilateral upper extremity supported Standing  balance-Leahy Scale: Poor                      Cognition Arousal/Alertness: Awake/alert Behavior During Therapy: WFL for tasks assessed/performed Overall Cognitive Status: Within Functional Limits for tasks assessed (Inconsistend need for reorientation to tasks) Area of Impairment: Following commands     Memory: Decreased recall of precautions Following Commands: Follows one step commands with increased time            Exercises Total Joint Exercises Ankle Circles/Pumps: AROM;Both;10 reps;Other (comment) (recliner) Quad Sets: Strengthening;Right;10 reps;Other (comment) (recliner) Heel Slides: AAROM;Right;10 reps;Other (comment) (recliner) Straight Leg Raises: Strengthening;Right;10 reps;Other (comment) (moderate assist, in recliner) Goniometric ROM: 8-52 Other Exercises Other Exercises: standing lateral weight shifts    General Comments        Pertinent Vitals/Pain Pain Assessment: 0-10 Pain Score: 4  Pain Location: Rt knee Pain Descriptors / Indicators: Aching Pain Intervention(s): Monitored during session    Home Living                      Prior Function            PT Goals (current goals can now be found in the care plan section) Acute Rehab PT Goals PT Goal Formulation: With patient Time For Goal Achievement: 01/21/15 Potential to Achieve Goals: Good Progress towards PT goals: Progressing toward goals    Frequency  7X/week    PT Plan Current plan remains appropriate    Co-evaluation             End of Session Equipment Utilized During Treatment: Gait belt;Right knee immobilizer Activity Tolerance: Patient limited by fatigue Patient left:  in bed;with call bell/phone within reach     Time: 1342-1410 PT Time Calculation (min) (ACUTE ONLY): 28 min  Charges:  $Gait Training: 8-22 mins $Therapeutic Exercise: 8-22 mins $Therapeutic Activity: 8-22 mins                    G Codes:      Christiane Ha, PT, CSCS Pager [phone] Office [phone]  01/13/2015, 2:26 PM

## 2015-01-13 NOTE — Progress Notes (Addendum)
Subjective: 7 Days Post-Op Procedure(s) (LRB): TOTAL KNEE ARTHROPLASTY (Right)   Patient sitting up alert in bed and doing well. No major complaints. His wife is by his bedside.  Activity level:  wbat Diet tolerance:  ok Voiding:  ok Patient reports pain as mild.    Objective: Vital signs in last 24 hours: Temp:  [98.8 F (37.1 C)-99.1 F (37.3 C)] 98.8 F (37.1 C) (07/05 0542) Pulse Rate:  [72-80] 72 (07/05 0542) Resp:  [16-18] 16 (07/05 0542) BP: (126-149)/(59-63) 126/63 mmHg (07/05 0542) SpO2:  [98 %-100 %] 100 % (07/05 0542)  Labs:  Recent Labs  01/11/15 0641 01/12/15 0439  HGB 9.4* 9.2*    Recent Labs  01/11/15 0641 01/12/15 0439  WBC 5.8 5.1  RBC 3.48* 3.31*  HCT 29.1* 28.0*  PLT 187 205    Recent Labs  01/12/15 0439 01/13/15 0425  NA 136 137  K 3.5 3.4*  CL 100* 101  CO2 28 29  BUN 10 9  CREATININE 0.82 0.82  GLUCOSE 139* 123*  CALCIUM 7.9* 7.9*   No results for input(s): LABPT, INR in the last 72 hours.  Physical Exam:  Neurologically intact ABD soft Neurovascular intact Sensation intact distally Intact pulses distally Dorsiflexion/Plantar flexion intact Incision: dressing C/D/I and no drainage No cellulitis present Compartment soft  Assessment/Plan:  7 Days Post-Op Procedure(s) (LRB): TOTAL KNEE ARTHROPLASTY (Right) Up with therapy Discharge to SNF today or Tomorrow once cleared by medical team as he is good to go per orthopaedics.  We will bridge him from Lovenox to coumadin for his left upper extremity DVT. Right TKR aspirate shows pseudogout and we will continue to follow.  Follow up in office 7 days. Patient is at his baseline mental status per wife. We greatly appreciate medicine and infectious disease consults and management.   Andrew Winters, Ginger Organ 01/13/2015, 10:03 AM

## 2015-01-13 NOTE — Progress Notes (Signed)
ANTICOAGULATION CONSULT NOTE - Follow Up Consult  Pharmacy Consult for  Lovenox/Coumadin to Eliquis Indication: DVT of LUE  Allergies  Allergen Reactions  . Aspirin Diarrhea    Patient Measurements: Height: 73 inches Weight: 281 lb (127.461 kg)  Vital Signs: Temp: 98.8 F (37.1 C) (07/05 0542) BP: 126/63 mmHg (07/05 0542) Pulse Rate: 72 (07/05 0542)  Labs:  Recent Labs  01/11/15 0641 01/12/15 0439 01/13/15 0425  HGB 9.4* 9.2*  --   HCT 29.1* 28.0*  --   PLT 187 205  --   CREATININE 0.93 0.82 0.82    Estimated Creatinine Clearance: 112.2 mL/min (by C-G formula based on Cr of 0.82).  Assessment:   74 y.o. male s/p R TKR 6/28. With LUE edema, and found to have acute LUE DVT. Has been on full dose Lovenox since 7/3 PM.   Was to begin Coumadin tonight, but now to change to Eliquis.  Discussed with A. Nida, PA-C with Orthopedics and Dr. Janee Morn. First Coumadin dose had been scheduled for 6pm tonight, so not yet given.  Last Lovenox 130 mg sq dose given at 10am today.  Goal of Therapy:  therapeutic anticoagulation with Eliquis Monitor platelets by anticoagulation protocol: Yes   Plan:   Cancel Coumadin; discontinue Lovenox.  Eliquis 10 mg BID x 7 days to begin tonight at 8pm, about 2 hours before next Lovenox dose was due.  After 7 days, decrease Eliquis to 5 mg BID.  Intermittent CBC.  Dennie Fetters, [place] Pager: [phone] 01/13/2015,5:29 PM

## 2015-01-13 NOTE — Care Management Note (Signed)
Case Management Note  Patient Details  Name: Andrew Winters MRN: [id number] Date of Birth: [birth year]  Subjective/Objective:  74 yr old male admitted with osteoarthritis of the right knee. Patient had a right total knee arthroplasty.             Action/Plan: Patient will need short term rehab at SNF. Social worker is aware. Patient will go to [facility].   Expected Discharge Date:                  Expected Discharge Plan:  Skilled Nursing facility   In-House Referral:     Discharge planning Services     Post Acute Care Choice:    Choice offered to:     DME Arranged: NA    DME Agency:     HH Arranged: NA    HH Agency:     Status of Service: completed.    Medicare Important Message Given:  Yes-third notification given Date Medicare IM Given:    Medicare IM give by:    Date Additional Medicare IM Given:    Additional Medicare Important Message give by:     If discussed at Long Length of Stay Meetings, dates discussed:    Additional Comments:  Durenda Guthrie, RN 01/13/2015, 11:44 AM

## 2015-01-13 NOTE — Progress Notes (Signed)
ANTICOAGULATION CONSULT NOTE  Pharmacy Consult for warfarin and Lovenox Indication: DVT  Allergies  Allergen Reactions  . Aspirin Diarrhea    Patient Measurements: Weight: 281 lb (127.461 kg) Heparin Dosing Weight: 108 kg  Vital Signs: Temp: 98.8 F (37.1 C) (07/05 0542) BP: 126/63 mmHg (07/05 0542) Pulse Rate: 72 (07/05 0542)  Labs:  Recent Labs  01/11/15 0641 01/12/15 0439 01/13/15 0425  HGB 9.4* 9.2*  --   HCT 29.1* 28.0*  --   PLT 187 205  --   CREATININE 0.93 0.82 0.82    Estimated Creatinine Clearance: 112.2 mL/min (by C-G formula based on Cr of 0.82).  Assessment: 74 y.o. male s/p R TKR 6/28. With LUE edema, and found to have acute LUE DVT. Has been on full dose Lovenox since 7/3 PM, and now to start warfarin for long term anticoagulation.   Goal of Therapy:  Heparin level 0.3-0.7 units/ml Monitor platelets by anticoagulation protocol: Yes   Plan:  -Lovenox 130mg  subQ q12h -warfarin 7.5mg  po x1 tonight -if patient is discharged today would recommend warfarin 7.5mg  po daily with an INR check on Friday 7/8 -today is D#1/5 of required overlap with Lovenox and warfarin for DVT treatment per guidelines. INR must be therapeutic for 24h prior to stopping Lovenox. -daily INR while here -CBC q72h minimum while here  Ceazia Harb D. Karlina Suares, PharmD, BCPS Clinical Pharmacist Pager: [phone] 01/13/2015 10:21 AM

## 2015-01-13 NOTE — Clinical Social Work Note (Signed)
CSW continuing to follow and assist with SNF placement.  Placement pending medical stability and clearance for transfer.  Disposition: SNF- [facility]: Wife, Dois Davenport [phone]  Vickii Penna, LCSW [phone]  Psychiatric & Orthopedics (5N 1-8) Clinical Social Worker

## 2015-01-13 NOTE — Care Management (Signed)
Important Message  Patient Details  Name: Andrew Winters MRN: [id number] Date of Birth: 08-10-1940   Medicare Important Message Given:  Yes-third notification given    Bernadette Hoit 01/13/2015, 10:56 AM

## 2015-01-13 NOTE — Progress Notes (Signed)
Occupational Therapy Treatment Patient Details Name: Andrew Winters MRN: [id number] DOB: 11/12/1940 Today's Date: 01/13/2015    History of present illness 74 y.o. male s/p R TKR 6/28. With LUE edema, and found to have acute LUE DVT while in hopsital post surgery.    OT comments  Patient making slow progress towards OT goals, will continue plan of care for now. Plan to re-assess next OT therapy session. Patient's hospital course limited by LUE DVT. Recommending ST-SNF from rehab post acute d/c, wife and patient agreeable to this.    Follow Up Recommendations  Supervision/Assistance - 24 hour;SNF    Equipment Recommendations  Other (comment) (TBD next venue of care)    Recommendations for Other Services  None at this time   Precautions / Restrictions Precautions Precautions: Knee;Fall Precaution Comments: Reviewed no pillow under knee Required Braces or Orthoses: Knee Immobilizer - Right Knee Immobilizer - Right:  (not specified in order) Restrictions Weight Bearing Restrictions: Yes RLE Weight Bearing: Weight bearing as tolerated    Mobility Bed Mobility Overal bed mobility: Needs Assistance Bed Mobility: Supine to Sit     Supine to sit: Min guard     General bed mobility comments: Min guard assist for safety. Pt able to manage RLE to sit EOB. Pt scooted to foot of bed to prevent discomfort from bed rail.   Transfers Overall transfer level: Needs assistance Equipment used: Rolling walker (2 wheeled) Transfers: Sit to/from [employer] Sit to Stand: Min assist;+2 safety/equipment;From elevated surface (high elevated surface) Stand pivot transfers: Min assist;+2 safety/equipment       General transfer comment: Modified recliner with pillows to elevate surface for increased independence with standing from recliner.     Balance Overall balance assessment: Needs assistance Sitting-balance support: No upper extremity supported;Feet supported Sitting  balance-Leahy Scale: Good     Standing balance support: Bilateral upper extremity supported;During functional activity Standing balance-Leahy Scale: Fair   ADL Overall ADL's : Needs assistance/impaired   General ADL Comments: Pt continues to require up to total assist for LB ADLs. Pt limited by decreased overall strength and endurance and decreased ROM. Pt engaged in bed mobility, scooted -> foot of bed, then transferred -> recliner with min assist +2 needed for safety. Pt then sat in recliner for grooming tasks of washing face and brushing teeth. Reiterated importance of no pillow under knee and elevated BLEs. Wife present in room during entire session. Both agree with ST-SNF post acute d/c.      Cognition   Behavior During Therapy: WFL for tasks assessed/performed Overall Cognitive Status: Impaired/Different from baseline Area of Impairment: Following commands     Memory: Decreased recall of precautions  Following Commands: Follows one step commands with increased time                Pertinent Vitals/ Pain       Pain Assessment: 0-10 Pain Score: 6  Pain Location: RLE knee Pain Descriptors / Indicators: Aching;Discomfort Pain Intervention(s): Monitored during session;Repositioned   Frequency Min 2X/week     Progress Toward Goals  OT Goals(current goals can now befound in the care plan section)  Progress towards OT goals: Progressing toward goals;OT to reassess next treatment     Plan Discharge plan needs to be updated    End of Session Equipment Utilized During Treatment: Gait belt;Rolling walker;Right knee immobilizer CPM Right Knee CPM Right Knee: Off   Activity Tolerance Patient tolerated treatment well   Patient Left in chair;with call bell/phone within reach;with family/visitor  present     Time: 1002-1033 OT Time Calculation (min): 31 min  Charges: OT General Charges $OT Visit: 1 Procedure OT Evaluation $Initial OT Evaluation Tier I: 1 Procedure OT  Treatments $Self Care/Home Management : 23-37 mins  [name] , MS, OTR/L, CLT Pager: [phone]  01/13/2015, 10:44 AM

## 2015-01-13 NOTE — Progress Notes (Signed)
Physical Therapy Treatment Patient Details Name: Andrew Winters MRN: [id number] DOB: 1941-01-19 Today's Date: 01/13/2015    History of Present Illness 74 y.o. male s/p R TKR 6/28. With LUE edema, and found to have acute LUE DVT while in hopsital post surgery.     PT Comments    Patient is making progress with PT with morning session.  From a mobility standpoint anticipate patient will be ready for DC to SNF for further rehabilitation. Patient in agreement.     Follow Up Recommendations  SNF     Equipment Recommendations  Rolling walker with 5" wheels    Recommendations for Other Services       Precautions / Restrictions Precautions Precautions: Knee;Fall Precaution Comments: Reviewed no pillow under knee Required Braces or Orthoses: Knee Immobilizer - Right Knee Immobilizer - Right:  (not specified in order) Restrictions Weight Bearing Restrictions: Yes RLE Weight Bearing: Weight bearing as tolerated    Mobility  Bed Mobility Overal bed mobility: Needs Assistance Bed Mobility: Supine to Sit     Supine to sit: Min guard     General bed mobility comments: Min guard assist for safety. Pt able to manage RLE to sit EOB. Pt scooted to foot of bed to prevent discomfort from bed rail.   Transfers Overall transfer level: Needs assistance Equipment used: Rolling walker (2 wheeled) Transfers: Sit to/from Stand Sit to Stand: +2 physical assistance Stand pivot transfers: Min assist;+2 safety/equipment       General transfer comment: from recliner  Ambulation/Gait Ambulation/Gait assistance: Mod assist Ambulation Distance (Feet): 60 Feet Assistive device: Rolling walker (2 wheeled) (XL bariatric walker) Gait Pattern/deviations: Step-to pattern     General Gait Details: repeated cues for gait sequence    Stairs            Wheelchair Mobility    Modified Rankin (Stroke Patients Only)       Balance Overall balance assessment: Needs  assistance Sitting-balance support: No upper extremity supported;Feet supported Sitting balance-Leahy Scale: Good     Standing balance support: Bilateral upper extremity supported;During functional activity Standing balance-Leahy Scale: Fair                      Cognition Arousal/Alertness: Awake/alert Behavior During Therapy: WFL for tasks assessed/performed Overall Cognitive Status: Within Functional Limits for tasks assessed Area of Impairment: Following commands     Memory: Decreased recall of precautions Following Commands: Follows one step commands with increased time            Exercises      General Comments        Pertinent Vitals/Pain Pain Assessment: 0-10 Pain Score: 4  Pain Location: Rt knee Pain Descriptors / Indicators: Sore Pain Intervention(s): Monitored during session    Home Living                      Prior Function            PT Goals (current goals can now be found in the care plan section) Acute Rehab PT Goals PT Goal Formulation: With patient Time For Goal Achievement: 01/21/15 Potential to Achieve Goals: Good Progress towards PT goals: Progressing toward goals    Frequency  7X/week    PT Plan Current plan remains appropriate    Co-evaluation             End of Session Equipment Utilized During Treatment: Gait belt;Right knee immobilizer Activity Tolerance: Patient limited by fatigue Patient left:  in chair;with call bell/phone within reach;with family/visitor present     Time: 9935-7017 PT Time Calculation (min) (ACUTE ONLY): 18 min  Charges:  $Gait Training: 8-22 mins                    G Codes:      Christiane Ha, PT, CSCS Pager [phone] Office [phone]  01/13/2015, 12:01 PM

## 2015-01-14 ENCOUNTER — Encounter (HOSPITAL_COMMUNITY): Payer: Self-pay | Admitting: General Practice

## 2015-01-14 DIAGNOSIS — I82622 Acute embolism and thrombosis of deep veins of left upper extremity: Secondary | ICD-10-CM | POA: Diagnosis not present

## 2015-01-14 DIAGNOSIS — D62 Acute posthemorrhagic anemia: Secondary | ICD-10-CM | POA: Diagnosis not present

## 2015-01-14 DIAGNOSIS — R278 Other lack of coordination: Secondary | ICD-10-CM | POA: Diagnosis not present

## 2015-01-14 DIAGNOSIS — M1711 Unilateral primary osteoarthritis, right knee: Secondary | ICD-10-CM | POA: Diagnosis not present

## 2015-01-14 DIAGNOSIS — Z96651 Presence of right artificial knee joint: Secondary | ICD-10-CM | POA: Diagnosis not present

## 2015-01-14 DIAGNOSIS — R6510 Systemic inflammatory response syndrome (SIRS) of non-infectious origin without acute organ dysfunction: Secondary | ICD-10-CM | POA: Diagnosis not present

## 2015-01-14 DIAGNOSIS — M6281 Muscle weakness (generalized): Secondary | ICD-10-CM | POA: Diagnosis not present

## 2015-01-14 DIAGNOSIS — K219 Gastro-esophageal reflux disease without esophagitis: Secondary | ICD-10-CM | POA: Diagnosis not present

## 2015-01-14 DIAGNOSIS — D509 Iron deficiency anemia, unspecified: Secondary | ICD-10-CM | POA: Diagnosis not present

## 2015-01-14 DIAGNOSIS — Z9181 History of falling: Secondary | ICD-10-CM | POA: Diagnosis not present

## 2015-01-14 DIAGNOSIS — G629 Polyneuropathy, unspecified: Secondary | ICD-10-CM | POA: Diagnosis not present

## 2015-01-14 DIAGNOSIS — M1990 Unspecified osteoarthritis, unspecified site: Secondary | ICD-10-CM | POA: Diagnosis not present

## 2015-01-14 DIAGNOSIS — E119 Type 2 diabetes mellitus without complications: Secondary | ICD-10-CM | POA: Diagnosis not present

## 2015-01-14 DIAGNOSIS — I10 Essential (primary) hypertension: Secondary | ICD-10-CM | POA: Diagnosis not present

## 2015-01-14 DIAGNOSIS — Z471 Aftercare following joint replacement surgery: Secondary | ICD-10-CM | POA: Diagnosis not present

## 2015-01-14 DIAGNOSIS — R262 Difficulty in walking, not elsewhere classified: Secondary | ICD-10-CM | POA: Diagnosis not present

## 2015-01-14 LAB — CBC
HEMATOCRIT: 29.1 % — AB (ref 39.0–52.0)
HEMOGLOBIN: 9.2 g/dL — AB (ref 13.0–17.0)
MCH: 26.7 pg (ref 26.0–34.0)
MCHC: 31.6 g/dL (ref 30.0–36.0)
MCV: 84.6 fL (ref 78.0–100.0)
Platelets: 288 10*3/uL (ref 150–400)
RBC: 3.44 MIL/uL — ABNORMAL LOW (ref 4.22–5.81)
RDW: 16.7 % — AB (ref 11.5–15.5)
WBC: 5.3 10*3/uL (ref 4.0–10.5)

## 2015-01-14 LAB — GLUCOSE, CAPILLARY
Glucose-Capillary: 112 mg/dL — ABNORMAL HIGH (ref 65–99)
Glucose-Capillary: 132 mg/dL — ABNORMAL HIGH (ref 65–99)

## 2015-01-14 LAB — BASIC METABOLIC PANEL
ANION GAP: 6 (ref 5–15)
BUN: 7 mg/dL (ref 6–20)
CALCIUM: 8.2 mg/dL — AB (ref 8.9–10.3)
CO2: 28 mmol/L (ref 22–32)
CREATININE: 0.83 mg/dL (ref 0.61–1.24)
Chloride: 103 mmol/L (ref 101–111)
Glucose, Bld: 122 mg/dL — ABNORMAL HIGH (ref 65–99)
Potassium: 3.8 mmol/L (ref 3.5–5.1)
Sodium: 137 mmol/L (ref 135–145)

## 2015-01-14 LAB — BODY FLUID CULTURE: Culture: NO GROWTH

## 2015-01-14 MED ORDER — WARFARIN - PHARMACIST DOSING INPATIENT
Freq: Every day | Status: DC
  Administered 2015-01-14: 18:00:00

## 2015-01-14 MED ORDER — ENOXAPARIN SODIUM 150 MG/ML ~~LOC~~ SOLN: 1.0000 mg/kg | Freq: Two times a day (BID) | SUBCUTANEOUS | Status: DC

## 2015-01-14 MED ORDER — WARFARIN SODIUM 7.5 MG PO TABS
7.5000 mg | ORAL_TABLET | Freq: Every day | ORAL | Status: DC
  Administered 2015-01-14: 7.5 mg via ORAL
  Filled 2015-01-14: qty 1

## 2015-01-14 MED ORDER — COUMADIN BOOK
Freq: Once | Status: AC
Start: 2015-01-14 — End: 2015-01-14
  Administered 2015-01-14: 13:00:00
  Filled 2015-01-14: qty 1

## 2015-01-14 MED ORDER — WARFARIN SODIUM 7.5 MG PO TABS: 7.5000 mg | ORAL_TABLET | Freq: Every day | ORAL | Status: DC

## 2015-01-14 MED ORDER — TRAMADOL HCL 50 MG PO TABS: 50.0000 mg | ORAL_TABLET | Freq: Four times a day (QID) | ORAL | Status: DC | PRN

## 2015-01-14 MED ORDER — ENOXAPARIN SODIUM 150 MG/ML ~~LOC~~ SOLN
1.0000 mg/kg | Freq: Two times a day (BID) | SUBCUTANEOUS | Status: DC
  Administered 2015-01-14: 130 mg via SUBCUTANEOUS
  Filled 2015-01-14 (×2): qty 1

## 2015-01-14 NOTE — Discharge Planning (Signed)
Patient to be discharged to [facility]. Patient's wife updated regarding discharge.  Facility: [facility] RN report number: [phone] Transportation: EMS ([facility])  RN / Licensed conveyancer to call for EMS [employer]) after PICC removed. EMS [facility]): [phone]  Marcelline Deist, [place] - [phone] Clinical Social Work Department Orthopedics [phone]) and Surgical [phone])

## 2015-01-14 NOTE — Progress Notes (Signed)
Physical Therapy Treatment Patient Details Name: Andrew Winters MRN: [id number] DOB: Jun 19, 1941 Today's Date: 01/14/2015    History of Present Illness 74 y.o. male s/p R TKR 6/28. With LUE edema, and found to have acute LUE DVT while in hopsital post surgery.     PT Comments    Pt is s/p TKA resulting in the deficits listed below (see PT Problem List). Pt will continue to benefit from skilled PT to increase their independence and safety with mobility to allow discharge to SNF.    Follow Up Recommendations  SNF     Equipment Recommendations  Rolling walker with 5" wheels    Recommendations for Other Services       Precautions / Restrictions Precautions Precautions: Knee;Fall Required Braces or Orthoses: Knee Immobilizer - Right Restrictions Weight Bearing Restrictions: Yes RLE Weight Bearing: Weight bearing as tolerated    Mobility  Bed Mobility Overal bed mobility: Needs Assistance Bed Mobility: Supine to Sit     Supine to sit: Min guard        Transfers Overall transfer level: Needs assistance Equipment used: Rolling walker (2 wheeled) Transfers: Sit to/from Stand Sit to Stand: Mod assist;From elevated surface            Ambulation/Gait Ambulation/Gait assistance: Min assist Ambulation Distance (Feet): 5 Feet Assistive device: Rolling walker (2 wheeled) Gait Pattern/deviations: Step-to pattern   Gait velocity interpretation: Below normal speed for age/gender     Stairs            Wheelchair Mobility    Modified Rankin (Stroke Patients Only)       Balance Overall balance assessment: Needs assistance Sitting-balance support: No upper extremity supported Sitting balance-Leahy Scale: Good     Standing balance support: Bilateral upper extremity supported Standing balance-Leahy Scale: Poor                      Cognition Arousal/Alertness: Awake/alert Behavior During Therapy: WFL for tasks assessed/performed Overall Cognitive  Status: Within Functional Limits for tasks assessed                      Exercises Total Joint Exercises Quad Sets: Strengthening;Right;10 reps;Other (comment) (recliner) Heel Slides: AAROM;Right;10 reps;Other (comment) (recliner) Straight Leg Raises: Strengthening;Right;10 reps;Other (comment) (moderate assist, in recliner) Goniometric ROM: 5-71     General Comments        Pertinent Vitals/Pain Pain Assessment: 0-10 Pain Score: 5  Pain Location: Rt knee Pain Descriptors / Indicators: Aching Pain Intervention(s): Monitored during session    Home Living Family/patient expects to be discharged to:: Other (Comment) (rehab) Living Arrangements: Spouse/significant other                  Prior Function            PT Goals (current goals can now be found in the care plan section) Acute Rehab PT Goals PT Goal Formulation: With patient Time For Goal Achievement: 01/21/15 Potential to Achieve Goals: Good Progress towards PT goals: Progressing toward goals    Frequency  7X/week    PT Plan Current plan remains appropriate    Co-evaluation             End of Session Equipment Utilized During Treatment: Gait belt Activity Tolerance: Patient limited by fatigue Patient left: in chair;with call bell/phone within reach     Time: 8101-7510 PT Time Calculation (min) (ACUTE ONLY): 30 min  Charges:  $Therapeutic Exercise: 8-22 mins $Therapeutic Activity: 8-22 mins  G Codes:      Christiane Ha, PT, CSCS Pager [phone] Office [phone]  01/14/2015, 12:23 PM

## 2015-01-14 NOTE — Progress Notes (Signed)
Subjective: 8 Days Post-Op Procedure(s) (LRB): TOTAL KNEE ARTHROPLASTY (Right)   Patient appears well this morning. He states that he is ready to get out of the hospital and to a SNF. He feels that he is continuing to get much better.  Activity level:  wbat Diet tolerance:  ok Voiding:  ok Patient reports pain as mild.    Objective: Vital signs in last 24 hours: Temp:  [97.8 F (36.6 C)-98.4 F (36.9 C)] 97.8 F (36.6 C) (07/06 0540) Pulse Rate:  [67-82] 67 (07/06 0540) Resp:  [16-18] 16 (07/06 0540) BP: (115-133)/(54-78) 126/63 mmHg (07/06 0540) SpO2:  [97 %-100 %] 100 % (07/06 0540)  Labs:  Recent Labs  01/12/15 0439 01/14/15 0609  HGB 9.2* 9.2*    Recent Labs  01/12/15 0439 01/14/15 0609  WBC 5.1 5.3  RBC 3.31* 3.44*  HCT 28.0* 29.1*  PLT 205 288    Recent Labs  01/13/15 0425 01/14/15 0609  NA 137 137  K 3.4* 3.8  CL 101 103  CO2 29 28  BUN 9 7  CREATININE 0.82 0.83  GLUCOSE 123* 122*  CALCIUM 7.9* 8.2*   No results for input(s): LABPT, INR in the last 72 hours.  Physical Exam:  Neurologically intact ABD soft Neurovascular intact Sensation intact distally Intact pulses distally Dorsiflexion/Plantar flexion intact Incision: dressing C/D/I and no drainage No cellulitis present Compartment soft  Assessment/Plan:  8 Days Post-Op Procedure(s) (LRB): TOTAL KNEE ARTHROPLASTY (Right) Advance diet Up with therapy Discharge to SNF today if continuing to do well. Continue on eliquis for treatment of LUE DVT for 3-6 months based on PCP's recommendation. Indocin for 3 days for treatment of pseudogout per medical team. We greatly appreciate medical and ID teams management and help. Follow up in office 1 week.   Chloeanne Poteet, Ginger Organ 01/14/2015, 7:21 AM

## 2015-01-14 NOTE — Progress Notes (Signed)
Report called to [facility]'s evening supervisor. Told her that the patient will be started on Coumadin and Lovenox. MD wanted to make sure that his INRs will be monitored and facility agreed to closely monitor patient. [facility] notified and his here now to transport patient.

## 2015-01-14 NOTE — Care Management (Signed)
Case manager contacted patient's primary care physician's office- [facility] Dr. Michel Sant[place], [place], [place] [phone].  They will monitor his coumadin after discharge from [facility]. Vance Peper, RN BSN Case manager

## 2015-01-14 NOTE — Progress Notes (Signed)
Wished patient and his wife well. On a stretcher, [facility] repairing to take him to SNF. Wife is collecting his belongings. Denies pain.

## 2015-01-14 NOTE — Discharge Instructions (Addendum)

## 2015-01-14 NOTE — Care Management Note (Signed)
Case Management Note  Patient Details  Name: Andrew Winters MRN: [id number] Date of Birth: Oct 18, 1940  Subjective/Objective:                    Action/Plan: Case manager contacted Greig Castilla, Dr. Bobbye Charleston PA, concerning co-pay for Eloquis. Patient wouldhave co-pay of $300.00. Patient will discharge on lovenox and coumadin.   Expected Discharge Date:   01/14/15               Expected Discharge Plan:   Skilled Nursing Facility  In-House Referral:     Discharge planning Services     Post Acute Care Choice:    Choice offered to:     DME Arranged: NA   DME Agency:     HH Arranged: NA   HH Agency:     Status of Service:   Completed  Medicare Important Message Given:  Yes-third notification given Date Medicare IM Given:    Medicare IM give by:    Date Additional Medicare IM Given:    Additional Medicare Important Message give by:     If discussed at Long Length of Stay Meetings, dates discussed:    Additional Comments:  Durenda Guthrie, RN 01/14/2015, 11:33 AM

## 2015-01-14 NOTE — Discharge Summary (Signed)
Patient ID: Andrew Winters MRN: [id number] DOB/AGE: 1940/12/24 74 y.o.  Admit date: 01/06/2015 Discharge date: 01/14/2015  Admission Diagnoses:  Principal Problem:   Primary osteoarthritis of right knee Active Problems:   GERD   Diabetes   Morbid obesity   Diabetes mellitus type 2, controlled   Essential hypertension   SIRS (systemic inflammatory response syndrome)   Acute encephalopathy   Fever   Postoperative fever   DVT of upper extremity (deep vein thrombosis): LEFT   Anemia, iron deficiency   Pseudogout of right knee   Discharge Diagnoses:  Same  Past Medical History  Diagnosis Date  . Bilateral swelling of feet   . Headaches, cluster   . Anxiety   . Fatigue   . Rheumatoid arthritis(714.0)   . Muscle pain   . Hiatal hernia   . Vitamin D deficiency     takes Vit D daily  . BPH (benign prostatic hyperplasia)     no meds needed per medical md  . Osteoarthritis   . Hyperthyroidism   . Deafness     in left ear   . Hyperlipemia     was on meds but hasnt been on anything in about 3months per MD  . Presbyesophagus   . Insomnia     takes Trazodone nightly  . Depression     takes Zoloft daily  . Dysphagia   . Hypertension     takes Amlodipine,HCTZ,and Lisinopril daily  . Pneumonia 3+ yrs ago  . Peripheral neuropathy     takes Gabapentin daily  . Joint pain   . Joint swelling   . GERD (gastroesophageal reflux disease)     takes Omeprazole daily  . Chronic diarrhea   . Nocturia   . Diabetes mellitus     takes Actos and Amaryl daily as well as Onglyza  . Cataracts, bilateral     immature    Surgeries: Procedure(s): TOTAL KNEE ARTHROPLASTY on 01/06/2015   Consultants:    Discharged Condition: Improved  Hospital Course: Andrew Winters is an 74 y.o. male who was admitted 01/06/2015 for operative treatment ofPrimary osteoarthritis of right knee. Patient has severe unremitting pain that affects sleep, daily activities, and work/hobbies. After pre-op  clearance the patient was taken to the operating room on 01/06/2015 and underwent  Procedure(s): TOTAL KNEE ARTHROPLASTY.    Patient was given perioperative antibiotics: Anti-infectives    Start     Dose/Rate Route Frequency Ordered Stop   01/10/15 1400  vancomycin (VANCOCIN) IVPB 1000 mg/200 mL premix  Status:  Discontinued     1,000 mg 200 mL/hr over 60 Minutes Intravenous Every 8 hours 01/10/15 1358 01/12/15 1413   01/08/15 1200  vancomycin (VANCOCIN) IVPB 1000 mg/200 mL premix  Status:  Discontinued     1,000 mg 200 mL/hr over 60 Minutes Intravenous Every 12 hours 01/07/15 2326 01/10/15 1358   01/08/15 0700  piperacillin-tazobactam (ZOSYN) IVPB 3.375 g  Status:  Discontinued     3.375 g 12.5 mL/hr over 240 Minutes Intravenous 3 times per day 01/07/15 2326 01/12/15 1413   01/07/15 2330  vancomycin (VANCOCIN) 2,000 mg in sodium chloride 0.9 % 500 mL IVPB     2,000 mg 250 mL/hr over 120 Minutes Intravenous STAT 01/07/15 2326 01/08/15 0306   01/07/15 2300  piperacillin-tazobactam (ZOSYN) IVPB 3.375 g     3.375 g 100 mL/hr over 30 Minutes Intravenous  Once 01/07/15 2244 01/08/15 0016   01/07/15 2245  vancomycin (VANCOCIN) IVPB 1000 mg/200 mL premix  Status:  Discontinued  1,000 mg 200 mL/hr over 60 Minutes Intravenous  Once 01/07/15 2244 01/07/15 2254   01/06/15 1630  ceFAZolin (ANCEF) IVPB 2 g/50 mL premix     2 g 100 mL/hr over 30 Minutes Intravenous Every 6 hours 01/06/15 1606 01/06/15 2250   01/06/15 0830  ceFAZolin (ANCEF) IVPB 2 g/50 mL premix     2 g 100 mL/hr over 30 Minutes Intravenous To ShortStay [facility] 01/05/15 1347 01/06/15 0955       Patient was given sequential compression devices, early ambulation, and chemoprophylaxis to prevent DVT.  Patient had high fevers and some confusion after surgery. He was found to have a Left upper extremity DVT along with pseudogout in his replaced knee. He was place on Lovenox bridge to coumadin for his LUE DVT and indocin for his  knee. He responded well to this treatment.   He will need to be set up with long term management of coumadin for his LUE DVT.  Patient benefited maximally from hospital stay and there were no complications.    Recent vital signs: Patient Vitals for the past 24 hrs:  BP Temp Temp src Pulse Resp SpO2  01/14/15 0540 126/63 mmHg 97.8 F (36.6 C) Oral 67 16 100 %  01/13/15 2036 (!) 115/54 mmHg 98.4 F (36.9 C) Oral 74 16 97 %  01/13/15 1500 133/78 mmHg 98.1 F (36.7 C) Oral 82 18 99 %     Recent laboratory studies:  Recent Labs  01/12/15 0439 01/13/15 0425 01/14/15 0609  WBC 5.1  --  5.3  HGB 9.2*  --  9.2*  HCT 28.0*  --  29.1*  PLT 205  --  288  NA 136 137 137  K 3.5 3.4* 3.8  CL 100* 101 103  CO2 28 29 28   BUN 10 9 7   CREATININE 0.82 0.82 0.83  GLUCOSE 139* 123* 122*  CALCIUM 7.9* 7.9* 8.2*     Discharge Medications:     Medication List    STOP taking these medications        hydrochlorothiazide 12.5 MG capsule  Commonly known as:  MICROZIDE     hydrochlorothiazide 25 MG tablet  Commonly known as:  HYDRODIURIL     metFORMIN 1000 MG tablet  Commonly known as:  GLUCOPHAGE     potassium chloride 10 MEQ tablet  Commonly known as:  K-DUR      TAKE these medications        acetaminophen 500 MG tablet  Commonly known as:  TYLENOL  Take 500 mg by mouth every 6 (six) hours as needed for mild pain or moderate pain.     amLODipine 5 MG tablet  Commonly known as:  NORVASC  Take 5 mg by mouth daily.     apixaban 5 MG Tabs tablet  Commonly known as:  ELIQUIS  Take 2 tablets (10 mg total) by mouth 2 (two) times daily. Take for 7 days, then 5 mg bid.     apixaban 5 MG Tabs tablet  Commonly known as:  ELIQUIS  Take 1 tablet (5 mg total) by mouth 2 (two) times daily.  Start taking on:  01/20/2015     docusate sodium 100 MG capsule  Commonly known as:  COLACE  Take 1 capsule (100 mg total) by mouth 2 (two) times daily.     donepezil 5 MG tablet  Commonly  known as:  ARICEPT  Take 5 mg by mouth at bedtime.     ferrous sulfate 325 (65 FE) MG  tablet  Take 1 tablet (325 mg total) by mouth 3 (three) times daily with meals.     fluticasone 50 MCG/ACT nasal spray  Commonly known as:  FLONASE  Place 1 spray into both nostrils daily.     gabapentin 100 MG capsule  Commonly known as:  NEURONTIN  Take 100 mg by mouth 3 (three) times daily.     glimepiride 4 MG tablet  Commonly known as:  AMARYL  Take 4 mg by mouth daily before breakfast.     indomethacin 75 MG CR capsule  Commonly known as:  INDOCIN SR  Take 1 capsule (75 mg total) by mouth 2 (two) times daily with a meal. Take for 3 days then as needed.     lisinopril 2.5 MG tablet  Commonly known as:  PRINIVIL,ZESTRIL  Take 2.5 mg by mouth 2 (two) times daily.     meloxicam 15 MG tablet  Commonly known as:  MOBIC  Take 1 tablet (15 mg total) by mouth daily.  Start taking on:  01/17/2015     Omeprazole 20 MG Tbec  Take 20 mg by mouth daily at 12 noon.     ONGLYZA 2.5 MG Tabs tablet  Generic drug:  saxagliptin HCl  Take 2.5 mg by mouth daily.     pioglitazone 15 MG tablet  Commonly known as:  ACTOS  Take 15 mg by mouth daily.     sertraline 25 MG tablet  Commonly known as:  ZOLOFT  Take 25 mg by mouth daily.     traMADol 50 MG tablet  Commonly known as:  ULTRAM  Take 1 tablet (50 mg total) by mouth every 6 (six) hours as needed for moderate pain or severe pain.     traZODone 50 MG tablet  Commonly known as:  DESYREL  Take 50 mg by mouth at bedtime.     Vitamin D 1000 UNITS capsule  Take 1,000 Units by mouth 2 (two) times daily.        Diagnostic Studies: Dg Chest 1 View  01/07/2015   CLINICAL DATA:  Disoriented.  Wheezing.  EXAM: CHEST  1 VIEW  COMPARISON:  12/25/2014  FINDINGS: There is mild unchanged cardiomegaly. The lungs are grossly clear. There is no large effusion. There is no pneumothorax.  IMPRESSION: No acute cardiopulmonary findings.   Electronically Signed    By: Ellery Plunk M.D.   On: 01/07/2015 21:32   Dg Chest 2 View  12/25/2014   CLINICAL DATA:  Hypertension, preop for right total knee arthroplasty.  EXAM: CHEST  2 VIEW  COMPARISON:  July 03, 2011.  FINDINGS: The heart size and mediastinal contours are within normal limits. Both lungs are clear. No pneumothorax or pleural effusion is noted. Multilevel degenerative disc disease is noted in lower thoracic spine.  IMPRESSION: No active cardiopulmonary disease.   Electronically Signed   By: Lupita Raider, M.D.   On: 12/25/2014 16:37   Ct Head Wo Contrast  01/08/2015   CLINICAL DATA:  Acute encephalopathy.  EXAM: CT HEAD WITHOUT CONTRAST  TECHNIQUE: Contiguous axial images were obtained from the base of the skull through the vertex without intravenous contrast.  COMPARISON:  07/03/2011.  FINDINGS: No evidence of an acute infarct, acute hemorrhage, mass lesion, mass effect or hydrocephalus. Atrophy. Minimal periventricular low attenuation. No air-fluid levels in the paranasal sinuses or mastoid air cells.  IMPRESSION: 1. No acute intracranial abnormality. 2. Atrophy and mild chronic microvascular white matter ischemic changes.   Electronically Signed  By: Leanna Battles M.D.   On: 01/08/2015 08:29   Ct Angio Chest Pe W/cm &/or Wo Cm  01/08/2015   CLINICAL DATA:  Recent right knee replacement with chest pain and shortness of Breath  EXAM: CT ANGIOGRAPHY CHEST WITH CONTRAST  TECHNIQUE: Multidetector CT imaging of the chest was performed using the standard protocol during bolus administration of intravenous contrast. Multiplanar CT image reconstructions and MIPs were obtained to evaluate the vascular anatomy.  CONTRAST:  OMNIPAQUE IOHEXOL 350 MG/ML SOLN  COMPARISON:  None.  FINDINGS: The lungs are well aerated bilaterally. Very minimal dependent atelectatic changes are seen. No focal infiltrate or sizable effusion is noted. The thoracic inlet is within normal limits. The thoracic aorta and its  branches are unremarkable. The pulmonary artery shows a normal branching pattern. No definitive pulmonary embolism is identified. Opacification of the artery is somewhat decreased. No evidence of right heart strain is seen. Calcified hilar and mediastinal lymph nodes are noted consistent with prior granulomatous disease.  The visualized upper abdomen shows no acute abnormality. The osseous structures show degenerative change of thoracic spine.  Review of the MIP images confirms the above findings.  IMPRESSION: No focal infiltrate noted.  No definitive pulmonary embolism identified although opacification of the pulmonary artery is somewhat decreased. This may be related patient's body habitus.   Electronically Signed   By: Alcide Clever M.D.   On: 01/08/2015 20:38   Dg Chest Port 1 View  01/09/2015   CLINICAL DATA:  Fever.  Recent total knee replacement  EXAM: PORTABLE CHEST - 1 VIEW  COMPARISON:  Chest radiograph January 07, 2015; chest CT January 08, 2015  FINDINGS: There is no edema or consolidation. Heart is upper normal in size with pulmonary vascularity within normal limits. No adenopathy. There is degenerative change in the thoracic spine.  IMPRESSION: No edema or consolidation.   Electronically Signed   By: Bretta Bang III M.D.   On: 01/09/2015 08:08   Dg Knee Complete 4 Views Right  01/09/2015   CLINICAL DATA:  Right knee surgery on 06/28, postop fever  EXAM: RIGHT KNEE - COMPLETE 4+ VIEW  COMPARISON:  Right knee radiographs dated 07/06/2011  FINDINGS: Right knee arthroplasty.  No evidence of hardware fracture or loosening.  No fracture or dislocation is seen.  Mild soft tissue swelling.  IMPRESSION: Right knee arthroplasty without evidence of complication.   Electronically Signed   By: Charline Bills M.D.   On: 01/09/2015 16:29    Disposition: Final discharge disposition not confirmed      Discharge Instructions    Call MD / Call 911    Complete by:  As directed   If you experience chest pain  or shortness of breath, CALL 911 and be transported to the hospital emergency room.  If you develope a fever above 101 F, pus (white drainage) or increased drainage or redness at the wound, or calf pain, call your surgeon's office.     Constipation Prevention    Complete by:  As directed   Drink plenty of fluids.  Prune juice may be helpful.  You may use a stool softener, such as Colace (over the counter) 100 mg twice a day.  Use MiraLax (over the counter) for constipation as needed.     Diet - low sodium heart healthy    Complete by:  As directed      Discharge instructions    Complete by:  As directed   INSTRUCTIONS AFTER JOINT REPLACEMENT  Remove items at home which could result in a fall. This includes throw rugs or furniture in walking pathways ICE to the affected joint every three hours while awake for 30 minutes at a time, for at least the first 3-5 days, and then as needed for pain and swelling.  Continue to use ice for pain and swelling. You may notice swelling that will progress down to the foot and ankle.  This is normal after surgery.  Elevate your leg when you are not up walking on it.   Continue to use the breathing machine you got in the hospital (incentive spirometer) which will help keep your temperature down.  It is common for your temperature to cycle up and down following surgery, especially at night when you are not up moving around and exerting yourself.  The breathing machine keeps your lungs expanded and your temperature down.   DIET:  As you were doing prior to hospitalization, we recommend a well-balanced diet.  DRESSING / WOUND CARE / SHOWERING  You may shower 3 days after surgery, but keep the wounds dry during showering.  You may use an occlusive plastic wrap (Press'n Seal for example), NO SOAKING/SUBMERGING IN THE BATHTUB.  If the bandage gets wet, change with a clean dry gauze.  If the incision gets wet, pat the wound dry with a clean towel.  ACTIVITY  Increase  activity slowly as tolerated, but follow the weight bearing instructions below.   No driving for 6 weeks or until further direction given by your physician.  You cannot drive while taking narcotics.  No lifting or carrying greater than 10 lbs. until further directed by your surgeon. Avoid periods of inactivity such as sitting longer than an hour when not asleep. This helps prevent blood clots.  You may return to work once you are authorized by your doctor.     WEIGHT BEARING   Weight bearing as tolerated with assist device (walker, cane, etc) as directed, use it as long as suggested by your surgeon or therapist, typically at least 4-6 weeks.   EXERCISES  Results after joint replacement surgery are often greatly improved when you follow the exercise, range of motion and muscle strengthening exercises prescribed by your doctor. Safety measures are also important to protect the joint from further injury. Any time any of these exercises cause you to have increased pain or swelling, decrease what you are doing until you are comfortable again and then slowly increase them. If you have problems or questions, call your caregiver or physical therapist for advice.   Rehabilitation is important following a joint replacement. After just a few days of immobilization, the muscles of the leg can become weakened and shrink (atrophy).  These exercises are designed to build up the tone and strength of the thigh and leg muscles and to improve motion. Often times heat used for twenty to thirty minutes before working out will loosen up your tissues and help with improving the range of motion but do not use heat for the first two weeks following surgery (sometimes heat can increase post-operative swelling).   These exercises can be done on a training (exercise) mat, on the floor, on a table or on a bed. Use whatever works the best and is most comfortable for you.    Use music or television while you are exercising so  that the exercises are a pleasant break in your day. This will make your life better with the exercises acting as a break in your  routine that you can look forward to.   Perform all exercises about fifteen times, three times per day or as directed.  You should exercise both the operative leg and the other leg as well.   Exercises include:   Quad Sets - Tighten up the muscle on the front of the thigh (Quad) and hold for 5-10 seconds.   Straight Leg Raises - With your knee straight (if you were given a brace, keep it on), lift the leg to 60 degrees, hold for 3 seconds, and slowly lower the leg.  Perform this exercise against resistance later as your leg gets stronger.  Leg Slides: Lying on your back, slowly slide your foot toward your buttocks, bending your knee up off the floor (only go as far as is comfortable). Then slowly slide your foot back down until your leg is flat on the floor again.  Angel Wings: Lying on your back spread your legs to the side as far apart as you can without causing discomfort.  Hamstring Strength:  Lying on your back, push your heel against the floor with your leg straight by tightening up the muscles of your buttocks.  Repeat, but this time bend your knee to a comfortable angle, and push your heel against the floor.  You may put a pillow under the heel to make it more comfortable if necessary.   A rehabilitation program following joint replacement surgery can speed recovery and prevent re-injury in the future due to weakened muscles. Contact your doctor or a physical therapist for more information on knee rehabilitation.    CONSTIPATION  Constipation is defined medically as fewer than three stools per week and severe constipation as less than one stool per week.  Even if you have a regular bowel pattern at home, your normal regimen is likely to be disrupted due to multiple reasons following surgery.  Combination of anesthesia, postoperative narcotics, change in appetite and  fluid intake all can affect your bowels.   YOU MUST use at least one of the following options; they are listed in order of increasing strength to get the job done.  They are all available over the counter, and you may need to use some, POSSIBLY even all of these options:    Drink plenty of fluids (prune juice may be helpful) and high fiber foods Colace 100 mg by mouth twice a day  Senokot for constipation as directed and as needed Dulcolax (bisacodyl), take with full glass of water  Miralax (polyethylene glycol) once or twice a day as needed.  If you have tried all these things and are unable to have a bowel movement in the first 3-4 days after surgery call either your surgeon or your primary doctor.    If you experience loose stools or diarrhea, hold the medications until you stool forms back up.  If your symptoms do not get better within 1 week or if they get worse, check with your doctor.  If you experience "the worst abdominal pain ever" or develop nausea or vomiting, please contact the office immediately for further recommendations for treatment.   ITCHING:  If you experience itching with your medications, try taking only a single pain pill, or even half a pain pill at a time.  You can also use Benadryl over the counter for itching or also to help with sleep.   TED HOSE STOCKINGS:  Use stockings on both legs until for at least 2 weeks or as directed by physician office. They may be  removed at night for sleeping.  MEDICATIONS:  See your medication summary on the "After Visit Summary" that nursing will review with you.  You may have some home medications which will be placed on hold until you complete the course of blood thinner medication.  It is important for you to complete the blood thinner medication as prescribed.  PRECAUTIONS:  If you experience chest pain or shortness of breath - call 911 immediately for transfer to the hospital emergency department.   If you develop a fever greater  that 101 F, purulent drainage from wound, increased redness or drainage from wound, foul odor from the wound/dressing, or calf pain - CONTACT YOUR SURGEON.                                                   FOLLOW-UP APPOINTMENTS:  If you do not already have a post-op appointment, please call the office for an appointment to be seen by your surgeon.  Guidelines for how soon to be seen are listed in your "After Visit Summary", but are typically between 1-4 weeks after surgery.  OTHER INSTRUCTIONS:   Knee Replacement:  Do not place pillow under knee, focus on keeping the knee straight while resting. CPM instructions: 0-90 degrees, 2 hours in the morning, 2 hours in the afternoon, and 2 hours in the evening. Place foam block, curve side up under heel at all times except when in CPM or when walking.  DO NOT modify, tear, cut, or change the foam block in any way.  MAKE SURE YOU:  Understand these instructions.  Get help right away if you are not doing well or get worse.    Thank you for letting us be a part of your medical care team.  It is a privilege we respect greatly.  We hope these instructions will help you stay on track for a fast and full recovery!     Increase activity slowly as tolerated    Complete by:  As directed           Due to prior authorization requirements and cost, patient is to switch back to Lovenox and warfarin overlap for DVT treatment.  Plan for LUE DVT: -Lovenox  subQ q12h x5 days at least. Cannot be discontinued until INR is therapeutic for 24h -Warfarin 7.5mg  daily with an INR check on Sunday 7/10 (D#5 of overlap) if able. If not, can wait until Monday 7/11.   If there are any questions please contact Lauren D. Bajbus, PharmD, BCPS Clinical Pharmacist [phone]    Follow-up Information    Follow up with DALLDORF,PETER G, MD. Schedule an appointment as soon as possible for a visit in 2 weeks.   Specialty:  Orthopedic Surgery   Contact  information:   [address] [place] [address] [phone]        Signed: Drema Halon 01/14/2015, 7:30 AM

## 2015-01-14 NOTE — Progress Notes (Signed)
Due to prior authorization requirements and cost, patient is to switch back to Lovenox and warfarin overlap for DVT treatment.  Patient never received a dose of warfarin yesterday, therefore today is D#1/5 required overlap for treatment.   INR was not drawn this morning as that lab was cancelled since patient was on Eliquis yesterday.  Plan: -Lovenox 130mg  subQ q12h x5 days at least. Cannot be discontinued until INR is therapeutic for 24h -warfarin 7.5mg  daily with an INR check on Sunday 7/10 (D#5 of overlap) if able. If not, can wait until Monday 7/11.   Emerly Prak D. Cathlyn Tersigni, PharmD, BCPS Clinical Pharmacist Pager: [phone] 01/14/2015 11:40 AM

## 2015-01-15 ENCOUNTER — Other Ambulatory Visit: Payer: Self-pay | Admitting: *Deleted

## 2015-01-15 ENCOUNTER — Non-Acute Institutional Stay (SKILLED_NURSING_FACILITY): Payer: Medicare Other | Admitting: Internal Medicine

## 2015-01-15 DIAGNOSIS — E1140 Type 2 diabetes mellitus with diabetic neuropathy, unspecified: Secondary | ICD-10-CM | POA: Diagnosis not present

## 2015-01-15 DIAGNOSIS — R21 Rash and other nonspecific skin eruption: Secondary | ICD-10-CM

## 2015-01-15 DIAGNOSIS — I82622 Acute embolism and thrombosis of deep veins of left upper extremity: Secondary | ICD-10-CM

## 2015-01-15 DIAGNOSIS — D62 Acute posthemorrhagic anemia: Secondary | ICD-10-CM

## 2015-01-15 DIAGNOSIS — I10 Essential (primary) hypertension: Secondary | ICD-10-CM | POA: Diagnosis not present

## 2015-01-15 DIAGNOSIS — K219 Gastro-esophageal reflux disease without esophagitis: Secondary | ICD-10-CM

## 2015-01-15 DIAGNOSIS — M1711 Unilateral primary osteoarthritis, right knee: Secondary | ICD-10-CM

## 2015-01-15 MED ORDER — TRAMADOL HCL 50 MG PO TABS: 50.0000 mg | ORAL_TABLET | Freq: Four times a day (QID) | ORAL | Status: DC | PRN

## 2015-01-16 LAB — ANAEROBIC CULTURE

## 2015-01-16 NOTE — Progress Notes (Signed)
Patient ID: Andrew Winters, male   DOB: 30-Jan-1941, 74 y.o.   MRN: [id number]      Facility: [facility]    PCP: Crist Fat, MD  Code Status: full code  Allergies  Allergen Reactions  . Aspirin Diarrhea    Chief Complaint  Patient presents with  . New Admit To SNF     HPI:  74 y.o. year old patient is here for short term rehabilitation post hospital admission from 01/06/15-01/14/15 with primary OA of right knee. He underwent right total knee arthroplasty. He developed Left upper extremity DVT and is now on lovenox with coumadin. He has generalized rash for 2-3 days. It is associated with itching. His pain is under control. He has PMH of gerd, dm, obesity, dm type 2, HTN.  Review of Systems:  Constitutional: Negative for fever, chills, malaise/fatigue and diaphoresis.  HENT: Negative for headache, congestion, nasal discharge, difficulty swallowing, lip and tongue swelling Eyes: Negative for eye pain and discharge.  Respiratory: Negative for cough, shortness of breath and wheezing.   Cardiovascular: Negative for chest pain, palpitations, leg swelling.  Gastrointestinal: Negative for heartburn, nausea, vomiting, abdominal pain. Genitourinary: Negative for dysuria.  Musculoskeletal: Negative for back pain, falls. Skin: see HPI Neurological: Negative for dizziness, tingling, focal weakness Psychiatric/Behavioral: Negative for depression    Past Medical History  Diagnosis Date  . Bilateral swelling of feet   . Headaches, cluster   . Anxiety   . Fatigue   . Rheumatoid arthritis(714.0)   . Muscle pain   . Hiatal hernia   . Vitamin D deficiency     takes Vit D daily  . BPH (benign prostatic hyperplasia)     no meds needed per medical md  . Osteoarthritis   . Hyperthyroidism   . Deafness     in left ear   . Hyperlipemia     was on meds but hasnt been on anything in about 58months per MD  . Presbyesophagus   . Insomnia     takes Trazodone  nightly  . Depression     takes Zoloft daily  . Dysphagia   . Hypertension     takes Amlodipine,HCTZ,and Lisinopril daily  . Pneumonia 3+ yrs ago  . Peripheral neuropathy     takes Gabapentin daily  . Joint pain   . Joint swelling   . GERD (gastroesophageal reflux disease)     takes Omeprazole daily  . Chronic diarrhea   . Nocturia   . Diabetes mellitus     takes Actos and Amaryl daily as well as Onglyza  . Cataracts, bilateral     immature   Past Surgical History  Procedure Laterality Date  . Back surgery  1998  . Colonoscopy with esophagogastroduodenoscopy (egd) and esophageal dilation (ed)    . Total knee arthroplasty Right 01/06/2015    Procedure: TOTAL KNEE ARTHROPLASTY;  Surgeon: Marcene Corning, MD;  Location: [facility] OR;  Service: Orthopedics;  Laterality: Right;  . Irrigation and debridement knee Right 01/12/2015   Social History:   reports that he quit smoking about 3 months ago. His smoking use included Cigarettes. He has a 35 pack-year smoking history. He has never used smokeless tobacco. He reports that he does not drink alcohol or use illicit drugs.  Family History  Problem Relation Age of Onset  . Heart disease Mother   . Diabetes Mother   . Heart attack Father   . Colon cancer Neg Hx   . Diabetes Brother  Medications: Patient's Medications  New Prescriptions   No medications on file  Previous Medications   ACETAMINOPHEN (TYLENOL) 500 MG TABLET    Take 500 mg by mouth every 6 (six) hours as needed for mild pain or moderate pain.   AMLODIPINE (NORVASC) 5 MG TABLET    Take 5 mg by mouth daily.     CHOLECALCIFEROL (VITAMIN D) 1000 UNITS CAPSULE    Take 1,000 Units by mouth 2 (two) times daily.     DOCUSATE SODIUM (COLACE) 100 MG CAPSULE    Take 1 capsule (100 mg total) by mouth 2 (two) times daily.   DONEPEZIL (ARICEPT) 5 MG TABLET    Take 5 mg by mouth at bedtime.    ENOXAPARIN (LOVENOX) 150 MG/ML INJECTION    Inject 0.85 mLs (130 mg total) into the skin  every 12 (twelve) hours.   FERROUS SULFATE 325 (65 FE) MG TABLET    Take 1 tablet (325 mg total) by mouth 3 (three) times daily with meals.   FLUTICASONE (FLONASE) 50 MCG/ACT NASAL SPRAY    Place 1 spray into both nostrils daily.   GABAPENTIN (NEURONTIN) 100 MG CAPSULE    Take 100 mg by mouth 3 (three) times daily.     GLIMEPIRIDE (AMARYL) 4 MG TABLET    Take 4 mg by mouth daily before breakfast.     INDOMETHACIN (INDOCIN SR) 75 MG CR CAPSULE    Take 1 capsule (75 mg total) by mouth 2 (two) times daily with a meal. Take for 3 days then as needed.   LISINOPRIL (PRINIVIL,ZESTRIL) 2.5 MG TABLET    Take 2.5 mg by mouth 2 (two) times daily.    OMEPRAZOLE 20 MG TBEC    Take 20 mg by mouth daily at 12 noon.    ONGLYZA 2.5 MG TABS TABLET    Take 2.5 mg by mouth daily.    PIOGLITAZONE (ACTOS) 15 MG TABLET    Take 15 mg by mouth daily.    SERTRALINE (ZOLOFT) 25 MG TABLET    Take 25 mg by mouth daily.    TRAMADOL (ULTRAM) 50 MG TABLET    Take 1 tablet (50 mg total) by mouth every 6 (six) hours as needed for moderate pain or severe pain.   TRAZODONE (DESYREL) 50 MG TABLET    Take 50 mg by mouth at bedtime.   WARFARIN (COUMADIN) 7.5 MG TABLET    Take 1 tablet (7.5 mg total) by mouth daily at 6 PM.  Modified Medications   No medications on file  Discontinued Medications   No medications on file     Physical Exam: Filed Vitals:   01/16/15 1655  BP: 127/77  Pulse: 74  Temp: 98.6 F (37 C)  Resp: 18  SpO2: 97%    General- elderly male, in no acute distress Head- normocephalic, atraumatic Throat- moist mucus membrane, normal oropharynx Eyes- PERRLA, EOMI, no pallor, no icterus, no discharge, normal conjunctiva, normal sclera Neck- no cervical lymphadenopathy, no jugular vein distension Cardiovascular- normal s1,s2, no murmurs, palpable dorsalis pedis and radial pulses, right leg edema Respiratory- bilateral clear to auscultation, no wheeze, no rhonchi, no crackles, no use of accessory  muscles Abdomen- bowel sounds present, soft, non tender Musculoskeletal- able to move all 4 extremities, limited right knee range of motion, left upper extremity swelling Neurological- no focal deficit, alert and oriented  Skin- warm and dry, right knee aquacel dressing, erythematous generalized rash Psychiatry- normal mood and affect    Labs reviewed: Basic Metabolic Panel:  Recent Labs  01/12/15 0439 01/13/15 0425 01/14/15 0609  NA 136 137 137  K 3.5 3.4* 3.8  CL 100* 101 103  CO2 28 29 28   GLUCOSE 139* 123* 122*  BUN 10 9 7   CREATININE 0.82 0.82 0.83  CALCIUM 7.9* 7.9* 8.2*   Liver Function Tests:  Recent Labs  01/08/15 0008  AST 27  ALT 12*  ALKPHOS 53  BILITOT 0.5  PROT 5.9*  ALBUMIN 3.0*   No results for input(s): LIPASE, AMYLASE in the last 8760 hours. No results for input(s): AMMONIA in the last 8760 hours. CBC:  Recent Labs  12/25/14 1404  01/08/15 0008  01/10/15 0433 01/11/15 0641 01/12/15 0439 01/14/15 0609  WBC 6.4  < > 7.1  < > 6.3 5.8 5.1 5.3  NEUTROABS 3.8  --  5.1  --  4.4  --   --   --   HGB 13.3  < > 10.3*  < > 8.9* 9.4* 9.2* 9.2*  HCT 41.0  < > 32.0*  < > 27.5* 29.1* 28.0* 29.1*  MCV 84.5  < > 84.7  < > 83.6 83.6 84.6 84.6  PLT 214  < > 144*  < > 151 187 205 288  < > = values in this interval not displayed. Cardiac Enzymes: No results for input(s): CKTOTAL, CKMB, CKMBINDEX, TROPONINI in the last 8760 hours. BNP: Invalid input(s): POCBNP CBG:  Recent Labs  01/13/15 2123 01/14/15 0539 01/14/15 1215  GLUCAP 112* 112* 132*    Radiological Exams: Dg Chest 2 View  12/25/2014   CLINICAL DATA:  Hypertension, preop for right total knee arthroplasty.  EXAM: CHEST  2 VIEW  COMPARISON:  July 03, 2011.  FINDINGS: The heart size and mediastinal contours are within normal limits. Both lungs are clear. No pneumothorax or pleural effusion is noted. Multilevel degenerative disc disease is noted in lower thoracic spine.  IMPRESSION: No  active cardiopulmonary disease.   Electronically Signed   By: 12/27/2014, M.D.   On: 12/25/2014 16:37    Assessment/Plan  Right knee OA S/p right total knee arthroplasty. Will have him work with physical therapy and occupational therapy team to help with gait training and muscle strengthening exercises.fall precautions. Skin care. Encourage to be out of bed. Has follow up with orthopedics. Continue meloxicam 15 mg daily and prn tramadol with indomethacin and tylenol prn for pain. Continue vitamin d supplement. Continue coumadin for dvt prophylaxis.  Left upper extremity DVT inr 1.3 today. D/c eliquis for insurance reason. Continue lovenox 130 mg bid with coumadin 7.5 mg daily and check inr 01/19/15.   Generalized rash No identified agent causing the rash at present. Start prednisone 50 mg po daily x 5 days and benadryl 25 mg qhs x 3 days then prn and reassess  HTN Stable bp, continue amlodipine 5 mg daily and lisinopril 2.5 mg daily, monitor bp  Constipation Stable, continue colace 100 mg bid  Dementia Stable, continue aricept 5 mg daily  Blood loss anemia Post op, monitor h&h, continue ferrous sulfate 325 mg tid  Dm with neuropathy Monitor cbg, continue amaryl 4 mg daily, onglyza 2.5 mg daily, pioglitazone 15 mg daily with neurontin 100 mg tid and monitor  gerd Stable, continue omeprazole 20 mg daily   Goals of care: short term rehabilitation   Labs/tests ordered: cbc cmp  Family/ staff Communication: reviewed care plan with patient and nursing supervisor    12/27/2014, MD  [facility] [phone] (Monday-Friday 8 am - 5  pm) [phone] (afterhours)

## 2015-01-17 LAB — CULTURE, BODY FLUID W GRAM STAIN -BOTTLE

## 2015-01-17 LAB — CULTURE, BODY FLUID-BOTTLE: CULTURE: NO GROWTH

## 2015-01-19 DIAGNOSIS — Z96651 Presence of right artificial knee joint: Secondary | ICD-10-CM | POA: Diagnosis not present

## 2015-01-19 DIAGNOSIS — Z471 Aftercare following joint replacement surgery: Secondary | ICD-10-CM | POA: Diagnosis not present

## 2015-01-19 DIAGNOSIS — I82622 Acute embolism and thrombosis of deep veins of left upper extremity: Secondary | ICD-10-CM | POA: Diagnosis not present

## 2015-01-26 ENCOUNTER — Non-Acute Institutional Stay (SKILLED_NURSING_FACILITY): Payer: Medicare Other | Admitting: Nurse Practitioner

## 2015-01-26 DIAGNOSIS — I82622 Acute embolism and thrombosis of deep veins of left upper extremity: Secondary | ICD-10-CM | POA: Diagnosis not present

## 2015-01-26 DIAGNOSIS — M1711 Unilateral primary osteoarthritis, right knee: Secondary | ICD-10-CM

## 2015-01-26 DIAGNOSIS — K219 Gastro-esophageal reflux disease without esophagitis: Secondary | ICD-10-CM | POA: Diagnosis not present

## 2015-01-26 DIAGNOSIS — E119 Type 2 diabetes mellitus without complications: Secondary | ICD-10-CM | POA: Diagnosis not present

## 2015-01-26 DIAGNOSIS — I10 Essential (primary) hypertension: Secondary | ICD-10-CM

## 2015-01-26 DIAGNOSIS — M109 Gout, unspecified: Secondary | ICD-10-CM

## 2015-01-26 DIAGNOSIS — D509 Iron deficiency anemia, unspecified: Secondary | ICD-10-CM

## 2015-01-26 MED ORDER — WARFARIN SODIUM 4 MG PO TABS: ORAL_TABLET | ORAL | Status: DC

## 2015-01-26 NOTE — Progress Notes (Signed)
Patient ID: Andrew Winters, male   DOB: 1940/08/21, 74 y.o.   MRN: [id number]    Nursing Home Location:  [facility]   Place of Service: SNF (31)  PCP: Crist Fat, MD  Allergies  Allergen Reactions  . Aspirin Diarrhea    Chief Complaint  Patient presents with  . Discharge Note    HPI:  Patient is a 74 y.o. male seen today at [facility] for discharge home. Pt had right total knee arthroplasty on 01/06/15 due to end stage OA. Patient had high fevers and some confusion after surgery. He was found to have a Left upper extremity DVT along with pseudogout in his replaced knee. He was place on Lovenox bridge to coumadin for his LUE DVT and indocin for his knee. Pt remains on Lovenox as INR is 1.5.   Review of Systems:  Review of Systems  Constitutional: Negative for activity change, appetite change, fatigue and unexpected weight change.  HENT: Negative for congestion and hearing loss.   Eyes: Negative.   Respiratory: Negative for cough and shortness of breath.   Cardiovascular: Negative for chest pain, palpitations and leg swelling.  Gastrointestinal: Negative for abdominal pain, diarrhea and constipation.  Genitourinary: Negative for dysuria and difficulty urinating.  Musculoskeletal: Positive for arthralgias (improved with pain managment). Negative for myalgias.  Skin: Negative for color change and wound.  Neurological: Negative for dizziness and weakness.  Psychiatric/Behavioral: Negative for behavioral problems, confusion and agitation.    Past Medical History  Diagnosis Date  . Bilateral swelling of feet   . Headaches, cluster   . Anxiety   . Fatigue   . Rheumatoid arthritis(714.0)   . Muscle pain   . Hiatal hernia   . Vitamin D deficiency     takes Vit D daily  . BPH (benign prostatic hyperplasia)     no meds needed per medical md  . Osteoarthritis   . Hyperthyroidism   . Deafness     in left ear   . Hyperlipemia     was on  meds but hasnt been on anything in about 53months per MD  . Presbyesophagus   . Insomnia     takes Trazodone nightly  . Depression     takes Zoloft daily  . Dysphagia   . Hypertension     takes Amlodipine,HCTZ,and Lisinopril daily  . Pneumonia 3+ yrs ago  . Peripheral neuropathy     takes Gabapentin daily  . Joint pain   . Joint swelling   . GERD (gastroesophageal reflux disease)     takes Omeprazole daily  . Chronic diarrhea   . Nocturia   . Diabetes mellitus     takes Actos and Amaryl daily as well as Onglyza  . Cataracts, bilateral     immature   Past Surgical History  Procedure Laterality Date  . Back surgery  1998  . Colonoscopy with esophagogastroduodenoscopy (egd) and esophageal dilation (ed)    . Total knee arthroplasty Right 01/06/2015    Procedure: TOTAL KNEE ARTHROPLASTY;  Surgeon: Marcene Corning, MD;  Location: [facility] OR;  Service: Orthopedics;  Laterality: Right;  . Irrigation and debridement knee Right 01/12/2015   Social History:   reports that he quit smoking about 3 months ago. His smoking use included Cigarettes. He has a 35 pack-year smoking history. He has never used smokeless tobacco. He reports that he does not drink alcohol or use illicit drugs.  Family History  Problem Relation Age of  Onset  . Heart disease Mother   . Diabetes Mother   . Heart attack Father   . Colon cancer Neg Hx   . Diabetes Brother     Medications: Patient's Medications  New Prescriptions   No medications on file  Previous Medications   ACETAMINOPHEN (TYLENOL) 500 MG TABLET    Take 500 mg by mouth every 6 (six) hours as needed for mild pain or moderate pain.   AMLODIPINE (NORVASC) 5 MG TABLET    Take 5 mg by mouth daily.     CHOLECALCIFEROL (VITAMIN D) 1000 UNITS CAPSULE    Take 1,000 Units by mouth 2 (two) times daily.     DOCUSATE SODIUM (COLACE) 100 MG CAPSULE    Take 1 capsule (100 mg total) by mouth 2 (two) times daily.   DONEPEZIL (ARICEPT) 5 MG TABLET    Take 5 mg by  mouth at bedtime.    ENOXAPARIN (LOVENOX) 150 MG/ML INJECTION    Inject 0.85 mLs (130 mg total) into the skin every 12 (twelve) hours.   FERROUS SULFATE 325 (65 FE) MG TABLET    Take 1 tablet (325 mg total) by mouth 3 (three) times daily with meals.   FLUTICASONE (FLONASE) 50 MCG/ACT NASAL SPRAY    Place 1 spray into both nostrils daily.   GABAPENTIN (NEURONTIN) 100 MG CAPSULE    Take 100 mg by mouth 3 (three) times daily.     GLIMEPIRIDE (AMARYL) 4 MG TABLET    Take 4 mg by mouth daily before breakfast.     INDOMETHACIN (INDOCIN SR) 75 MG CR CAPSULE    Take 1 capsule (75 mg total) by mouth 2 (two) times daily with a meal. Take for 3 days then as needed.   LISINOPRIL (PRINIVIL,ZESTRIL) 2.5 MG TABLET    Take 2.5 mg by mouth 2 (two) times daily.    OMEPRAZOLE 20 MG TBEC    Take 20 mg by mouth daily at 12 noon.    ONGLYZA 2.5 MG TABS TABLET    Take 2.5 mg by mouth daily.    PIOGLITAZONE (ACTOS) 15 MG TABLET    Take 15 mg by mouth daily.    SERTRALINE (ZOLOFT) 25 MG TABLET    Take 25 mg by mouth daily.    TRAMADOL (ULTRAM) 50 MG TABLET    Take 1 tablet (50 mg total) by mouth every 6 (six) hours as needed for moderate pain or severe pain.   TRAZODONE (DESYREL) 50 MG TABLET    Take 50 mg by mouth at bedtime.   WARFARIN (COUMADIN) 7.5 MG TABLET    Take 1 tablet (7.5 mg total) by mouth daily at 6 PM.  Modified Medications   No medications on file  Discontinued Medications   No medications on file     Physical Exam: Filed Vitals:   01/26/15 1254  BP: 132/62  Pulse: 76  Temp: 97.6 F (36.4 C)  Resp: 16    Physical Exam  Constitutional: He is oriented to person, place, and time. He appears well-developed and well-nourished. No distress.  HENT:  Head: Normocephalic and atraumatic.  Mouth/Throat: Oropharynx is clear and moist. No oropharyngeal exudate.  Eyes: Conjunctivae and EOM are normal. Pupils are equal, round, and reactive to light.  Neck: Normal range of motion. Neck supple.    Cardiovascular: Normal rate, regular rhythm and normal heart sounds.   Pulmonary/Chest: Effort normal and breath sounds normal.  Abdominal: Soft. Bowel sounds are normal.  Musculoskeletal: He exhibits no edema or  tenderness.  Neurological: He is alert and oriented to person, place, and time.  Skin: Skin is warm and dry. He is not diaphoretic.  Well healed right knee incision   Psychiatric: He has a normal mood and affect.    Labs reviewed: Basic Metabolic Panel:  Recent Labs  22/02/54 0439 01/13/15 0425 01/14/15 0609  NA 136 137 137  K 3.5 3.4* 3.8  CL 100* 101 103  CO2 28 29 28   GLUCOSE 139* 123* 122*  BUN 10 9 7   CREATININE 0.82 0.82 0.83  CALCIUM 7.9* 7.9* 8.2*   Liver Function Tests:  Recent Labs  01/08/15 0008  AST 27  ALT 12*  ALKPHOS 53  BILITOT 0.5  PROT 5.9*  ALBUMIN 3.0*   No results for input(s): LIPASE, AMYLASE in the last 8760 hours. No results for input(s): AMMONIA in the last 8760 hours. CBC:  Recent Labs  12/25/14 1404  01/08/15 0008  01/10/15 0433 01/11/15 0641 01/12/15 0439 01/14/15 0609  WBC 6.4  < > 7.1  < > 6.3 5.8 5.1 5.3  NEUTROABS 3.8  --  5.1  --  4.4  --   --   --   HGB 13.3  < > 10.3*  < > 8.9* 9.4* 9.2* 9.2*  HCT 41.0  < > 32.0*  < > 27.5* 29.1* 28.0* 29.1*  MCV 84.5  < > 84.7  < > 83.6 83.6 84.6 84.6  PLT 214  < > 144*  < > 151 187 205 288  < > = values in this interval not displayed. TSH: No results for input(s): TSH in the last 8760 hours. A1C: Lab Results  Component Value Date   HGBA1C 7.0* 01/12/2015   Lipid Panel: No results for input(s): CHOL, HDL, LDLCALC, TRIG, CHOLHDL, LDLDIRECT in the last 8760 hours.  Result Date: 01/19/15 11:50 AM    Analyte   Result Value   Ref. Range    Units   Out of Range   Lab  WBC  8.0  4.0-10.5  K/uL    SLN  RBC  3.57  4.22-5.81  MIL/uL  L    Hemoglobin  9.5  13.0-17.0  g/dL  L    Hematocrit  06-[birth year]-1972  39.0-52.0  %  L    MCV  85.2  78.0-100.0  fL      MCH  26.6  26.0-34.0  pg       MCHC  31.3  30.0-36.0  g/dL      RDW  08-24-1984  03-27-1999  %  H    Platelet Count  407  150-400  K/uL  H    MPV  8.6  8.6-12.4  fL      Basic Metabolic Panel       Analyte   Result Value   Ref. Range    Units   Out of Range   Lab  Sodium  142  135-145  mEq/L    SLN  Potassium  4.3  3.5-5.3  mEq/L      Chloride  105  96-112  mEq/L      CO2  26  19-32  mEq/L      Glucose  168  70-99  mg/dL  H    BUN  17  76.2-83.1  mg/dL      Creatinine  10-31-2002  0.50-1.35  mg/dL      Calcium  8.8  6.16  mg/dL        Assessment/Plan 1. Essential hypertension Blood pressure stable,  conts on lisinopril.   2. Diabetes mellitus type 2, controlled -to cont amaryl and onglyza and actos  3. Gastroesophageal reflux disease, esophagitis presence not specified -stable, conts on omeprazole daily   4. Primary osteoarthritis of right knee S/p total knee, pain is controlled on current regimen Doing well with rehab at this time, will need outpatient therapy conts on coumadin and lovenox at this time  5. Anemia, iron deficiency -hgb stable, conts on iron TID with meals  6. DVT of upper extremity (deep vein thrombosis), left Ongoing titration of coumadin, will con Lovenox until INR therapeutic. Will increase coumadin to 9 mg daily and have HH to follow up INR in 1 week  7. Gout, unspecified cause, unspecified chronicity, unspecified site -stable, conts on indomethacin  pt is stable for discharge-will need PT/OT/Nursing per home health. DME needed: FWW to maintain functional status. Rx written.  will need to follow up with PCP within 2 weeks.     Janene Harvey. Biagio Borg  [facility] [phone] 8 am - 5 pm) [phone] (after hours)

## 2015-01-30 DIAGNOSIS — E669 Obesity, unspecified: Secondary | ICD-10-CM | POA: Diagnosis not present

## 2015-01-30 DIAGNOSIS — I82622 Acute embolism and thrombosis of deep veins of left upper extremity: Secondary | ICD-10-CM | POA: Diagnosis not present

## 2015-01-30 DIAGNOSIS — I10 Essential (primary) hypertension: Secondary | ICD-10-CM | POA: Diagnosis not present

## 2015-01-30 DIAGNOSIS — F329 Major depressive disorder, single episode, unspecified: Secondary | ICD-10-CM | POA: Diagnosis not present

## 2015-01-30 DIAGNOSIS — Z96651 Presence of right artificial knee joint: Secondary | ICD-10-CM | POA: Diagnosis not present

## 2015-01-30 DIAGNOSIS — F0390 Unspecified dementia without behavioral disturbance: Secondary | ICD-10-CM | POA: Diagnosis not present

## 2015-01-30 DIAGNOSIS — M1990 Unspecified osteoarthritis, unspecified site: Secondary | ICD-10-CM | POA: Diagnosis not present

## 2015-01-30 DIAGNOSIS — D500 Iron deficiency anemia secondary to blood loss (chronic): Secondary | ICD-10-CM | POA: Diagnosis not present

## 2015-01-30 DIAGNOSIS — M069 Rheumatoid arthritis, unspecified: Secondary | ICD-10-CM | POA: Diagnosis not present

## 2015-01-30 DIAGNOSIS — M6281 Muscle weakness (generalized): Secondary | ICD-10-CM | POA: Diagnosis not present

## 2015-01-30 DIAGNOSIS — Z471 Aftercare following joint replacement surgery: Secondary | ICD-10-CM | POA: Diagnosis not present

## 2015-01-30 DIAGNOSIS — Z9181 History of falling: Secondary | ICD-10-CM | POA: Diagnosis not present

## 2015-01-30 DIAGNOSIS — E1140 Type 2 diabetes mellitus with diabetic neuropathy, unspecified: Secondary | ICD-10-CM | POA: Diagnosis not present

## 2015-01-31 DIAGNOSIS — Z96651 Presence of right artificial knee joint: Secondary | ICD-10-CM | POA: Diagnosis not present

## 2015-01-31 DIAGNOSIS — E1140 Type 2 diabetes mellitus with diabetic neuropathy, unspecified: Secondary | ICD-10-CM | POA: Diagnosis not present

## 2015-01-31 DIAGNOSIS — Z9181 History of falling: Secondary | ICD-10-CM | POA: Diagnosis not present

## 2015-01-31 DIAGNOSIS — F329 Major depressive disorder, single episode, unspecified: Secondary | ICD-10-CM | POA: Diagnosis not present

## 2015-01-31 DIAGNOSIS — I10 Essential (primary) hypertension: Secondary | ICD-10-CM | POA: Diagnosis not present

## 2015-01-31 DIAGNOSIS — M6281 Muscle weakness (generalized): Secondary | ICD-10-CM | POA: Diagnosis not present

## 2015-01-31 DIAGNOSIS — F0390 Unspecified dementia without behavioral disturbance: Secondary | ICD-10-CM | POA: Diagnosis not present

## 2015-01-31 DIAGNOSIS — D500 Iron deficiency anemia secondary to blood loss (chronic): Secondary | ICD-10-CM | POA: Diagnosis not present

## 2015-01-31 DIAGNOSIS — I82622 Acute embolism and thrombosis of deep veins of left upper extremity: Secondary | ICD-10-CM | POA: Diagnosis not present

## 2015-01-31 DIAGNOSIS — M1990 Unspecified osteoarthritis, unspecified site: Secondary | ICD-10-CM | POA: Diagnosis not present

## 2015-01-31 DIAGNOSIS — Z471 Aftercare following joint replacement surgery: Secondary | ICD-10-CM | POA: Diagnosis not present

## 2015-01-31 DIAGNOSIS — M069 Rheumatoid arthritis, unspecified: Secondary | ICD-10-CM | POA: Diagnosis not present

## 2015-01-31 DIAGNOSIS — E669 Obesity, unspecified: Secondary | ICD-10-CM | POA: Diagnosis not present

## 2015-02-02 DIAGNOSIS — E669 Obesity, unspecified: Secondary | ICD-10-CM | POA: Diagnosis not present

## 2015-02-02 DIAGNOSIS — D500 Iron deficiency anemia secondary to blood loss (chronic): Secondary | ICD-10-CM | POA: Diagnosis not present

## 2015-02-02 DIAGNOSIS — N390 Urinary tract infection, site not specified: Secondary | ICD-10-CM | POA: Diagnosis not present

## 2015-02-02 DIAGNOSIS — R531 Weakness: Secondary | ICD-10-CM | POA: Diagnosis not present

## 2015-02-02 DIAGNOSIS — R509 Fever, unspecified: Secondary | ICD-10-CM | POA: Diagnosis not present

## 2015-02-02 DIAGNOSIS — Z01810 Encounter for preprocedural cardiovascular examination: Secondary | ICD-10-CM | POA: Diagnosis not present

## 2015-02-02 DIAGNOSIS — Z96651 Presence of right artificial knee joint: Secondary | ICD-10-CM | POA: Diagnosis not present

## 2015-02-02 DIAGNOSIS — Z888 Allergy status to other drugs, medicaments and biological substances status: Secondary | ICD-10-CM | POA: Diagnosis not present

## 2015-02-02 DIAGNOSIS — I82622 Acute embolism and thrombosis of deep veins of left upper extremity: Secondary | ICD-10-CM | POA: Diagnosis not present

## 2015-02-02 DIAGNOSIS — Z9181 History of falling: Secondary | ICD-10-CM | POA: Diagnosis not present

## 2015-02-02 DIAGNOSIS — R600 Localized edema: Secondary | ICD-10-CM | POA: Diagnosis not present

## 2015-02-02 DIAGNOSIS — I6521 Occlusion and stenosis of right carotid artery: Secondary | ICD-10-CM | POA: Diagnosis not present

## 2015-02-02 DIAGNOSIS — Z7901 Long term (current) use of anticoagulants: Secondary | ICD-10-CM | POA: Diagnosis not present

## 2015-02-02 DIAGNOSIS — F329 Major depressive disorder, single episode, unspecified: Secondary | ICD-10-CM | POA: Diagnosis not present

## 2015-02-02 DIAGNOSIS — R109 Unspecified abdominal pain: Secondary | ICD-10-CM | POA: Diagnosis not present

## 2015-02-02 DIAGNOSIS — R1312 Dysphagia, oropharyngeal phase: Secondary | ICD-10-CM | POA: Diagnosis not present

## 2015-02-02 DIAGNOSIS — M069 Rheumatoid arthritis, unspecified: Secondary | ICD-10-CM | POA: Diagnosis not present

## 2015-02-02 DIAGNOSIS — I878 Other specified disorders of veins: Secondary | ICD-10-CM | POA: Diagnosis not present

## 2015-02-02 DIAGNOSIS — D649 Anemia, unspecified: Secondary | ICD-10-CM | POA: Diagnosis not present

## 2015-02-02 DIAGNOSIS — B9620 Unspecified Escherichia coli [E. coli] as the cause of diseases classified elsewhere: Secondary | ICD-10-CM | POA: Diagnosis not present

## 2015-02-02 DIAGNOSIS — M7989 Other specified soft tissue disorders: Secondary | ICD-10-CM | POA: Diagnosis not present

## 2015-02-02 DIAGNOSIS — R4781 Slurred speech: Secondary | ICD-10-CM | POA: Diagnosis not present

## 2015-02-02 DIAGNOSIS — M1990 Unspecified osteoarthritis, unspecified site: Secondary | ICD-10-CM | POA: Diagnosis not present

## 2015-02-02 DIAGNOSIS — F0390 Unspecified dementia without behavioral disturbance: Secondary | ICD-10-CM | POA: Diagnosis not present

## 2015-02-02 DIAGNOSIS — F418 Other specified anxiety disorders: Secondary | ICD-10-CM | POA: Diagnosis not present

## 2015-02-02 DIAGNOSIS — E1140 Type 2 diabetes mellitus with diabetic neuropathy, unspecified: Secondary | ICD-10-CM | POA: Diagnosis not present

## 2015-02-02 DIAGNOSIS — I10 Essential (primary) hypertension: Secondary | ICD-10-CM | POA: Diagnosis not present

## 2015-02-02 DIAGNOSIS — R1110 Vomiting, unspecified: Secondary | ICD-10-CM | POA: Diagnosis not present

## 2015-02-02 DIAGNOSIS — E119 Type 2 diabetes mellitus without complications: Secondary | ICD-10-CM | POA: Diagnosis not present

## 2015-02-02 DIAGNOSIS — D508 Other iron deficiency anemias: Secondary | ICD-10-CM | POA: Diagnosis not present

## 2015-02-02 DIAGNOSIS — Z79899 Other long term (current) drug therapy: Secondary | ICD-10-CM | POA: Diagnosis not present

## 2015-02-02 DIAGNOSIS — I639 Cerebral infarction, unspecified: Secondary | ICD-10-CM | POA: Diagnosis not present

## 2015-02-02 DIAGNOSIS — M6281 Muscle weakness (generalized): Secondary | ICD-10-CM | POA: Diagnosis not present

## 2015-02-02 DIAGNOSIS — Z471 Aftercare following joint replacement surgery: Secondary | ICD-10-CM | POA: Diagnosis not present

## 2015-02-02 DIAGNOSIS — E780 Pure hypercholesterolemia: Secondary | ICD-10-CM | POA: Diagnosis not present

## 2015-02-02 DIAGNOSIS — K219 Gastro-esophageal reflux disease without esophagitis: Secondary | ICD-10-CM | POA: Diagnosis not present

## 2015-02-02 DIAGNOSIS — G451 Carotid artery syndrome (hemispheric): Secondary | ICD-10-CM | POA: Diagnosis not present

## 2015-02-02 DIAGNOSIS — K5900 Constipation, unspecified: Secondary | ICD-10-CM | POA: Diagnosis not present

## 2015-02-02 DIAGNOSIS — Z6838 Body mass index (BMI) 38.0-38.9, adult: Secondary | ICD-10-CM | POA: Diagnosis not present

## 2015-02-02 DIAGNOSIS — Z87891 Personal history of nicotine dependence: Secondary | ICD-10-CM | POA: Diagnosis not present

## 2015-02-02 DIAGNOSIS — E785 Hyperlipidemia, unspecified: Secondary | ICD-10-CM | POA: Diagnosis not present

## 2015-02-02 DIAGNOSIS — Z8673 Personal history of transient ischemic attack (TIA), and cerebral infarction without residual deficits: Secondary | ICD-10-CM | POA: Diagnosis not present

## 2015-02-02 DIAGNOSIS — E1165 Type 2 diabetes mellitus with hyperglycemia: Secondary | ICD-10-CM | POA: Diagnosis not present

## 2015-02-09 DIAGNOSIS — R6510 Systemic inflammatory response syndrome (SIRS) of non-infectious origin without acute organ dysfunction: Secondary | ICD-10-CM | POA: Diagnosis not present

## 2015-02-09 DIAGNOSIS — N390 Urinary tract infection, site not specified: Secondary | ICD-10-CM | POA: Diagnosis not present

## 2015-02-09 DIAGNOSIS — R262 Difficulty in walking, not elsewhere classified: Secondary | ICD-10-CM | POA: Diagnosis not present

## 2015-02-09 DIAGNOSIS — I82722 Chronic embolism and thrombosis of deep veins of left upper extremity: Secondary | ICD-10-CM | POA: Diagnosis not present

## 2015-02-09 DIAGNOSIS — D649 Anemia, unspecified: Secondary | ICD-10-CM | POA: Diagnosis not present

## 2015-02-09 DIAGNOSIS — Z96651 Presence of right artificial knee joint: Secondary | ICD-10-CM | POA: Diagnosis not present

## 2015-02-10 DIAGNOSIS — F329 Major depressive disorder, single episode, unspecified: Secondary | ICD-10-CM | POA: Diagnosis not present

## 2015-02-10 DIAGNOSIS — M1990 Unspecified osteoarthritis, unspecified site: Secondary | ICD-10-CM | POA: Diagnosis not present

## 2015-02-10 DIAGNOSIS — E669 Obesity, unspecified: Secondary | ICD-10-CM | POA: Diagnosis not present

## 2015-02-10 DIAGNOSIS — M6281 Muscle weakness (generalized): Secondary | ICD-10-CM | POA: Diagnosis not present

## 2015-02-10 DIAGNOSIS — Z471 Aftercare following joint replacement surgery: Secondary | ICD-10-CM | POA: Diagnosis not present

## 2015-02-10 DIAGNOSIS — M069 Rheumatoid arthritis, unspecified: Secondary | ICD-10-CM | POA: Diagnosis not present

## 2015-02-10 DIAGNOSIS — D500 Iron deficiency anemia secondary to blood loss (chronic): Secondary | ICD-10-CM | POA: Diagnosis not present

## 2015-02-10 DIAGNOSIS — I82622 Acute embolism and thrombosis of deep veins of left upper extremity: Secondary | ICD-10-CM | POA: Diagnosis not present

## 2015-02-10 DIAGNOSIS — Z96651 Presence of right artificial knee joint: Secondary | ICD-10-CM | POA: Diagnosis not present

## 2015-02-10 DIAGNOSIS — E1140 Type 2 diabetes mellitus with diabetic neuropathy, unspecified: Secondary | ICD-10-CM | POA: Diagnosis not present

## 2015-02-10 DIAGNOSIS — Z9181 History of falling: Secondary | ICD-10-CM | POA: Diagnosis not present

## 2015-02-10 DIAGNOSIS — F0390 Unspecified dementia without behavioral disturbance: Secondary | ICD-10-CM | POA: Diagnosis not present

## 2015-02-10 DIAGNOSIS — I10 Essential (primary) hypertension: Secondary | ICD-10-CM | POA: Diagnosis not present

## 2015-02-11 DIAGNOSIS — Z471 Aftercare following joint replacement surgery: Secondary | ICD-10-CM | POA: Diagnosis not present

## 2015-02-11 DIAGNOSIS — Z9889 Other specified postprocedural states: Secondary | ICD-10-CM | POA: Diagnosis not present

## 2015-02-11 DIAGNOSIS — Z9181 History of falling: Secondary | ICD-10-CM | POA: Diagnosis not present

## 2015-02-11 DIAGNOSIS — M6281 Muscle weakness (generalized): Secondary | ICD-10-CM | POA: Diagnosis not present

## 2015-02-11 DIAGNOSIS — D500 Iron deficiency anemia secondary to blood loss (chronic): Secondary | ICD-10-CM | POA: Diagnosis not present

## 2015-02-11 DIAGNOSIS — F0390 Unspecified dementia without behavioral disturbance: Secondary | ICD-10-CM | POA: Diagnosis not present

## 2015-02-11 DIAGNOSIS — M069 Rheumatoid arthritis, unspecified: Secondary | ICD-10-CM | POA: Diagnosis not present

## 2015-02-11 DIAGNOSIS — E669 Obesity, unspecified: Secondary | ICD-10-CM | POA: Diagnosis not present

## 2015-02-11 DIAGNOSIS — M1990 Unspecified osteoarthritis, unspecified site: Secondary | ICD-10-CM | POA: Diagnosis not present

## 2015-02-11 DIAGNOSIS — F329 Major depressive disorder, single episode, unspecified: Secondary | ICD-10-CM | POA: Diagnosis not present

## 2015-02-11 DIAGNOSIS — I10 Essential (primary) hypertension: Secondary | ICD-10-CM | POA: Diagnosis not present

## 2015-02-11 DIAGNOSIS — Z96651 Presence of right artificial knee joint: Secondary | ICD-10-CM | POA: Diagnosis not present

## 2015-02-11 DIAGNOSIS — E1140 Type 2 diabetes mellitus with diabetic neuropathy, unspecified: Secondary | ICD-10-CM | POA: Diagnosis not present

## 2015-02-11 DIAGNOSIS — I82622 Acute embolism and thrombosis of deep veins of left upper extremity: Secondary | ICD-10-CM | POA: Diagnosis not present

## 2015-02-12 DIAGNOSIS — I10 Essential (primary) hypertension: Secondary | ICD-10-CM | POA: Diagnosis not present

## 2015-02-12 DIAGNOSIS — F0390 Unspecified dementia without behavioral disturbance: Secondary | ICD-10-CM | POA: Diagnosis not present

## 2015-02-12 DIAGNOSIS — M6281 Muscle weakness (generalized): Secondary | ICD-10-CM | POA: Diagnosis not present

## 2015-02-12 DIAGNOSIS — E1140 Type 2 diabetes mellitus with diabetic neuropathy, unspecified: Secondary | ICD-10-CM | POA: Diagnosis not present

## 2015-02-12 DIAGNOSIS — Z9181 History of falling: Secondary | ICD-10-CM | POA: Diagnosis not present

## 2015-02-12 DIAGNOSIS — Z471 Aftercare following joint replacement surgery: Secondary | ICD-10-CM | POA: Diagnosis not present

## 2015-02-12 DIAGNOSIS — E669 Obesity, unspecified: Secondary | ICD-10-CM | POA: Diagnosis not present

## 2015-02-12 DIAGNOSIS — M069 Rheumatoid arthritis, unspecified: Secondary | ICD-10-CM | POA: Diagnosis not present

## 2015-02-12 DIAGNOSIS — M1990 Unspecified osteoarthritis, unspecified site: Secondary | ICD-10-CM | POA: Diagnosis not present

## 2015-02-12 DIAGNOSIS — I82622 Acute embolism and thrombosis of deep veins of left upper extremity: Secondary | ICD-10-CM | POA: Diagnosis not present

## 2015-02-12 DIAGNOSIS — F329 Major depressive disorder, single episode, unspecified: Secondary | ICD-10-CM | POA: Diagnosis not present

## 2015-02-12 DIAGNOSIS — D500 Iron deficiency anemia secondary to blood loss (chronic): Secondary | ICD-10-CM | POA: Diagnosis not present

## 2015-02-12 DIAGNOSIS — Z96651 Presence of right artificial knee joint: Secondary | ICD-10-CM | POA: Diagnosis not present

## 2015-02-16 DIAGNOSIS — I82622 Acute embolism and thrombosis of deep veins of left upper extremity: Secondary | ICD-10-CM | POA: Diagnosis not present

## 2015-02-16 DIAGNOSIS — D500 Iron deficiency anemia secondary to blood loss (chronic): Secondary | ICD-10-CM | POA: Diagnosis not present

## 2015-02-16 DIAGNOSIS — E669 Obesity, unspecified: Secondary | ICD-10-CM | POA: Diagnosis not present

## 2015-02-16 DIAGNOSIS — M069 Rheumatoid arthritis, unspecified: Secondary | ICD-10-CM | POA: Diagnosis not present

## 2015-02-16 DIAGNOSIS — F0390 Unspecified dementia without behavioral disturbance: Secondary | ICD-10-CM | POA: Diagnosis not present

## 2015-02-16 DIAGNOSIS — M6281 Muscle weakness (generalized): Secondary | ICD-10-CM | POA: Diagnosis not present

## 2015-02-16 DIAGNOSIS — Z471 Aftercare following joint replacement surgery: Secondary | ICD-10-CM | POA: Diagnosis not present

## 2015-02-16 DIAGNOSIS — Z96651 Presence of right artificial knee joint: Secondary | ICD-10-CM | POA: Diagnosis not present

## 2015-02-16 DIAGNOSIS — Z9181 History of falling: Secondary | ICD-10-CM | POA: Diagnosis not present

## 2015-02-16 DIAGNOSIS — E1140 Type 2 diabetes mellitus with diabetic neuropathy, unspecified: Secondary | ICD-10-CM | POA: Diagnosis not present

## 2015-02-16 DIAGNOSIS — M1990 Unspecified osteoarthritis, unspecified site: Secondary | ICD-10-CM | POA: Diagnosis not present

## 2015-02-16 DIAGNOSIS — I10 Essential (primary) hypertension: Secondary | ICD-10-CM | POA: Diagnosis not present

## 2015-02-16 DIAGNOSIS — F329 Major depressive disorder, single episode, unspecified: Secondary | ICD-10-CM | POA: Diagnosis not present

## 2015-02-17 DIAGNOSIS — F0390 Unspecified dementia without behavioral disturbance: Secondary | ICD-10-CM | POA: Diagnosis not present

## 2015-02-17 DIAGNOSIS — M069 Rheumatoid arthritis, unspecified: Secondary | ICD-10-CM | POA: Diagnosis not present

## 2015-02-17 DIAGNOSIS — E669 Obesity, unspecified: Secondary | ICD-10-CM | POA: Diagnosis not present

## 2015-02-17 DIAGNOSIS — Z9181 History of falling: Secondary | ICD-10-CM | POA: Diagnosis not present

## 2015-02-17 DIAGNOSIS — Z471 Aftercare following joint replacement surgery: Secondary | ICD-10-CM | POA: Diagnosis not present

## 2015-02-17 DIAGNOSIS — M6281 Muscle weakness (generalized): Secondary | ICD-10-CM | POA: Diagnosis not present

## 2015-02-17 DIAGNOSIS — I82622 Acute embolism and thrombosis of deep veins of left upper extremity: Secondary | ICD-10-CM | POA: Diagnosis not present

## 2015-02-17 DIAGNOSIS — E1140 Type 2 diabetes mellitus with diabetic neuropathy, unspecified: Secondary | ICD-10-CM | POA: Diagnosis not present

## 2015-02-17 DIAGNOSIS — I10 Essential (primary) hypertension: Secondary | ICD-10-CM | POA: Diagnosis not present

## 2015-02-17 DIAGNOSIS — M1990 Unspecified osteoarthritis, unspecified site: Secondary | ICD-10-CM | POA: Diagnosis not present

## 2015-02-17 DIAGNOSIS — Z96651 Presence of right artificial knee joint: Secondary | ICD-10-CM | POA: Diagnosis not present

## 2015-02-17 DIAGNOSIS — F329 Major depressive disorder, single episode, unspecified: Secondary | ICD-10-CM | POA: Diagnosis not present

## 2015-02-17 DIAGNOSIS — D500 Iron deficiency anemia secondary to blood loss (chronic): Secondary | ICD-10-CM | POA: Diagnosis not present

## 2015-02-18 DIAGNOSIS — M069 Rheumatoid arthritis, unspecified: Secondary | ICD-10-CM | POA: Diagnosis not present

## 2015-02-18 DIAGNOSIS — I82622 Acute embolism and thrombosis of deep veins of left upper extremity: Secondary | ICD-10-CM | POA: Diagnosis not present

## 2015-02-18 DIAGNOSIS — D500 Iron deficiency anemia secondary to blood loss (chronic): Secondary | ICD-10-CM | POA: Diagnosis not present

## 2015-02-18 DIAGNOSIS — E669 Obesity, unspecified: Secondary | ICD-10-CM | POA: Diagnosis not present

## 2015-02-18 DIAGNOSIS — I10 Essential (primary) hypertension: Secondary | ICD-10-CM | POA: Diagnosis not present

## 2015-02-18 DIAGNOSIS — Z96651 Presence of right artificial knee joint: Secondary | ICD-10-CM | POA: Diagnosis not present

## 2015-02-18 DIAGNOSIS — F329 Major depressive disorder, single episode, unspecified: Secondary | ICD-10-CM | POA: Diagnosis not present

## 2015-02-18 DIAGNOSIS — M1990 Unspecified osteoarthritis, unspecified site: Secondary | ICD-10-CM | POA: Diagnosis not present

## 2015-02-18 DIAGNOSIS — E1140 Type 2 diabetes mellitus with diabetic neuropathy, unspecified: Secondary | ICD-10-CM | POA: Diagnosis not present

## 2015-02-18 DIAGNOSIS — Z471 Aftercare following joint replacement surgery: Secondary | ICD-10-CM | POA: Diagnosis not present

## 2015-02-18 DIAGNOSIS — F0390 Unspecified dementia without behavioral disturbance: Secondary | ICD-10-CM | POA: Diagnosis not present

## 2015-02-18 DIAGNOSIS — M6281 Muscle weakness (generalized): Secondary | ICD-10-CM | POA: Diagnosis not present

## 2015-02-18 DIAGNOSIS — Z9181 History of falling: Secondary | ICD-10-CM | POA: Diagnosis not present

## 2015-02-19 DIAGNOSIS — I82622 Acute embolism and thrombosis of deep veins of left upper extremity: Secondary | ICD-10-CM | POA: Diagnosis not present

## 2015-02-19 DIAGNOSIS — F0390 Unspecified dementia without behavioral disturbance: Secondary | ICD-10-CM | POA: Diagnosis not present

## 2015-02-19 DIAGNOSIS — M1990 Unspecified osteoarthritis, unspecified site: Secondary | ICD-10-CM | POA: Diagnosis not present

## 2015-02-19 DIAGNOSIS — F329 Major depressive disorder, single episode, unspecified: Secondary | ICD-10-CM | POA: Diagnosis not present

## 2015-02-19 DIAGNOSIS — M069 Rheumatoid arthritis, unspecified: Secondary | ICD-10-CM | POA: Diagnosis not present

## 2015-02-19 DIAGNOSIS — E1140 Type 2 diabetes mellitus with diabetic neuropathy, unspecified: Secondary | ICD-10-CM | POA: Diagnosis not present

## 2015-02-19 DIAGNOSIS — E669 Obesity, unspecified: Secondary | ICD-10-CM | POA: Diagnosis not present

## 2015-02-19 DIAGNOSIS — I10 Essential (primary) hypertension: Secondary | ICD-10-CM | POA: Diagnosis not present

## 2015-02-19 DIAGNOSIS — M6281 Muscle weakness (generalized): Secondary | ICD-10-CM | POA: Diagnosis not present

## 2015-02-19 DIAGNOSIS — Z471 Aftercare following joint replacement surgery: Secondary | ICD-10-CM | POA: Diagnosis not present

## 2015-02-19 DIAGNOSIS — D500 Iron deficiency anemia secondary to blood loss (chronic): Secondary | ICD-10-CM | POA: Diagnosis not present

## 2015-02-19 DIAGNOSIS — Z9181 History of falling: Secondary | ICD-10-CM | POA: Diagnosis not present

## 2015-02-19 DIAGNOSIS — Z96651 Presence of right artificial knee joint: Secondary | ICD-10-CM | POA: Diagnosis not present

## 2015-02-20 DIAGNOSIS — F329 Major depressive disorder, single episode, unspecified: Secondary | ICD-10-CM | POA: Diagnosis not present

## 2015-02-20 DIAGNOSIS — M069 Rheumatoid arthritis, unspecified: Secondary | ICD-10-CM | POA: Diagnosis not present

## 2015-02-20 DIAGNOSIS — E1140 Type 2 diabetes mellitus with diabetic neuropathy, unspecified: Secondary | ICD-10-CM | POA: Diagnosis not present

## 2015-02-20 DIAGNOSIS — M1990 Unspecified osteoarthritis, unspecified site: Secondary | ICD-10-CM | POA: Diagnosis not present

## 2015-02-20 DIAGNOSIS — Z471 Aftercare following joint replacement surgery: Secondary | ICD-10-CM | POA: Diagnosis not present

## 2015-02-20 DIAGNOSIS — Z96651 Presence of right artificial knee joint: Secondary | ICD-10-CM | POA: Diagnosis not present

## 2015-02-20 DIAGNOSIS — I82622 Acute embolism and thrombosis of deep veins of left upper extremity: Secondary | ICD-10-CM | POA: Diagnosis not present

## 2015-02-20 DIAGNOSIS — D500 Iron deficiency anemia secondary to blood loss (chronic): Secondary | ICD-10-CM | POA: Diagnosis not present

## 2015-02-20 DIAGNOSIS — Z9181 History of falling: Secondary | ICD-10-CM | POA: Diagnosis not present

## 2015-02-20 DIAGNOSIS — M6281 Muscle weakness (generalized): Secondary | ICD-10-CM | POA: Diagnosis not present

## 2015-02-20 DIAGNOSIS — F0390 Unspecified dementia without behavioral disturbance: Secondary | ICD-10-CM | POA: Diagnosis not present

## 2015-02-20 DIAGNOSIS — E669 Obesity, unspecified: Secondary | ICD-10-CM | POA: Diagnosis not present

## 2015-02-20 DIAGNOSIS — I10 Essential (primary) hypertension: Secondary | ICD-10-CM | POA: Diagnosis not present

## 2015-02-23 DIAGNOSIS — Z96651 Presence of right artificial knee joint: Secondary | ICD-10-CM | POA: Diagnosis not present

## 2015-02-23 DIAGNOSIS — M069 Rheumatoid arthritis, unspecified: Secondary | ICD-10-CM | POA: Diagnosis not present

## 2015-02-23 DIAGNOSIS — D500 Iron deficiency anemia secondary to blood loss (chronic): Secondary | ICD-10-CM | POA: Diagnosis not present

## 2015-02-23 DIAGNOSIS — I10 Essential (primary) hypertension: Secondary | ICD-10-CM | POA: Diagnosis not present

## 2015-02-23 DIAGNOSIS — I82622 Acute embolism and thrombosis of deep veins of left upper extremity: Secondary | ICD-10-CM | POA: Diagnosis not present

## 2015-02-23 DIAGNOSIS — Z9181 History of falling: Secondary | ICD-10-CM | POA: Diagnosis not present

## 2015-02-23 DIAGNOSIS — F329 Major depressive disorder, single episode, unspecified: Secondary | ICD-10-CM | POA: Diagnosis not present

## 2015-02-23 DIAGNOSIS — M6281 Muscle weakness (generalized): Secondary | ICD-10-CM | POA: Diagnosis not present

## 2015-02-23 DIAGNOSIS — Z471 Aftercare following joint replacement surgery: Secondary | ICD-10-CM | POA: Diagnosis not present

## 2015-02-23 DIAGNOSIS — E669 Obesity, unspecified: Secondary | ICD-10-CM | POA: Diagnosis not present

## 2015-02-23 DIAGNOSIS — E1140 Type 2 diabetes mellitus with diabetic neuropathy, unspecified: Secondary | ICD-10-CM | POA: Diagnosis not present

## 2015-02-23 DIAGNOSIS — F0390 Unspecified dementia without behavioral disturbance: Secondary | ICD-10-CM | POA: Diagnosis not present

## 2015-02-23 DIAGNOSIS — M1990 Unspecified osteoarthritis, unspecified site: Secondary | ICD-10-CM | POA: Diagnosis not present

## 2015-02-24 DIAGNOSIS — Z96651 Presence of right artificial knee joint: Secondary | ICD-10-CM | POA: Diagnosis not present

## 2015-02-24 DIAGNOSIS — D500 Iron deficiency anemia secondary to blood loss (chronic): Secondary | ICD-10-CM | POA: Diagnosis not present

## 2015-02-24 DIAGNOSIS — I10 Essential (primary) hypertension: Secondary | ICD-10-CM | POA: Diagnosis not present

## 2015-02-24 DIAGNOSIS — M069 Rheumatoid arthritis, unspecified: Secondary | ICD-10-CM | POA: Diagnosis not present

## 2015-02-24 DIAGNOSIS — I82622 Acute embolism and thrombosis of deep veins of left upper extremity: Secondary | ICD-10-CM | POA: Diagnosis not present

## 2015-02-24 DIAGNOSIS — F0390 Unspecified dementia without behavioral disturbance: Secondary | ICD-10-CM | POA: Diagnosis not present

## 2015-02-24 DIAGNOSIS — Z9181 History of falling: Secondary | ICD-10-CM | POA: Diagnosis not present

## 2015-02-24 DIAGNOSIS — F329 Major depressive disorder, single episode, unspecified: Secondary | ICD-10-CM | POA: Diagnosis not present

## 2015-02-24 DIAGNOSIS — E1140 Type 2 diabetes mellitus with diabetic neuropathy, unspecified: Secondary | ICD-10-CM | POA: Diagnosis not present

## 2015-02-24 DIAGNOSIS — M1990 Unspecified osteoarthritis, unspecified site: Secondary | ICD-10-CM | POA: Diagnosis not present

## 2015-02-24 DIAGNOSIS — M6281 Muscle weakness (generalized): Secondary | ICD-10-CM | POA: Diagnosis not present

## 2015-02-24 DIAGNOSIS — E669 Obesity, unspecified: Secondary | ICD-10-CM | POA: Diagnosis not present

## 2015-02-24 DIAGNOSIS — Z471 Aftercare following joint replacement surgery: Secondary | ICD-10-CM | POA: Diagnosis not present

## 2015-02-25 ENCOUNTER — Ambulatory Visit (INDEPENDENT_AMBULATORY_CARE_PROVIDER_SITE_OTHER): Payer: Medicare Other | Admitting: Podiatry

## 2015-02-25 ENCOUNTER — Encounter: Payer: Self-pay | Admitting: Podiatry

## 2015-02-25 VITALS — BP 128/67 | HR 98 | Resp 18

## 2015-02-25 DIAGNOSIS — F0390 Unspecified dementia without behavioral disturbance: Secondary | ICD-10-CM | POA: Diagnosis not present

## 2015-02-25 DIAGNOSIS — E1140 Type 2 diabetes mellitus with diabetic neuropathy, unspecified: Secondary | ICD-10-CM | POA: Diagnosis not present

## 2015-02-25 DIAGNOSIS — M069 Rheumatoid arthritis, unspecified: Secondary | ICD-10-CM | POA: Diagnosis not present

## 2015-02-25 DIAGNOSIS — I10 Essential (primary) hypertension: Secondary | ICD-10-CM | POA: Diagnosis not present

## 2015-02-25 DIAGNOSIS — E1142 Type 2 diabetes mellitus with diabetic polyneuropathy: Secondary | ICD-10-CM

## 2015-02-25 DIAGNOSIS — I82622 Acute embolism and thrombosis of deep veins of left upper extremity: Secondary | ICD-10-CM | POA: Diagnosis not present

## 2015-02-25 DIAGNOSIS — Z9181 History of falling: Secondary | ICD-10-CM | POA: Diagnosis not present

## 2015-02-25 DIAGNOSIS — M79606 Pain in leg, unspecified: Secondary | ICD-10-CM

## 2015-02-25 DIAGNOSIS — M1990 Unspecified osteoarthritis, unspecified site: Secondary | ICD-10-CM | POA: Diagnosis not present

## 2015-02-25 DIAGNOSIS — M6281 Muscle weakness (generalized): Secondary | ICD-10-CM | POA: Diagnosis not present

## 2015-02-25 DIAGNOSIS — Z96651 Presence of right artificial knee joint: Secondary | ICD-10-CM | POA: Diagnosis not present

## 2015-02-25 DIAGNOSIS — D500 Iron deficiency anemia secondary to blood loss (chronic): Secondary | ICD-10-CM | POA: Diagnosis not present

## 2015-02-25 DIAGNOSIS — E669 Obesity, unspecified: Secondary | ICD-10-CM | POA: Diagnosis not present

## 2015-02-25 DIAGNOSIS — F329 Major depressive disorder, single episode, unspecified: Secondary | ICD-10-CM | POA: Diagnosis not present

## 2015-02-25 DIAGNOSIS — B351 Tinea unguium: Secondary | ICD-10-CM

## 2015-02-25 DIAGNOSIS — E119 Type 2 diabetes mellitus without complications: Secondary | ICD-10-CM

## 2015-02-25 DIAGNOSIS — G629 Polyneuropathy, unspecified: Secondary | ICD-10-CM

## 2015-02-25 DIAGNOSIS — Z471 Aftercare following joint replacement surgery: Secondary | ICD-10-CM | POA: Diagnosis not present

## 2015-02-25 NOTE — Progress Notes (Signed)
Patient ID: Andrew Winters, male   DOB: [birth year], 74 y.o.   MRN: [id number] Complaint:  Visit Type: Patient returns to my office for continued preventative foot care services. Complaint: Patient states" my nails have grown long and thick and become painful to walk and wear shoes" Patient has been diagnosed with DM with neuropathy.. The patient presents for preventative foot care services. No changes to ROS.  Patient recovering from multiple complications related to knee replacement right leg.  Podiatric Exam: Vascular: dorsalis pedis and posterior tibial pulses are weakly palpable bilateral. Capillary return is immediate. Temperature gradient is WNL. Moderate swelling both feet B/L.  Sensorium: Diminished  Semmes Weinstein monofilament test. Normal tactile sensation bilaterally. Nail Exam: Pt has thick disfigured discolored nails with subungual debris noted bilateral entire nail hallux through fifth toenails Ulcer Exam: There is no evidence of ulcer or pre-ulcerative changes or infection. Orthopedic Exam: Muscle tone and strength are WNL. No limitations in general ROM. No crepitus or effusions noted. Foot type and digits show no abnormalities. Bony prominences are unremarkable. Skin: No Porokeratosis. No infection or ulcers  Diagnosis:  Onychomycosis, , Pain in right toe, pain in left toes  Treatment & Plan Procedures and Treatment: Consent by patient was obtained for treatment procedures. The patient understood the discussion of treatment and procedures well. All questions were answered thoroughly reviewed. Debridement of mycotic and hypertrophic toenails, 1 through 5 bilateral and clearing of subungual debris. No ulceration, no infection noted.  Return Visit-Office Procedure: Patient instructed to return to the office for a follow up visit 3 months for continued evaluation and treatment.

## 2015-02-26 DIAGNOSIS — M069 Rheumatoid arthritis, unspecified: Secondary | ICD-10-CM | POA: Diagnosis not present

## 2015-02-26 DIAGNOSIS — E1140 Type 2 diabetes mellitus with diabetic neuropathy, unspecified: Secondary | ICD-10-CM | POA: Diagnosis not present

## 2015-02-26 DIAGNOSIS — I82622 Acute embolism and thrombosis of deep veins of left upper extremity: Secondary | ICD-10-CM | POA: Diagnosis not present

## 2015-02-26 DIAGNOSIS — M1990 Unspecified osteoarthritis, unspecified site: Secondary | ICD-10-CM | POA: Diagnosis not present

## 2015-02-26 DIAGNOSIS — Z96651 Presence of right artificial knee joint: Secondary | ICD-10-CM | POA: Diagnosis not present

## 2015-02-26 DIAGNOSIS — E669 Obesity, unspecified: Secondary | ICD-10-CM | POA: Diagnosis not present

## 2015-02-26 DIAGNOSIS — M6281 Muscle weakness (generalized): Secondary | ICD-10-CM | POA: Diagnosis not present

## 2015-02-26 DIAGNOSIS — Z471 Aftercare following joint replacement surgery: Secondary | ICD-10-CM | POA: Diagnosis not present

## 2015-02-26 DIAGNOSIS — Z9181 History of falling: Secondary | ICD-10-CM | POA: Diagnosis not present

## 2015-02-26 DIAGNOSIS — F0390 Unspecified dementia without behavioral disturbance: Secondary | ICD-10-CM | POA: Diagnosis not present

## 2015-02-26 DIAGNOSIS — I10 Essential (primary) hypertension: Secondary | ICD-10-CM | POA: Diagnosis not present

## 2015-02-26 DIAGNOSIS — F329 Major depressive disorder, single episode, unspecified: Secondary | ICD-10-CM | POA: Diagnosis not present

## 2015-02-26 DIAGNOSIS — D500 Iron deficiency anemia secondary to blood loss (chronic): Secondary | ICD-10-CM | POA: Diagnosis not present

## 2015-02-27 DIAGNOSIS — Z96651 Presence of right artificial knee joint: Secondary | ICD-10-CM | POA: Diagnosis not present

## 2015-02-27 DIAGNOSIS — M069 Rheumatoid arthritis, unspecified: Secondary | ICD-10-CM | POA: Diagnosis not present

## 2015-02-27 DIAGNOSIS — E669 Obesity, unspecified: Secondary | ICD-10-CM | POA: Diagnosis not present

## 2015-02-27 DIAGNOSIS — M6281 Muscle weakness (generalized): Secondary | ICD-10-CM | POA: Diagnosis not present

## 2015-02-27 DIAGNOSIS — M1990 Unspecified osteoarthritis, unspecified site: Secondary | ICD-10-CM | POA: Diagnosis not present

## 2015-02-27 DIAGNOSIS — D500 Iron deficiency anemia secondary to blood loss (chronic): Secondary | ICD-10-CM | POA: Diagnosis not present

## 2015-02-27 DIAGNOSIS — N390 Urinary tract infection, site not specified: Secondary | ICD-10-CM | POA: Diagnosis not present

## 2015-02-27 DIAGNOSIS — Z471 Aftercare following joint replacement surgery: Secondary | ICD-10-CM | POA: Diagnosis not present

## 2015-02-27 DIAGNOSIS — F329 Major depressive disorder, single episode, unspecified: Secondary | ICD-10-CM | POA: Diagnosis not present

## 2015-02-27 DIAGNOSIS — F0390 Unspecified dementia without behavioral disturbance: Secondary | ICD-10-CM | POA: Diagnosis not present

## 2015-02-27 DIAGNOSIS — Z9181 History of falling: Secondary | ICD-10-CM | POA: Diagnosis not present

## 2015-02-27 DIAGNOSIS — I10 Essential (primary) hypertension: Secondary | ICD-10-CM | POA: Diagnosis not present

## 2015-02-27 DIAGNOSIS — E1140 Type 2 diabetes mellitus with diabetic neuropathy, unspecified: Secondary | ICD-10-CM | POA: Diagnosis not present

## 2015-02-27 DIAGNOSIS — I82622 Acute embolism and thrombosis of deep veins of left upper extremity: Secondary | ICD-10-CM | POA: Diagnosis not present

## 2015-03-02 DIAGNOSIS — F329 Major depressive disorder, single episode, unspecified: Secondary | ICD-10-CM | POA: Diagnosis not present

## 2015-03-02 DIAGNOSIS — M069 Rheumatoid arthritis, unspecified: Secondary | ICD-10-CM | POA: Diagnosis not present

## 2015-03-02 DIAGNOSIS — M1990 Unspecified osteoarthritis, unspecified site: Secondary | ICD-10-CM | POA: Diagnosis not present

## 2015-03-02 DIAGNOSIS — Z9181 History of falling: Secondary | ICD-10-CM | POA: Diagnosis not present

## 2015-03-02 DIAGNOSIS — E669 Obesity, unspecified: Secondary | ICD-10-CM | POA: Diagnosis not present

## 2015-03-02 DIAGNOSIS — Z96651 Presence of right artificial knee joint: Secondary | ICD-10-CM | POA: Diagnosis not present

## 2015-03-02 DIAGNOSIS — I82622 Acute embolism and thrombosis of deep veins of left upper extremity: Secondary | ICD-10-CM | POA: Diagnosis not present

## 2015-03-02 DIAGNOSIS — E1140 Type 2 diabetes mellitus with diabetic neuropathy, unspecified: Secondary | ICD-10-CM | POA: Diagnosis not present

## 2015-03-02 DIAGNOSIS — F0390 Unspecified dementia without behavioral disturbance: Secondary | ICD-10-CM | POA: Diagnosis not present

## 2015-03-02 DIAGNOSIS — Z471 Aftercare following joint replacement surgery: Secondary | ICD-10-CM | POA: Diagnosis not present

## 2015-03-02 DIAGNOSIS — D500 Iron deficiency anemia secondary to blood loss (chronic): Secondary | ICD-10-CM | POA: Diagnosis not present

## 2015-03-02 DIAGNOSIS — M6281 Muscle weakness (generalized): Secondary | ICD-10-CM | POA: Diagnosis not present

## 2015-03-02 DIAGNOSIS — I10 Essential (primary) hypertension: Secondary | ICD-10-CM | POA: Diagnosis not present

## 2015-03-03 DIAGNOSIS — Z471 Aftercare following joint replacement surgery: Secondary | ICD-10-CM | POA: Diagnosis not present

## 2015-03-03 DIAGNOSIS — E1140 Type 2 diabetes mellitus with diabetic neuropathy, unspecified: Secondary | ICD-10-CM | POA: Diagnosis not present

## 2015-03-03 DIAGNOSIS — M6281 Muscle weakness (generalized): Secondary | ICD-10-CM | POA: Diagnosis not present

## 2015-03-03 DIAGNOSIS — I10 Essential (primary) hypertension: Secondary | ICD-10-CM | POA: Diagnosis not present

## 2015-03-03 DIAGNOSIS — E669 Obesity, unspecified: Secondary | ICD-10-CM | POA: Diagnosis not present

## 2015-03-03 DIAGNOSIS — M1990 Unspecified osteoarthritis, unspecified site: Secondary | ICD-10-CM | POA: Diagnosis not present

## 2015-03-03 DIAGNOSIS — Z9181 History of falling: Secondary | ICD-10-CM | POA: Diagnosis not present

## 2015-03-03 DIAGNOSIS — D500 Iron deficiency anemia secondary to blood loss (chronic): Secondary | ICD-10-CM | POA: Diagnosis not present

## 2015-03-03 DIAGNOSIS — F329 Major depressive disorder, single episode, unspecified: Secondary | ICD-10-CM | POA: Diagnosis not present

## 2015-03-03 DIAGNOSIS — Z96651 Presence of right artificial knee joint: Secondary | ICD-10-CM | POA: Diagnosis not present

## 2015-03-03 DIAGNOSIS — M069 Rheumatoid arthritis, unspecified: Secondary | ICD-10-CM | POA: Diagnosis not present

## 2015-03-03 DIAGNOSIS — I82622 Acute embolism and thrombosis of deep veins of left upper extremity: Secondary | ICD-10-CM | POA: Diagnosis not present

## 2015-03-03 DIAGNOSIS — F0390 Unspecified dementia without behavioral disturbance: Secondary | ICD-10-CM | POA: Diagnosis not present

## 2015-03-04 DIAGNOSIS — Z471 Aftercare following joint replacement surgery: Secondary | ICD-10-CM | POA: Diagnosis not present

## 2015-03-04 DIAGNOSIS — E1140 Type 2 diabetes mellitus with diabetic neuropathy, unspecified: Secondary | ICD-10-CM | POA: Diagnosis not present

## 2015-03-04 DIAGNOSIS — M6281 Muscle weakness (generalized): Secondary | ICD-10-CM | POA: Diagnosis not present

## 2015-03-04 DIAGNOSIS — Z9181 History of falling: Secondary | ICD-10-CM | POA: Diagnosis not present

## 2015-03-04 DIAGNOSIS — M1990 Unspecified osteoarthritis, unspecified site: Secondary | ICD-10-CM | POA: Diagnosis not present

## 2015-03-04 DIAGNOSIS — E669 Obesity, unspecified: Secondary | ICD-10-CM | POA: Diagnosis not present

## 2015-03-04 DIAGNOSIS — I10 Essential (primary) hypertension: Secondary | ICD-10-CM | POA: Diagnosis not present

## 2015-03-04 DIAGNOSIS — D500 Iron deficiency anemia secondary to blood loss (chronic): Secondary | ICD-10-CM | POA: Diagnosis not present

## 2015-03-04 DIAGNOSIS — F329 Major depressive disorder, single episode, unspecified: Secondary | ICD-10-CM | POA: Diagnosis not present

## 2015-03-04 DIAGNOSIS — Z96651 Presence of right artificial knee joint: Secondary | ICD-10-CM | POA: Diagnosis not present

## 2015-03-04 DIAGNOSIS — I82622 Acute embolism and thrombosis of deep veins of left upper extremity: Secondary | ICD-10-CM | POA: Diagnosis not present

## 2015-03-04 DIAGNOSIS — M069 Rheumatoid arthritis, unspecified: Secondary | ICD-10-CM | POA: Diagnosis not present

## 2015-03-04 DIAGNOSIS — F0390 Unspecified dementia without behavioral disturbance: Secondary | ICD-10-CM | POA: Diagnosis not present

## 2015-03-05 DIAGNOSIS — M6281 Muscle weakness (generalized): Secondary | ICD-10-CM | POA: Diagnosis not present

## 2015-03-05 DIAGNOSIS — Z471 Aftercare following joint replacement surgery: Secondary | ICD-10-CM | POA: Diagnosis not present

## 2015-03-05 DIAGNOSIS — F0390 Unspecified dementia without behavioral disturbance: Secondary | ICD-10-CM | POA: Diagnosis not present

## 2015-03-05 DIAGNOSIS — Z9181 History of falling: Secondary | ICD-10-CM | POA: Diagnosis not present

## 2015-03-05 DIAGNOSIS — F329 Major depressive disorder, single episode, unspecified: Secondary | ICD-10-CM | POA: Diagnosis not present

## 2015-03-05 DIAGNOSIS — I82622 Acute embolism and thrombosis of deep veins of left upper extremity: Secondary | ICD-10-CM | POA: Diagnosis not present

## 2015-03-05 DIAGNOSIS — E1140 Type 2 diabetes mellitus with diabetic neuropathy, unspecified: Secondary | ICD-10-CM | POA: Diagnosis not present

## 2015-03-05 DIAGNOSIS — E669 Obesity, unspecified: Secondary | ICD-10-CM | POA: Diagnosis not present

## 2015-03-05 DIAGNOSIS — Z96651 Presence of right artificial knee joint: Secondary | ICD-10-CM | POA: Diagnosis not present

## 2015-03-05 DIAGNOSIS — M069 Rheumatoid arthritis, unspecified: Secondary | ICD-10-CM | POA: Diagnosis not present

## 2015-03-05 DIAGNOSIS — M1990 Unspecified osteoarthritis, unspecified site: Secondary | ICD-10-CM | POA: Diagnosis not present

## 2015-03-05 DIAGNOSIS — D500 Iron deficiency anemia secondary to blood loss (chronic): Secondary | ICD-10-CM | POA: Diagnosis not present

## 2015-03-05 DIAGNOSIS — I10 Essential (primary) hypertension: Secondary | ICD-10-CM | POA: Diagnosis not present

## 2015-03-06 DIAGNOSIS — E669 Obesity, unspecified: Secondary | ICD-10-CM | POA: Diagnosis not present

## 2015-03-06 DIAGNOSIS — G8929 Other chronic pain: Secondary | ICD-10-CM | POA: Diagnosis not present

## 2015-03-06 DIAGNOSIS — M1990 Unspecified osteoarthritis, unspecified site: Secondary | ICD-10-CM | POA: Diagnosis not present

## 2015-03-06 DIAGNOSIS — I10 Essential (primary) hypertension: Secondary | ICD-10-CM | POA: Diagnosis not present

## 2015-03-06 DIAGNOSIS — I82622 Acute embolism and thrombosis of deep veins of left upper extremity: Secondary | ICD-10-CM | POA: Diagnosis not present

## 2015-03-06 DIAGNOSIS — Z471 Aftercare following joint replacement surgery: Secondary | ICD-10-CM | POA: Diagnosis not present

## 2015-03-06 DIAGNOSIS — F329 Major depressive disorder, single episode, unspecified: Secondary | ICD-10-CM | POA: Diagnosis not present

## 2015-03-06 DIAGNOSIS — Z96651 Presence of right artificial knee joint: Secondary | ICD-10-CM | POA: Diagnosis not present

## 2015-03-06 DIAGNOSIS — M6281 Muscle weakness (generalized): Secondary | ICD-10-CM | POA: Diagnosis not present

## 2015-03-06 DIAGNOSIS — D649 Anemia, unspecified: Secondary | ICD-10-CM | POA: Diagnosis not present

## 2015-03-06 DIAGNOSIS — Z9181 History of falling: Secondary | ICD-10-CM | POA: Diagnosis not present

## 2015-03-06 DIAGNOSIS — D500 Iron deficiency anemia secondary to blood loss (chronic): Secondary | ICD-10-CM | POA: Diagnosis not present

## 2015-03-06 DIAGNOSIS — M25561 Pain in right knee: Secondary | ICD-10-CM | POA: Diagnosis not present

## 2015-03-06 DIAGNOSIS — Z79899 Other long term (current) drug therapy: Secondary | ICD-10-CM | POA: Diagnosis not present

## 2015-03-06 DIAGNOSIS — F0390 Unspecified dementia without behavioral disturbance: Secondary | ICD-10-CM | POA: Diagnosis not present

## 2015-03-06 DIAGNOSIS — M069 Rheumatoid arthritis, unspecified: Secondary | ICD-10-CM | POA: Diagnosis not present

## 2015-03-06 DIAGNOSIS — E1140 Type 2 diabetes mellitus with diabetic neuropathy, unspecified: Secondary | ICD-10-CM | POA: Diagnosis not present

## 2015-03-09 DIAGNOSIS — Z96651 Presence of right artificial knee joint: Secondary | ICD-10-CM | POA: Diagnosis not present

## 2015-03-09 DIAGNOSIS — D500 Iron deficiency anemia secondary to blood loss (chronic): Secondary | ICD-10-CM | POA: Diagnosis not present

## 2015-03-09 DIAGNOSIS — E669 Obesity, unspecified: Secondary | ICD-10-CM | POA: Diagnosis not present

## 2015-03-09 DIAGNOSIS — I10 Essential (primary) hypertension: Secondary | ICD-10-CM | POA: Diagnosis not present

## 2015-03-09 DIAGNOSIS — Z9181 History of falling: Secondary | ICD-10-CM | POA: Diagnosis not present

## 2015-03-09 DIAGNOSIS — M1990 Unspecified osteoarthritis, unspecified site: Secondary | ICD-10-CM | POA: Diagnosis not present

## 2015-03-09 DIAGNOSIS — M069 Rheumatoid arthritis, unspecified: Secondary | ICD-10-CM | POA: Diagnosis not present

## 2015-03-09 DIAGNOSIS — F329 Major depressive disorder, single episode, unspecified: Secondary | ICD-10-CM | POA: Diagnosis not present

## 2015-03-09 DIAGNOSIS — E1140 Type 2 diabetes mellitus with diabetic neuropathy, unspecified: Secondary | ICD-10-CM | POA: Diagnosis not present

## 2015-03-09 DIAGNOSIS — Z471 Aftercare following joint replacement surgery: Secondary | ICD-10-CM | POA: Diagnosis not present

## 2015-03-09 DIAGNOSIS — I82622 Acute embolism and thrombosis of deep veins of left upper extremity: Secondary | ICD-10-CM | POA: Diagnosis not present

## 2015-03-09 DIAGNOSIS — F0390 Unspecified dementia without behavioral disturbance: Secondary | ICD-10-CM | POA: Diagnosis not present

## 2015-03-09 DIAGNOSIS — M6281 Muscle weakness (generalized): Secondary | ICD-10-CM | POA: Diagnosis not present

## 2015-03-10 DIAGNOSIS — Z471 Aftercare following joint replacement surgery: Secondary | ICD-10-CM | POA: Diagnosis not present

## 2015-03-10 DIAGNOSIS — Z9181 History of falling: Secondary | ICD-10-CM | POA: Diagnosis not present

## 2015-03-10 DIAGNOSIS — M069 Rheumatoid arthritis, unspecified: Secondary | ICD-10-CM | POA: Diagnosis not present

## 2015-03-10 DIAGNOSIS — I82622 Acute embolism and thrombosis of deep veins of left upper extremity: Secondary | ICD-10-CM | POA: Diagnosis not present

## 2015-03-10 DIAGNOSIS — D500 Iron deficiency anemia secondary to blood loss (chronic): Secondary | ICD-10-CM | POA: Diagnosis not present

## 2015-03-10 DIAGNOSIS — M6281 Muscle weakness (generalized): Secondary | ICD-10-CM | POA: Diagnosis not present

## 2015-03-10 DIAGNOSIS — F329 Major depressive disorder, single episode, unspecified: Secondary | ICD-10-CM | POA: Diagnosis not present

## 2015-03-10 DIAGNOSIS — E669 Obesity, unspecified: Secondary | ICD-10-CM | POA: Diagnosis not present

## 2015-03-10 DIAGNOSIS — I10 Essential (primary) hypertension: Secondary | ICD-10-CM | POA: Diagnosis not present

## 2015-03-10 DIAGNOSIS — F0390 Unspecified dementia without behavioral disturbance: Secondary | ICD-10-CM | POA: Diagnosis not present

## 2015-03-10 DIAGNOSIS — M1990 Unspecified osteoarthritis, unspecified site: Secondary | ICD-10-CM | POA: Diagnosis not present

## 2015-03-10 DIAGNOSIS — D649 Anemia, unspecified: Secondary | ICD-10-CM | POA: Diagnosis not present

## 2015-03-10 DIAGNOSIS — E1140 Type 2 diabetes mellitus with diabetic neuropathy, unspecified: Secondary | ICD-10-CM | POA: Diagnosis not present

## 2015-03-10 DIAGNOSIS — Z96651 Presence of right artificial knee joint: Secondary | ICD-10-CM | POA: Diagnosis not present

## 2015-03-11 ENCOUNTER — Encounter: Payer: Self-pay | Admitting: Gastroenterology

## 2015-03-11 DIAGNOSIS — I82622 Acute embolism and thrombosis of deep veins of left upper extremity: Secondary | ICD-10-CM | POA: Diagnosis not present

## 2015-03-11 DIAGNOSIS — I10 Essential (primary) hypertension: Secondary | ICD-10-CM | POA: Diagnosis not present

## 2015-03-11 DIAGNOSIS — Z9181 History of falling: Secondary | ICD-10-CM | POA: Diagnosis not present

## 2015-03-11 DIAGNOSIS — Z96651 Presence of right artificial knee joint: Secondary | ICD-10-CM | POA: Diagnosis not present

## 2015-03-11 DIAGNOSIS — E1140 Type 2 diabetes mellitus with diabetic neuropathy, unspecified: Secondary | ICD-10-CM | POA: Diagnosis not present

## 2015-03-11 DIAGNOSIS — F0390 Unspecified dementia without behavioral disturbance: Secondary | ICD-10-CM | POA: Diagnosis not present

## 2015-03-11 DIAGNOSIS — M6281 Muscle weakness (generalized): Secondary | ICD-10-CM | POA: Diagnosis not present

## 2015-03-11 DIAGNOSIS — F329 Major depressive disorder, single episode, unspecified: Secondary | ICD-10-CM | POA: Diagnosis not present

## 2015-03-11 DIAGNOSIS — M069 Rheumatoid arthritis, unspecified: Secondary | ICD-10-CM | POA: Diagnosis not present

## 2015-03-11 DIAGNOSIS — Z471 Aftercare following joint replacement surgery: Secondary | ICD-10-CM | POA: Diagnosis not present

## 2015-03-11 DIAGNOSIS — M1990 Unspecified osteoarthritis, unspecified site: Secondary | ICD-10-CM | POA: Diagnosis not present

## 2015-03-11 DIAGNOSIS — D500 Iron deficiency anemia secondary to blood loss (chronic): Secondary | ICD-10-CM | POA: Diagnosis not present

## 2015-03-11 DIAGNOSIS — E669 Obesity, unspecified: Secondary | ICD-10-CM | POA: Diagnosis not present

## 2015-03-13 DIAGNOSIS — Z471 Aftercare following joint replacement surgery: Secondary | ICD-10-CM | POA: Diagnosis not present

## 2015-03-13 DIAGNOSIS — Z9181 History of falling: Secondary | ICD-10-CM | POA: Diagnosis not present

## 2015-03-13 DIAGNOSIS — F0390 Unspecified dementia without behavioral disturbance: Secondary | ICD-10-CM | POA: Diagnosis not present

## 2015-03-13 DIAGNOSIS — I10 Essential (primary) hypertension: Secondary | ICD-10-CM | POA: Diagnosis not present

## 2015-03-13 DIAGNOSIS — I82622 Acute embolism and thrombosis of deep veins of left upper extremity: Secondary | ICD-10-CM | POA: Diagnosis not present

## 2015-03-13 DIAGNOSIS — E669 Obesity, unspecified: Secondary | ICD-10-CM | POA: Diagnosis not present

## 2015-03-13 DIAGNOSIS — M1990 Unspecified osteoarthritis, unspecified site: Secondary | ICD-10-CM | POA: Diagnosis not present

## 2015-03-13 DIAGNOSIS — Z96651 Presence of right artificial knee joint: Secondary | ICD-10-CM | POA: Diagnosis not present

## 2015-03-13 DIAGNOSIS — D500 Iron deficiency anemia secondary to blood loss (chronic): Secondary | ICD-10-CM | POA: Diagnosis not present

## 2015-03-13 DIAGNOSIS — R1030 Lower abdominal pain, unspecified: Secondary | ICD-10-CM | POA: Diagnosis not present

## 2015-03-13 DIAGNOSIS — M6281 Muscle weakness (generalized): Secondary | ICD-10-CM | POA: Diagnosis not present

## 2015-03-13 DIAGNOSIS — E1140 Type 2 diabetes mellitus with diabetic neuropathy, unspecified: Secondary | ICD-10-CM | POA: Diagnosis not present

## 2015-03-13 DIAGNOSIS — F329 Major depressive disorder, single episode, unspecified: Secondary | ICD-10-CM | POA: Diagnosis not present

## 2015-03-13 DIAGNOSIS — M069 Rheumatoid arthritis, unspecified: Secondary | ICD-10-CM | POA: Diagnosis not present

## 2015-03-16 DIAGNOSIS — E669 Obesity, unspecified: Secondary | ICD-10-CM | POA: Diagnosis not present

## 2015-03-16 DIAGNOSIS — M1990 Unspecified osteoarthritis, unspecified site: Secondary | ICD-10-CM | POA: Diagnosis not present

## 2015-03-16 DIAGNOSIS — E1140 Type 2 diabetes mellitus with diabetic neuropathy, unspecified: Secondary | ICD-10-CM | POA: Diagnosis not present

## 2015-03-16 DIAGNOSIS — F0390 Unspecified dementia without behavioral disturbance: Secondary | ICD-10-CM | POA: Diagnosis not present

## 2015-03-16 DIAGNOSIS — Z96651 Presence of right artificial knee joint: Secondary | ICD-10-CM | POA: Diagnosis not present

## 2015-03-16 DIAGNOSIS — I10 Essential (primary) hypertension: Secondary | ICD-10-CM | POA: Diagnosis not present

## 2015-03-16 DIAGNOSIS — D500 Iron deficiency anemia secondary to blood loss (chronic): Secondary | ICD-10-CM | POA: Diagnosis not present

## 2015-03-16 DIAGNOSIS — M6281 Muscle weakness (generalized): Secondary | ICD-10-CM | POA: Diagnosis not present

## 2015-03-16 DIAGNOSIS — Z471 Aftercare following joint replacement surgery: Secondary | ICD-10-CM | POA: Diagnosis not present

## 2015-03-16 DIAGNOSIS — I82622 Acute embolism and thrombosis of deep veins of left upper extremity: Secondary | ICD-10-CM | POA: Diagnosis not present

## 2015-03-16 DIAGNOSIS — Z9181 History of falling: Secondary | ICD-10-CM | POA: Diagnosis not present

## 2015-03-16 DIAGNOSIS — F329 Major depressive disorder, single episode, unspecified: Secondary | ICD-10-CM | POA: Diagnosis not present

## 2015-03-16 DIAGNOSIS — M069 Rheumatoid arthritis, unspecified: Secondary | ICD-10-CM | POA: Diagnosis not present

## 2015-03-17 DIAGNOSIS — R1030 Lower abdominal pain, unspecified: Secondary | ICD-10-CM | POA: Diagnosis not present

## 2015-03-17 DIAGNOSIS — D500 Iron deficiency anemia secondary to blood loss (chronic): Secondary | ICD-10-CM | POA: Diagnosis not present

## 2015-03-18 DIAGNOSIS — E669 Obesity, unspecified: Secondary | ICD-10-CM | POA: Diagnosis not present

## 2015-03-18 DIAGNOSIS — Z9181 History of falling: Secondary | ICD-10-CM | POA: Diagnosis not present

## 2015-03-18 DIAGNOSIS — M6281 Muscle weakness (generalized): Secondary | ICD-10-CM | POA: Diagnosis not present

## 2015-03-18 DIAGNOSIS — D500 Iron deficiency anemia secondary to blood loss (chronic): Secondary | ICD-10-CM | POA: Diagnosis not present

## 2015-03-18 DIAGNOSIS — M069 Rheumatoid arthritis, unspecified: Secondary | ICD-10-CM | POA: Diagnosis not present

## 2015-03-18 DIAGNOSIS — I82622 Acute embolism and thrombosis of deep veins of left upper extremity: Secondary | ICD-10-CM | POA: Diagnosis not present

## 2015-03-18 DIAGNOSIS — I10 Essential (primary) hypertension: Secondary | ICD-10-CM | POA: Diagnosis not present

## 2015-03-18 DIAGNOSIS — F0390 Unspecified dementia without behavioral disturbance: Secondary | ICD-10-CM | POA: Diagnosis not present

## 2015-03-18 DIAGNOSIS — Z471 Aftercare following joint replacement surgery: Secondary | ICD-10-CM | POA: Diagnosis not present

## 2015-03-18 DIAGNOSIS — F329 Major depressive disorder, single episode, unspecified: Secondary | ICD-10-CM | POA: Diagnosis not present

## 2015-03-18 DIAGNOSIS — E1140 Type 2 diabetes mellitus with diabetic neuropathy, unspecified: Secondary | ICD-10-CM | POA: Diagnosis not present

## 2015-03-18 DIAGNOSIS — Z96651 Presence of right artificial knee joint: Secondary | ICD-10-CM | POA: Diagnosis not present

## 2015-03-18 DIAGNOSIS — M1990 Unspecified osteoarthritis, unspecified site: Secondary | ICD-10-CM | POA: Diagnosis not present

## 2015-03-20 DIAGNOSIS — M6281 Muscle weakness (generalized): Secondary | ICD-10-CM | POA: Diagnosis not present

## 2015-03-20 DIAGNOSIS — E669 Obesity, unspecified: Secondary | ICD-10-CM | POA: Diagnosis not present

## 2015-03-20 DIAGNOSIS — I10 Essential (primary) hypertension: Secondary | ICD-10-CM | POA: Diagnosis not present

## 2015-03-20 DIAGNOSIS — D500 Iron deficiency anemia secondary to blood loss (chronic): Secondary | ICD-10-CM | POA: Diagnosis not present

## 2015-03-20 DIAGNOSIS — Z471 Aftercare following joint replacement surgery: Secondary | ICD-10-CM | POA: Diagnosis not present

## 2015-03-20 DIAGNOSIS — M069 Rheumatoid arthritis, unspecified: Secondary | ICD-10-CM | POA: Diagnosis not present

## 2015-03-20 DIAGNOSIS — F329 Major depressive disorder, single episode, unspecified: Secondary | ICD-10-CM | POA: Diagnosis not present

## 2015-03-20 DIAGNOSIS — M1990 Unspecified osteoarthritis, unspecified site: Secondary | ICD-10-CM | POA: Diagnosis not present

## 2015-03-20 DIAGNOSIS — E1140 Type 2 diabetes mellitus with diabetic neuropathy, unspecified: Secondary | ICD-10-CM | POA: Diagnosis not present

## 2015-03-20 DIAGNOSIS — Z9181 History of falling: Secondary | ICD-10-CM | POA: Diagnosis not present

## 2015-03-20 DIAGNOSIS — F0390 Unspecified dementia without behavioral disturbance: Secondary | ICD-10-CM | POA: Diagnosis not present

## 2015-03-20 DIAGNOSIS — I82622 Acute embolism and thrombosis of deep veins of left upper extremity: Secondary | ICD-10-CM | POA: Diagnosis not present

## 2015-03-20 DIAGNOSIS — Z96651 Presence of right artificial knee joint: Secondary | ICD-10-CM | POA: Diagnosis not present

## 2015-03-23 DIAGNOSIS — E1140 Type 2 diabetes mellitus with diabetic neuropathy, unspecified: Secondary | ICD-10-CM | POA: Diagnosis not present

## 2015-03-23 DIAGNOSIS — F0390 Unspecified dementia without behavioral disturbance: Secondary | ICD-10-CM | POA: Diagnosis not present

## 2015-03-23 DIAGNOSIS — D500 Iron deficiency anemia secondary to blood loss (chronic): Secondary | ICD-10-CM | POA: Diagnosis not present

## 2015-03-23 DIAGNOSIS — Z23 Encounter for immunization: Secondary | ICD-10-CM | POA: Diagnosis not present

## 2015-03-23 DIAGNOSIS — Z96651 Presence of right artificial knee joint: Secondary | ICD-10-CM | POA: Diagnosis not present

## 2015-03-23 DIAGNOSIS — Z471 Aftercare following joint replacement surgery: Secondary | ICD-10-CM | POA: Diagnosis not present

## 2015-03-23 DIAGNOSIS — M069 Rheumatoid arthritis, unspecified: Secondary | ICD-10-CM | POA: Diagnosis not present

## 2015-03-23 DIAGNOSIS — Z9181 History of falling: Secondary | ICD-10-CM | POA: Diagnosis not present

## 2015-03-23 DIAGNOSIS — E669 Obesity, unspecified: Secondary | ICD-10-CM | POA: Diagnosis not present

## 2015-03-23 DIAGNOSIS — I10 Essential (primary) hypertension: Secondary | ICD-10-CM | POA: Diagnosis not present

## 2015-03-23 DIAGNOSIS — M6281 Muscle weakness (generalized): Secondary | ICD-10-CM | POA: Diagnosis not present

## 2015-03-23 DIAGNOSIS — M1990 Unspecified osteoarthritis, unspecified site: Secondary | ICD-10-CM | POA: Diagnosis not present

## 2015-03-23 DIAGNOSIS — F329 Major depressive disorder, single episode, unspecified: Secondary | ICD-10-CM | POA: Diagnosis not present

## 2015-03-23 DIAGNOSIS — I82622 Acute embolism and thrombosis of deep veins of left upper extremity: Secondary | ICD-10-CM | POA: Diagnosis not present

## 2015-03-25 DIAGNOSIS — Z9181 History of falling: Secondary | ICD-10-CM | POA: Diagnosis not present

## 2015-03-25 DIAGNOSIS — F0390 Unspecified dementia without behavioral disturbance: Secondary | ICD-10-CM | POA: Diagnosis not present

## 2015-03-25 DIAGNOSIS — Z471 Aftercare following joint replacement surgery: Secondary | ICD-10-CM | POA: Diagnosis not present

## 2015-03-25 DIAGNOSIS — I10 Essential (primary) hypertension: Secondary | ICD-10-CM | POA: Diagnosis not present

## 2015-03-25 DIAGNOSIS — I82622 Acute embolism and thrombosis of deep veins of left upper extremity: Secondary | ICD-10-CM | POA: Diagnosis not present

## 2015-03-25 DIAGNOSIS — D500 Iron deficiency anemia secondary to blood loss (chronic): Secondary | ICD-10-CM | POA: Diagnosis not present

## 2015-03-25 DIAGNOSIS — M6281 Muscle weakness (generalized): Secondary | ICD-10-CM | POA: Diagnosis not present

## 2015-03-25 DIAGNOSIS — E669 Obesity, unspecified: Secondary | ICD-10-CM | POA: Diagnosis not present

## 2015-03-25 DIAGNOSIS — E1140 Type 2 diabetes mellitus with diabetic neuropathy, unspecified: Secondary | ICD-10-CM | POA: Diagnosis not present

## 2015-03-25 DIAGNOSIS — M069 Rheumatoid arthritis, unspecified: Secondary | ICD-10-CM | POA: Diagnosis not present

## 2015-03-25 DIAGNOSIS — F329 Major depressive disorder, single episode, unspecified: Secondary | ICD-10-CM | POA: Diagnosis not present

## 2015-03-25 DIAGNOSIS — M1990 Unspecified osteoarthritis, unspecified site: Secondary | ICD-10-CM | POA: Diagnosis not present

## 2015-03-25 DIAGNOSIS — Z96651 Presence of right artificial knee joint: Secondary | ICD-10-CM | POA: Diagnosis not present

## 2015-03-26 DIAGNOSIS — R0602 Shortness of breath: Secondary | ICD-10-CM | POA: Diagnosis not present

## 2015-03-27 DIAGNOSIS — F0390 Unspecified dementia without behavioral disturbance: Secondary | ICD-10-CM | POA: Diagnosis not present

## 2015-03-27 DIAGNOSIS — Z471 Aftercare following joint replacement surgery: Secondary | ICD-10-CM | POA: Diagnosis not present

## 2015-03-27 DIAGNOSIS — M1990 Unspecified osteoarthritis, unspecified site: Secondary | ICD-10-CM | POA: Diagnosis not present

## 2015-03-27 DIAGNOSIS — E1140 Type 2 diabetes mellitus with diabetic neuropathy, unspecified: Secondary | ICD-10-CM | POA: Diagnosis not present

## 2015-03-27 DIAGNOSIS — M069 Rheumatoid arthritis, unspecified: Secondary | ICD-10-CM | POA: Diagnosis not present

## 2015-03-27 DIAGNOSIS — F329 Major depressive disorder, single episode, unspecified: Secondary | ICD-10-CM | POA: Diagnosis not present

## 2015-03-27 DIAGNOSIS — D500 Iron deficiency anemia secondary to blood loss (chronic): Secondary | ICD-10-CM | POA: Diagnosis not present

## 2015-03-27 DIAGNOSIS — I10 Essential (primary) hypertension: Secondary | ICD-10-CM | POA: Diagnosis not present

## 2015-03-27 DIAGNOSIS — Z96651 Presence of right artificial knee joint: Secondary | ICD-10-CM | POA: Diagnosis not present

## 2015-03-27 DIAGNOSIS — I82622 Acute embolism and thrombosis of deep veins of left upper extremity: Secondary | ICD-10-CM | POA: Diagnosis not present

## 2015-03-27 DIAGNOSIS — Z9181 History of falling: Secondary | ICD-10-CM | POA: Diagnosis not present

## 2015-03-27 DIAGNOSIS — E669 Obesity, unspecified: Secondary | ICD-10-CM | POA: Diagnosis not present

## 2015-03-27 DIAGNOSIS — M6281 Muscle weakness (generalized): Secondary | ICD-10-CM | POA: Diagnosis not present

## 2015-03-30 DIAGNOSIS — M25561 Pain in right knee: Secondary | ICD-10-CM | POA: Diagnosis not present

## 2015-03-30 DIAGNOSIS — R262 Difficulty in walking, not elsewhere classified: Secondary | ICD-10-CM | POA: Diagnosis not present

## 2015-03-30 DIAGNOSIS — Z96659 Presence of unspecified artificial knee joint: Secondary | ICD-10-CM | POA: Diagnosis not present

## 2015-04-01 DIAGNOSIS — Z96659 Presence of unspecified artificial knee joint: Secondary | ICD-10-CM | POA: Diagnosis not present

## 2015-04-01 DIAGNOSIS — R262 Difficulty in walking, not elsewhere classified: Secondary | ICD-10-CM | POA: Diagnosis not present

## 2015-04-01 DIAGNOSIS — M25561 Pain in right knee: Secondary | ICD-10-CM | POA: Diagnosis not present

## 2015-04-03 DIAGNOSIS — R262 Difficulty in walking, not elsewhere classified: Secondary | ICD-10-CM | POA: Diagnosis not present

## 2015-04-03 DIAGNOSIS — Z96659 Presence of unspecified artificial knee joint: Secondary | ICD-10-CM | POA: Diagnosis not present

## 2015-04-03 DIAGNOSIS — M25561 Pain in right knee: Secondary | ICD-10-CM | POA: Diagnosis not present

## 2015-04-06 DIAGNOSIS — R262 Difficulty in walking, not elsewhere classified: Secondary | ICD-10-CM | POA: Diagnosis not present

## 2015-04-06 DIAGNOSIS — M25561 Pain in right knee: Secondary | ICD-10-CM | POA: Diagnosis not present

## 2015-04-06 DIAGNOSIS — Z96659 Presence of unspecified artificial knee joint: Secondary | ICD-10-CM | POA: Diagnosis not present

## 2015-04-08 DIAGNOSIS — Z96651 Presence of right artificial knee joint: Secondary | ICD-10-CM | POA: Diagnosis not present

## 2015-04-08 DIAGNOSIS — R262 Difficulty in walking, not elsewhere classified: Secondary | ICD-10-CM | POA: Diagnosis not present

## 2015-04-08 DIAGNOSIS — Z09 Encounter for follow-up examination after completed treatment for conditions other than malignant neoplasm: Secondary | ICD-10-CM | POA: Diagnosis not present

## 2015-04-08 DIAGNOSIS — M25561 Pain in right knee: Secondary | ICD-10-CM | POA: Diagnosis not present

## 2015-04-08 DIAGNOSIS — Z96659 Presence of unspecified artificial knee joint: Secondary | ICD-10-CM | POA: Diagnosis not present

## 2015-04-10 DIAGNOSIS — M25561 Pain in right knee: Secondary | ICD-10-CM | POA: Diagnosis not present

## 2015-04-10 DIAGNOSIS — R262 Difficulty in walking, not elsewhere classified: Secondary | ICD-10-CM | POA: Diagnosis not present

## 2015-04-10 DIAGNOSIS — Z96659 Presence of unspecified artificial knee joint: Secondary | ICD-10-CM | POA: Diagnosis not present

## 2015-04-13 DIAGNOSIS — Z96659 Presence of unspecified artificial knee joint: Secondary | ICD-10-CM | POA: Diagnosis not present

## 2015-04-13 DIAGNOSIS — M25561 Pain in right knee: Secondary | ICD-10-CM | POA: Diagnosis not present

## 2015-04-13 DIAGNOSIS — R262 Difficulty in walking, not elsewhere classified: Secondary | ICD-10-CM | POA: Diagnosis not present

## 2015-04-14 DIAGNOSIS — E119 Type 2 diabetes mellitus without complications: Secondary | ICD-10-CM | POA: Diagnosis not present

## 2015-04-16 DIAGNOSIS — Z96659 Presence of unspecified artificial knee joint: Secondary | ICD-10-CM | POA: Diagnosis not present

## 2015-04-16 DIAGNOSIS — M25561 Pain in right knee: Secondary | ICD-10-CM | POA: Diagnosis not present

## 2015-04-16 DIAGNOSIS — R262 Difficulty in walking, not elsewhere classified: Secondary | ICD-10-CM | POA: Diagnosis not present

## 2015-04-20 DIAGNOSIS — Z96659 Presence of unspecified artificial knee joint: Secondary | ICD-10-CM | POA: Diagnosis not present

## 2015-04-20 DIAGNOSIS — M25561 Pain in right knee: Secondary | ICD-10-CM | POA: Diagnosis not present

## 2015-04-20 DIAGNOSIS — R262 Difficulty in walking, not elsewhere classified: Secondary | ICD-10-CM | POA: Diagnosis not present

## 2015-04-23 DIAGNOSIS — M25561 Pain in right knee: Secondary | ICD-10-CM | POA: Diagnosis not present

## 2015-04-23 DIAGNOSIS — Z96659 Presence of unspecified artificial knee joint: Secondary | ICD-10-CM | POA: Diagnosis not present

## 2015-04-23 DIAGNOSIS — R262 Difficulty in walking, not elsewhere classified: Secondary | ICD-10-CM | POA: Diagnosis not present

## 2015-04-28 DIAGNOSIS — M25561 Pain in right knee: Secondary | ICD-10-CM | POA: Diagnosis not present

## 2015-04-28 DIAGNOSIS — Z96659 Presence of unspecified artificial knee joint: Secondary | ICD-10-CM | POA: Diagnosis not present

## 2015-04-28 DIAGNOSIS — R262 Difficulty in walking, not elsewhere classified: Secondary | ICD-10-CM | POA: Diagnosis not present

## 2015-04-30 DIAGNOSIS — M25561 Pain in right knee: Secondary | ICD-10-CM | POA: Diagnosis not present

## 2015-04-30 DIAGNOSIS — R262 Difficulty in walking, not elsewhere classified: Secondary | ICD-10-CM | POA: Diagnosis not present

## 2015-04-30 DIAGNOSIS — Z96659 Presence of unspecified artificial knee joint: Secondary | ICD-10-CM | POA: Diagnosis not present

## 2015-05-04 ENCOUNTER — Ambulatory Visit: Payer: Medicare Other | Admitting: Gastroenterology

## 2015-05-04 DIAGNOSIS — D500 Iron deficiency anemia secondary to blood loss (chronic): Secondary | ICD-10-CM | POA: Diagnosis not present

## 2015-05-04 DIAGNOSIS — Z79899 Other long term (current) drug therapy: Secondary | ICD-10-CM | POA: Diagnosis not present

## 2015-05-04 DIAGNOSIS — G894 Chronic pain syndrome: Secondary | ICD-10-CM | POA: Diagnosis not present

## 2015-05-05 DIAGNOSIS — M25561 Pain in right knee: Secondary | ICD-10-CM | POA: Diagnosis not present

## 2015-05-05 DIAGNOSIS — Z96659 Presence of unspecified artificial knee joint: Secondary | ICD-10-CM | POA: Diagnosis not present

## 2015-05-05 DIAGNOSIS — R262 Difficulty in walking, not elsewhere classified: Secondary | ICD-10-CM | POA: Diagnosis not present

## 2015-05-11 DIAGNOSIS — M25561 Pain in right knee: Secondary | ICD-10-CM | POA: Diagnosis not present

## 2015-05-27 ENCOUNTER — Ambulatory Visit: Payer: Medicare Other | Admitting: Podiatry

## 2015-05-27 ENCOUNTER — Ambulatory Visit: Payer: Medicare Other | Admitting: Sports Medicine

## 2015-05-28 ENCOUNTER — Ambulatory Visit: Payer: Medicare Other | Admitting: Sports Medicine

## 2015-05-28 DIAGNOSIS — E1142 Type 2 diabetes mellitus with diabetic polyneuropathy: Secondary | ICD-10-CM | POA: Diagnosis not present

## 2015-05-28 DIAGNOSIS — T148 Other injury of unspecified body region: Secondary | ICD-10-CM | POA: Diagnosis not present

## 2015-06-09 DIAGNOSIS — R1310 Dysphagia, unspecified: Secondary | ICD-10-CM | POA: Diagnosis not present

## 2015-06-09 DIAGNOSIS — R1013 Epigastric pain: Secondary | ICD-10-CM | POA: Diagnosis not present

## 2015-06-09 DIAGNOSIS — K210 Gastro-esophageal reflux disease with esophagitis: Secondary | ICD-10-CM | POA: Diagnosis not present

## 2015-06-09 DIAGNOSIS — D509 Iron deficiency anemia, unspecified: Secondary | ICD-10-CM | POA: Diagnosis not present

## 2015-06-18 DIAGNOSIS — E1142 Type 2 diabetes mellitus with diabetic polyneuropathy: Secondary | ICD-10-CM | POA: Diagnosis not present

## 2015-06-18 DIAGNOSIS — T148 Other injury of unspecified body region: Secondary | ICD-10-CM | POA: Diagnosis not present

## 2015-06-22 DIAGNOSIS — E1140 Type 2 diabetes mellitus with diabetic neuropathy, unspecified: Secondary | ICD-10-CM | POA: Diagnosis not present

## 2015-06-22 DIAGNOSIS — R1310 Dysphagia, unspecified: Secondary | ICD-10-CM | POA: Diagnosis not present

## 2015-06-29 DIAGNOSIS — R1310 Dysphagia, unspecified: Secondary | ICD-10-CM | POA: Diagnosis not present

## 2015-06-29 DIAGNOSIS — K222 Esophageal obstruction: Secondary | ICD-10-CM | POA: Diagnosis not present

## 2015-06-30 DIAGNOSIS — R1310 Dysphagia, unspecified: Secondary | ICD-10-CM | POA: Diagnosis not present

## 2015-06-30 DIAGNOSIS — D649 Anemia, unspecified: Secondary | ICD-10-CM | POA: Diagnosis not present

## 2015-09-07 DIAGNOSIS — M170 Bilateral primary osteoarthritis of knee: Secondary | ICD-10-CM | POA: Diagnosis not present

## 2015-09-07 DIAGNOSIS — Z79899 Other long term (current) drug therapy: Secondary | ICD-10-CM | POA: Diagnosis not present

## 2015-09-07 DIAGNOSIS — Z5181 Encounter for therapeutic drug level monitoring: Secondary | ICD-10-CM | POA: Diagnosis not present

## 2015-09-07 DIAGNOSIS — R1310 Dysphagia, unspecified: Secondary | ICD-10-CM | POA: Diagnosis not present

## 2015-10-08 ENCOUNTER — Ambulatory Visit (INDEPENDENT_AMBULATORY_CARE_PROVIDER_SITE_OTHER): Payer: Medicare Other | Admitting: Family Medicine

## 2015-10-08 ENCOUNTER — Ambulatory Visit (INDEPENDENT_AMBULATORY_CARE_PROVIDER_SITE_OTHER): Payer: Medicare Other

## 2015-10-08 VITALS — BP 128/82 | HR 65 | Temp 97.8°F | Resp 18 | Ht 73.0 in | Wt 306.0 lb

## 2015-10-08 DIAGNOSIS — R350 Frequency of micturition: Secondary | ICD-10-CM

## 2015-10-08 DIAGNOSIS — R140 Abdominal distension (gaseous): Secondary | ICD-10-CM | POA: Diagnosis not present

## 2015-10-08 DIAGNOSIS — R197 Diarrhea, unspecified: Secondary | ICD-10-CM

## 2015-10-08 DIAGNOSIS — R1084 Generalized abdominal pain: Secondary | ICD-10-CM | POA: Diagnosis not present

## 2015-10-08 LAB — POCT URINALYSIS DIP (MANUAL ENTRY)
Bilirubin, UA: NEGATIVE
Glucose, UA: NEGATIVE
Ketones, POC UA: NEGATIVE
LEUKOCYTES UA: NEGATIVE
Nitrite, UA: NEGATIVE
PH UA: 6.5
Protein Ur, POC: NEGATIVE
RBC UA: NEGATIVE
Spec Grav, UA: 1.025
UROBILINOGEN UA: 1

## 2015-10-08 LAB — POCT CBC
GRANULOCYTE PERCENT: 50.3 % (ref 37–80)
HCT, POC: 37.1 % — AB (ref 43.5–53.7)
Hemoglobin: 12.5 g/dL — AB (ref 14.1–18.1)
Lymph, poc: 2.5 (ref 0.6–3.4)
MCH: 29.5 pg (ref 27–31.2)
MCHC: 33.8 g/dL (ref 31.8–35.4)
MCV: 87.4 fL (ref 80–97)
MID (cbc): 0.1 (ref 0–0.9)
MPV: 7.5 fL (ref 0–99.8)
PLATELET COUNT, POC: 162 10*3/uL (ref 142–424)
POC Granulocyte: 2.7 (ref 2–6.9)
POC LYMPH PERCENT: 47.4 %L (ref 10–50)
POC MID %: 2.3 %M (ref 0–12)
RBC: 4.25 M/uL — AB (ref 4.69–6.13)
RDW, POC: 17 %
WBC: 5.3 10*3/uL (ref 4.6–10.2)

## 2015-10-08 LAB — POC MICROSCOPIC URINALYSIS (UMFC)

## 2015-10-08 NOTE — Patient Instructions (Addendum)
IF you received an x-ray today, you will receive an invoice from [facility]. Please contact [facility] at [phone] with questions or concerns regarding your invoice.   IF you received labwork today, you will receive an invoice from [employer]. Please contact [facility] at [phone] with questions or concerns regarding your invoice.   Our billing staff will not be able to assist you with questions regarding bills from these companies.  You will be contacted with the lab results as soon as they are available. The fastest way to get your results is to activate your My Chart account. Instructions are located on the last page of this paperwork. If you have not heard from [place] regarding the results in 2 weeks, please contact this office.     Can check the stool testing for other infectious causes of diarrhea, but based on our discussion, you may actually have some constipation with some loose stool around the constipated area. You can restart the Colace once or twice a day to see if this helps symptoms, but if diarrhea is worsening with this treatment, stop Colace.  If you are not improving into Monday of next week, would recommend you call your primary care provider or gastroenterologist to be seen. If any fevers or worsening of symptoms sooner, return to clinic or emergency room.  Abdominal Pain, Adult Many things can cause abdominal pain. Usually, abdominal pain is not caused by a disease and will improve without treatment. It can often be observed and treated at home. Your health care provider will do a physical exam and possibly order blood tests and X-rays to help determine the seriousness of your pain. However, in many cases, more time must pass before a clear cause of the pain can be found. Before that point, your health care provider may not know if you need more testing or further treatment. HOME CARE INSTRUCTIONS Monitor your abdominal  pain for any changes. The following actions may help to alleviate any discomfort you are experiencing:  Only take over-the-counter or prescription medicines as directed by your health care provider.  Do not take laxatives unless directed to do so by your health care provider.  Try a clear liquid diet (broth, tea, or water) as directed by your health care provider. Slowly move to a bland diet as tolerated. SEEK MEDICAL CARE IF:  You have unexplained abdominal pain.  You have abdominal pain associated with nausea or diarrhea.  You have pain when you urinate or have a bowel movement.  You experience abdominal pain that wakes you in the night.  You have abdominal pain that is worsened or improved by eating food.  You have abdominal pain that is worsened with eating fatty foods.  You have a fever. SEEK IMMEDIATE MEDICAL CARE IF:  Your pain does not go away within 2 hours.  You keep throwing up (vomiting).  Your pain is felt only in portions of the abdomen, such as the right side or the left lower portion of the abdomen.  You pass bloody or black tarry stools. MAKE SURE YOU:  Understand these instructions.  Will watch your condition.  Will get help right away if you are not doing well or get worse.   This information is not intended to replace advice given to you by your health care provider. Make sure you discuss any questions you have with your health care provider.   Document Released: 04/06/2005 Document Revised: 03/18/2015 Document Reviewed: 03/06/2013 Elsevier Interactive Patient  Education 2016 [employer].  Diarrhea Diarrhea is frequent loose and watery bowel movements. It can cause you to feel weak and dehydrated. Dehydration can cause you to become tired and thirsty, have a dry mouth, and have decreased urination that often is dark yellow. Diarrhea is a sign of another problem, most often an infection that will not last long. In most cases, diarrhea typically lasts  2-3 days. However, it can last longer if it is a sign of something more serious. It is important to treat your diarrhea as directed by your caregiver to lessen or prevent future episodes of diarrhea. CAUSES  Some common causes include:  Gastrointestinal infections caused by viruses, bacteria, or parasites.  Food poisoning or food allergies.  Certain medicines, such as antibiotics, chemotherapy, and laxatives.  Artificial sweeteners and fructose.  Digestive disorders. HOME CARE INSTRUCTIONS  Ensure adequate fluid intake (hydration): Have 1 cup (8 oz) of fluid for each diarrhea episode. Avoid fluids that contain simple sugars or sports drinks, fruit juices, whole milk products, and sodas. Your urine should be clear or pale yellow if you are drinking enough fluids. Hydrate with an oral rehydration solution that you can purchase at pharmacies, retail stores, and online. You can prepare an oral rehydration solution at home by mixing the following ingredients together:   - tsp table salt.   tsp baking soda.   tsp salt substitute containing potassium chloride.  1  tablespoons sugar.  1 L (34 oz) of water.  Certain foods and beverages may increase the speed at which food moves through the gastrointestinal (GI) tract. These foods and beverages should be avoided and include:  Caffeinated and alcoholic beverages.  High-fiber foods, such as raw fruits and vegetables, nuts, seeds, and whole grain breads and cereals.  Foods and beverages sweetened with sugar alcohols, such as xylitol, sorbitol, and mannitol.  Some foods may be well tolerated and may help thicken stool including:  Starchy foods, such as rice, toast, pasta, low-sugar cereal, oatmeal, grits, baked potatoes, crackers, and bagels.  Bananas.  Applesauce.  Add probiotic-rich foods to help increase healthy bacteria in the GI tract, such as yogurt and fermented milk products.  Wash your hands well after each diarrhea  episode.  Only take over-the-counter or prescription medicines as directed by your caregiver.  Take a warm bath to relieve any burning or pain from frequent diarrhea episodes. SEEK IMMEDIATE MEDICAL CARE IF:   You are unable to keep fluids down.  You have persistent vomiting.  You have blood in your stool, or your stools are black and tarry.  You do not urinate in 6-8 hours, or there is only a small amount of very dark urine.  You have abdominal pain that increases or localizes.  You have weakness, dizziness, confusion, or light-headedness.  You have a severe headache.  Your diarrhea gets worse or does not get better.  You have a fever or persistent symptoms for more than 2-3 days.  You have a fever and your symptoms suddenly get worse. MAKE SURE YOU:   Understand these instructions.  Will watch your condition.  Will get help right away if you are not doing well or get worse.   This information is not intended to replace advice given to you by your health care provider. Make sure you discuss any questions you have with your health care provider.   Document Released: 06/17/2002 Document Revised: 07/18/2014 Document Reviewed: 03/04/2012 Elsevier Interactive Patient Education [employer]

## 2015-10-08 NOTE — Progress Notes (Addendum)
Subjective:  By signing my name below, I, Stann Ore, attest that this documentation has been prepared under the direction and in the presence of Meredith Staggers, MD. Electronically Signed: Stann Ore, Scribe. 10/08/2015 , 6:42 PM .  Patient was seen in Room 3 .   Patient ID: Andrew Winters, male    DOB: 1941/06/23, 75 y.o.   MRN: [id number] Chief Complaint  Patient presents with  . Diarrhea    x1 week   HPI Andrew Winters is a 75 y.o. male H/o obesity and DM, and multiple medical problems per problem list. He's on multiple medications. He presents tonight with diarrhea.  PCP is VAN EYK, Barbara Cower, MD  Patient states having urinary frequency and diarrhea with abdominal distention started about 2 weeks ago. He has a bowel movement about 4-5 times a day with some incontinence at night. He's taken imodium without relief. He's also had intermittent abdominal pain for a while now. He denies any measured fever, vomiting, blood in stool, or tarry stool. He denies recent use of antibiotics. He denies sick contact at home. He denies any recent foreign travel. His most recent hospitalization was in December 2016.   He had an endoscopy done by Dr. Chales Abrahams in December 2016. He also notes trouble swallowing and will have enlargement of throat done. His next appointment with his PCP is in a month. His PCP is aware of the ongoing abdominal pain, but not about the recent diarrhea episodes and urinary frequency.   DM His sugar ran low at 66 recently due to appetite loss with the diarrhea. He denies taking metformin.   Medications He denies any new medications. He denies taking colace at this time due to the diarrhea.   Patient Active Problem List   Diagnosis Date Noted  . Pseudogout of right knee 01/13/2015  . DVT of upper extremity (deep vein thrombosis): LEFT 01/11/2015  . Anemia, iron deficiency 01/11/2015  . Postoperative fever   . Fever 01/08/2015  . Diabetes mellitus type 2, controlled  (HCC) 01/07/2015  . Essential hypertension 01/07/2015  . SIRS (systemic inflammatory response syndrome) (HCC) 01/07/2015  . Acute encephalopathy   . Primary osteoarthritis of right knee 01/06/2015  . Diabetes (HCC) 01/06/2015  . Morbid obesity (HCC) 01/06/2015  . DIABETIC PERIPHERAL NEUROPATHY 01/05/2010  . ANKLE PAIN, LEFT 01/05/2010  . HEADACHE 05/07/2009  . UNSPECIFIED VITAMIN D DEFICIENCY 02/03/2009  . LIVER FUNCTION TESTS, ABNORMAL 07/19/2007  . HYPERTHYROIDISM 01/04/2007  . DIABETES MELLITUS, TYPE II 01/04/2007  . HYPERLIPIDEMIA 01/04/2007  . ERECTILE DYSFUNCTION 01/04/2007  . DEPRESSION 01/04/2007  . HYPERTENSION 01/04/2007  . GERD 01/04/2007  . BENIGN PROSTATIC HYPERTROPHY 01/04/2007  . OSTEOARTHRITIS 01/04/2007   Past Medical History  Diagnosis Date  . Bilateral swelling of feet   . Headaches, cluster   . Anxiety   . Fatigue   . Rheumatoid arthritis(714.0)   . Muscle pain   . Hiatal hernia   . Vitamin D deficiency     takes Vit D daily  . BPH (benign prostatic hyperplasia)     no meds needed per medical md  . Osteoarthritis   . Hyperthyroidism   . Deafness     in left ear   . Hyperlipemia     was on meds but hasnt been on anything in about 32months per MD  . Presbyesophagus   . Insomnia     takes Trazodone nightly  . Depression     takes Zoloft daily  . Dysphagia   . Hypertension  takes Amlodipine,HCTZ,and Lisinopril daily  . Pneumonia 3+ yrs ago  . Peripheral neuropathy (HCC)     takes Gabapentin daily  . Joint pain   . Joint swelling   . GERD (gastroesophageal reflux disease)     takes Omeprazole daily  . Chronic diarrhea   . Nocturia   . Diabetes mellitus     takes Actos and Amaryl daily as well as Onglyza  . Cataracts, bilateral     immature   Past Surgical History  Procedure Laterality Date  . Back surgery  1998  . Colonoscopy with esophagogastroduodenoscopy (egd) and esophageal dilation (ed)    . Total knee arthroplasty Right  01/06/2015    Procedure: TOTAL KNEE ARTHROPLASTY;  Surgeon: Marcene Corning, MD;  Location: [facility] OR;  Service: Orthopedics;  Laterality: Right;  . Irrigation and debridement knee Right 01/12/2015   Allergies  Allergen Reactions  . Aspirin Diarrhea   Prior to Admission medications   Medication Sig Start Date End Date Taking? Authorizing Provider  acetaminophen (TYLENOL) 500 MG tablet Take 500 mg by mouth every 6 (six) hours as needed for mild pain or moderate pain.   Yes Historical Provider, MD  amLODipine (NORVASC) 5 MG tablet Take 5 mg by mouth daily.     Yes Historical Provider, MD  atorvastatin (LIPITOR) 40 MG tablet  02/08/15  Yes Historical Provider, MD  Cholecalciferol (VITAMIN D) 1000 UNITS capsule Take 1,000 Units by mouth 2 (two) times daily.     Yes Historical Provider, MD  docusate sodium (COLACE) 100 MG capsule Take 1 capsule (100 mg total) by mouth 2 (two) times daily. 01/13/15  Yes Rodolph Bong, MD  donepezil (ARICEPT) 5 MG tablet Take 5 mg by mouth at bedtime.  01/30/14  Yes Historical Provider, MD  enoxaparin (LOVENOX) 150 MG/ML injection Inject 0.85 mLs (130 mg total) into the skin every 12 (twelve) hours. 01/14/15  Yes Elodia Florence, PA-C  ferrous sulfate 325 (65 FE) MG tablet Take 1 tablet (325 mg total) by mouth 3 (three) times daily with meals. 01/13/15  Yes Rodolph Bong, MD  fluticasone (FLONASE) 50 MCG/ACT nasal spray Place 1 spray into both nostrils daily.   Yes Historical Provider, MD  gabapentin (NEURONTIN) 100 MG capsule Take 100 mg by mouth 3 (three) times daily.     Yes Historical Provider, MD  glimepiride (AMARYL) 4 MG tablet Take 4 mg by mouth daily before breakfast.     Yes Historical Provider, MD  HYDROcodone-acetaminophen (NORCO/VICODIN) 5-325 MG per tablet  02/11/15  Yes Historical Provider, MD  indomethacin (INDOCIN SR) 75 MG CR capsule Take 1 capsule (75 mg total) by mouth 2 (two) times daily with a meal. Take for 3 days then as needed. 01/14/15  Yes Rodolph Bong, MD  lisinopril (PRINIVIL,ZESTRIL) 10 MG tablet  12/03/14  Yes Historical Provider, MD  lisinopril (PRINIVIL,ZESTRIL) 2.5 MG tablet Take 2.5 mg by mouth 2 (two) times daily.    Yes Historical Provider, MD  meloxicam (MOBIC) 15 MG tablet  12/01/14  Yes Historical Provider, MD  omeprazole (PRILOSEC) 20 MG capsule  01/29/15  Yes Historical Provider, MD  Omeprazole 20 MG TBEC Take 20 mg by mouth daily at 12 noon.    Yes Historical Provider, MD  ONGLYZA 2.5 MG TABS tablet Take 2.5 mg by mouth daily.  04/04/13  Yes Historical Provider, MD  pioglitazone (ACTOS) 15 MG tablet Take 15 mg by mouth daily.  10/31/13  Yes Historical Provider, MD  potassium chloride (K-DUR) 10  MEQ tablet  12/03/14  Yes Historical Provider, MD  sertraline (ZOLOFT) 25 MG tablet Take 25 mg by mouth daily.  01/09/11  Yes Historical Provider, MD  traMADol (ULTRAM) 50 MG tablet Take 1 tablet (50 mg total) by mouth every 6 (six) hours as needed for moderate pain or severe pain. 01/15/15  Yes Sharon Seller, NP  traZODone (DESYREL) 50 MG tablet Take 50 mg by mouth at bedtime. 11/03/14  Yes Historical Provider, MD  warfarin (COUMADIN) 4 MG tablet To take 9 mg coumadin daily- take 5 mg tablet with 4 mg tablet to equal 9 mg daily 01/26/15  Yes Sharon Seller, NP   Social History   Social History  . Marital Status: Married    Spouse Name: N/A  . Number of Children: 3  . Years of Education: N/A   Occupational History  . Retired     Social History Main Topics  . Smoking status: Former Smoker -- 1.00 packs/day for 35 years    Types: Cigarettes    Quit date: 10/15/2014  . Smokeless tobacco: Never Used     Comment: pack last about 2-3wks  . Alcohol Use: No  . Drug Use: No  . Sexual Activity: Not Currently   Other Topics Concern  . Not on file   Social History Narrative   Review of Systems  Constitutional: Negative for fever, chills and fatigue.  Gastrointestinal: Positive for abdominal pain and diarrhea. Negative for nausea,  vomiting, constipation, blood in stool and anal bleeding.  Genitourinary: Positive for frequency. Negative for dysuria and hematuria.  Neurological: Negative for dizziness and headaches.       Objective:   Physical Exam  Constitutional: He is oriented to person, place, and time. He appears well-developed and well-nourished. No distress.  HENT:  Head: Normocephalic and atraumatic.  Eyes: EOM are normal. Pupils are equal, round, and reactive to light.  Neck: Neck supple.  Cardiovascular: Normal rate, regular rhythm and normal heart sounds.   No murmur heard. Pulmonary/Chest: Effort normal. No respiratory distress.  Abdominal: He exhibits distension. There is generalized tenderness (diffuse).  Musculoskeletal: Normal range of motion.  Lower extremity edema bilaterally  Neurological: He is alert and oriented to person, place, and time.  Skin: Skin is warm and dry.  Psychiatric: He has a normal mood and affect. His behavior is normal.  Nursing note and vitals reviewed.   Filed Vitals:   10/08/15 1759  BP: 128/82  Pulse: 65  Temp: 97.8 F (36.6 C)  TempSrc: Oral  Resp: 18  Height: 6\' 1"  (1.854 m)  Weight: 306 lb (138.801 kg)  SpO2: 96%   Results for orders placed or performed in visit on 10/08/15  POCT urinalysis dipstick  Result Value Ref Range   Color, UA yellow yellow   Clarity, UA clear clear   Glucose, UA negative negative   Bilirubin, UA negative negative   Ketones, POC UA negative negative   Spec Grav, UA 1.025    Blood, UA negative negative   pH, UA 6.5    Protein Ur, POC negative negative   Urobilinogen, UA 1.0    Nitrite, UA Negative Negative   Leukocytes, UA Negative Negative  POCT Microscopic Urinalysis ([facility])  Result Value Ref Range   WBC,UR,HPF,POC None None WBC/hpf   RBC,UR,HPF,POC None None RBC/hpf   Bacteria None None, Too numerous to count   Mucus Present (A) Absent   Epithelial Cells, UR Per Microscopy None None, Too numerous to count cells/hpf  POCT CBC  Result Value Ref Range   WBC 5.3 4.6 - 10.2 K/uL   Lymph, poc 2.5 0.6 - 3.4   POC LYMPH PERCENT 47.4 10 - 50 %L   MID (cbc) 0.1 0 - 0.9   POC MID % 2.3 0 - 12 %M   POC Granulocyte 2.7 2 - 6.9   Granulocyte percent 50.3 37 - 80 %G   RBC 4.25 (A) 4.69 - 6.13 M/uL   Hemoglobin 12.5 (A) 14.1 - 18.1 g/dL   HCT, POC 03.5 (A) 00.9 - 53.7 %   MCV 87.4 80 - 97 fL   MCH, POC 29.5 27 - 31.2 pg   MCHC 33.8 31.8 - 35.4 g/dL   RDW, POC 38.1 %   Platelet Count, POC 162 142 - 424 K/uL   MPV 7.5 0 - 99.8 fL   Dg Abd Acute W/chest  10/08/2015  CLINICAL DATA:  Urinary frequency and abdominal distention or 2 weeks EXAM: DG ABDOMEN ACUTE W/ 1V CHEST COMPARISON:  None. FINDINGS: Cardiac shadow is at the upper limits of normal in size. The lungs are clear bilaterally. No focal infiltrate is seen. Scattered large and small bowel gas is noted. No obstructive changes are seen. Degenerative changes of the lumbar spine are noted. No free air is noted. IMPRESSION: No acute abnormality seen. Electronically Signed   By: Alcide Clever M.D.   On: 10/08/2015 19:50   Further history after discussion of results. Has had some hard stools with the diarrhea the past few weeks, and when having diarrhea, small amounts at a time. Possible leak around effects from constipation.     Assessment & Plan:  Andrew Winters is a 75 y.o. male Generalized abdominal pain - Plan: DG Abd Acute W/Chest, POCT CBC, Gastrointestinal Pathogen Panel PCR  Diarrhea, unspecified type - Plan: DG Abd Acute W/Chest, POCT CBC, Gastrointestinal Pathogen Panel PCR  Urinary frequency - Plan: POCT urinalysis dipstick, POCT Microscopic Urinalysis ([facility])  Approximately 2 week history of diarrhea with abdominal pain. On discussion he has had recurrent abdominal pain that has been evaluated by gastroenterology and his primary care provider, but new symptoms of diarrhea. Also further discussion, has had some hard stools along with the diarrhea, so  possible leak around effects of constipation. No acute findings on abdominal series. Reassuring CBC, with improved hemoglobin from previous readings. Reassuring urine testing, and long-standing frequency.  - Check GI pathogen panel.  -Can try restarting Colace once or twice per day to see if constipation causing his symptoms. If worsening diarrhea, stop Colace.  -Maintain hydration, monitor blood sugars at home, as if eating less, higher risk of hypoglycemia. Hypoglycemia precautions were discussed.  - RTC/ER precautions discussed.  No orders of the defined types were placed in this encounter.   Patient Instructions       IF you received an x-ray today, you will receive an invoice from [facility]. Please contact [facility] at [phone] with questions or concerns regarding your invoice.   IF you received labwork today, you will receive an invoice from [employer]. Please contact [facility] at [phone] with questions or concerns regarding your invoice.   Our billing staff will not be able to assist you with questions regarding bills from these companies.  You will be contacted with the lab results as soon as they are available. The fastest way to get your results is to activate your My Chart account. Instructions are located on the last page of this paperwork. If you  have not heard from [place] regarding the results in 2 weeks, please contact this office.     Can check the stool testing for other infectious causes of diarrhea, but based on our discussion, you may actually have some constipation with some loose stool around the constipated area. You can restart the Colace once or twice a day to see if this helps symptoms, but if diarrhea is worsening with this treatment, stop Colace.  If you are not improving into Monday of next week, would recommend you call your primary care provider or gastroenterologist to be seen. If any fevers or worsening of  symptoms sooner, return to clinic or emergency room.  Abdominal Pain, Adult Many things can cause abdominal pain. Usually, abdominal pain is not caused by a disease and will improve without treatment. It can often be observed and treated at home. Your health care provider will do a physical exam and possibly order blood tests and X-rays to help determine the seriousness of your pain. However, in many cases, more time must pass before a clear cause of the pain can be found. Before that point, your health care provider may not know if you need more testing or further treatment. HOME CARE INSTRUCTIONS Monitor your abdominal pain for any changes. The following actions may help to alleviate any discomfort you are experiencing:  Only take over-the-counter or prescription medicines as directed by your health care provider.  Do not take laxatives unless directed to do so by your health care provider.  Try a clear liquid diet (broth, tea, or water) as directed by your health care provider. Slowly move to a bland diet as tolerated. SEEK MEDICAL CARE IF:  You have unexplained abdominal pain.  You have abdominal pain associated with nausea or diarrhea.  You have pain when you urinate or have a bowel movement.  You experience abdominal pain that wakes you in the night.  You have abdominal pain that is worsened or improved by eating food.  You have abdominal pain that is worsened with eating fatty foods.  You have a fever. SEEK IMMEDIATE MEDICAL CARE IF:  Your pain does not go away within 2 hours.  You keep throwing up (vomiting).  Your pain is felt only in portions of the abdomen, such as the right side or the left lower portion of the abdomen.  You pass bloody or black tarry stools. MAKE SURE YOU:  Understand these instructions.  Will watch your condition.  Will get help right away if you are not doing well or get worse.   This information is not intended to replace advice given to  you by your health care provider. Make sure you discuss any questions you have with your health care provider.   Document Released: 04/06/2005 Document Revised: 03/18/2015 Document Reviewed: 03/06/2013 Elsevier Interactive Patient Education 2016 [employer] Inc[employer]  Diarrhea Diarrhea is frequent loose and watery bowel movements. It can cause you to feel weak and dehydrated. Dehydration can cause you to become tired and thirsty, have a dry mouth, and have decreased urination that often is dark yellow. Diarrhea is a sign of another problem, most often an infection that will not last long. In most cases, diarrhea typically lasts 2-3 days. However, it can last longer if it is a sign of something more serious. It is important to treat your diarrhea as directed by your caregiver to lessen or prevent future episodes of diarrhea. CAUSES  Some common causes include:  Gastrointestinal infections caused by viruses, bacteria, or parasites.  Food poisoning or food allergies.  Certain medicines, such as antibiotics, chemotherapy, and laxatives.  Artificial sweeteners and fructose.  Digestive disorders. HOME CARE INSTRUCTIONS  Ensure adequate fluid intake (hydration): Have 1 cup (8 oz) of fluid for each diarrhea episode. Avoid fluids that contain simple sugars or sports drinks, fruit juices, whole milk products, and sodas. Your urine should be clear or pale yellow if you are drinking enough fluids. Hydrate with an oral rehydration solution that you can purchase at pharmacies, retail stores, and online. You can prepare an oral rehydration solution at home by mixing the following ingredients together:   - tsp table salt.   tsp baking soda.   tsp salt substitute containing potassium chloride.  1  tablespoons sugar.  1 L (34 oz) of water.  Certain foods and beverages may increase the speed at which food moves through the gastrointestinal (GI) tract. These foods and beverages should be avoided and  include:  Caffeinated and alcoholic beverages.  High-fiber foods, such as raw fruits and vegetables, nuts, seeds, and whole grain breads and cereals.  Foods and beverages sweetened with sugar alcohols, such as xylitol, sorbitol, and mannitol.  Some foods may be well tolerated and may help thicken stool including:  Starchy foods, such as rice, toast, pasta, low-sugar cereal, oatmeal, grits, baked potatoes, crackers, and bagels.  Bananas.  Applesauce.  Add probiotic-rich foods to help increase healthy bacteria in the GI tract, such as yogurt and fermented milk products.  Wash your hands well after each diarrhea episode.  Only take over-the-counter or prescription medicines as directed by your caregiver.  Take a warm bath to relieve any burning or pain from frequent diarrhea episodes. SEEK IMMEDIATE MEDICAL CARE IF:   You are unable to keep fluids down.  You have persistent vomiting.  You have blood in your stool, or your stools are black and tarry.  You do not urinate in 6-8 hours, or there is only a small amount of very dark urine.  You have abdominal pain that increases or localizes.  You have weakness, dizziness, confusion, or light-headedness.  You have a severe headache.  Your diarrhea gets worse or does not get better.  You have a fever or persistent symptoms for more than 2-3 days.  You have a fever and your symptoms suddenly get worse. MAKE SURE YOU:   Understand these instructions.  Will watch your condition.  Will get help right away if you are not doing well or get worse.   This information is not intended to replace advice given to you by your health care provider. Make sure you discuss any questions you have with your health care provider.   Document Released: 06/17/2002 Document Revised: 07/18/2014 Document Reviewed: 03/04/2012 Elsevier Interactive Patient Education [employer]        I personally performed the services described in  this documentation, which was scribed in my presence. The recorded information has been reviewed and considered, and addended by me as needed.      Signed,   Meredith Staggers, MD [facility]

## 2015-10-13 DIAGNOSIS — R197 Diarrhea, unspecified: Secondary | ICD-10-CM | POA: Diagnosis not present

## 2015-10-13 DIAGNOSIS — R1084 Generalized abdominal pain: Secondary | ICD-10-CM | POA: Diagnosis not present

## 2015-10-15 LAB — GASTROINTESTINAL PATHOGEN PANEL PCR
C. difficile Tox A/B, PCR: NEGATIVE
Campylobacter, PCR: NEGATIVE
Cryptosporidium, PCR: NEGATIVE
E COLI (ETEC) LT/ST, PCR: NEGATIVE
E COLI (STEC) STX1/STX2, PCR: NEGATIVE
E COLI 0157, PCR: NEGATIVE
Giardia lamblia, PCR: NEGATIVE
Norovirus, PCR: NEGATIVE
Rotavirus A, PCR: NEGATIVE
SALMONELLA, PCR: NEGATIVE
Shigella, PCR: NEGATIVE

## 2015-10-29 DIAGNOSIS — E785 Hyperlipidemia, unspecified: Secondary | ICD-10-CM | POA: Diagnosis not present

## 2015-10-29 DIAGNOSIS — Z8673 Personal history of transient ischemic attack (TIA), and cerebral infarction without residual deficits: Secondary | ICD-10-CM | POA: Diagnosis not present

## 2015-10-29 DIAGNOSIS — K210 Gastro-esophageal reflux disease with esophagitis: Secondary | ICD-10-CM | POA: Diagnosis not present

## 2015-10-29 DIAGNOSIS — K219 Gastro-esophageal reflux disease without esophagitis: Secondary | ICD-10-CM | POA: Diagnosis not present

## 2015-10-29 DIAGNOSIS — K222 Esophageal obstruction: Secondary | ICD-10-CM | POA: Diagnosis not present

## 2015-10-29 DIAGNOSIS — G309 Alzheimer's disease, unspecified: Secondary | ICD-10-CM | POA: Diagnosis not present

## 2015-10-29 DIAGNOSIS — E119 Type 2 diabetes mellitus without complications: Secondary | ICD-10-CM | POA: Diagnosis not present

## 2015-10-29 DIAGNOSIS — M1990 Unspecified osteoarthritis, unspecified site: Secondary | ICD-10-CM | POA: Diagnosis not present

## 2015-10-29 DIAGNOSIS — R1013 Epigastric pain: Secondary | ICD-10-CM | POA: Diagnosis not present

## 2015-10-29 DIAGNOSIS — R1310 Dysphagia, unspecified: Secondary | ICD-10-CM | POA: Diagnosis not present

## 2015-10-29 DIAGNOSIS — Z79899 Other long term (current) drug therapy: Secondary | ICD-10-CM | POA: Diagnosis not present

## 2015-10-29 DIAGNOSIS — D509 Iron deficiency anemia, unspecified: Secondary | ICD-10-CM | POA: Diagnosis not present

## 2015-10-29 DIAGNOSIS — K449 Diaphragmatic hernia without obstruction or gangrene: Secondary | ICD-10-CM | POA: Diagnosis not present

## 2015-10-29 DIAGNOSIS — G629 Polyneuropathy, unspecified: Secondary | ICD-10-CM | POA: Diagnosis not present

## 2015-10-29 DIAGNOSIS — Z7984 Long term (current) use of oral hypoglycemic drugs: Secondary | ICD-10-CM | POA: Diagnosis not present

## 2015-10-29 DIAGNOSIS — N400 Enlarged prostate without lower urinary tract symptoms: Secondary | ICD-10-CM | POA: Diagnosis not present

## 2015-10-29 DIAGNOSIS — G4730 Sleep apnea, unspecified: Secondary | ICD-10-CM | POA: Diagnosis not present

## 2015-10-29 DIAGNOSIS — I10 Essential (primary) hypertension: Secondary | ICD-10-CM | POA: Diagnosis not present

## 2015-11-09 DIAGNOSIS — R0601 Orthopnea: Secondary | ICD-10-CM | POA: Diagnosis not present

## 2015-11-09 DIAGNOSIS — R609 Edema, unspecified: Secondary | ICD-10-CM | POA: Diagnosis not present

## 2015-11-11 DIAGNOSIS — R600 Localized edema: Secondary | ICD-10-CM | POA: Diagnosis not present

## 2015-11-13 DIAGNOSIS — M19012 Primary osteoarthritis, left shoulder: Secondary | ICD-10-CM | POA: Diagnosis not present

## 2015-11-13 DIAGNOSIS — R0683 Snoring: Secondary | ICD-10-CM | POA: Diagnosis not present

## 2015-11-13 DIAGNOSIS — I872 Venous insufficiency (chronic) (peripheral): Secondary | ICD-10-CM | POA: Diagnosis not present

## 2015-11-17 DIAGNOSIS — R9431 Abnormal electrocardiogram [ECG] [EKG]: Secondary | ICD-10-CM | POA: Diagnosis not present

## 2015-11-26 ENCOUNTER — Other Ambulatory Visit: Payer: Self-pay | Admitting: Nurse Practitioner

## 2015-11-27 DIAGNOSIS — R0683 Snoring: Secondary | ICD-10-CM | POA: Diagnosis not present

## 2015-11-30 ENCOUNTER — Other Ambulatory Visit: Payer: Self-pay | Admitting: Nurse Practitioner

## 2015-12-02 DIAGNOSIS — M19012 Primary osteoarthritis, left shoulder: Secondary | ICD-10-CM | POA: Diagnosis not present

## 2015-12-11 DIAGNOSIS — R1314 Dysphagia, pharyngoesophageal phase: Secondary | ICD-10-CM | POA: Diagnosis not present

## 2015-12-14 DIAGNOSIS — M19011 Primary osteoarthritis, right shoulder: Secondary | ICD-10-CM | POA: Diagnosis not present

## 2015-12-14 DIAGNOSIS — E7800 Pure hypercholesterolemia, unspecified: Secondary | ICD-10-CM | POA: Diagnosis not present

## 2015-12-14 DIAGNOSIS — Z5181 Encounter for therapeutic drug level monitoring: Secondary | ICD-10-CM | POA: Diagnosis not present

## 2015-12-14 DIAGNOSIS — N400 Enlarged prostate without lower urinary tract symptoms: Secondary | ICD-10-CM | POA: Diagnosis not present

## 2015-12-14 DIAGNOSIS — Z0000 Encounter for general adult medical examination without abnormal findings: Secondary | ICD-10-CM | POA: Diagnosis not present

## 2015-12-14 DIAGNOSIS — I10 Essential (primary) hypertension: Secondary | ICD-10-CM | POA: Diagnosis not present

## 2015-12-14 DIAGNOSIS — E1140 Type 2 diabetes mellitus with diabetic neuropathy, unspecified: Secondary | ICD-10-CM | POA: Diagnosis not present

## 2015-12-29 DIAGNOSIS — B351 Tinea unguium: Secondary | ICD-10-CM | POA: Diagnosis not present

## 2015-12-29 DIAGNOSIS — E119 Type 2 diabetes mellitus without complications: Secondary | ICD-10-CM | POA: Diagnosis not present

## 2015-12-30 DIAGNOSIS — M19012 Primary osteoarthritis, left shoulder: Secondary | ICD-10-CM | POA: Diagnosis not present

## 2016-01-21 DIAGNOSIS — K228 Other specified diseases of esophagus: Secondary | ICD-10-CM | POA: Diagnosis not present

## 2016-01-21 DIAGNOSIS — K219 Gastro-esophageal reflux disease without esophagitis: Secondary | ICD-10-CM | POA: Diagnosis not present

## 2016-01-21 DIAGNOSIS — G309 Alzheimer's disease, unspecified: Secondary | ICD-10-CM | POA: Diagnosis not present

## 2016-01-21 DIAGNOSIS — E785 Hyperlipidemia, unspecified: Secondary | ICD-10-CM | POA: Diagnosis not present

## 2016-01-21 DIAGNOSIS — E119 Type 2 diabetes mellitus without complications: Secondary | ICD-10-CM | POA: Diagnosis not present

## 2016-01-21 DIAGNOSIS — R1314 Dysphagia, pharyngoesophageal phase: Secondary | ICD-10-CM | POA: Diagnosis not present

## 2016-01-21 DIAGNOSIS — N400 Enlarged prostate without lower urinary tract symptoms: Secondary | ICD-10-CM | POA: Diagnosis not present

## 2016-01-21 DIAGNOSIS — I10 Essential (primary) hypertension: Secondary | ICD-10-CM | POA: Diagnosis not present

## 2016-01-21 DIAGNOSIS — Z8673 Personal history of transient ischemic attack (TIA), and cerebral infarction without residual deficits: Secondary | ICD-10-CM | POA: Diagnosis not present

## 2016-01-21 DIAGNOSIS — K222 Esophageal obstruction: Secondary | ICD-10-CM | POA: Diagnosis not present

## 2016-02-26 DIAGNOSIS — Z23 Encounter for immunization: Secondary | ICD-10-CM | POA: Diagnosis not present

## 2016-02-26 DIAGNOSIS — R109 Unspecified abdominal pain: Secondary | ICD-10-CM | POA: Diagnosis not present

## 2016-03-20 ENCOUNTER — Emergency Department (HOSPITAL_COMMUNITY): Payer: Medicare Other

## 2016-03-20 ENCOUNTER — Emergency Department (HOSPITAL_COMMUNITY)
Admission: EM | Admit: 2016-03-20 | Discharge: 2016-03-21 | Disposition: A | Discharge status: Home / Self Care | Payer: Medicare Other | Attending: Emergency Medicine | Admitting: Emergency Medicine

## 2016-03-20 ENCOUNTER — Encounter (HOSPITAL_COMMUNITY): Payer: Self-pay | Admitting: Emergency Medicine

## 2016-03-20 DIAGNOSIS — K429 Umbilical hernia without obstruction or gangrene: Secondary | ICD-10-CM | POA: Diagnosis not present

## 2016-03-20 DIAGNOSIS — E119 Type 2 diabetes mellitus without complications: Secondary | ICD-10-CM | POA: Insufficient documentation

## 2016-03-20 DIAGNOSIS — Z79899 Other long term (current) drug therapy: Secondary | ICD-10-CM | POA: Insufficient documentation

## 2016-03-20 DIAGNOSIS — Z87891 Personal history of nicotine dependence: Secondary | ICD-10-CM | POA: Diagnosis not present

## 2016-03-20 DIAGNOSIS — R109 Unspecified abdominal pain: Secondary | ICD-10-CM | POA: Diagnosis present

## 2016-03-20 DIAGNOSIS — I10 Essential (primary) hypertension: Secondary | ICD-10-CM | POA: Insufficient documentation

## 2016-03-20 LAB — COMPREHENSIVE METABOLIC PANEL
ALBUMIN: 4 g/dL (ref 3.5–5.0)
ALT: 18 U/L (ref 17–63)
ANION GAP: 6 (ref 5–15)
AST: 23 U/L (ref 15–41)
Alkaline Phosphatase: 84 U/L (ref 38–126)
BILIRUBIN TOTAL: 0.7 mg/dL (ref 0.3–1.2)
BUN: 10 mg/dL (ref 6–20)
CALCIUM: 8.9 mg/dL (ref 8.9–10.3)
CO2: 30 mmol/L (ref 22–32)
Chloride: 103 mmol/L (ref 101–111)
Creatinine, Ser: 1.05 mg/dL (ref 0.61–1.24)
GFR calc Af Amer: >60 mL/min (ref >60)
GFR calc non Af Amer: >60 mL/min (ref >60)
GLUCOSE: 150 mg/dL — AB (ref 65–99)
POTASSIUM: 4 mmol/L (ref 3.5–5.1)
SODIUM: 139 mmol/L (ref 135–145)
TOTAL PROTEIN: 7.5 g/dL (ref 6.5–8.1)

## 2016-03-20 LAB — URINALYSIS, ROUTINE W REFLEX MICROSCOPIC
Bilirubin Urine: NEGATIVE
Glucose, UA: NEGATIVE mg/dL
Hgb urine dipstick: NEGATIVE
Ketones, ur: NEGATIVE mg/dL
LEUKOCYTES UA: NEGATIVE
Nitrite: NEGATIVE
PROTEIN: NEGATIVE mg/dL
Specific Gravity, Urine: 1.017 (ref 1.005–1.030)
pH: 7.5 (ref 5.0–8.0)

## 2016-03-20 LAB — CBC
HCT: 41.1 % (ref 39.0–52.0)
HEMOGLOBIN: 13 g/dL (ref 13.0–17.0)
MCH: 28 pg (ref 26.0–34.0)
MCHC: 31.6 g/dL (ref 30.0–36.0)
MCV: 88.6 fL (ref 78.0–100.0)
PLATELETS: 181 10*3/uL (ref 150–400)
RBC: 4.64 MIL/uL (ref 4.22–5.81)
RDW: 15.8 % — ABNORMAL HIGH (ref 11.5–15.5)
WBC: 5.5 10*3/uL (ref 4.0–10.5)

## 2016-03-20 LAB — LIPASE, BLOOD: Lipase: 30 U/L (ref 11–51)

## 2016-03-20 MED ORDER — IOPAMIDOL (ISOVUE-300) INJECTION 61%
100.0000 mL | Freq: Once | INTRAVENOUS | Status: AC | PRN
  Administered 2016-03-20: 100 mL via INTRAVENOUS

## 2016-03-20 NOTE — ED Triage Notes (Signed)
Patient c/o pain at umbilicus that is sharp x 5 months that has gotten worse over the past 2 days. Patient states that seen PCP who changed patient meds to Nexium and also GI.  Patient states that has to take meds to help have BMs. Patient last BM was today that was has hard and half normal.

## 2016-03-20 NOTE — ED Notes (Signed)
Pt is aware that he needs urine sample and has a urinal at the bedside

## 2016-03-20 NOTE — Discharge Instructions (Signed)
Your CT scan today showed an umbilical hernia which is likely the cause of your abdominal pain. Please follow up with a general surgeon for re-evaluation and possible repair. Return to the ED if you experience severe worsening of your symptoms, vomiting, increased pain, fever or chills.

## 2016-03-20 NOTE — ED Provider Notes (Signed)
[facility]-EMERGENCY DEPT Provider Note   CSN: [id number] Arrival date & time: 03/20/16  1739  By signing my name below, I, Andrew Winters, attest that this documentation has been prepared under the direction and in the presence of [employer], PA-C. Electronically Signed: Angelene Giovanni, ED Scribe. 03/20/16. 8:52 PM.    History   Chief Complaint Chief Complaint  Patient presents with  . Abdominal Pain    HPI Comments: Andrew Winters is a 75 y.o. male with a hx of DM II, HLD, and Hypertension who presents to the Emergency Department complaining of persistent intermittent non-radiating moderate umbilical pain onset 5 months ago, worsening 2 days ago. He reports associated decreased appetite. He notes that the pain is worse prior to eating and feels better after eating. He denies that having BMs affect his pain and his last normal BM was this morning. He reports a hx of colonoscopy with espophagogastroduodenoscopy and esophageal dilation in June 2017. He adds that he was placed on Nexium approximately one month ago by his GI. He denies any frequent NSAIDs use or ETOH use. He also denies any fever, chills, nausea, vomiting, diarrhea, constipation, blood in stool, dysuria, or hematuria.   PCP: Dr. Leonia Reader  GI: Dr. Chales Abrahams   The history is provided by the patient. No language interpreter was used.    Past Medical History:  Diagnosis Date  . Anxiety   . Bilateral swelling of feet   . BPH (benign prostatic hyperplasia)    no meds needed per medical md  . Cataracts, bilateral    immature  . Chronic diarrhea   . Deafness    in left ear   . Depression    takes Zoloft daily  . Diabetes mellitus    takes Actos and Amaryl daily as well as Onglyza  . Dysphagia   . Fatigue   . GERD (gastroesophageal reflux disease)    takes Omeprazole daily  . Headaches, cluster   . Hiatal hernia   . Hyperlipemia    was on meds but hasnt been on anything in about 85months per MD  . Hypertension      takes Amlodipine,HCTZ,and Lisinopril daily  . Hyperthyroidism   . Insomnia    takes Trazodone nightly  . Joint pain   . Joint swelling   . Muscle pain   . Nocturia   . Osteoarthritis   . Peripheral neuropathy (HCC)    takes Gabapentin daily  . Pneumonia 3+ yrs ago  . Presbyesophagus   . Rheumatoid arthritis(714.0)   . Vitamin D deficiency    takes Vit D daily    Patient Active Problem List   Diagnosis Date Noted  . Pseudogout of right knee 01/13/2015  . DVT of upper extremity (deep vein thrombosis): LEFT 01/11/2015  . Anemia, iron deficiency 01/11/2015  . Postoperative fever   . Fever 01/08/2015  . Diabetes mellitus type 2, controlled (HCC) 01/07/2015  . Essential hypertension 01/07/2015  . SIRS (systemic inflammatory response syndrome) (HCC) 01/07/2015  . Acute encephalopathy   . Primary osteoarthritis of right knee 01/06/2015  . Diabetes (HCC) 01/06/2015  . Morbid obesity (HCC) 01/06/2015  . DIABETIC PERIPHERAL NEUROPATHY 01/05/2010  . ANKLE PAIN, LEFT 01/05/2010  . HEADACHE 05/07/2009  . UNSPECIFIED VITAMIN D DEFICIENCY 02/03/2009  . LIVER FUNCTION TESTS, ABNORMAL 07/19/2007  . HYPERTHYROIDISM 01/04/2007  . DIABETES MELLITUS, TYPE II 01/04/2007  . HYPERLIPIDEMIA 01/04/2007  . ERECTILE DYSFUNCTION 01/04/2007  . DEPRESSION 01/04/2007  . HYPERTENSION 01/04/2007  . GERD 01/04/2007  .  BENIGN PROSTATIC HYPERTROPHY 01/04/2007  . OSTEOARTHRITIS 01/04/2007    Past Surgical History:  Procedure Laterality Date  . BACK SURGERY  1998  . COLONOSCOPY WITH ESOPHAGOGASTRODUODENOSCOPY (EGD) AND ESOPHAGEAL DILATION (ED)    . IRRIGATION AND DEBRIDEMENT KNEE Right 01/12/2015  . TOTAL KNEE ARTHROPLASTY Right 01/06/2015   Procedure: TOTAL KNEE ARTHROPLASTY;  Surgeon: Marcene Corning, MD;  Location: [facility] OR;  Service: Orthopedics;  Laterality: Right;       Home Medications    Prior to Admission medications   Medication Sig Start Date End Date Taking? Authorizing Provider   acetaminophen (TYLENOL) 500 MG tablet Take 500 mg by mouth every 6 (six) hours as needed for mild pain or moderate pain.    Historical Provider, MD  amLODipine (NORVASC) 5 MG tablet Take 5 mg by mouth daily.      Historical Provider, MD  atorvastatin (LIPITOR) 40 MG tablet  02/08/15   Historical Provider, MD  Cholecalciferol (VITAMIN D) 1000 UNITS capsule Take 1,000 Units by mouth 2 (two) times daily.      Historical Provider, MD  docusate sodium (COLACE) 100 MG capsule Take 1 capsule (100 mg total) by mouth 2 (two) times daily. Patient not taking: Reported on 10/08/2015 01/13/15   Rodolph Bong, MD  donepezil (ARICEPT) 5 MG tablet Take 5 mg by mouth at bedtime.  01/30/14   Historical Provider, MD  enoxaparin (LOVENOX) 150 MG/ML injection Inject 0.85 mLs (130 mg total) into the skin every 12 (twelve) hours. 01/14/15   Elodia Florence, PA-C  ferrous sulfate 325 (65 FE) MG tablet Take 1 tablet (325 mg total) by mouth 3 (three) times daily with meals. 01/13/15   Rodolph Bong, MD  fluticasone (FLONASE) 50 MCG/ACT nasal spray Place 1 spray into both nostrils daily.    Historical Provider, MD  gabapentin (NEURONTIN) 100 MG capsule Take 100 mg by mouth 3 (three) times daily.      Historical Provider, MD  glimepiride (AMARYL) 4 MG tablet Take 4 mg by mouth daily before breakfast.      Historical Provider, MD  HYDROcodone-acetaminophen (NORCO/VICODIN) 5-325 MG per tablet  02/11/15   Historical Provider, MD  indomethacin (INDOCIN SR) 75 MG CR capsule Take 1 capsule (75 mg total) by mouth 2 (two) times daily with a meal. Take for 3 days then as needed. 01/14/15   Rodolph Bong, MD  lisinopril (PRINIVIL,ZESTRIL) 10 MG tablet  12/03/14   Historical Provider, MD  lisinopril (PRINIVIL,ZESTRIL) 2.5 MG tablet Take 2.5 mg by mouth 2 (two) times daily.     Historical Provider, MD  meloxicam (MOBIC) 15 MG tablet  12/01/14   Historical Provider, MD  omeprazole (PRILOSEC) 20 MG capsule  01/29/15   Historical Provider, MD   Omeprazole 20 MG TBEC Take 20 mg by mouth daily at 12 noon.     Historical Provider, MD  ONGLYZA 2.5 MG TABS tablet Take 2.5 mg by mouth daily.  04/04/13   Historical Provider, MD  pioglitazone (ACTOS) 15 MG tablet Take 15 mg by mouth daily.  10/31/13   Historical Provider, MD  potassium chloride (K-DUR) 10 MEQ tablet  12/03/14   Historical Provider, MD  sertraline (ZOLOFT) 25 MG tablet Take 25 mg by mouth daily.  01/09/11   Historical Provider, MD  traMADol (ULTRAM) 50 MG tablet Take 1 tablet (50 mg total) by mouth every 6 (six) hours as needed for moderate pain or severe pain. 01/15/15   Sharon Seller, NP  traZODone (DESYREL) 50 MG tablet  Take 50 mg by mouth at bedtime. 11/03/14   Historical Provider, MD  warfarin (COUMADIN) 4 MG tablet To take 9 mg coumadin daily- take 5 mg tablet with 4 mg tablet to equal 9 mg daily 01/26/15   Sharon Seller, NP    Family History Family History  Problem Relation Age of Onset  . Heart disease Mother   . Diabetes Mother   . Heart attack Father   . Diabetes Brother   . Colon cancer Neg Hx     Social History Social History  Substance Use Topics  . Smoking status: Former Smoker    Packs/day: 1.00    Years: 35.00    Types: Cigarettes    Quit date: 10/15/2014  . Smokeless tobacco: Never Used     Comment: pack last about 2-3wks  . Alcohol use No     Allergies   Aspirin   Review of Systems Review of Systems  Constitutional: Negative for chills and fever.  Gastrointestinal: Positive for abdominal pain. Negative for blood in stool, constipation, diarrhea, nausea and vomiting.  Genitourinary: Negative for dysuria and hematuria.  All other systems reviewed and are negative.    Physical Exam Updated Vital Signs BP 134/78 (BP Location: Left Arm)   Pulse 70   Temp 98.1 F (36.7 C) (Oral)   Resp 18   SpO2 98%   Physical Exam  Constitutional: He is oriented to person, place, and time. He appears well-developed and well-nourished. No distress.   HENT:  Head: Normocephalic and atraumatic.  Mouth/Throat: No oropharyngeal exudate.  Eyes: Conjunctivae and EOM are normal. Pupils are equal, round, and reactive to light. Right eye exhibits no discharge. Left eye exhibits no discharge. No scleral icterus.  Cardiovascular: Normal rate.   Pulmonary/Chest: Effort normal.  Abdominal: Soft. He exhibits no distension. There is tenderness. There is no guarding.  Periumbilical TTP No peritoneal signs.  No palpable umbilical hernia. Pt has abdominal obesity without ascites.    Genitourinary: No penile tenderness.  Musculoskeletal: Normal range of motion. He exhibits no edema.  Neurological: He is alert and oriented to person, place, and time.  Skin: Skin is warm and dry. No rash noted. He is not diaphoretic. No erythema. No pallor.  Psychiatric: He has a normal mood and affect. His behavior is normal.  Nursing note and vitals reviewed.    ED Treatments / Results  DIAGNOSTIC STUDIES: Oxygen Saturation is 98% on RA, normal by my interpretation.    COORDINATION OF CARE: 8:37 PM- Pt advised of plan for treatment and pt agrees. Pt will receive CT abdomen for further evaluation.   Labs (all labs ordered are listed, but only abnormal results are displayed) Labs Reviewed  COMPREHENSIVE METABOLIC PANEL - Abnormal; Notable for the following:       Result Value   Glucose, Bld 150 (*)    All other components within normal limits  CBC - Abnormal; Notable for the following:    RDW 15.8 (*)    All other components within normal limits  LIPASE, BLOOD  URINALYSIS, ROUTINE W REFLEX MICROSCOPIC (NOT AT [facility])    EKG  EKG Interpretation None       Radiology Ct Abdomen Pelvis W Contrast  Result Date: 03/20/2016 CLINICAL DATA:  Mid abdominal pain, intermittent for 5 months. Worsened over the past few days. EXAM: CT ABDOMEN AND PELVIS WITH CONTRAST TECHNIQUE: Multidetector CT imaging of the abdomen and pelvis was performed using the standard  protocol following bolus administration of intravenous contrast. CONTRAST:  ISOVUE-300 IOPAMIDOL (ISOVUE-300) INJECTION 61% COMPARISON:  None. FINDINGS: Lower chest: No significant abnormality Hepatobiliary: There are normal appearances of the liver, gallbladder and bile ducts. Pancreas: Normal Spleen: Normal Adrenals/Urinary Tract: The adrenals and kidneys are normal in appearance. There is no urinary calculus evident. There is no hydronephrosis or ureteral dilatation. Collecting systems and ureters appear unremarkable. Stomach/Bowel: There are normal appearances of the stomach, small bowel and colon. The appendix is normal. Vascular/Lymphatic: The abdominal aorta is normal in caliber. There is mild atherosclerotic calcification. There is no adenopathy in the abdomen or pelvis. Reproductive: Marked prostatic enlargement. Unremarkable seminal vesicles. Other: No acute inflammatory changes are evident in the abdomen or pelvis. There is a wide-mouthed umbilical hernia containing a loop of unobstructed small bowel. The small bowel extends all the way to the shallow subcutaneous tissues within the hernia. Musculoskeletal: No significant skeletal lesion. Prior lower lumbar surgical decompression posteriorly. IMPRESSION: 1. No acute inflammatory changes are evident in the abdomen or pelvis. 2. Umbilical hernia containing loop of unobstructed small bowel. Electronically Signed   By: Ellery Plunk M.D.   On: 03/20/2016 22:27    Procedures Procedures (including critical care time)  Medications Ordered in ED Medications - No data to display   Initial Impression / Assessment and Plan / ED Course  Gaylyn Rong, PA-C has reviewed the triage vital signs and the nursing notes.  Pertinent labs & imaging results that were available during my care of the patient were reviewed by me and considered in my medical decision making (see chart for details).  Clinical Course    75 year old male with past  medical history of diabetes, HLD, HTN who presents to ED today complaining of periumbilical abdominal pain onset 5 months ago that is worsening in the last 2 days. On presentation ED patient appears well, nontoxic and nonseptic appearing. Pain is worsened before eating. No associated nausea, vomiting or diarrhea. Patient is followed by Dr. Chales Abrahams with GI. Upon review of his medical record he had an EGD and colonoscopy. Unfortunately, I am unable to see the results of these studies. Laboratory today unremarkable. CT abdomen reveals umbilical hernia. It does not appear to be incarcerated or strangulate it. This is likely the cause of patient's symptoms. Recommend referral to general surgery for consultation and possible repair. Discussed findings with patient and family member who expressed understanding. Return precautions outlined in Patient discharge instructions.  Patient was discussed with and seen by Dr. Clydene Pugh who agrees with the treatment plan.    Final Clinical Impressions(s) / ED Diagnoses   Final diagnoses:  Umbilical hernia without obstruction and without gangrene    New Prescriptions New Prescriptions   No medications on file   I personally performed the services described in this documentation, which was scribed in my presence. The recorded information has been reviewed and is accurate.      Lester Kinsman [place], PA-C 03/21/16 [id number]    Lyndal Pulley, MD 03/21/16 1038

## 2016-03-25 DIAGNOSIS — E1165 Type 2 diabetes mellitus with hyperglycemia: Secondary | ICD-10-CM | POA: Diagnosis not present

## 2016-03-25 DIAGNOSIS — M150 Primary generalized (osteo)arthritis: Secondary | ICD-10-CM | POA: Diagnosis not present

## 2016-03-25 DIAGNOSIS — E1140 Type 2 diabetes mellitus with diabetic neuropathy, unspecified: Secondary | ICD-10-CM | POA: Diagnosis not present

## 2016-03-25 DIAGNOSIS — I10 Essential (primary) hypertension: Secondary | ICD-10-CM | POA: Diagnosis not present

## 2016-03-25 DIAGNOSIS — Z5181 Encounter for therapeutic drug level monitoring: Secondary | ICD-10-CM | POA: Diagnosis not present

## 2016-03-29 DIAGNOSIS — B351 Tinea unguium: Secondary | ICD-10-CM | POA: Diagnosis not present

## 2016-03-29 DIAGNOSIS — E119 Type 2 diabetes mellitus without complications: Secondary | ICD-10-CM | POA: Diagnosis not present

## 2016-03-30 DIAGNOSIS — K429 Umbilical hernia without obstruction or gangrene: Secondary | ICD-10-CM | POA: Diagnosis not present

## 2016-03-30 DIAGNOSIS — E119 Type 2 diabetes mellitus without complications: Secondary | ICD-10-CM | POA: Diagnosis not present

## 2016-04-27 DIAGNOSIS — I10 Essential (primary) hypertension: Secondary | ICD-10-CM | POA: Diagnosis not present

## 2016-05-05 DIAGNOSIS — Z01810 Encounter for preprocedural cardiovascular examination: Secondary | ICD-10-CM | POA: Diagnosis not present

## 2016-05-16 DIAGNOSIS — K429 Umbilical hernia without obstruction or gangrene: Secondary | ICD-10-CM | POA: Diagnosis not present

## 2016-05-16 DIAGNOSIS — E119 Type 2 diabetes mellitus without complications: Secondary | ICD-10-CM | POA: Diagnosis not present

## 2016-05-20 ENCOUNTER — Encounter (HOSPITAL_COMMUNITY): Payer: Self-pay | Admitting: *Deleted

## 2016-05-20 ENCOUNTER — Emergency Department (HOSPITAL_COMMUNITY): Payer: Medicare Other

## 2016-05-20 ENCOUNTER — Inpatient Hospital Stay (HOSPITAL_COMMUNITY)
Admission: EM | Admit: 2016-05-20 | Discharge: 2016-05-24 | DRG: 176 | Disposition: A | Discharge status: Home Health | Payer: Medicare Other | Attending: Family Medicine | Admitting: Family Medicine

## 2016-05-20 DIAGNOSIS — R079 Chest pain, unspecified: Secondary | ICD-10-CM | POA: Diagnosis not present

## 2016-05-20 DIAGNOSIS — H9192 Unspecified hearing loss, left ear: Secondary | ICD-10-CM | POA: Diagnosis present

## 2016-05-20 DIAGNOSIS — E1142 Type 2 diabetes mellitus with diabetic polyneuropathy: Secondary | ICD-10-CM | POA: Diagnosis present

## 2016-05-20 DIAGNOSIS — I2692 Saddle embolus of pulmonary artery without acute cor pulmonale: Secondary | ICD-10-CM | POA: Diagnosis not present

## 2016-05-20 DIAGNOSIS — Z86718 Personal history of other venous thrombosis and embolism: Secondary | ICD-10-CM | POA: Diagnosis not present

## 2016-05-20 DIAGNOSIS — Z7984 Long term (current) use of oral hypoglycemic drugs: Secondary | ICD-10-CM | POA: Diagnosis not present

## 2016-05-20 DIAGNOSIS — E785 Hyperlipidemia, unspecified: Secondary | ICD-10-CM | POA: Diagnosis not present

## 2016-05-20 DIAGNOSIS — Z833 Family history of diabetes mellitus: Secondary | ICD-10-CM

## 2016-05-20 DIAGNOSIS — I10 Essential (primary) hypertension: Secondary | ICD-10-CM | POA: Diagnosis not present

## 2016-05-20 DIAGNOSIS — D696 Thrombocytopenia, unspecified: Secondary | ICD-10-CM | POA: Diagnosis not present

## 2016-05-20 DIAGNOSIS — Z87891 Personal history of nicotine dependence: Secondary | ICD-10-CM

## 2016-05-20 DIAGNOSIS — Z23 Encounter for immunization: Secondary | ICD-10-CM | POA: Diagnosis not present

## 2016-05-20 DIAGNOSIS — Z886 Allergy status to analgesic agent status: Secondary | ICD-10-CM | POA: Diagnosis not present

## 2016-05-20 DIAGNOSIS — K429 Umbilical hernia without obstruction or gangrene: Secondary | ICD-10-CM | POA: Diagnosis not present

## 2016-05-20 DIAGNOSIS — Z96651 Presence of right artificial knee joint: Secondary | ICD-10-CM

## 2016-05-20 DIAGNOSIS — R0781 Pleurodynia: Secondary | ICD-10-CM | POA: Diagnosis present

## 2016-05-20 DIAGNOSIS — K219 Gastro-esophageal reflux disease without esophagitis: Secondary | ICD-10-CM | POA: Diagnosis present

## 2016-05-20 DIAGNOSIS — F0390 Unspecified dementia without behavioral disturbance: Secondary | ICD-10-CM | POA: Diagnosis present

## 2016-05-20 DIAGNOSIS — E559 Vitamin D deficiency, unspecified: Secondary | ICD-10-CM | POA: Diagnosis present

## 2016-05-20 DIAGNOSIS — I2699 Other pulmonary embolism without acute cor pulmonale: Secondary | ICD-10-CM | POA: Diagnosis not present

## 2016-05-20 DIAGNOSIS — Z6836 Body mass index (BMI) 36.0-36.9, adult: Secondary | ICD-10-CM

## 2016-05-20 DIAGNOSIS — F329 Major depressive disorder, single episode, unspecified: Secondary | ICD-10-CM | POA: Diagnosis present

## 2016-05-20 DIAGNOSIS — R0602 Shortness of breath: Secondary | ICD-10-CM | POA: Diagnosis not present

## 2016-05-20 HISTORY — DX: Saddle embolus of pulmonary artery without acute cor pulmonale: I26.92

## 2016-05-20 LAB — CBC
HCT: 40.4 % (ref 39.0–52.0)
Hemoglobin: 13.1 g/dL (ref 13.0–17.0)
MCH: 27.7 pg (ref 26.0–34.0)
MCHC: 32.4 g/dL (ref 30.0–36.0)
MCV: 85.4 fL (ref 78.0–100.0)
PLATELETS: 153 10*3/uL (ref 150–400)
RBC: 4.73 MIL/uL (ref 4.22–5.81)
RDW: 16.7 % — ABNORMAL HIGH (ref 11.5–15.5)
WBC: 6.3 10*3/uL (ref 4.0–10.5)

## 2016-05-20 LAB — I-STAT TROPONIN, ED: TROPONIN I, POC: 0.01 ng/mL (ref 0.00–0.08)

## 2016-05-20 LAB — BASIC METABOLIC PANEL
Anion gap: 12 (ref 5–15)
BUN: 10 mg/dL (ref 6–20)
CALCIUM: 9 mg/dL (ref 8.9–10.3)
CO2: 22 mmol/L (ref 22–32)
CREATININE: 1.16 mg/dL (ref 0.61–1.24)
Chloride: 105 mmol/L (ref 101–111)
GFR calc non Af Amer: 60 mL/min — ABNORMAL LOW (ref >60)
Glucose, Bld: 136 mg/dL — ABNORMAL HIGH (ref 65–99)
Potassium: 4.3 mmol/L (ref 3.5–5.1)
SODIUM: 139 mmol/L (ref 135–145)

## 2016-05-20 LAB — GLUCOSE, CAPILLARY
GLUCOSE-CAPILLARY: 165 mg/dL — AB (ref 65–99)
Glucose-Capillary: 101 mg/dL — ABNORMAL HIGH (ref 65–99)

## 2016-05-20 LAB — D-DIMER, QUANTITATIVE (NOT AT ARMC)

## 2016-05-20 LAB — LIPASE, BLOOD: LIPASE: 20 U/L (ref 11–51)

## 2016-05-20 LAB — HEPARIN LEVEL (UNFRACTIONATED): Heparin Unfractionated: 0.84 [IU]/mL — ABNORMAL HIGH (ref 0.30–0.70)

## 2016-05-20 LAB — MRSA PCR SCREENING: MRSA BY PCR: NEGATIVE

## 2016-05-20 MED ORDER — DOCUSATE SODIUM 100 MG PO CAPS: 100.0000 mg | ORAL_CAPSULE | Freq: Two times a day (BID) | ORAL | Status: DC | PRN

## 2016-05-20 MED ORDER — HEPARIN BOLUS VIA INFUSION
5500.0000 [IU] | Freq: Once | INTRAVENOUS | Status: AC
  Administered 2016-05-20: 5500 [IU] via INTRAVENOUS
  Filled 2016-05-20: qty 5500

## 2016-05-20 MED ORDER — FLUTICASONE PROPIONATE 50 MCG/ACT NA SUSP: 2.0000 | Freq: Every day | NASAL | Status: DC | PRN

## 2016-05-20 MED ORDER — IOPAMIDOL (ISOVUE-370) INJECTION 76%
INTRAVENOUS | Status: AC
  Administered 2016-05-20: 100 mL
  Filled 2016-05-20: qty 100

## 2016-05-20 MED ORDER — ACETAMINOPHEN 325 MG PO TABS: 650.0000 mg | ORAL_TABLET | Freq: Four times a day (QID) | ORAL | Status: DC | PRN

## 2016-05-20 MED ORDER — PNEUMOCOCCAL VAC POLYVALENT 25 MCG/0.5ML IJ INJ
0.5000 mL | INJECTION | INTRAMUSCULAR | Status: AC
  Administered 2016-05-21: 0.5 mL via INTRAMUSCULAR
  Filled 2016-05-20: qty 0.5

## 2016-05-20 MED ORDER — HEPARIN (PORCINE) IN NACL 100-0.45 UNIT/ML-% IJ SOLN
1300.0000 [IU]/h | INTRAMUSCULAR | Status: DC
  Administered 2016-05-21: 1300 [IU]/h via INTRAVENOUS
  Administered 2016-05-21 (×2): 1450 [IU]/h via INTRAVENOUS
  Administered 2016-05-22 – 2016-05-24 (×3): 1300 [IU]/h via INTRAVENOUS
  Filled 2016-05-20 (×7): qty 250

## 2016-05-20 MED ORDER — FUROSEMIDE 40 MG PO TABS
20.0000 mg | ORAL_TABLET | Freq: Every day | ORAL | Status: DC
  Administered 2016-05-20 – 2016-05-24 (×5): 20 mg via ORAL
  Filled 2016-05-20 (×5): qty 1

## 2016-05-20 MED ORDER — POLYETHYLENE GLYCOL 3350 17 G PO PACK: 17.0000 g | PACK | Freq: Every day | ORAL | Status: DC | PRN

## 2016-05-20 MED ORDER — POTASSIUM CHLORIDE CRYS ER 10 MEQ PO TBCR: 10.0000 meq | EXTENDED_RELEASE_TABLET | Freq: Every day | ORAL | Status: DC

## 2016-05-20 MED ORDER — ACETAMINOPHEN 650 MG RE SUPP: 650.0000 mg | Freq: Four times a day (QID) | RECTAL | Status: DC | PRN

## 2016-05-20 MED ORDER — ONDANSETRON HCL 4 MG PO TABS
4.0000 mg | ORAL_TABLET | Freq: Four times a day (QID) | ORAL | Status: DC | PRN
  Administered 2016-05-24: 4 mg via ORAL
  Filled 2016-05-20: qty 1

## 2016-05-20 MED ORDER — TRAMADOL HCL 50 MG PO TABS
50.0000 mg | ORAL_TABLET | Freq: Three times a day (TID) | ORAL | Status: DC | PRN
  Administered 2016-05-20 – 2016-05-23 (×4): 50 mg via ORAL
  Filled 2016-05-20 (×4): qty 1

## 2016-05-20 MED ORDER — GABAPENTIN 100 MG PO CAPS
100.0000 mg | ORAL_CAPSULE | Freq: Three times a day (TID) | ORAL | Status: DC
  Administered 2016-05-20 – 2016-05-24 (×12): 100 mg via ORAL
  Filled 2016-05-20 (×12): qty 1

## 2016-05-20 MED ORDER — FERROUS SULFATE 325 (65 FE) MG PO TABS
325.0000 mg | ORAL_TABLET | Freq: Every day | ORAL | Status: DC
  Administered 2016-05-21 – 2016-05-24 (×4): 325 mg via ORAL
  Filled 2016-05-20 (×4): qty 1

## 2016-05-20 MED ORDER — ATORVASTATIN CALCIUM 40 MG PO TABS
40.0000 mg | ORAL_TABLET | Freq: Every day | ORAL | Status: DC
  Administered 2016-05-20 – 2016-05-23 (×4): 40 mg via ORAL
  Filled 2016-05-20 (×4): qty 1

## 2016-05-20 MED ORDER — TRAZODONE HCL 50 MG PO TABS
50.0000 mg | ORAL_TABLET | Freq: Every day | ORAL | Status: DC
  Administered 2016-05-20 – 2016-05-23 (×4): 50 mg via ORAL
  Filled 2016-05-20 (×4): qty 1

## 2016-05-20 MED ORDER — DONEPEZIL HCL 10 MG PO TABS
10.0000 mg | ORAL_TABLET | Freq: Every day | ORAL | Status: DC
Start: 2016-05-20 — End: 2016-05-25
  Administered 2016-05-20 – 2016-05-23 (×4): 10 mg via ORAL
  Filled 2016-05-20 (×5): qty 1

## 2016-05-20 MED ORDER — PANTOPRAZOLE SODIUM 40 MG PO TBEC
40.0000 mg | DELAYED_RELEASE_TABLET | Freq: Two times a day (BID) | ORAL | Status: DC
  Administered 2016-05-20 – 2016-05-24 (×8): 40 mg via ORAL
  Filled 2016-05-20 (×8): qty 1

## 2016-05-20 MED ORDER — SERTRALINE HCL 50 MG PO TABS
25.0000 mg | ORAL_TABLET | Freq: Every day | ORAL | Status: DC
  Administered 2016-05-20 – 2016-05-24 (×5): 25 mg via ORAL
  Filled 2016-05-20 (×5): qty 1

## 2016-05-20 MED ORDER — AMLODIPINE BESYLATE 5 MG PO TABS
5.0000 mg | ORAL_TABLET | Freq: Every day | ORAL | Status: DC
  Administered 2016-05-20 – 2016-05-24 (×5): 5 mg via ORAL
  Filled 2016-05-20 (×5): qty 1

## 2016-05-20 MED ORDER — LORATADINE 10 MG PO TABS
10.0000 mg | ORAL_TABLET | Freq: Every day | ORAL | Status: DC
  Administered 2016-05-20 – 2016-05-24 (×5): 10 mg via ORAL
  Filled 2016-05-20 (×5): qty 1

## 2016-05-20 MED ORDER — ONDANSETRON HCL 4 MG/2ML IJ SOLN: 4.0000 mg | Freq: Four times a day (QID) | INTRAMUSCULAR | Status: DC | PRN

## 2016-05-20 MED ORDER — LISINOPRIL 2.5 MG PO TABS
10.0000 mg | ORAL_TABLET | Freq: Every day | ORAL | Status: DC
  Administered 2016-05-20 – 2016-05-24 (×5): 10 mg via ORAL
  Filled 2016-05-20 (×5): qty 4

## 2016-05-20 MED ORDER — INSULIN ASPART 100 UNIT/ML ~~LOC~~ SOLN
0.0000 [IU] | Freq: Three times a day (TID) | SUBCUTANEOUS | Status: DC
  Administered 2016-05-21 (×2): 3 [IU] via SUBCUTANEOUS
  Administered 2016-05-21: 2 [IU] via SUBCUTANEOUS
  Administered 2016-05-22 (×3): 3 [IU] via SUBCUTANEOUS
  Administered 2016-05-23 – 2016-05-24 (×3): 2 [IU] via SUBCUTANEOUS

## 2016-05-20 NOTE — ED Triage Notes (Signed)
Pt states L sided chest pressure since last night.

## 2016-05-20 NOTE — ED Notes (Signed)
Pt returned from xray

## 2016-05-20 NOTE — ED Notes (Signed)
Patient transported to X-ray 

## 2016-05-20 NOTE — ED Notes (Signed)
Report given to 4E RN.  

## 2016-05-20 NOTE — ED Provider Notes (Signed)
[facility]-EMERGENCY DEPT Provider Note   CSN: [id number] Arrival date & time: 05/20/16  1045     History   Chief Complaint Chief Complaint  Patient presents with  . Chest Pain    HPI Andrew Winters is a 75 y.o. male.  HPI Patient developed left-sided chest pain yesterday. It is dull. Worse with breathing. States he has had a little bit of a cough with clear sputum. States he had some chills. Slight abdominal pain 2. States he had a recent stress test to get cleared for hernia surgery. Does not know the results. No swelling in his legs. Pain is still there. Has not had pains like this before. No previous history of heart attack.  Past Medical History:  Diagnosis Date  . Anxiety   . Bilateral swelling of feet   . BPH (benign prostatic hyperplasia)    no meds needed per medical md  . Cataracts, bilateral    immature  . Chronic diarrhea   . Deafness    in left ear   . Depression    takes Zoloft daily  . Diabetes mellitus    takes Actos and Amaryl daily as well as Onglyza  . Dysphagia   . Fatigue   . GERD (gastroesophageal reflux disease)    takes Omeprazole daily  . Headaches, cluster   . Hiatal hernia   . Hyperlipemia    was on meds but hasnt been on anything in about 10months per MD  . Hypertension    takes Amlodipine,HCTZ,and Lisinopril daily  . Hyperthyroidism   . Insomnia    takes Trazodone nightly  . Joint pain   . Joint swelling   . Muscle pain   . Nocturia   . Osteoarthritis   . Peripheral neuropathy (HCC)    takes Gabapentin daily  . Pneumonia 3+ yrs ago  . Presbyesophagus   . Rheumatoid arthritis(714.0)   . Vitamin D deficiency    takes Vit D daily    Patient Active Problem List   Diagnosis Date Noted  . Pulmonary emboli (HCC) 05/20/2016  . Pseudogout of right knee 01/13/2015  . DVT of upper extremity (deep vein thrombosis): LEFT 01/11/2015  . Anemia, iron deficiency 01/11/2015  . Postoperative fever   . Fever 01/08/2015  . Diabetes mellitus  type 2, controlled (HCC) 01/07/2015  . Essential hypertension 01/07/2015  . SIRS (systemic inflammatory response syndrome) (HCC) 01/07/2015  . Acute encephalopathy   . Primary osteoarthritis of right knee 01/06/2015  . Diabetes (HCC) 01/06/2015  . Morbid obesity (HCC) 01/06/2015  . DIABETIC PERIPHERAL NEUROPATHY 01/05/2010  . ANKLE PAIN, LEFT 01/05/2010  . HEADACHE 05/07/2009  . UNSPECIFIED VITAMIN D DEFICIENCY 02/03/2009  . LIVER FUNCTION TESTS, ABNORMAL 07/19/2007  . HYPERTHYROIDISM 01/04/2007  . DIABETES MELLITUS, TYPE II 01/04/2007  . HYPERLIPIDEMIA 01/04/2007  . ERECTILE DYSFUNCTION 01/04/2007  . DEPRESSION 01/04/2007  . HYPERTENSION 01/04/2007  . GERD 01/04/2007  . BENIGN PROSTATIC HYPERTROPHY 01/04/2007  . OSTEOARTHRITIS 01/04/2007    Past Surgical History:  Procedure Laterality Date  . BACK SURGERY  1998  . COLONOSCOPY WITH ESOPHAGOGASTRODUODENOSCOPY (EGD) AND ESOPHAGEAL DILATION (ED)    . IRRIGATION AND DEBRIDEMENT KNEE Right 01/12/2015  . TOTAL KNEE ARTHROPLASTY Right 01/06/2015   Procedure: TOTAL KNEE ARTHROPLASTY;  Surgeon: Marcene Corning, MD;  Location: [facility] OR;  Service: Orthopedics;  Laterality: Right;       Home Medications    Prior to Admission medications   Medication Sig Start Date End Date Taking? Authorizing Provider  acetaminophen (TYLENOL) 500  MG tablet Take 1,000 mg by mouth every 6 (six) hours as needed for mild pain, moderate pain or headache.    Yes Historical Provider, MD  amLODipine (NORVASC) 5 MG tablet Take 5 mg by mouth daily.   Yes Historical Provider, MD  atorvastatin (LIPITOR) 40 MG tablet Take 40 mg by mouth at bedtime.    Yes Historical Provider, MD  cetirizine (ZYRTEC) 10 MG tablet Take 10 mg by mouth daily.   Yes Historical Provider, MD  cholecalciferol (VITAMIN D) 1000 units tablet Take 1,000 Units by mouth 2 (two) times daily.   Yes Historical Provider, MD  docusate sodium (COLACE) 100 MG capsule Take 100 mg by mouth 2 (two) times  daily as needed for mild constipation.   Yes Historical Provider, MD  donepezil (ARICEPT) 10 MG tablet Take 10 mg by mouth at bedtime.   Yes Historical Provider, MD  ferrous sulfate 325 (65 FE) MG tablet Take 325 mg by mouth daily with breakfast.   Yes Historical Provider, MD  fluticasone (FLONASE) 50 MCG/ACT nasal spray Place 2 sprays into both nostrils daily as needed for rhinitis.    Yes Historical Provider, MD  furosemide (LASIX) 20 MG tablet Take 20 mg by mouth daily.   Yes Historical Provider, MD  gabapentin (NEURONTIN) 100 MG capsule Take 100 mg by mouth 3 (three) times daily.     Yes Historical Provider, MD  glimepiride (AMARYL) 4 MG tablet Take 4 mg by mouth 2 (two) times daily.    Yes Historical Provider, MD  lisinopril (PRINIVIL,ZESTRIL) 10 MG tablet Take 10 mg by mouth daily.    Yes Historical Provider, MD  metFORMIN (GLUCOPHAGE-XR) 500 MG 24 hr tablet Take 500 mg by mouth daily. 04/26/16  Yes Historical Provider, MD  omeprazole (PRILOSEC OTC) 20 MG tablet Take 20 mg by mouth 2 (two) times daily.   Yes Historical Provider, MD  potassium chloride (K-DUR,KLOR-CON) 10 MEQ tablet Take 10 mEq by mouth daily.   Yes Historical Provider, MD  ranitidine (ZANTAC) 150 MG tablet Take 150 mg by mouth at bedtime.   Yes Historical Provider, MD  sertraline (ZOLOFT) 25 MG tablet Take 25 mg by mouth daily.    Yes Historical Provider, MD  traMADol (ULTRAM) 50 MG tablet Take 50 mg by mouth every 8 (eight) hours as needed for moderate pain.   Yes Historical Provider, MD  traZODone (DESYREL) 50 MG tablet Take 50 mg by mouth at bedtime.   Yes Historical Provider, MD  vitamin E 400 UNIT capsule Take 400 Units by mouth daily.   Yes Historical Provider, MD    Family History Family History  Problem Relation Age of Onset  . Heart disease Mother   . Diabetes Mother   . Heart attack Father   . Diabetes Brother   . Colon cancer Neg Hx     Social History Social History  Substance Use Topics  . Smoking  status: Former Smoker    Packs/day: 1.00    Years: 35.00    Types: Cigarettes    Quit date: 10/15/2014  . Smokeless tobacco: Never Used     Comment: pack last about 2-3wks  . Alcohol use No     Allergies   Aspirin   Review of Systems Review of Systems  Constitutional: Negative for appetite change.  HENT: Negative for congestion.   Eyes: Negative for visual disturbance.  Respiratory: Positive for cough and shortness of breath.   Cardiovascular: Positive for chest pain.  Gastrointestinal: Negative for abdominal pain.  Genitourinary: Negative for dysuria.  Musculoskeletal: Negative for back pain.  Neurological: Negative for weakness and numbness.  Psychiatric/Behavioral: Negative for confusion.     Physical Exam Updated Vital Signs BP 120/74   Pulse 79   Temp 98.6 F (37 C) (Oral)   Resp (!) 28   Ht 6\' 1"  (1.854 m)   Wt 273 lb (123.8 kg)   SpO2 96%   BMI 36.02 kg/m   Physical Exam  Constitutional: He appears well-developed.  HENT:  Head: Atraumatic.  Neck: Neck supple.  Cardiovascular:  Mild tachycardia  Pulmonary/Chest: He exhibits tenderness.  Mildly harsh breath sounds on right side. Mild left anterior lower chest wall tenderness. No rash.  Abdominal: Soft. There is tenderness.  Mild left upper quadrant tenderness without rebound or guarding. Reducible umbilical hernia.  Musculoskeletal: He exhibits edema.  Mild bilateral lower extremity pitting edema.  Neurological: He is alert.  Skin: Skin is warm. Capillary refill takes less than 2 seconds.  Psychiatric: He has a normal mood and affect.     ED Treatments / Results  Labs (all labs ordered are listed, but only abnormal results are displayed) Labs Reviewed  BASIC METABOLIC PANEL - Abnormal; Notable for the following:       Result Value   Glucose, Bld 136 (*)    GFR calc non Af Amer 60 (*)    All other components within normal limits  CBC - Abnormal; Notable for the following:    RDW 16.7 (*)     All other components within normal limits  D-DIMER, QUANTITATIVE (NOT AT [facility]) - Abnormal; Notable for the following:    D-Dimer, Quant >20.00 (*)    All other components within normal limits  LIPASE, BLOOD  HEPARIN LEVEL (UNFRACTIONATED)  I-STAT TROPOININ, ED    EKG  EKG Interpretation  Date/Time:  Friday May 20 2016 10:56:18 EST Ventricular Rate:  111 PR Interval:  170 QRS Duration: 86 QT Interval:  332 QTC Calculation: 451 R Axis:   -83 Text Interpretation:  Sinus tachycardia Possible Left atrial enlargement Left axis deviation Low voltage QRS Abnormal ECG No significant change since last tracing Confirmed by Rubin Payor  MD, Harrold Donath [phone]) on 05/20/2016 11:04:20 AM Also confirmed by Rubin Payor  MD, Derwood Becraft [phone]), editor Stout CT, Jola Babinski [phone])  on 05/20/2016 11:09:43 AM       Radiology Dg Chest 2 View  Result Date: 05/20/2016 CLINICAL DATA:  Left side chest pain, shortness of Breath EXAM: CHEST  2 VIEW COMPARISON:  10/08/2015 FINDINGS: Cardiomediastinal silhouette is unremarkable. There is elevation of the right hemidiaphragm. There is streaky left base retrocardiac atelectasis or infiltrate best seen on lateral view. Degenerative changes thoracic spine. No pulmonary edema. IMPRESSION: Streaky left base retrocardiac atelectasis or infiltrate. No pulmonary edema. Degenerative changes thoracic spine. Electronically Signed   By: Natasha Mead M.D.   On: 05/20/2016 11:54   Ct Angio Chest Pe W And/or Wo Contrast  Result Date: 05/20/2016 CLINICAL DATA:  Left chest pressure, elevated D-dimer EXAM: CT ANGIOGRAPHY CHEST WITH CONTRAST TECHNIQUE: Multidetector CT imaging of the chest was performed using the standard protocol during bolus administration of intravenous contrast. Multiplanar CT image reconstructions and MIPs were obtained to evaluate the vascular anatomy. CONTRAST:  69 cc Isovue 370 COMPARISON:  CT angio chest of 01/08/2015 FINDINGS: Cardiovascular: The pulmonary is are well  opacified. There are bilateral pulmonary emboli present. There is a saddle embolus noted crossing from the right to the left pulmonary arteries. Large emboli are noted partially occluding the  branches of the left pulmonary artery to the left lower lobe and lingula. Also there are partially occlusive pulmonary emboli involving branches of the right pulmonary artery to the right lower lobe, right middle lobe, and right upper lobe. The RV/LV ratio is 1.1 which is abnormal and does indicate right heart strain which is associated with increased morbidity and mortality. Cardiomegaly is stable. Mediastinum/Nodes: No mediastinal or hilar adenopathy is seen. There is some fluid in the esophagus which could be due to reflux with slight thickening of of the mucosa of the distal esophagus. Correlate clinically. Lungs/Pleura: On lung window images, nodular pleural opacities primarily near the apices appear stable. Calcification of some of this thickened for in the left upper lung apex is stable. No new parenchymal opacity is seen. No pleural effusion is noted. Thickened pleura in the lower hemithoraces also is noted left-greater-than-right. Upper Abdomen: The upper abdomen is unremarkable. There may be some sludge layering within the gallbladder. Also there is slight prominence of the left adrenal gland most likely due to incidental adrenal adenoma which is unchanged. Musculoskeletal: There are degenerative changes throughout the thoracic spine. No compression deformity is seen. Review of the MIP images confirms the above findings. IMPRESSION: 1. Bilateral pulmonary emboli which are partially occlusive with evidence of right heart strain. Positive for acute PE with CT evidence of right heart strain (RV/LV Ratio = ) consistent with at least submassive (intermediate risk) PE. The presence of right heart strain has been associated with an increased risk of morbidity and mortality. Please activate Code PE by paging [phone]. 2.  Stable nodule or pleural thickening deep in the lung apices with some calcification most likely chronic in nature. 3. Question gastroesophageal reflux with some fluid in the esophagus. 4. Critical Value/emergent results were called by telephone at the time of interpretation on 05/20/2016 at 2:57 pm to Dr. Benjiman Core , who verbally acknowledged these results. Electronically Signed   By: Dwyane Dee M.D.   On: 05/20/2016 14:57    Procedures Procedures (including critical care time)  Medications Ordered in ED Medications  heparin ADULT infusion 100 units/mL (25000 units/275mL sodium chloride 0.45%) (1,600 Units/hr Intravenous New Bag/Given 05/20/16 1450)  iopamidol (ISOVUE-370) 76 % injection (100 mLs  Contrast Given 05/20/16 1409)  heparin bolus via infusion 5,500 Units (5,500 Units Intravenous Bolus from Bag 05/20/16 1450)     Initial Impression / Assessment and Plan / ED Course  I have reviewed the triage vital signs and the nursing notes.  Pertinent labs & imaging results that were available during my care of the patient were reviewed by me and considered in my medical decision making (see chart for details).  Clinical Course     Patient with left-sided chest pain that is pleuritic. Very elevated d-dimer. CT scan showed large following embolism with increased RV/TLC ratio. Discussed with critical care and Dr. Molli Knock stated that since vitals are reassuring does not need to come to their service. Code PE activated by radiology and I discussed with interventional radiology who will see the patient. Will admit to family practice.    Final Clinical Impressions(s) / ED Diagnoses   Final diagnoses:  None    New Prescriptions New Prescriptions   No medications on file     Benjiman Core, MD 05/20/16 1601

## 2016-05-20 NOTE — Progress Notes (Signed)
05/20/2016 Patient transfer from the emergency room to [facility] at 1745. He is alert, oriented to person, time, situation and place. Patient have bilateral PE and is on a heparin gtt. Reported by wife he fell a year ago and a couple months ago slip out the chair. He received yellow socks, and armband. In the crack area of sacrum red and pink. Heels is dry and flaky, discoloration on bilateral legs. He was given CHG bath, MRSA swab was completed. Central monitoring was about patient being placed on telemetry. Lovie Macadamia RN

## 2016-05-20 NOTE — H&P (Signed)
Family Medicine Teaching [facility] Admission History and Physical Service Pager: [phone]  Patient name: Andrew Winters Medical record number: [id number] Date of birth: [birth year] Age: 75 y.o. Gender: male  Primary Care Provider: Crist Fat, MD Consultants: IR, CCM Code Status: Full (confirmed on admission)  Chief Complaint: "Left sided chest pain"  Assessment and Plan: Andrew Winters is a 75 y.o. male presenting with left-sided pleuritic chest pain. PMH is significant for h/o DVT, DMT2, HTN, HLD, obesity, depression, cognitive impairment.  #Pulmonary Embolism / Pleuritic Chest Pain, Acute, Stable: New onset. Vitals signs on admission within normal limits, tachypneic in the ED but normal work of breathing now with O2 in the high 90's on room air. Patient stable on presentation with only left left-sided chest pain.BMP and CBC within normal limits. D Dimer >20, CTA shows submassive bilateral pulmonary emboli which are partially occlusive with evidence of right heart strain. IR and CCM aware and consulted in ED. Heparin bolus initiated in ED. Receiving heparin infusion. Troponin neg. Risk factors include former smoker 20 pack years, obesity, sedentary lifestyle, h/o previous DVT in left upper arm after a knee surgery in 2016 (placed on Lovenox bridge to coumadin at that time). Patient may have underlying malignancy that we are not aware of. Not currently on any anticoagulation at home. Patient will remain on heparin drip with close monitoring of cardiorespiratory status. --Admit to SDU, attending Dr. Pollie Meyer - Vital signs per floor protocol --Heparin infusion 100 units/hr per pharmacy, s/p heparin bolus --Will obtain ECHO given right heart strain 2/2 submassive PE --Tylenol and Tramadol PRN pain - Transition to NOAC on 11/11, will need lifelong anticoagulation at this point - PT/OT   #Diabetes Mellitus Type 2 / Peripheral Neuropathy, Chronic, Stable: Takes Amaryl and metformin as  home. Last A1c 7.0 on 01/2015. --Hold metformin and Amaryl --Moderate SSI --Monitor CBGs  #Hypertension, Chronic, Stable: BP 110-120/70 on admission. Takes amlodipine, lasix, and lisinopril Has 20 pack year hx, former smoker 2 years ago.  --Amlodipine 5 mg QD --Lasix 20 mg QD --Lisinopril 10 mg QD --Gabapentin 100 mg TID  #GERD, Chronic, Stable: Takes omeprazole at home. --Protonix 40 mg QD  #Hyperlipidemia, Chronic, Stable: Takes atorvastatin at home. --Lipitor 40 mg QD  #Depression, Chronic, Stable: Denies depressive symptoms on admission. Takes zoloft and trazodone at home. --Zoloft 25 mg QD --Trazodone 50 mg QD  #Mild Cognitive Impairment, Chronic, Stable: Wife says patient is loosing his memory and is hard of hearing. --Continue home Donepezil 10 mg  FEN/GI: Diabetic diet Prophylaxis: Heparin ggt per pharmacy  Disposition: Pending improvement of sub-massive PE  History of Present Illness:  Andrew Winters is a 75 y.o. male presenting with left-sided pleuritic chest pain. PMH is significant for h/o DVT, DMT2, HTN, HLD, obesity, depression, cognitive impairment.  Patient developed left-sided chest pain under axilla yesterday evening around midnight while in bed. He initially had dyspnea and developed a productive cough with clear sputum. Patient denies any previous increased leg swelling compared to his baseline. He does endorse pain near his left knee earlier this week. Patient ambulates with a cane and has a sedentary lifestyle. Denies recent travel. previous smoker 2 years ago, 20 pack years. No family history of clots. No recent surgeries but was due for a hernia repair in a few weeks and had a stress ECHO 2 weeks ago though there is none on file. Patient states he experienced a right upper extremity blood clot during his right knee surgery on 12/2014. Denies fevers, chills, nausea,  or dyspnea at present.  Review Of Systems: Complete review of symptoms performed. See HPI  for pertinent.  Patient Active Problem List   Diagnosis Date Noted  . Pulmonary emboli (HCC) 05/20/2016  . Pseudogout of right knee 01/13/2015  . DVT of upper extremity (deep vein thrombosis): LEFT 01/11/2015  . Anemia, iron deficiency 01/11/2015  . Postoperative fever   . Fever 01/08/2015  . Diabetes mellitus type 2, controlled (HCC) 01/07/2015  . Essential hypertension 01/07/2015  . SIRS (systemic inflammatory response syndrome) (HCC) 01/07/2015  . Acute encephalopathy   . Primary osteoarthritis of right knee 01/06/2015  . Diabetes (HCC) 01/06/2015  . Morbid obesity (HCC) 01/06/2015  . DIABETIC PERIPHERAL NEUROPATHY 01/05/2010  . ANKLE PAIN, LEFT 01/05/2010  . HEADACHE 05/07/2009  . UNSPECIFIED VITAMIN D DEFICIENCY 02/03/2009  . LIVER FUNCTION TESTS, ABNORMAL 07/19/2007  . HYPERTHYROIDISM 01/04/2007  . DIABETES MELLITUS, TYPE II 01/04/2007  . HYPERLIPIDEMIA 01/04/2007  . ERECTILE DYSFUNCTION 01/04/2007  . DEPRESSION 01/04/2007  . HYPERTENSION 01/04/2007  . GERD 01/04/2007  . BENIGN PROSTATIC HYPERTROPHY 01/04/2007  . OSTEOARTHRITIS 01/04/2007    Past Medical History: Past Medical History:  Diagnosis Date  . Acute saddle pulmonary embolism (HCC) 05/20/2016  . Anxiety   . Bilateral swelling of feet   . BPH (benign prostatic hyperplasia)    no meds needed per medical md  . Cataracts, bilateral    immature  . Chronic diarrhea   . Deafness    in left ear   . Depression    takes Zoloft daily  . Diabetes mellitus    takes Actos and Amaryl daily as well as Onglyza  . Dysphagia   . Fatigue   . GERD (gastroesophageal reflux disease)    takes Omeprazole daily  . Headaches, cluster   . Hiatal hernia   . Hyperlipemia    was on meds but hasnt been on anything in about 46months per MD  . Hypertension    takes Amlodipine,HCTZ,and Lisinopril daily  . Hyperthyroidism   . Insomnia    takes Trazodone nightly  . Joint pain   . Joint swelling   . Muscle pain   . Nocturia    . Osteoarthritis   . Peripheral neuropathy (HCC)    takes Gabapentin daily  . Pneumonia 3+ yrs ago  . Presbyesophagus   . Rheumatoid arthritis(714.0)   . Vitamin D deficiency    takes Vit D daily    Past Surgical History: Past Surgical History:  Procedure Laterality Date  . BACK SURGERY  1998  . COLONOSCOPY WITH ESOPHAGOGASTRODUODENOSCOPY (EGD) AND ESOPHAGEAL DILATION (ED)    . IRRIGATION AND DEBRIDEMENT KNEE Right 01/12/2015  . TOTAL KNEE ARTHROPLASTY Right 01/06/2015   Procedure: TOTAL KNEE ARTHROPLASTY;  Surgeon: Marcene Corning, MD;  Location: [facility] OR;  Service: Orthopedics;  Laterality: Right;    Social History: Social History  Substance Use Topics  . Smoking status: Former Smoker    Packs/day: 1.00    Years: 35.00    Types: Cigarettes    Quit date: 10/15/2014  . Smokeless tobacco: Never Used     Comment: pack last about 2-3wks  . Alcohol use No   Additional social history: lives at home with wife. Wife does most of the daily chores, patient uses cane to ambulate but can perform ADLs. Please also refer to relevant sections of EMR.  Family History: Family History  Problem Relation Age of Onset  . Heart disease Mother   . Diabetes Mother   . Heart attack Father   .  Diabetes Brother   . Colon cancer Neg Hx    No family history of clots.  Allergies and Medications: Allergies  Allergen Reactions  . Aspirin Diarrhea   No current facility-administered medications on file prior to encounter.    Current Outpatient Prescriptions on File Prior to Encounter  Medication Sig Dispense Refill  . acetaminophen (TYLENOL) 500 MG tablet Take 1,000 mg by mouth every 6 (six) hours as needed for mild pain, moderate pain or headache.     Marland Kitchen atorvastatin (LIPITOR) 40 MG tablet Take 40 mg by mouth at bedtime.     . cetirizine (ZYRTEC) 10 MG tablet Take 10 mg by mouth daily.    . cholecalciferol (VITAMIN D) 1000 units tablet Take 1,000 Units by mouth 2 (two) times daily.    Marland Kitchen docusate  sodium (COLACE) 100 MG capsule Take 100 mg by mouth 2 (two) times daily as needed for mild constipation.    Marland Kitchen donepezil (ARICEPT) 10 MG tablet Take 10 mg by mouth at bedtime.    . ferrous sulfate 325 (65 FE) MG tablet Take 325 mg by mouth daily with breakfast.    . fluticasone (FLONASE) 50 MCG/ACT nasal spray Place 2 sprays into both nostrils daily as needed for rhinitis.     . furosemide (LASIX) 20 MG tablet Take 20 mg by mouth daily.    Marland Kitchen gabapentin (NEURONTIN) 100 MG capsule Take 100 mg by mouth 3 (three) times daily.      Marland Kitchen glimepiride (AMARYL) 4 MG tablet Take 4 mg by mouth 2 (two) times daily.     Marland Kitchen lisinopril (PRINIVIL,ZESTRIL) 10 MG tablet Take 10 mg by mouth daily.     . potassium chloride (K-DUR,KLOR-CON) 10 MEQ tablet Take 10 mEq by mouth daily.    . ranitidine (ZANTAC) 150 MG tablet Take 150 mg by mouth at bedtime.    . sertraline (ZOLOFT) 25 MG tablet Take 25 mg by mouth daily.     . traMADol (ULTRAM) 50 MG tablet Take 50 mg by mouth every 8 (eight) hours as needed for moderate pain.    . traZODone (DESYREL) 50 MG tablet Take 50 mg by mouth at bedtime.    . vitamin E 400 UNIT capsule Take 400 Units by mouth daily.      Objective: BP 138/82 (BP Location: Left Arm)   Pulse 92   Temp 98.3 F (36.8 C) (Oral)   Resp (!) 27   Ht 6\' 1"  (1.854 m)   Wt 268 lb 9.6 oz (121.8 kg)   SpO2 95%   BMI 35.44 kg/m  Exam: General: obese, well nourished, well developed, in no acute distress with non-toxic appearance. Hard of hearing  HEENT: normocephalic, atraumatic, moist mucous membranes Neck: supple, non-tender without lymphadenopathy CV: regular rate and rhythm without murmurs, rubs, or gallops, chest pain no reproducible on palpation, no pitting edema Lungs: clear to auscultation bilaterally with normal work of breathing Abdomen: soft, non-tender, normoactive bowel sounds, reducible umbilical hernia Skin: warm, dry, no rashes or lesions, cap refill < 2 seconds Extremities: warm and  well perfused, normal tone, calves equal in size and non tender on palpation. Negative Homan's sign bilaterally  Labs and Imaging: CBC BMET   Recent Labs Lab 05/20/16 1058  WBC 6.3  HGB 13.1  HCT 40.4  PLT 153    Recent Labs Lab 05/20/16 1058  NA 139  K 4.3  CL 105  CO2 22  BUN 10  CREATININE 1.16  GLUCOSE 136*  CALCIUM 9.0  DG Chest 2 View (05/20/16) FINDINGS: Cardiomediastinal silhouette is unremarkable. There is elevation of the right hemidiaphragm. There is streaky left base retrocardiac atelectasis or infiltrate best seen on lateral view. Degenerative changes thoracic spine. No pulmonary edema.  IMPRESSION: Streaky left base retrocardiac atelectasis or infiltrate. No pulmonary edema. Degenerative changes thoracic spine.  CT Angio Chest PE W and/or Wo Contrast (05/20/16) FINDINGS: Cardiovascular: The pulmonary is are well opacified. There are bilateral pulmonary emboli present. There is a saddle embolus noted crossing from the right to the left pulmonary arteries. Large emboli are noted partially occluding the branches of the left pulmonary artery to the left lower lobe and lingula. Also there are partially occlusive pulmonary emboli involving branches of the right pulmonary artery to the right lower lobe, right middle lobe, and right upper lobe. The RV/LV ratio is 1.1 which is abnormal and does indicate right heart strain which is associated with increased morbidity and mortality. Cardiomegaly is stable.  Mediastinum/Nodes: No mediastinal or hilar adenopathy is seen. There is some fluid in the esophagus which could be due to reflux with slight thickening of of the mucosa of the distal esophagus. Correlate clinically.  Lungs/Pleura: On lung window images, nodular pleural opacities primarily near the apices appear stable. Calcification of some of this thickened for in the left upper lung apex is stable. No new parenchymal opacity is seen. No pleural  effusion is noted. Thickened pleura in the lower hemithoraces also is noted left-greater-than-right.  Upper Abdomen: The upper abdomen is unremarkable. There may be some sludge layering within the gallbladder. Also there is slight prominence of the left adrenal gland most likely due to incidental adrenal adenoma which is unchanged.  Musculoskeletal: There are degenerative changes throughout the thoracic spine. No compression deformity is seen.  Review of the MIP images confirms the above findings.  IMPRESSION: 1. Bilateral pulmonary emboli which are partially occlusive with evidence of right heart strain. Positive for acute PE with CT evidence of right heart strain (RV/LV Ratio = ) consistent with at least submassive (intermediate risk) PE. The presence of right heart strain has been associated with an increased risk of morbidity and mortality. Please activate Code PE by paging [phone]. 2. Stable nodule or pleural thickening deep in the lung apices with some calcification most likely chronic in nature. 3. Question gastroesophageal reflux with some fluid in the esophagus. 4. Critical Value/emergent results were called by telephone at the time of interpretation on 05/20/2016 at 2:57 pm to Dr. Benjiman Core , who verbally acknowledged these results.  Wendee Beavers, DO 05/20/2016, 7:15 PM PGY-1, [facility] FPTS Intern pager: [phone], text pages welcome  I have separately seen and examined the patient. I have discussed the findings and exam with Dr Abelardo Diesel and agree with the above note.  My changes/additions are outlined in BLUE.   Anders Simmonds, MD [facility], PGY-2

## 2016-05-20 NOTE — Progress Notes (Signed)
ANTICOAGULATION CONSULT NOTE - Initial Consult  Pharmacy Consult for heparin Indication: pulmonary embolus  Allergies  Allergen Reactions  . Aspirin Diarrhea    Patient Measurements: Height: 6\' 1"  (185.4 cm) Weight: 273 lb (123.8 kg) IBW/kg (Calculated) : 79.9 Heparin Dosing Weight: 107kg  Vital Signs: Temp: 98.6 F (37 C) (11/10 1055) Temp Source: Oral (11/10 1055) BP: 119/77 (11/10 1400) Pulse Rate: 87 (11/10 1400)  Labs:  Recent Labs  05/20/16 1058  HGB 13.1  HCT 40.4  PLT 153  CREATININE 1.16    Estimated Creatinine Clearance: 75.9 mL/min (by C-G formula based on SCr of 1.16 mg/dL).   Medical History: Past Medical History:  Diagnosis Date  . Anxiety   . Bilateral swelling of feet   . BPH (benign prostatic hyperplasia)    no meds needed per medical md  . Cataracts, bilateral    immature  . Chronic diarrhea   . Deafness    in left ear   . Depression    takes Zoloft daily  . Diabetes mellitus    takes Actos and Amaryl daily as well as Onglyza  . Dysphagia   . Fatigue   . GERD (gastroesophageal reflux disease)    takes Omeprazole daily  . Headaches, cluster   . Hiatal hernia   . Hyperlipemia    was on meds but hasnt been on anything in about 52months per MD  . Hypertension    takes Amlodipine,HCTZ,and Lisinopril daily  . Hyperthyroidism   . Insomnia    takes Trazodone nightly  . Joint pain   . Joint swelling   . Muscle pain   . Nocturia   . Osteoarthritis   . Peripheral neuropathy (HCC)    takes Gabapentin daily  . Pneumonia 3+ yrs ago  . Presbyesophagus   . Rheumatoid arthritis(714.0)   . Vitamin D deficiency    takes Vit D daily    Medications:  Infusions:  . heparin      Assessment: 75 yom presented to the ED with chest pressure. Found to have PE. To start IV heparin for anticoagulation. Baseline CBC is WNL and pt is not on anticoagulation PTA.   Goal of Therapy:  Heparin level 0.3-0.7 units/ml Monitor platelets by  anticoagulation protocol: Yes   Plan:  - Heparin bolus 5500 units IV x 1 - Heparin gtt 1600 units/hr - Check an 8 hr heparin level - Daily heparin level and CBC  Jaelene Garciagarcia, 1month 05/20/2016,2:46 PM

## 2016-05-21 DIAGNOSIS — I2692 Saddle embolus of pulmonary artery without acute cor pulmonale: Principal | ICD-10-CM

## 2016-05-21 LAB — CBC
HEMATOCRIT: 37.6 % — AB (ref 39.0–52.0)
HEMOGLOBIN: 11.8 g/dL — AB (ref 13.0–17.0)
MCH: 26.8 pg (ref 26.0–34.0)
MCHC: 31.4 g/dL (ref 30.0–36.0)
MCV: 85.3 fL (ref 78.0–100.0)
Platelets: 140 10*3/uL — ABNORMAL LOW (ref 150–400)
RBC: 4.41 MIL/uL (ref 4.22–5.81)
RDW: 16.6 % — AB (ref 11.5–15.5)
WBC: 5.5 10*3/uL (ref 4.0–10.5)

## 2016-05-21 LAB — BASIC METABOLIC PANEL
Anion gap: 9 (ref 5–15)
BUN: 9 mg/dL (ref 6–20)
CHLORIDE: 103 mmol/L (ref 101–111)
CO2: 27 mmol/L (ref 22–32)
CREATININE: 1.04 mg/dL (ref 0.61–1.24)
Calcium: 8.7 mg/dL — ABNORMAL LOW (ref 8.9–10.3)
GFR calc Af Amer: >60 mL/min (ref >60)
GFR calc non Af Amer: >60 mL/min (ref >60)
GLUCOSE: 131 mg/dL — AB (ref 65–99)
Potassium: 3.7 mmol/L (ref 3.5–5.1)
SODIUM: 139 mmol/L (ref 135–145)

## 2016-05-21 LAB — GLUCOSE, CAPILLARY
GLUCOSE-CAPILLARY: 194 mg/dL — AB (ref 65–99)
Glucose-Capillary: 122 mg/dL — ABNORMAL HIGH (ref 65–99)
Glucose-Capillary: 160 mg/dL — ABNORMAL HIGH (ref 65–99)
Glucose-Capillary: 156 mg/dL — ABNORMAL HIGH (ref 65–99)

## 2016-05-21 LAB — HEPARIN LEVEL (UNFRACTIONATED)
Heparin Unfractionated: 0.69 IU/mL (ref 0.30–0.70)
Heparin Unfractionated: 0.79 IU/mL — ABNORMAL HIGH (ref 0.30–0.70)

## 2016-05-21 MED ORDER — ACETAMINOPHEN 650 MG RE SUPP
650.0000 mg | Freq: Four times a day (QID) | RECTAL | Status: DC
  Filled 2016-05-21: qty 1

## 2016-05-21 MED ORDER — ACETAMINOPHEN 325 MG PO TABS
650.0000 mg | ORAL_TABLET | Freq: Four times a day (QID) | ORAL | Status: DC
  Administered 2016-05-21 – 2016-05-24 (×12): 650 mg via ORAL
  Filled 2016-05-21 (×12): qty 2

## 2016-05-21 NOTE — Progress Notes (Signed)
ANTICOAGULATION CONSULT NOTE - Follow Up Consult  Pharmacy Consult for Heparin  Indication: pulmonary embolus  Allergies  Allergen Reactions  . Aspirin Diarrhea   Patient Measurements: Height: 6\' 1"  (185.4 cm) Weight: 270 lb 1 oz (122.5 kg) IBW/kg (Calculated) : 79.9  Vital Signs: Temp: 98.2 F (36.8 C) (11/11 0800) Temp Source: Oral (11/11 0800) BP: 123/77 (11/11 0800) Pulse Rate: 72 (11/11 0800)  Labs:  Recent Labs  05/20/16 1058 05/20/16 2309 05/21/16 0425 05/21/16 0922  HGB 13.1  --  11.8*  --   HCT 40.4  --  37.6*  --   PLT 153  --  140*  --   HEPARINUNFRC  --  0.84*  --  0.69  CREATININE 1.16  --  1.04  --    Estimated Creatinine Clearance: 84.1 mL/min (by C-G formula based on SCr of 1.04 mg/dL).  Assessment: 75 y/o M with chest pressure found to have new onset bilateral pulmonary emboli. Started on heparin. Initial heparin level slightly above therapeutic range with repeat near upper limit of goal range (0.69). Considering clot burden, will leave rate as is and order confirmatory lab for later today. No issues reported at this time.   Goal of Therapy:  Heparin level 0.3-0.7 units/ml Monitor platelets by anticoagulation protocol: Yes   Plan:  -Continue heparin to 1450 units/hr -1700 HL  Keylon Labelle L Cana Mignano 05/21/2016,12:52 PM

## 2016-05-21 NOTE — Progress Notes (Signed)
Family Medicine Teaching Service Daily Progress Note Intern Pager: [phone]  Patient name: Andrew Winters Medical record number: [id number] Date of birth: 06/03/1941 Age: 75 y.o. Gender: male  Primary Care Provider: Crist Fat, MD Consultants: None Code Status: Full  Pt Overview and Major Events to Date:  11/10: Admit to SDU for b/l PE 11/11: Continue to monitor inpatient on Heparin  Assessment and Plan:  Andrew Winters is a 75 y.o. male presenting with left-sided pleuritic chest pain. PMH is significant for h/o DVT, DMT2, HTN, HLD, obesity, depression, cognitive impairment.  Bilateral Pulmonary Embolism w/ Pleuritic Chest Pain:  Vitals normal overnight, slight tachypnea this morning, placed on [place], no hypoxia documented. On Heparin ggt per pharmacy for PE.Still unclear of cause. Pain controlled with home regimen. CCM aware of patient, stated ok for floor. IR aware of patient as well  -Continue to monitor in SDU - Vital signs per floor protocol - ECHO given right heart strain 2/2 submassive PE -Tylenol and Tramadol PRN pain. Will schedule Tylenol as patient has not been asking for pain medication - Will need to transition off Heparin, will discuss plan with IR - PT/OT  - IR recs  Diabetes Mellitus Type 2 / Peripheral Neuropathy, Chronic, Stable: Takes Amaryl and metformin as home. Last A1c 7.0 on 01/2015. Glucose 131 his AM, no SSI given.  --Hold metformin and Amaryl --Moderate SSI --Monitor CBGs  Hx of Hypertension: Normotensive. Takes amlodipine, lasix, and lisinopril Has 20 pack year hx, former smoker 2 years ago.  --Amlodipine 5 mg QD --Lasix 20 mg QD --Lisinopril 10 mg QD  GERD, Chronic, Stable: Takes omeprazole at home. --Protonix 40 mg QD  Hyperlipidemia, Chronic, Stable: Takes atorvastatin at home. --Lipitor 40 mg QD  Depression, Chronic, Stable: Denies depressive symptoms on admission. Takes zoloft and trazodone at home. --Zoloft 25 mg QD --Trazodone  50 mg QD  Mild Cognitive Impairment, Chronic, Stable: Wife says patient is loosing his memory and is hard of hearing. --Continue home Donepezil 10 mg  Mild thrombocytopenia: plt 153>140.  - Continue to keep close eye on platelets while on heparin  FEN/GI: Diabetic diet Prophylaxis: Heparin ggt per pharmacy   Subjective:  Patient states he is feeling better this morning. Is still having pain in his left side that he rates 6/10. Has not had any PRN Tylenol. States his appetite is not that great this morning. Has not had a bowel movement yet.   Objective: Temp:  [97.8 F (36.6 C)-98.8 F (37.1 C)] 98.7 F (37.1 C) (11/11 0449) Pulse Rate:  [69-109] 72 (11/11 0800) Resp:  [14-34] 26 (11/11 0800) BP: (86-144)/(59-82) 123/77 (11/11 0800) SpO2:  [93 %-98 %] 95 % (11/11 0800) Weight:  [268 lb 9.6 oz (121.8 kg)-273 lb (123.8 kg)] 270 lb 1 oz (122.5 kg) (11/11 0446) Physical Exam: General: Obese gentleman sitting up in bed watching TV, in no acute distress Cardiovascular: Regular rate and rhythm, no edema Respiratory: Slightly increased respiratory rate, no accessory muscle use, clear to auscultation bilaterally Abdomen: Soft, nondistended, slightly tender to palpation in epigastric region, normal bowel sounds Extremities: Moves all extremities spontaneously, negative Homan sign bilaterally  Laboratory:  Recent Labs Lab 05/20/16 1058 05/21/16 0425  WBC 6.3 5.5  HGB 13.1 11.8*  HCT 40.4 37.6*  PLT 153 140*    Recent Labs Lab 05/20/16 1058 05/21/16 0425  NA 139 139  K 4.3 3.7  CL 105 103  CO2 22 27  BUN 10 9  CREATININE 1.16 1.04  CALCIUM 9.0 8.7*  GLUCOSE 136* 131*    Imaging/Diagnostic Tests: Dg Chest 2 View  Result Date: 05/20/2016 CLINICAL DATA:  Left side chest pain, shortness of Breath EXAM: CHEST  2 VIEW COMPARISON:  10/08/2015 FINDINGS: Cardiomediastinal silhouette is unremarkable. There is elevation of the right hemidiaphragm. There is streaky left base  retrocardiac atelectasis or infiltrate best seen on lateral view. Degenerative changes thoracic spine. No pulmonary edema. IMPRESSION: Streaky left base retrocardiac atelectasis or infiltrate. No pulmonary edema. Degenerative changes thoracic spine. Electronically Signed   By: Natasha Mead M.D.   On: 05/20/2016 11:54   Ct Angio Chest Pe W And/or Wo Contrast  Result Date: 05/20/2016 CLINICAL DATA:  Left chest pressure, elevated D-dimer EXAM: CT ANGIOGRAPHY CHEST WITH CONTRAST TECHNIQUE: Multidetector CT imaging of the chest was performed using the standard protocol during bolus administration of intravenous contrast. Multiplanar CT image reconstructions and MIPs were obtained to evaluate the vascular anatomy. CONTRAST:  69 cc Isovue 370 COMPARISON:  CT angio chest of 01/08/2015 FINDINGS: Cardiovascular: The pulmonary is are well opacified. There are bilateral pulmonary emboli present. There is a saddle embolus noted crossing from the right to the left pulmonary arteries. Large emboli are noted partially occluding the branches of the left pulmonary artery to the left lower lobe and lingula. Also there are partially occlusive pulmonary emboli involving branches of the right pulmonary artery to the right lower lobe, right middle lobe, and right upper lobe. The RV/LV ratio is 1.1 which is abnormal and does indicate right heart strain which is associated with increased morbidity and mortality. Cardiomegaly is stable. Mediastinum/Nodes: No mediastinal or hilar adenopathy is seen. There is some fluid in the esophagus which could be due to reflux with slight thickening of of the mucosa of the distal esophagus. Correlate clinically. Lungs/Pleura: On lung window images, nodular pleural opacities primarily near the apices appear stable. Calcification of some of this thickened for in the left upper lung apex is stable. No new parenchymal opacity is seen. No pleural effusion is noted. Thickened pleura in the lower hemithoraces  also is noted left-greater-than-right. Upper Abdomen: The upper abdomen is unremarkable. There may be some sludge layering within the gallbladder. Also there is slight prominence of the left adrenal gland most likely due to incidental adrenal adenoma which is unchanged. Musculoskeletal: There are degenerative changes throughout the thoracic spine. No compression deformity is seen. Review of the MIP images confirms the above findings. IMPRESSION: 1. Bilateral pulmonary emboli which are partially occlusive with evidence of right heart strain. Positive for acute PE with CT evidence of right heart strain (RV/LV Ratio = ) consistent with at least submassive (intermediate risk) PE. The presence of right heart strain has been associated with an increased risk of morbidity and mortality. Please activate Code PE by paging [phone]. 2. Stable nodule or pleural thickening deep in the lung apices with some calcification most likely chronic in nature. 3. Question gastroesophageal reflux with some fluid in the esophagus. 4. Critical Value/emergent results were called by telephone at the time of interpretation on 05/20/2016 at 2:57 pm to Dr. Benjiman Core , who verbally acknowledged these results. Electronically Signed   By: Dwyane Dee M.D.   On: 05/20/2016 14:57     Beaulah Dinning, MD 05/21/2016, 8:40 AM PGY-2, [facility] FPTS Intern pager: [phone], text pages welcome

## 2016-05-21 NOTE — Progress Notes (Signed)
OT Cancellation Note  Patient Details Name: Andrew Winters MRN: [id number] DOB: August 04, 1940   Cancelled Treatment:     Pt. Has B PE and has not been on anti-coagulant for 24 hours. OT to hold eval.   Shayla Heming 05/21/2016, 11:39 AM

## 2016-05-21 NOTE — Plan of Care (Signed)
Problem: Pain Managment: Goal: General experience of comfort will improve Outcome: Progressing Patient complaining of pain in Left lung area. Relieved with tramadol overnight.   Problem: Tissue Perfusion: Goal: Risk factors for ineffective tissue perfusion will decrease Outcome: Progressing Patient on heparin gtt for known PE.   Problem: Activity: Goal: Risk for activity intolerance will decrease Outcome: Progressing PT and OT orders placed for patient. Patient currently demonstrating ability to pivot.

## 2016-05-21 NOTE — Progress Notes (Signed)
ANTICOAGULATION CONSULT NOTE - Follow Up Consult  Pharmacy Consult for Heparin  Indication: pulmonary embolus  Allergies  Allergen Reactions  . Aspirin Diarrhea   Patient Measurements: Height: 6\' 1"  (185.4 cm) Weight: 270 lb 1 oz (122.5 kg) IBW/kg (Calculated) : 79.9  Vital Signs: Temp: 98.8 F (37.1 C) (11/11 1300) Temp Source: Oral (11/11 1300) BP: 114/67 (11/11 1627) Pulse Rate: 75 (11/11 1627)  Labs:  Recent Labs  05/20/16 1058 05/20/16 2309 05/21/16 0425 05/21/16 0922 05/21/16 1648  HGB 13.1  --  11.8*  --   --   HCT 40.4  --  37.6*  --   --   PLT 153  --  140*  --   --   HEPARINUNFRC  --  0.84*  --  0.69 0.79*  CREATININE 1.16  --  1.04  --   --    Estimated Creatinine Clearance: 84.1 mL/min (by C-G formula based on SCr of 1.04 mg/dL).  Assessment: 75 y/o M with chest pressure found to have new onset bilateral pulmonary emboli. Heparin level above therapeutic range after previous level was therapeutic. Will reduce rate and follow up with 8 hour level. No issues reported at this time.   Goal of Therapy:  Heparin level 0.3-0.7 units/ml Monitor platelets by anticoagulation protocol: Yes   Plan:  -Reduce heparin gtt to 1300 units/hr -0100 HL  Odalis Jordan L Zafira Munos 05/21/2016,5:19 PM

## 2016-05-21 NOTE — Progress Notes (Signed)
Spoke with Dr. Fredia Sorrow, interventional radiologist, on the phone about patient. Since patient has been stable without hypoxia or significant tachypnea he is likely not a candidate for any interventional radiology procedure. Additionally he had no troponin leak which is reassuring. He stated to continue watching vitals closely and if patient's decompensates then we will need to contact CCM and CCM would coordinate with IR about any intervention. He also agreed with waiting for the echocardiogram to come back before transitioning off of heparin to another anticoagulation therapy. Appreciate Dr. Antonietta Jewel recommendations.  Anders Simmonds, MD [facility], PGY-2

## 2016-05-21 NOTE — Progress Notes (Signed)
ANTICOAGULATION CONSULT NOTE - Follow Up Consult  Pharmacy Consult for Heparin  Indication: pulmonary embolus  Allergies  Allergen Reactions  . Aspirin Diarrhea    Patient Measurements: Height: 6\' 1"  (185.4 cm) Weight: 268 lb 9.6 oz (121.8 kg) IBW/kg (Calculated) : 79.9  Vital Signs: Temp: 98.8 F (37.1 C) (11/10 2350) Temp Source: Oral (11/10 2350) BP: 120/76 (11/10 2350) Pulse Rate: 84 (11/10 1945)  Labs:  Recent Labs  05/20/16 1058 05/20/16 2309  HGB 13.1  --   HCT 40.4  --   PLT 153  --   HEPARINUNFRC  --  0.84*  CREATININE 1.16  --     Estimated Creatinine Clearance: 75.3 mL/min (by C-G formula based on SCr of 1.16 mg/dL).  Assessment: 75 y/o M with chest pressure found to have new onset bilateral pulmonary emboli. Started on heparin. Initial heparin level is just slightly above therapeutic range. No issues per RN.   Goal of Therapy:  Heparin level 0.3-0.7 units/ml Monitor platelets by anticoagulation protocol: Yes   Plan:  -Dec heparin to 1450 units/hr -0800 HL  61 05/21/2016,12:09 AM

## 2016-05-21 NOTE — Progress Notes (Signed)
PT Cancellation Note  Patient Details Name: Andrew Winters MRN: [id number] DOB: 02-Mar-1941   Cancelled Treatment:    Reason Eval/Treat Not Completed: Medical issues which prohibited therapy. Pt admitted secondary to bilateral PE's. Pt has not been treated with heparin for 24 hours yet. PT will continue to f/u with pt for PT evaluation when appropriate.   Alessandra Bevels Maribeth Jiles 05/21/2016, 11:09 AM Deborah Chalk, PT, DPT [phone]

## 2016-05-22 LAB — CBC
HCT: 36.5 % — ABNORMAL LOW (ref 39.0–52.0)
Hemoglobin: 11.5 g/dL — ABNORMAL LOW (ref 13.0–17.0)
MCH: 26.7 pg (ref 26.0–34.0)
MCHC: 31.5 g/dL (ref 30.0–36.0)
MCV: 84.7 fL (ref 78.0–100.0)
PLATELETS: 146 10*3/uL — AB (ref 150–400)
RBC: 4.31 MIL/uL (ref 4.22–5.81)
RDW: 16.4 % — ABNORMAL HIGH (ref 11.5–15.5)
WBC: 5.1 10*3/uL (ref 4.0–10.5)

## 2016-05-22 LAB — GLUCOSE, CAPILLARY
GLUCOSE-CAPILLARY: 154 mg/dL — AB (ref 65–99)
Glucose-Capillary: 152 mg/dL — ABNORMAL HIGH (ref 65–99)
Glucose-Capillary: 160 mg/dL — ABNORMAL HIGH (ref 65–99)
Glucose-Capillary: 127 mg/dL — ABNORMAL HIGH (ref 65–99)

## 2016-05-22 LAB — HEPARIN LEVEL (UNFRACTIONATED)
HEPARIN UNFRACTIONATED: 0.57 [IU]/mL (ref 0.30–0.70)
Heparin Unfractionated: 0.58 IU/mL (ref 0.30–0.70)

## 2016-05-22 NOTE — Progress Notes (Signed)
Family Medicine Teaching Service Daily Progress Note Intern Pager: [phone]  Patient name: Andrew Winters Medical record number: [id number] Date of birth: 1940-08-18 Age: 75 y.o. Gender: male  Primary Care Provider: Crist Fat, MD Consultants: None  Code Status: Full  Pt Overview and Major Events to Date:  11/10: Admit to SDU for b/l PE 11/11: Continue to monitor inpatient on Heparin 11/13: plan for cardiac echo, transition to DOAC, possible DC  Assessment and Plan: 75 y.o.malepresenting with left-sided pleuritic chest pain, found to have bilateral PE. PMH is significant for h/o DVT, DMT2, HTN, HLD, obesity, depression, cognitive impairment.   Bilateral Pulmonary Embolism w/ Pleuritic Chest Pain:  Tachypnic at 23, otherwise vitals WNL overnight.  Patient on heparin gtt per pharmacy for PE.  Pain controlled on home regimen.  - Continue to monitor in SDU - Vital signs per floor protocol - Plan for ECHO today given right heart strain secondary submassive PE - Scheduled Tylenol and Tramadol PRN pain - Continue heparin drip (11/10 - ). Plan to transition to NOAC following Echocardiogram if appropriate.  - PT/OT  - CCM and IR aware of patient  Diabetes Mellitus Type 2 / Peripheral Neuropathy, Chronic, Stable: Takes Amaryl and metformin as home. Last A1c 7.0 on 01/2015. Glucose 119 his AM, no SSI given.  - Hold metformin and Amaryl - ModerateSSI - Monitor CBGs  Mild Cognitive Impairment, Chronic, Stable: Wife says patient is loosing his memory and is hard of hearing. - Continue home Donepezil 10 mg  Mild thrombocytopenia: Platelets improved this AM.  Trend153>140>146>175.  - Monitor closely given Heparin drip  FEN/GI: Diabetic diet Prophylaxis: Heparin ggt per pharmacy  Disposition: Plan for Echo, transition to NOAC, discharge Home with Home heatlh 24 hr supervision/assistance recommended  Subjective:  Pleasant and comfortable this AM. No chest pain, no pain with  inspiration, no palpitations.  No dyspnea.  Denies discomfort.  Objective: Temp:  [98 F (36.7 C)-98.9 F (37.2 C)] 98.9 F (37.2 C) (11/12 2100) Pulse Rate:  [63-73] 63 (11/12 2100) Resp:  [20-29] 23 (11/12 2100) BP: (101-144)/(52-85) 122/74 (11/12 2100) SpO2:  [93 %-98 %] 98 % (11/12 2100) Weight:  [272 lb 0.8 oz (123.4 kg)] 272 lb 0.8 oz (123.4 kg) (11/12 0500) Physical Exam: General: NAD, sits in bed comfortably Cardiovascular: RRR, no m/r/g Respiratory: CTA bil, no W/R/R Abdomen: soft and nontender, nondistended, no HSM Extremities: warm and well-perfused without edema  Laboratory: Platelets:  150 on admission >>> D-Dimer: >20 (11/10)   Recent Labs Lab 05/20/16 1058 05/21/16 0425 05/22/16 0245  WBC 6.3 5.5 5.1  HGB 13.1 11.8* 11.5*  HCT 40.4 37.6* 36.5*  PLT 153 140* 146*    Recent Labs Lab 05/20/16 1058 05/21/16 0425  NA 139 139  K 4.3 3.7  CL 105 103  CO2 22 27  BUN 10 9  CREATININE 1.16 1.04  CALCIUM 9.0 8.7*  GLUCOSE 136* 131*      Imaging/Diagnostic Tests:  CT ANGIOGRAPHY CHEST WITH CONTRAST (05/20/2016) IMPRESSION: 1. Bilateral pulmonary emboli which are partially occlusive with evidence of right heart strain. Positive for acute PE with CT evidence of right heart strain (RV/LV Ratio = ) consistent with at least submassive (intermediate risk) PE. The presence of right heart strain has been associated with an increased risk of morbidity and mortality. Please activate Code PE by paging [phone]. 2. Stable nodule or pleural thickening deep in the lung apices with some calcification most likely chronic in nature. 3. Question gastroesophageal reflux with some fluid  in the esophagus. 4. Critical Value/emergent results were called by telephone at the time of interpretation on 05/20/2016 at 2:57 pm to Dr. Benjiman Core , who verbally acknowledged these results.  Howard Pouch, MD 05/22/2016, 10:13 PM PGY-1, [facility] Family  Medicine FPTS Intern pager: [phone], text pages welcome

## 2016-05-22 NOTE — Progress Notes (Signed)
Family Medicine Teaching Service Daily Progress Note Intern Pager: [phone]  Patient name: Andrew Winters Medical record number: [id number] Date of birth: 11-Mar-1941 Age: 75 y.o. Gender: male  Primary Care Provider: Crist Fat, MD Consultants: None Code Status: Full  Pt Overview and Major Events to Date:  11/10: Admit to SDU for b/l PE 11/11: Continue to monitor inpatient on Heparin  Assessment and Plan: 75 y.o. male presenting with left-sided pleuritic chest pain, found to have bilateral PE. PMH is significant for h/o DVT, DMT2, HTN, HLD, obesity, depression, cognitive impairment.  Bilateral Pulmonary Embolism w/ Pleuritic Chest Pain:   - Continue to monitor in SDU - Vital signs per floor protocol - ECHO given right heart strain 2/2 submassive PE - Scheduled Tylenol and Tramadol PRN pain - Continue heparin drip. Plan to transition to NOAC following Echocardiogram if appropriate.  - PT/OT  - CCM and IR aware of patient.   Diabetes Mellitus Type 2 / Peripheral Neuropathy, Chronic, Stable: Takes Amaryl and metformin as home. Last A1c 7.0 on 01/2015. Glucose 131 his AM, no SSI given.  - Hold metformin and Amaryl - Moderate SSI - Monitor CBGs  Mild Cognitive Impairment, Chronic, Stable: Wife says patient is loosing his memory and is hard of hearing. - Continue home Donepezil 10 mg  Mild thrombocytopenia: Platelets 153>140>146.  - Monitor closely given Heparin drip  FEN/GI: Diabetic diet Prophylaxis: Heparin ggt per pharmacy  Subjective:  Reports breathing is unchanged. Notes chronic abdominal pain from known hernia. Denies dysuria. Denies any acute concerns.   Objective: Temp:  [98 F (36.7 C)-98.8 F (37.1 C)] 98.4 F (36.9 C) (11/12 0344) Pulse Rate:  [65-82] 65 (11/12 0400) Resp:  [22-29] 29 (11/12 0400) BP: (101-144)/(52-88) 144/72 (11/12 0941) SpO2:  [94 %-98 %] 96 % (11/12 0400) Weight:  [272 lb 0.8 oz (123.4 kg)] 272 lb 0.8 oz (123.4 kg) (11/12  0500) Physical Exam: General: resting in bed in no acute distress Cardiovascular: regular rate and rhythm, no edema Respiratory: Slightly increased respiratory rate, no accessory muscle use, clear to auscultation bilaterally Abdomen: Soft, nondistended, mild tenderness in epigastric region, normal bowel sounds Extremities: Moves all extremities spontaneously  Laboratory:  Recent Labs Lab 05/20/16 1058 05/21/16 0425 05/22/16 0245  WBC 6.3 5.5 5.1  HGB 13.1 11.8* 11.5*  HCT 40.4 37.6* 36.5*  PLT 153 140* 146*    Recent Labs Lab 05/20/16 1058 05/21/16 0425  NA 139 139  K 4.3 3.7  CL 105 103  CO2 22 27  BUN 10 9  CREATININE 1.16 1.04  CALCIUM 9.0 8.7*  GLUCOSE 136* 131*  - Heparin 0.84>0.69>0.79>0.57  Imaging/Diagnostic Tests: Ct Angio Chest Pe W And/or Wo Contrast  Result Date: 05/20/2016 CLINICAL DATA:  Left chest pressure, elevated D-dimer EXAM: CT ANGIOGRAPHY CHEST WITH CONTRAST TECHNIQUE: Multidetector CT imaging of the chest was performed using the standard protocol during bolus administration of intravenous contrast. Multiplanar CT image reconstructions and MIPs were obtained to evaluate the vascular anatomy. CONTRAST:  69 cc Isovue 370 COMPARISON:  CT angio chest of 01/08/2015 FINDINGS: Cardiovascular: The pulmonary is are well opacified. There are bilateral pulmonary emboli present. There is a saddle embolus noted crossing from the right to the left pulmonary arteries. Large emboli are noted partially occluding the branches of the left pulmonary artery to the left lower lobe and lingula. Also there are partially occlusive pulmonary emboli involving branches of the right pulmonary artery to the right lower lobe, right middle lobe, and right upper lobe. The RV/LV  ratio is 1.1 which is abnormal and does indicate right heart strain which is associated with increased morbidity and mortality. Cardiomegaly is stable. Mediastinum/Nodes: No mediastinal or hilar adenopathy is seen.  There is some fluid in the esophagus which could be due to reflux with slight thickening of of the mucosa of the distal esophagus. Correlate clinically. Lungs/Pleura: On lung window images, nodular pleural opacities primarily near the apices appear stable. Calcification of some of this thickened for in the left upper lung apex is stable. No new parenchymal opacity is seen. No pleural effusion is noted. Thickened pleura in the lower hemithoraces also is noted left-greater-than-right. Upper Abdomen: The upper abdomen is unremarkable. There may be some sludge layering within the gallbladder. Also there is slight prominence of the left adrenal gland most likely due to incidental adrenal adenoma which is unchanged. Musculoskeletal: There are degenerative changes throughout the thoracic spine. No compression deformity is seen. Review of the MIP images confirms the above findings. IMPRESSION: 1. Bilateral pulmonary emboli which are partially occlusive with evidence of right heart strain. Positive for acute PE with CT evidence of right heart strain (RV/LV Ratio = ) consistent with at least submassive (intermediate risk) PE. The presence of right heart strain has been associated with an increased risk of morbidity and mortality. Please activate Code PE by paging [phone]. 2. Stable nodule or pleural thickening deep in the lung apices with some calcification most likely chronic in nature. 3. Question gastroesophageal reflux with some fluid in the esophagus. 4. Critical Value/emergent results were called by telephone at the time of interpretation on 05/20/2016 at 2:57 pm to Dr. Benjiman Core , who verbally acknowledged these results. Electronically Signed   By: Dwyane Dee M.D.   On: 05/20/2016 14:57   Araceli Bouche, DO 05/22/2016, 12:06 PM PGY-3, [facility] FPTS Intern pager: [phone], text pages welcome

## 2016-05-22 NOTE — Discharge Summary (Signed)
Family Medicine Teaching [facility] Discharge Summary  Patient name: Andrew Winters Medical record number: [id number] Date of birth: 02/07/1941 Age: 75 y.o. Gender: male Date of Admission: 05/20/2016  Date of Discharge: 05/24/2016 Admitting Physician: Latrelle Dodrill, MD  Primary Care Provider: Crist Fat, MD Consultants: None  Indication for Hospitalization: bilateral pulmonary embolism  Discharge Diagnoses/Problem List:  Patient Active Problem List   Diagnosis Date Noted  . Pulmonary emboli (HCC) 05/20/2016  . Acute saddle pulmonary embolism without acute cor pulmonale (HCC) 05/20/2016  . Pseudogout of right knee 01/13/2015  . DVT of upper extremity (deep vein thrombosis): LEFT 01/11/2015  . Anemia, iron deficiency 01/11/2015  . Postoperative fever   . Fever 01/08/2015  . Diabetes mellitus type 2, controlled (HCC) 01/07/2015  . Essential hypertension 01/07/2015  . SIRS (systemic inflammatory response syndrome) (HCC) 01/07/2015  . Acute encephalopathy   . Primary osteoarthritis of right knee 01/06/2015  . Diabetes (HCC) 01/06/2015  . Morbid obesity (HCC) 01/06/2015  . DIABETIC PERIPHERAL NEUROPATHY 01/05/2010  . ANKLE PAIN, LEFT 01/05/2010  . HEADACHE 05/07/2009  . UNSPECIFIED VITAMIN D DEFICIENCY 02/03/2009  . LIVER FUNCTION TESTS, ABNORMAL 07/19/2007  . HYPERTHYROIDISM 01/04/2007  . DIABETES MELLITUS, TYPE II 01/04/2007  . HYPERLIPIDEMIA 01/04/2007  . ERECTILE DYSFUNCTION 01/04/2007  . DEPRESSION 01/04/2007  . HYPERTENSION 01/04/2007  . GERD 01/04/2007  . BENIGN PROSTATIC HYPERTROPHY 01/04/2007  . OSTEOARTHRITIS 01/04/2007    Disposition: Home with Home Health services  Discharge Condition: Stable  Discharge Exam:  Temp:  [97.9 F (36.6 C)-98.4 F (36.9 C)] 98.2 F (36.8 C) (11/14 0900) Pulse Rate:  [66-74] 74 (11/14 0357) Resp:  [17-26] 24 (11/14 0357) BP: (92-127)/(66-74) 99/74 (11/14 0357) SpO2:  [94 %-96 %] 95 % (11/14 0357) Physical  Exam: General: NAD, rests comfortably in bed, no apparent distress Cardiovascular: RRR, no m/r/g Respiratory: CTA bil, no W/R/R Abdomen:  no tenderness to palpation , no rigidity rebound or guarding, normoactive BS, no HSM Extremities: warm and well perfused without edema  Brief Hospital Course:  Patient is a 75 yo M with history of prior post-operative DVT who presented with chest pain and dyspnea, found to have a submassive pulmonary embolism.  He was started on heparin, cardiology and IR were notified.  Patient was hemodynamically stable throughout hospitalization.  Echocardiogram was ordered (results below), and after it resulted patient was transitioned to Xarelto.  Pharmacy worked with the patient to educate regarding xarelto dosing.  Physical therapy and occupational therapy worked with the patient prior to discharge. Home with home health services was the dispo plan. He was stable at the time of discharge.    The day of discharge patient complained of abdominal pain with diarrhea overnight.  C.diff was sent and resulted negative. Upon chart review this appears to be a chronic problem. Patient was nontoxic/well-appearing on exam, abdominal exam was benign.  He was considered stable for discharge with close outpatient followup.   Issues for Follow Up:  1. Donepezil discontinued as this patient was noted to have marked dementia. 2. Prior to discharge patient notes abdominal pain and diarrhea, appears to be chronically with abdominal pain per chart review. Cdiff negative.  As this appears to be chronic, may need to be followed up as outpatient. 3. Patient started on xarelto 15 mg BID for 3 weeks (through December 5) and then transition to xarelto 20 mg once daily  (start December 6). Therapy will likely be lifelong.  Significant Procedures: Cardiac Echo (results below)  Significant Labs and  Imaging:  CT ANGIOGRAPHY CHEST WITH CONTRAST(05/20/2016) IMPRESSION: 1. Bilateral pulmonary emboli  which are partially occlusive with evidence of right heart strain. Positive for acute PE with CT evidence of right heart strain (RV/LV Ratio = ) consistent with at least submassive (intermediate risk) PE. The presence of right heart strain has been associated with an increased risk of morbidity and mortality. Please activate Code PE by paging [phone]. 2. Stable nodule or pleural thickening deep in the lung apices with some calcification most likely chronic in nature. 3. Question gastroesophageal reflux with some fluid in the Esophagus.  Cardiac Echo (05/23/2016) Study Conclusions - Left ventricle: The cavity size was normal. There was mild   concentric hypertrophy. Systolic function was normal. The   estimated ejection fraction was in the range of 55% to 60%. Wall   motion was normal; there were no regional wall motion   abnormalities. Doppler parameters are consistent with abnormal   left ventricular relaxation (grade 1 diastolic dysfunction).   There was no evidence of elevated ventricular filling pressure by   Doppler parameters. - Mitral valve: Mildly thickened leaflets . There was no   regurgitation. - Right ventricle: The cavity size was mildly dilated. Wall   thickness was normal. Systolic function was mildly reduced. - Tricuspid valve: There was trivial regurgitation. - Pulmonary arteries: Systolic pressure was within the normal   range. - Inferior vena cava: The vessel was normal in size. - Pericardium, extracardiac: There was no pericardial effusion.  Impressions: - Limited study quality.   LVEF appears normal.   RV is mildly dilated with mildly decreased systolic function.   RVSP is normal.   Recent Labs Lab 05/22/16 0245 05/23/16 0219 05/24/16 0226  WBC 5.1 5.7 5.8  HGB 11.5* 11.9* 11.5*  HCT 36.5* 37.6* 36.8*  PLT 146* 175 187    Recent Labs Lab 05/20/16 1058 05/21/16 0425 05/23/16 0219 05/24/16 0226  NA 139 139 141 139  K 4.3 3.7 3.7 3.3*   CL 105 103 106 104  CO2 22 27 27 26   GLUCOSE 136* 131* 123* 143*  BUN 10 9 8 10   CREATININE 1.16 1.04 1.02 0.93  CALCIUM 9.0 8.7* 9.0 8.8*    Results/Tests Pending at Time of Discharge: None  Discharge Medications:    Medication List    STOP taking these medications   donepezil 10 MG tablet Commonly known as:  ARICEPT     TAKE these medications   acetaminophen 500 MG tablet Commonly known as:  TYLENOL Take 1,000 mg by mouth every 6 (six) hours as needed for mild pain, moderate pain or headache.   amLODipine 5 MG tablet Commonly known as:  NORVASC Take 5 mg by mouth daily.   atorvastatin 40 MG tablet Commonly known as:  LIPITOR Take 40 mg by mouth at bedtime.   cetirizine 10 MG tablet Commonly known as:  ZYRTEC Take 10 mg by mouth daily.   cholecalciferol 1000 units tablet Commonly known as:  VITAMIN D Take 1,000 Units by mouth 2 (two) times daily.   docusate sodium 100 MG capsule Commonly known as:  COLACE Take 100 mg by mouth 2 (two) times daily as needed for mild constipation.   ferrous sulfate 325 (65 FE) MG tablet Take 325 mg by mouth daily with breakfast.   fluticasone 50 MCG/ACT nasal spray Commonly known as:  FLONASE Place 2 sprays into both nostrils daily as needed for rhinitis.   furosemide 20 MG tablet Commonly known as:  LASIX Take 20 mg  by mouth daily.   gabapentin 100 MG capsule Commonly known as:  NEURONTIN Take 100 mg by mouth 3 (three) times daily.   glimepiride 4 MG tablet Commonly known as:  AMARYL Take 4 mg by mouth 2 (two) times daily.   lisinopril 10 MG tablet Commonly known as:  PRINIVIL,ZESTRIL Take 10 mg by mouth daily.   metFORMIN 500 MG 24 hr tablet Commonly known as:  GLUCOPHAGE-XR Take 500 mg by mouth daily.   omeprazole 20 MG tablet Commonly known as:  PRILOSEC OTC Take 20 mg by mouth 2 (two) times daily.   potassium chloride 10 MEQ tablet Commonly known as:  K-DUR,KLOR-CON Take 10 mEq by mouth daily.    ranitidine 150 MG tablet Commonly known as:  ZANTAC Take 150 mg by mouth at bedtime.   Rivaroxaban 15 MG Tabs tablet Commonly known as:  XARELTO Take 1 tablet (15 mg total) by mouth 2 (two) times daily with a meal.   rivaroxaban 20 MG Tabs tablet Commonly known as:  XARELTO Take 1 tablet (20 mg total) by mouth daily with supper. Take with food starting 06/15/2016 Start taking on:  06/15/2016   sertraline 25 MG tablet Commonly known as:  ZOLOFT Take 25 mg by mouth daily.   traMADol 50 MG tablet Commonly known as:  ULTRAM Take 50 mg by mouth every 8 (eight) hours as needed for moderate pain.   traZODone 50 MG tablet Commonly known as:  DESYREL Take 50 mg by mouth at bedtime.   vitamin E 400 UNIT capsule Take 400 Units by mouth daily.       Discharge Instructions: Please refer to Patient Instructions section of EMR for full details.  Patient was counseled important signs and symptoms that should prompt return to medical care, changes in medications, dietary instructions, activity restrictions, and follow up appointments.   Follow-Up Appointments: Follow-up Information    Crist Fat, MD. Schedule an appointment as soon as possible for a visit in 1 week(s).   Specialty:  Internal Medicine Why:  Please call and make a hospital follow up appointment with your PCP at discharge to be seen within the next week. Contact information: [address] [place] [address] [phone]           Howard Pouch, MD 05/24/2016, 9:16 PM PGY-1, [facility]

## 2016-05-22 NOTE — Progress Notes (Signed)
ANTICOAGULATION CONSULT NOTE  Pharmacy Consult for Heparin  Indication: pulmonary embolus  Allergies  Allergen Reactions  . Aspirin Diarrhea   Patient Measurements: Height: 6\' 1"  (185.4 cm) Weight: 270 lb 1 oz (122.5 kg) IBW/kg (Calculated) : 79.9  Vital Signs: Temp: 98 F (36.7 C) (11/12 0025) Temp Source: Axillary (11/12 0025) BP: 101/52 (11/12 0025) Pulse Rate: 66 (11/12 0025)  Labs:  Recent Labs  05/20/16 1058  05/21/16 0425 05/21/16 0922 05/21/16 1648 05/22/16 0033  HGB 13.1  --  11.8*  --   --   --   HCT 40.4  --  37.6*  --   --   --   PLT 153  --  140*  --   --   --   HEPARINUNFRC  --   < >  --  0.69 0.79* 0.57  CREATININE 1.16  --  1.04  --   --   --   < > = values in this interval not displayed. Estimated Creatinine Clearance: 84.1 mL/min (by C-G formula based on SCr of 1.04 mg/dL).  Assessment: 75 y.o. male with PE for heparin  Goal of Therapy:  Heparin level 0.3-0.7 units/ml Monitor platelets by anticoagulation protocol: Yes   Plan:  Continue Heparin at current rate  Follow-up am labs.   61 05/22/2016,1:08 AM

## 2016-05-22 NOTE — Progress Notes (Signed)
ANTICOAGULATION CONSULT NOTE - Follow Up Consult  Pharmacy Consult for Heparin  Indication: pulmonary embolus  Allergies  Allergen Reactions  . Aspirin Diarrhea   Patient Measurements: Height: 6\' 1"  (185.4 cm) Weight: 272 lb 0.8 oz (123.4 kg) IBW/kg (Calculated) : 79.9  Vital Signs: Temp: 98.4 F (36.9 C) (11/12 1200) Temp Source: Oral (11/12 1200) BP: 123/74 (11/12 1200) Pulse Rate: 73 (11/12 1200)  Labs:  Recent Labs  05/20/16 1058  05/21/16 0425  05/21/16 1648 05/22/16 0033 05/22/16 0245 05/22/16 1310  HGB 13.1  --  11.8*  --   --   --  11.5*  --   HCT 40.4  --  37.6*  --   --   --  36.5*  --   PLT 153  --  140*  --   --   --  146*  --   HEPARINUNFRC  --   < >  --   < > 0.79* 0.57  --  0.58  CREATININE 1.16  --  1.04  --   --   --   --   --   < > = values in this interval not displayed. Estimated Creatinine Clearance: 84.5 mL/min (by C-G formula based on SCr of 1.04 mg/dL).  Assessment: 75 y/o M with chest pressure found to have new onset bilateral pulmonary emboli. Heparin level therapeutic twice. HgB lob but stable at 11.5. No bleeding or infusion related issues reported at this time.   Goal of Therapy:  Heparin level 0.3-0.7 units/ml Monitor platelets by anticoagulation protocol: Yes   Plan:  -Continue heparin gtt at 1300 units/hr -Daily heparin level/CBC -Monitor s/sx of bleeding  [name] 05/22/2016,3:01 PM

## 2016-05-22 NOTE — Progress Notes (Signed)
Earlier this morning, patient claimed that he is not able to feed his self.  NT informed that patient needs assistance in feeding.  NT came to the room and assisted patient to eat.   NT handed him his fork after tray is set-up and he started eating with minimal help.

## 2016-05-22 NOTE — Evaluation (Signed)
Physical Therapy Evaluation Patient Details Name: Andrew Winters MRN: [id number] DOB: February 18, 1941 Today's Date: 05/22/2016   History of Present Illness  Patient is a 75 yo male admitted 05/20/16 with chest pain and SOB.  Patient with bilateral pulmonary emboli.   PMH:  DVT, DM, neuropathy, HTN, HLD, obesity, depression, HOH, cognitive impairment  Clinical Impression  Patient presents with problems listed below.  Will benefit from acute PT to maximize functional mobility prior to discharge home with wife.  Recommend HHPT at d/c for continued therapy.    Follow Up Recommendations Home health PT;Supervision/Assistance - 24 hour    Equipment Recommendations  3in1 (PT)    Recommendations for Other Services       Precautions / Restrictions Precautions Precautions: Fall Restrictions Weight Bearing Restrictions: No      Mobility  Bed Mobility Overal bed mobility: Needs Assistance Bed Mobility: Supine to Sit;Sit to Supine     Supine to sit: Min assist Sit to supine: Min guard   General bed mobility comments: Min assist to raise trunk to sitting  Transfers Overall transfer level: Needs assistance Equipment used: Rolling walker (2 wheeled) Transfers: Sit to/from Stand Sit to Stand: Min assist         General transfer comment: Verbal cues for hand placement.  Assist to power up to standing.   In standing, patient able to step in place 15 steps with each foot.  Repeated x2 with 1 sitting rest break.  O2 sats remained in 90's, and HR from 69 to 82.  Ambulation/Gait                Stairs            Wheelchair Mobility    Modified Rankin (Stroke Patients Only)       Balance Overall balance assessment: Needs assistance Sitting-balance support: No upper extremity supported;Feet supported Sitting balance-Leahy Scale: Fair     Standing balance support: Bilateral upper extremity supported Standing balance-Leahy Scale: Poor                               Pertinent Vitals/Pain Pain Assessment: No/denies pain    Home Living Family/patient expects to be discharged to:: Private residence Living Arrangements: Spouse/significant other Available Help at Discharge: Family;Available 24 hours/day Type of Home: House Home Access: Stairs to enter Entrance Stairs-Rails: Right Entrance Stairs-Number of Steps: 2-3 Home Layout: One level Home Equipment: Walker - 2 wheels;Shower seat      Prior Function Level of Independence: Independent;Needs assistance   Gait / Transfers Assistance Needed: Patient reports he ambulates with no assistive device.  ADL's / Homemaking Assistance Needed: Wife assists patient with bathing "sometimes".  Otherwise independent with ADL's.  Wife manages meal prep and housekeeping.        Hand Dominance        Extremity/Trunk Assessment   Upper Extremity Assessment: Overall WFL for tasks assessed           Lower Extremity Assessment: Generalized weakness (h/o peripheral neuropathy)         Communication   Communication: HOH  Cognition Arousal/Alertness: Awake/alert Behavior During Therapy: WFL for tasks assessed/performed Overall Cognitive Status: History of cognitive impairments - at baseline (Difficulty problem solving)       Memory: Decreased short-term memory              General Comments      Exercises     Assessment/Plan    PT  Assessment Patient needs continued PT services  PT Problem List Decreased strength;Decreased activity tolerance;Decreased balance;Decreased mobility;Decreased knowledge of use of DME;Cardiopulmonary status limiting activity;Impaired sensation;Obesity          PT Treatment Interventions Gait training;DME instruction;Functional mobility training;Therapeutic activities;Therapeutic exercise;Balance training;Patient/family education    PT Goals (Current goals can be found in the Care Plan section)  Acute Rehab PT Goals Patient Stated Goal: To get out of  bed PT Goal Formulation: With patient/family Time For Goal Achievement: 05/29/16 Potential to Achieve Goals: Good    Frequency Min 3X/week   Barriers to discharge        Co-evaluation               End of Session Equipment Utilized During Treatment: Gait belt Activity Tolerance: Patient tolerated treatment well Patient left: in bed;with call bell/phone within reach;with family/visitor present Nurse Communication: Mobility status         Time: 1730-1759 PT Time Calculation (min) (ACUTE ONLY): 29 min   Charges:   PT Evaluation $PT Eval Moderate Complexity: 1 Procedure PT Treatments $Therapeutic Activity: 8-22 mins   PT G Codes:        Vena Austria 2016-06-11, 6:24 PM Durenda Hurt. Renaldo Fiddler, [facility] Acute Rehab Services Pager [phone]

## 2016-05-23 ENCOUNTER — Inpatient Hospital Stay (HOSPITAL_COMMUNITY): Payer: Medicare Other

## 2016-05-23 DIAGNOSIS — I2692 Saddle embolus of pulmonary artery without acute cor pulmonale: Secondary | ICD-10-CM

## 2016-05-23 LAB — ECHOCARDIOGRAM COMPLETE
HEIGHTINCHES: 73 in
WEIGHTICAEL: 4310.43 [oz_av]

## 2016-05-23 LAB — GLUCOSE, CAPILLARY
GLUCOSE-CAPILLARY: 119 mg/dL — AB (ref 65–99)
GLUCOSE-CAPILLARY: 144 mg/dL — AB (ref 65–99)
Glucose-Capillary: 142 mg/dL — ABNORMAL HIGH (ref 65–99)
Glucose-Capillary: 137 mg/dL — ABNORMAL HIGH (ref 65–99)

## 2016-05-23 LAB — CBC
HCT: 37.6 % — ABNORMAL LOW (ref 39.0–52.0)
HEMOGLOBIN: 11.9 g/dL — AB (ref 13.0–17.0)
MCH: 26.6 pg (ref 26.0–34.0)
MCHC: 31.6 g/dL (ref 30.0–36.0)
MCV: 84.1 fL (ref 78.0–100.0)
Platelets: 175 10*3/uL (ref 150–400)
RBC: 4.47 MIL/uL (ref 4.22–5.81)
RDW: 16.1 % — AB (ref 11.5–15.5)
WBC: 5.7 10*3/uL (ref 4.0–10.5)

## 2016-05-23 LAB — BASIC METABOLIC PANEL
Anion gap: 8 (ref 5–15)
BUN: 8 mg/dL (ref 6–20)
CHLORIDE: 106 mmol/L (ref 101–111)
CO2: 27 mmol/L (ref 22–32)
CREATININE: 1.02 mg/dL (ref 0.61–1.24)
Calcium: 9 mg/dL (ref 8.9–10.3)
GFR calc non Af Amer: >60 mL/min (ref >60)
GLUCOSE: 123 mg/dL — AB (ref 65–99)
Potassium: 3.7 mmol/L (ref 3.5–5.1)
Sodium: 141 mmol/L (ref 135–145)

## 2016-05-23 LAB — HEPARIN LEVEL (UNFRACTIONATED): Heparin Unfractionated: 0.49 IU/mL (ref 0.30–0.70)

## 2016-05-23 MED ORDER — PERFLUTREN LIPID MICROSPHERE
1.0000 mL | INTRAVENOUS | Status: AC | PRN
  Administered 2016-05-23: 10 mL via INTRAVENOUS
  Filled 2016-05-23 (×2): qty 10

## 2016-05-23 NOTE — Plan of Care (Signed)
Problem: Pain Managment: Goal: General experience of comfort will improve Outcome: Progressing Patient did not request pain medication on top of scheduled tylenol during PM shift.

## 2016-05-23 NOTE — Progress Notes (Signed)
ANTICOAGULATION CONSULT NOTE - Follow Up Consult  Pharmacy Consult for Heparin  Indication: pulmonary embolus  Allergies  Allergen Reactions  . Aspirin Diarrhea   Patient Measurements: Height: 6\' 1"  (185.4 cm) Weight: 269 lb 6.4 oz (122.2 kg) IBW/kg (Calculated) : 79.9  Vital Signs: Temp: 98.7 F (37.1 C) (11/13 0400) Temp Source: Axillary (11/13 0400) BP: 126/52 (11/13 0600) Pulse Rate: 64 (11/13 0600)   Assessment: 75 y/o M with chest pressure found to have new onset bilateral pulmonary emboli. Pharmacy consulted for heparin gtt. Last HL therapeutic at 0.49. Hgb stable at 11.9, plts wnl. No s/s of bleed.   Goal of Therapy:  Heparin level 0.3-0.7 units/ml Monitor platelets by anticoagulation protocol: Yes   Plan:  Continue heparin gtt at 1,300 units/hr Monitor daily HL, CBC, s/s of bleed F/U transition to NOAC  61, PharmD, [facility] Clinical Pharmacist Pager [phone] 05/23/2016 7:29 AM

## 2016-05-23 NOTE — Evaluation (Signed)
Occupational Therapy Evaluation Patient Details Name: Andrew Winters MRN: [id number] DOB: 1940/07/25 Today's Date: 05/23/2016    History of Present Illness Patient is a 75 yo male admitted 05/20/16 with chest pain and SOB.  Patient with bilateral pulmonary emboli.   PMH:  DVT, DM, neuropathy, HTN, HLD, obesity, depression, HOH, cognitive impairment   Clinical Impression   Pt needed intermittent assist for showering and LB dressing prior to admission. His wife performed all IADL. Pt is sedentary at baseline and uses a lift chair. Pt presents with generalized weakness. His wife has back issues and would like pt to be educated in use of AE for LB ADL. Educated wife that he would likely need cues for using equipment due to impaired memory. Pt with difficulty rising from low surfaces including toilet. Wife stating that his current 3 in 1 is too small and would like an extra wide if insurance with assist. Will follow acutely.    Follow Up Recommendations  Home Health OT;Supervision/Assistance - 24 hour    Equipment Recommendations  3 in 1 bedside comode (extra wide, if insurance will cover)    Recommendations for Other Services       Precautions / Restrictions Precautions Precautions: Fall Restrictions Weight Bearing Restrictions: No      Mobility Bed Mobility Overal bed mobility: Needs Assistance Bed Mobility: Supine to Sit     Supine to sit: Supervision     General bed mobility comments: no physical assist, used rail  Transfers Overall transfer level: Needs assistance Equipment used: Rolling walker (2 wheeled) Transfers: Sit to/from Stand Sit to Stand: Min guard         General transfer comment: min guard for safety, cues for hand placement, elevated bed    Balance     Sitting balance-Leahy Scale: Good       Standing balance-Leahy Scale: Fair                              ADL Overall ADL's : Needs assistance/impaired Eating/Feeding:  Sitting;Set up   Grooming: Min guard;Standing;Wash/dry hands   Upper Body Bathing: Supervision/ safety;Sitting   Lower Body Bathing: Minimal assistance;Sit to/from stand   Upper Body Dressing : Set up;Sitting   Lower Body Dressing: Minimal assistance;Sit to/from stand   Toilet Transfer: Min guard             General ADL Comments: Wife has a bad back and would like pt instructed in use of AE.     Vision     Perception     Praxis      Pertinent Vitals/Pain Pain Assessment: No/denies pain     Hand Dominance Right   Extremity/Trunk Assessment Upper Extremity Assessment Upper Extremity Assessment: Overall WFL for tasks assessed   Lower Extremity Assessment Lower Extremity Assessment: Defer to PT evaluation   Cervical / Trunk Assessment Cervical / Trunk Assessment: Other exceptions (hx of back sx)   Communication Communication Communication: HOH   Cognition Arousal/Alertness: Awake/alert Behavior During Therapy: WFL for tasks assessed/performed Overall Cognitive Status: History of cognitive impairments - at baseline (baseline dementia)       Memory: Decreased short-term memory             General Comments       Exercises       Shoulder Instructions      Home Living Family/patient expects to be discharged to:: Private residence Living Arrangements: Spouse/significant other Available Help at Discharge:  Family;Available 24 hours/day Type of Home: House Home Access: Stairs to enter [employer] of Steps: 2-3 Entrance Stairs-Rails: Right Home Layout: One level     Bathroom Shower/Tub: Producer, television/film/video: Standard     Home Equipment: Environmental consultant - 2 wheels;Shower seat;Bedside commode;Other (comment);Cane - single point (lift chair)          Prior Functioning/Environment Level of Independence: Independent;Needs assistance  Gait / Transfers Assistance Needed: Patient reports he ambulates with no assistive device. ADL's  / Homemaking Assistance Needed: Wife assists patient with bathing and LB dressing "sometimes". Pt stands to shower.  Otherwise independent with ADL's.  Wife manages meal prep and housekeeping.            OT Problem List: Decreased strength;Decreased activity tolerance;Impaired balance (sitting and/or standing);Decreased knowledge of use of DME or AE;Decreased cognition;Decreased safety awareness   OT Treatment/Interventions: Self-care/ADL training;DME and/or AE instruction;Patient/family education;Therapeutic activities    OT Goals(Current goals can be found in the care plan section) Acute Rehab OT Goals Patient Stated Goal: to return home OT Goal Formulation: With patient Time For Goal Achievement: 05/30/16 Potential to Achieve Goals: Good ADL Goals Pt Will Perform Grooming: with supervision;standing Pt Will Perform Lower Body Bathing: with supervision;with adaptive equipment Pt Will Perform Lower Body Dressing: with supervision;with caregiver independent in assisting;with adaptive equipment;sit to/from stand Pt Will Transfer to Toilet: with supervision;ambulating;bedside commode (over toilet) Pt Will Perform Toileting - Clothing Manipulation and hygiene: with supervision;sit to/from stand Pt Will Perform Tub/Shower Transfer: Shower transfer;with supervision;ambulating;shower seat  OT Frequency: Min 2X/week   Barriers to D/C:            Co-evaluation              End of Session Equipment Utilized During Treatment: Rolling walker;Gait belt  Activity Tolerance: Patient tolerated treatment well Patient left: in chair;with call bell/phone within reach;with chair alarm set;with family/visitor present   Time: 1610-9604 OT Time Calculation (min): 23 min Charges:  OT General Charges $OT Visit: 1 Procedure OT Evaluation $OT Eval Moderate Complexity: 1 Procedure OT Treatments $Self Care/Home Management : 8-22 mins G-Codes:    Evern Bio 05/23/2016, 9:40 AM   [phone]

## 2016-05-23 NOTE — Progress Notes (Signed)
  Echocardiogram 2D Echocardiogram has been performed.  Delcie Roch 05/23/2016, 5:41 PM

## 2016-05-23 NOTE — Progress Notes (Signed)
Physical Therapy Treatment Patient Details Name: Andrew Winters MRN: [id number] DOB: [birth year] Today's Date: 05/23/2016    History of Present Illness Patient is a 75 yo male admitted 05/20/16 with chest pain and SOB.  Patient with bilateral pulmonary emboli.   PMH:  DVT, DM, neuropathy, HTN, HLD, obesity, depression, HOH, cognitive impairment    PT Comments    Patient seen for mobility progression. Tolerated ambulation in hall with straight cane today. Modest fatigue but VSS on room air with saturations >91%.  Patient educated on safety with mobility and energy conservation. Will continue to see and progress as tolerated.  Follow Up Recommendations  Home health PT;Supervision/Assistance - 24 hour     Equipment Recommendations  3in1 (PT)    Recommendations for Other Services       Precautions / Restrictions Precautions Precautions: Fall Restrictions Weight Bearing Restrictions: No    Mobility  Bed Mobility               General bed mobility comments: received in chair  Transfers Overall transfer level: Needs assistance Equipment used: Straight cane Transfers: Sit to/from Stand Sit to Stand: Min guard         General transfer comment: min guard for stability in elevation to upright with use of single point cane  Ambulation/Gait Ambulation/Gait assistance: Min guard Ambulation Distance (Feet): 180 Feet Assistive device: Straight cane Gait Pattern/deviations: Step-through pattern;Decreased stride length;Trunk flexed;Drifts right/left Gait velocity: decreased Gait velocity interpretation: Below normal speed for age/gender General Gait Details: slow guarded gait, min guard for stability, no physical assist required. Patient ambulated on room air with modest fatigue, VSS with O2 saturations >92%.   Stairs            Wheelchair Mobility    Modified Rankin (Stroke Patients Only)       Balance   Sitting-balance support: No upper extremity  supported Sitting balance-Leahy Scale: Good     Standing balance support: Single extremity supported Standing balance-Leahy Scale: Fair                      Cognition Arousal/Alertness: Awake/alert Behavior During Therapy: WFL for tasks assessed/performed Overall Cognitive Status: History of cognitive impairments - at baseline (baseline dementia)       Memory: Decreased short-term memory              Exercises      General Comments        Pertinent Vitals/Pain Pain Assessment: No/denies pain    Home Living                      Prior Function            PT Goals (current goals can now be found in the care plan section) Acute Rehab PT Goals Patient Stated Goal: to return home PT Goal Formulation: With patient/family Time For Goal Achievement: 05/29/16 Potential to Achieve Goals: Good Progress towards PT goals: Progressing toward goals    Frequency    Min 3X/week      PT Plan Current plan remains appropriate    Co-evaluation             End of Session Equipment Utilized During Treatment: Gait belt Activity Tolerance: Patient tolerated treatment well Patient left: in chair;with call bell/phone within reach;with family/visitor present     Time: 5053-9767 PT Time Calculation (min) (ACUTE ONLY): 21 min  Charges:  $Gait Training: 8-22 mins  G CodesFabio Asa Jun 04, 2016, 2:40 PM Charlotte Crumb, PT DPT  [phone]

## 2016-05-24 DIAGNOSIS — Z23 Encounter for immunization: Secondary | ICD-10-CM | POA: Diagnosis not present

## 2016-05-24 LAB — BASIC METABOLIC PANEL
ANION GAP: 9 (ref 5–15)
BUN: 10 mg/dL (ref 6–20)
CO2: 26 mmol/L (ref 22–32)
Calcium: 8.8 mg/dL — ABNORMAL LOW (ref 8.9–10.3)
Chloride: 104 mmol/L (ref 101–111)
Creatinine, Ser: 0.93 mg/dL (ref 0.61–1.24)
GFR calc Af Amer: >60 mL/min (ref >60)
Glucose, Bld: 143 mg/dL — ABNORMAL HIGH (ref 65–99)
POTASSIUM: 3.3 mmol/L — AB (ref 3.5–5.1)
SODIUM: 139 mmol/L (ref 135–145)

## 2016-05-24 LAB — CBC
HCT: 36.8 % — ABNORMAL LOW (ref 39.0–52.0)
HEMOGLOBIN: 11.5 g/dL — AB (ref 13.0–17.0)
MCH: 26.4 pg (ref 26.0–34.0)
MCHC: 31.3 g/dL (ref 30.0–36.0)
MCV: 84.6 fL (ref 78.0–100.0)
Platelets: 187 10*3/uL (ref 150–400)
RBC: 4.35 MIL/uL (ref 4.22–5.81)
RDW: 16 % — ABNORMAL HIGH (ref 11.5–15.5)
WBC: 5.8 10*3/uL (ref 4.0–10.5)

## 2016-05-24 LAB — GLUCOSE, CAPILLARY
GLUCOSE-CAPILLARY: 124 mg/dL — AB (ref 65–99)
Glucose-Capillary: 130 mg/dL — ABNORMAL HIGH (ref 65–99)

## 2016-05-24 LAB — C DIFFICILE QUICK SCREEN W PCR REFLEX
C DIFFICILE (CDIFF) TOXIN: NEGATIVE
C DIFFICLE (CDIFF) ANTIGEN: NEGATIVE
C Diff interpretation: NOT DETECTED

## 2016-05-24 LAB — HEPARIN LEVEL (UNFRACTIONATED): HEPARIN UNFRACTIONATED: 0.45 [IU]/mL (ref 0.30–0.70)

## 2016-05-24 MED ORDER — RIVAROXABAN 15 MG PO TABS
15.0000 mg | ORAL_TABLET | Freq: Two times a day (BID) | ORAL | 0 refills | Status: DC
Start: 2016-05-24 — End: 2016-09-06

## 2016-05-24 MED ORDER — RIVAROXABAN 20 MG PO TABS: 20.0000 mg | ORAL_TABLET | Freq: Every day | ORAL | Status: DC

## 2016-05-24 MED ORDER — RIVAROXABAN 15 MG PO TABS
15.0000 mg | ORAL_TABLET | Freq: Two times a day (BID) | ORAL | Status: DC
  Administered 2016-05-24 (×2): 15 mg via ORAL
  Filled 2016-05-24 (×2): qty 1

## 2016-05-24 MED ORDER — RIVAROXABAN 20 MG PO TABS: 20.0000 mg | ORAL_TABLET | Freq: Every day | ORAL | 3 refills | Status: DC

## 2016-05-24 MED ORDER — POTASSIUM CHLORIDE CRYS ER 20 MEQ PO TBCR
40.0000 meq | EXTENDED_RELEASE_TABLET | Freq: Two times a day (BID) | ORAL | Status: DC
  Administered 2016-05-24: 40 meq via ORAL
  Filled 2016-05-24: qty 2

## 2016-05-24 NOTE — Care Management Note (Signed)
Case Management Note  Patient Details  Name: Andrew Winters MRN: [id number] Date of Birth: 12-19-1940  Subjective/Objective:   Pt to discharge on Xarelto, provided card for free 30-day free trial.  PT recommends home therapy and pt agrees, chose [facility] from list of agencies - referral made.                         Expected Discharge Plan:  Home w Home Health Services  Discharge planning Services  CM Consult, Medication Assistance  Post Acute Care Choice:  Home Health, Durable Medical Equipment Choice offered to:  Patient  DME Arranged:  3-N-1 DME Agency:  [facility]  HH Arranged:  PT [facility] Agency:  [facility]  Status of Service:  Completed, signed off  Magdalene River, [place] 05/24/2016, 3:49 PM

## 2016-05-24 NOTE — Progress Notes (Signed)
Family Medicine Teaching Service Daily Progress Note Intern Pager: [phone]  Patient name: Andrew Winters Medical record number: [id number] Date of birth: 17-Feb-1941 Age: 75 y.o. Gender: male  Primary Care Provider: Crist Fat, MD Consultants: None     Code Status: Full  Pt Overview and Major Events to Date:  11/10: Admit to SDU for b/l PE 11/11: Continue to monitor inpatient on Heparin 11/13: plan for cardiac echo, transition to DOAC, possible DC  Assessment and Plan: 75 y.o.malepresenting with left-sided pleuritic chest pain, found to have bilateral PE. PMH is significant for h/o DVT, DMT2, HTN, HLD, obesity, depression, cognitive impairment.  Bilateral Pulmonary Embolism w/ Pleuritic Chest Pain: Tachypnic at 24, satting well on room air.  Patient on heparin gtt per pharmacy for PE.  Pain controlled on home regimen.  - Continue to monitor in SDU - Vital signs per floor protocol - Echo 05/23/2016: EF 55-60%, G1DD, RV mildly dilated, RVSP normal - Will transition to NOAC (Rivaroxaban) likely lifelong therapy - Scheduled Tylenol and Tramadol PRN pain - PT/OT (fall precautions)  Diarrhea, new - patient with 3 loose stools overnight. Cdiff ordered. - follow up cdiff - some history of ?abdominal surgery planned prior to this admission for a hernia, will investigate further  Diabetes Mellitus Type 2 / Peripheral Neuropathy, Chronic, Stable:Takes Amaryl and metformin as home. Last A1c 7.0 on 01/2015. CBG 119-144 o/n. - Hold metformin and Amaryl - ModerateSSI - Monitor CBGs    Mild Cognitive Impairment, Chronic, Stable:Wife says patient is loosing his memory and is hard of hearing. - Continue home Donepezil 10 mg  Mild thrombocytopenia, resolved: Trend 153>>>>180  - Monitor closely given Heparin drip  FEN/GI: Diabetic diet Prophylaxis: Heparin ggt per pharmacy  Disposition:  Home with Home heatlh 24 hr supervision/assistance recommended   Subjective:  Patient  c/o diarrhea overnight with griping abdominal pain. Otherwise no chest pain, no dyspnea.   No other complaints.  Objective: Temp:  [97.9 F (36.6 C)-98.4 F (36.9 C)] 98.2 F (36.8 C) (11/14 0900) Pulse Rate:  [66-74] 74 (11/14 0357) Resp:  [17-26] 24 (11/14 0357) BP: (92-127)/(66-74) 99/74 (11/14 0357) SpO2:  [94 %-96 %] 95 % (11/14 0357) Physical Exam: General: NAD, rests comfortably in bed, no apparent distress Cardiovascular: RRR, no m/r/g Respiratory: CTA bil, no W/R/R Abdomen: notes diffuse tenderness but no tenderness to palpation , no rigidity rebound or guarding, normoactive BS, no HSM Extremities: warm and well perfused without edema  Laboratory:  Recent Labs Lab 05/22/16 0245 05/23/16 0219 05/24/16 0226  WBC 5.1 5.7 5.8  HGB 11.5* 11.9* 11.5*  HCT 36.5* 37.6* 36.8*  PLT 146* 175 187    Recent Labs Lab 05/21/16 0425 05/23/16 0219 05/24/16 0226  NA 139 141 139  K 3.7 3.7 3.3*  CL 103 106 104  CO2 27 27 26   BUN 9 8 10   CREATININE 1.04 1.02 0.93  CALCIUM 8.7* 9.0 8.8*  GLUCOSE 131* 123* 143*      Imaging/Diagnostic Tests: CT ANGIOGRAPHY CHEST WITH CONTRAST (05/20/2016) IMPRESSION: 1. Bilateral pulmonary emboli which are partially occlusive with evidence of right heart strain. Positive for acute PE with CT evidence of right heart strain (RV/LV Ratio = ) consistent with at least submassive (intermediate risk) PE. The presence of right heart strain has been associated with an increased risk of morbidity and mortality. Please activate Code PE by paging [phone]. 2. Stable nodule or pleural thickening deep in the lung apices with some calcification most likely chronic in nature. 3. Question  gastroesophageal reflux with some fluid in the esophagus. 4. Critical Value/emergent results were called by telephone at the time of interpretation on 05/20/2016 at 2:57 pm to Dr. Benjiman Core , who verbally acknowledged these results.  Howard Pouch,  MD 05/24/2016, 9:04 AM PGY-1, [facility] Family Medicine FPTS Intern pager: [phone], text pages welcome

## 2016-05-24 NOTE — Progress Notes (Signed)
ANTICOAGULATION CONSULT NOTE - Follow Up Consult  Pharmacy Consult for Heparin  Indication: pulmonary embolus  Allergies  Allergen Reactions  . Aspirin Diarrhea   Patient Measurements: Height: 6\' 1"  (185.4 cm) Weight: 269 lb 6.4 oz (122.2 kg) IBW/kg (Calculated) : 79.9  Vital Signs: Temp: 98.1 F (36.7 C) (11/14 0357) Temp Source: Oral (11/14 0357) BP: 99/74 (11/14 0357) Pulse Rate: 74 (11/14 0357)   Assessment: 75 y/o M with chest pressure found to have new onset bilateral pulmonary emboli. Pharmacy consulted for heparin gtt. Last HL remains therapeutic at 0.45. Hgb stable at 11.5, plts wnl. No s/s of bleed.   Goal of Therapy:  Heparin level 0.3-0.7 units/ml Monitor platelets by anticoagulation protocol: Yes   Plan:  Continue heparin gtt at 1,300 units/hr Monitor daily HL, CBC, s/s of bleed F/U transition to NOAC after ECHO  61, PharmD, BCPS Clinical Pharmacist Pager [phone] 05/24/2016 7:23 AM

## 2016-05-24 NOTE — Progress Notes (Signed)
Transitions of Care Pharmacy Note  Plan:  -Educated on Xarelto, as well as reviewing other unchanged home medications  -Addressed concerns regarding starting a new medication (Xarelto) -Addressed concerns regarding missing Atorvastatin prescription at home -Suggested sitting down with medication list and medication bottles at home to make sure discharge list matches the medications patient has at home.  -Recommend patient to follow-up with PCP if unable to find Atorvastatin to request a new prescription.  --------------------------------------------- Andrew Winters is an 75 y.o. male who presents with a chief complaint chest pain related to PE. In anticipation of discharge, pharmacy has reviewed this patient's prior to admission medication history, as well as current inpatient medications listed per the [facility].  Current medication indications, dosing, frequency, and notable side effects reviewed with patient and family. Patient and family verbalized understanding of current inpatient medication regimen and is aware that the After Visit Summary when presented, will represent the most accurate medication list at discharge.   Andrew Winters expressed concerns regarding not being able to get new medications until tomorrow morning. I reassured the patient and his wife, that getting the Xarelto filled in the morning is okay, as long as they take the morning dose with food as soon as they fill the prescription. The patient's wife also expressed concern regarding a potentially missing bottle of the patient's atorvastatin. I encouraged the patient's wife to call their pharmacy and/or PCP if she was unable to locate the bottle when she gets home. I recommended she request a refill or short supply while waiting for next fill.   Assessment: Understanding of regimen: good Understanding of indications: good Potential of compliance: good Barriers to Obtaining Medications: No  Patient instructed to contact  inpatient pharmacy team with further questions or concerns if needed.    Time spent preparing for discharge counseling: 15 min Time spent counseling patient: 20 min    Thank you for allowing pharmacy to be a part of this patient's care.  York Cerise, PharmD Pharmacy Resident  Pager [phone] 05/24/16 5:19 PM

## 2016-05-24 NOTE — Discharge Instructions (Signed)
Information on my medicine - XARELTO (rivaroxaban)  This medication education was reviewed with me or my healthcare representative as part of my discharge preparation.   WHY WAS XARELTO PRESCRIBED FOR YOU? Xarelto was prescribed to treat blood clots that may have been found in the veins of your legs (deep vein thrombosis) or in your lungs (pulmonary embolism) and to reduce the risk of them occurring again.  What do you need to know about Xarelto? The starting dose is one 15 mg tablet taken TWICE daily with food for the FIRST 21 DAYS then on 12/5 the dose is changed to one 20 mg tablet taken ONCE A DAY with your evening meal.  DO NOT stop taking Xarelto without talking to the health care provider who prescribed the medication.  Refill your prescription for 20 mg tablets before you run out.  After discharge, you should have regular check-up appointments with your healthcare provider that is prescribing your Xarelto.  In the future your dose may need to be changed if your kidney function changes by a significant amount.  What do you do if you miss a dose? If you are taking Xarelto TWICE DAILY and you miss a dose, take it as soon as you remember. You may take two 15 mg tablets (total 30 mg) at the same time then resume your regularly scheduled 15 mg twice daily the next day.  If you are taking Xarelto ONCE DAILY and you miss a dose, take it as soon as you remember on the same day then continue your regularly scheduled once daily regimen the next day. Do not take two doses of Xarelto at the same time.   Important Safety Information Xarelto is a blood thinner medicine that can cause bleeding. You should call your healthcare provider right away if you experience any of the following: ? Bleeding from an injury or your nose that does not stop. ? Unusual colored urine (red or dark brown) or unusual colored stools (red or black). ? Unusual bruising for unknown reasons. ? A serious fall or if  you hit your head (even if there is no bleeding).  Some medicines may interact with Xarelto and might increase your risk of bleeding while on Xarelto. To help avoid this, consult your healthcare provider or pharmacist prior to using any new prescription or non-prescription medications, including herbals, vitamins, non-steroidal anti-inflammatory drugs (NSAIDs) and supplements.  This website has more information on Xarelto: VisitDestination.com.br.

## 2016-05-26 LAB — GLUCOSE, CAPILLARY: Glucose-Capillary: 142 mg/dL — ABNORMAL HIGH (ref 65–99)

## 2016-05-27 DIAGNOSIS — M25572 Pain in left ankle and joints of left foot: Secondary | ICD-10-CM | POA: Diagnosis not present

## 2016-05-27 DIAGNOSIS — I2692 Saddle embolus of pulmonary artery without acute cor pulmonale: Secondary | ICD-10-CM | POA: Diagnosis not present

## 2016-05-27 DIAGNOSIS — I2699 Other pulmonary embolism without acute cor pulmonale: Secondary | ICD-10-CM | POA: Diagnosis not present

## 2016-05-27 DIAGNOSIS — I10 Essential (primary) hypertension: Secondary | ICD-10-CM | POA: Diagnosis not present

## 2016-05-27 DIAGNOSIS — Z7901 Long term (current) use of anticoagulants: Secondary | ICD-10-CM | POA: Diagnosis not present

## 2016-05-27 DIAGNOSIS — Z7984 Long term (current) use of oral hypoglycemic drugs: Secondary | ICD-10-CM | POA: Diagnosis not present

## 2016-05-27 DIAGNOSIS — M11261 Other chondrocalcinosis, right knee: Secondary | ICD-10-CM | POA: Diagnosis not present

## 2016-05-27 DIAGNOSIS — Z86718 Personal history of other venous thrombosis and embolism: Secondary | ICD-10-CM | POA: Diagnosis not present

## 2016-05-27 DIAGNOSIS — E1140 Type 2 diabetes mellitus with diabetic neuropathy, unspecified: Secondary | ICD-10-CM | POA: Diagnosis not present

## 2016-05-27 DIAGNOSIS — M1711 Unilateral primary osteoarthritis, right knee: Secondary | ICD-10-CM | POA: Diagnosis not present

## 2016-05-31 DIAGNOSIS — M1711 Unilateral primary osteoarthritis, right knee: Secondary | ICD-10-CM | POA: Diagnosis not present

## 2016-05-31 DIAGNOSIS — I10 Essential (primary) hypertension: Secondary | ICD-10-CM | POA: Diagnosis not present

## 2016-05-31 DIAGNOSIS — Z7984 Long term (current) use of oral hypoglycemic drugs: Secondary | ICD-10-CM | POA: Diagnosis not present

## 2016-05-31 DIAGNOSIS — E1140 Type 2 diabetes mellitus with diabetic neuropathy, unspecified: Secondary | ICD-10-CM | POA: Diagnosis not present

## 2016-05-31 DIAGNOSIS — M11261 Other chondrocalcinosis, right knee: Secondary | ICD-10-CM | POA: Diagnosis not present

## 2016-05-31 DIAGNOSIS — M25572 Pain in left ankle and joints of left foot: Secondary | ICD-10-CM | POA: Diagnosis not present

## 2016-05-31 DIAGNOSIS — Z7901 Long term (current) use of anticoagulants: Secondary | ICD-10-CM | POA: Diagnosis not present

## 2016-05-31 DIAGNOSIS — Z86718 Personal history of other venous thrombosis and embolism: Secondary | ICD-10-CM | POA: Diagnosis not present

## 2016-05-31 DIAGNOSIS — I2692 Saddle embolus of pulmonary artery without acute cor pulmonale: Secondary | ICD-10-CM | POA: Diagnosis not present

## 2016-06-07 DIAGNOSIS — Z86718 Personal history of other venous thrombosis and embolism: Secondary | ICD-10-CM | POA: Diagnosis not present

## 2016-06-07 DIAGNOSIS — M1711 Unilateral primary osteoarthritis, right knee: Secondary | ICD-10-CM | POA: Diagnosis not present

## 2016-06-07 DIAGNOSIS — E1140 Type 2 diabetes mellitus with diabetic neuropathy, unspecified: Secondary | ICD-10-CM | POA: Diagnosis not present

## 2016-06-07 DIAGNOSIS — I2692 Saddle embolus of pulmonary artery without acute cor pulmonale: Secondary | ICD-10-CM | POA: Diagnosis not present

## 2016-06-07 DIAGNOSIS — M11261 Other chondrocalcinosis, right knee: Secondary | ICD-10-CM | POA: Diagnosis not present

## 2016-06-07 DIAGNOSIS — I10 Essential (primary) hypertension: Secondary | ICD-10-CM | POA: Diagnosis not present

## 2016-06-07 DIAGNOSIS — Z7984 Long term (current) use of oral hypoglycemic drugs: Secondary | ICD-10-CM | POA: Diagnosis not present

## 2016-06-07 DIAGNOSIS — Z7901 Long term (current) use of anticoagulants: Secondary | ICD-10-CM | POA: Diagnosis not present

## 2016-06-07 DIAGNOSIS — M25572 Pain in left ankle and joints of left foot: Secondary | ICD-10-CM | POA: Diagnosis not present

## 2016-06-09 DIAGNOSIS — E1140 Type 2 diabetes mellitus with diabetic neuropathy, unspecified: Secondary | ICD-10-CM | POA: Diagnosis not present

## 2016-06-09 DIAGNOSIS — Z7901 Long term (current) use of anticoagulants: Secondary | ICD-10-CM | POA: Diagnosis not present

## 2016-06-09 DIAGNOSIS — Z86718 Personal history of other venous thrombosis and embolism: Secondary | ICD-10-CM | POA: Diagnosis not present

## 2016-06-09 DIAGNOSIS — Z7984 Long term (current) use of oral hypoglycemic drugs: Secondary | ICD-10-CM | POA: Diagnosis not present

## 2016-06-09 DIAGNOSIS — I2692 Saddle embolus of pulmonary artery without acute cor pulmonale: Secondary | ICD-10-CM | POA: Diagnosis not present

## 2016-06-09 DIAGNOSIS — I10 Essential (primary) hypertension: Secondary | ICD-10-CM | POA: Diagnosis not present

## 2016-06-09 DIAGNOSIS — M25572 Pain in left ankle and joints of left foot: Secondary | ICD-10-CM | POA: Diagnosis not present

## 2016-06-09 DIAGNOSIS — M1711 Unilateral primary osteoarthritis, right knee: Secondary | ICD-10-CM | POA: Diagnosis not present

## 2016-06-09 DIAGNOSIS — M11261 Other chondrocalcinosis, right knee: Secondary | ICD-10-CM | POA: Diagnosis not present

## 2016-06-10 ENCOUNTER — Other Ambulatory Visit: Payer: Self-pay | Admitting: Internal Medicine

## 2016-06-10 DIAGNOSIS — I872 Venous insufficiency (chronic) (peripheral): Secondary | ICD-10-CM | POA: Diagnosis not present

## 2016-06-15 DIAGNOSIS — Z7984 Long term (current) use of oral hypoglycemic drugs: Secondary | ICD-10-CM | POA: Diagnosis not present

## 2016-06-15 DIAGNOSIS — M25572 Pain in left ankle and joints of left foot: Secondary | ICD-10-CM | POA: Diagnosis not present

## 2016-06-15 DIAGNOSIS — M1711 Unilateral primary osteoarthritis, right knee: Secondary | ICD-10-CM | POA: Diagnosis not present

## 2016-06-15 DIAGNOSIS — I10 Essential (primary) hypertension: Secondary | ICD-10-CM | POA: Diagnosis not present

## 2016-06-15 DIAGNOSIS — Z86718 Personal history of other venous thrombosis and embolism: Secondary | ICD-10-CM | POA: Diagnosis not present

## 2016-06-15 DIAGNOSIS — E1140 Type 2 diabetes mellitus with diabetic neuropathy, unspecified: Secondary | ICD-10-CM | POA: Diagnosis not present

## 2016-06-15 DIAGNOSIS — I2692 Saddle embolus of pulmonary artery without acute cor pulmonale: Secondary | ICD-10-CM | POA: Diagnosis not present

## 2016-06-15 DIAGNOSIS — Z7901 Long term (current) use of anticoagulants: Secondary | ICD-10-CM | POA: Diagnosis not present

## 2016-06-15 DIAGNOSIS — M11261 Other chondrocalcinosis, right knee: Secondary | ICD-10-CM | POA: Diagnosis not present

## 2016-06-17 DIAGNOSIS — Z86718 Personal history of other venous thrombosis and embolism: Secondary | ICD-10-CM | POA: Diagnosis not present

## 2016-06-17 DIAGNOSIS — M25572 Pain in left ankle and joints of left foot: Secondary | ICD-10-CM | POA: Diagnosis not present

## 2016-06-17 DIAGNOSIS — M1711 Unilateral primary osteoarthritis, right knee: Secondary | ICD-10-CM | POA: Diagnosis not present

## 2016-06-17 DIAGNOSIS — E1140 Type 2 diabetes mellitus with diabetic neuropathy, unspecified: Secondary | ICD-10-CM | POA: Diagnosis not present

## 2016-06-17 DIAGNOSIS — M11261 Other chondrocalcinosis, right knee: Secondary | ICD-10-CM | POA: Diagnosis not present

## 2016-06-17 DIAGNOSIS — Z7984 Long term (current) use of oral hypoglycemic drugs: Secondary | ICD-10-CM | POA: Diagnosis not present

## 2016-06-17 DIAGNOSIS — I2692 Saddle embolus of pulmonary artery without acute cor pulmonale: Secondary | ICD-10-CM | POA: Diagnosis not present

## 2016-06-17 DIAGNOSIS — Z7901 Long term (current) use of anticoagulants: Secondary | ICD-10-CM | POA: Diagnosis not present

## 2016-06-17 DIAGNOSIS — I10 Essential (primary) hypertension: Secondary | ICD-10-CM | POA: Diagnosis not present

## 2016-06-20 DIAGNOSIS — E1140 Type 2 diabetes mellitus with diabetic neuropathy, unspecified: Secondary | ICD-10-CM | POA: Diagnosis not present

## 2016-06-20 DIAGNOSIS — I2692 Saddle embolus of pulmonary artery without acute cor pulmonale: Secondary | ICD-10-CM | POA: Diagnosis not present

## 2016-06-20 DIAGNOSIS — Z86718 Personal history of other venous thrombosis and embolism: Secondary | ICD-10-CM | POA: Diagnosis not present

## 2016-06-20 DIAGNOSIS — Z7984 Long term (current) use of oral hypoglycemic drugs: Secondary | ICD-10-CM | POA: Diagnosis not present

## 2016-06-20 DIAGNOSIS — M25572 Pain in left ankle and joints of left foot: Secondary | ICD-10-CM | POA: Diagnosis not present

## 2016-06-20 DIAGNOSIS — M11261 Other chondrocalcinosis, right knee: Secondary | ICD-10-CM | POA: Diagnosis not present

## 2016-06-20 DIAGNOSIS — I10 Essential (primary) hypertension: Secondary | ICD-10-CM | POA: Diagnosis not present

## 2016-06-20 DIAGNOSIS — M1711 Unilateral primary osteoarthritis, right knee: Secondary | ICD-10-CM | POA: Diagnosis not present

## 2016-06-20 DIAGNOSIS — Z7901 Long term (current) use of anticoagulants: Secondary | ICD-10-CM | POA: Diagnosis not present

## 2016-06-23 DIAGNOSIS — M1711 Unilateral primary osteoarthritis, right knee: Secondary | ICD-10-CM | POA: Diagnosis not present

## 2016-06-23 DIAGNOSIS — I10 Essential (primary) hypertension: Secondary | ICD-10-CM | POA: Diagnosis not present

## 2016-06-23 DIAGNOSIS — M11261 Other chondrocalcinosis, right knee: Secondary | ICD-10-CM | POA: Diagnosis not present

## 2016-06-23 DIAGNOSIS — I2692 Saddle embolus of pulmonary artery without acute cor pulmonale: Secondary | ICD-10-CM | POA: Diagnosis not present

## 2016-06-23 DIAGNOSIS — E1140 Type 2 diabetes mellitus with diabetic neuropathy, unspecified: Secondary | ICD-10-CM | POA: Diagnosis not present

## 2016-06-23 DIAGNOSIS — M25572 Pain in left ankle and joints of left foot: Secondary | ICD-10-CM | POA: Diagnosis not present

## 2016-06-23 DIAGNOSIS — Z86718 Personal history of other venous thrombosis and embolism: Secondary | ICD-10-CM | POA: Diagnosis not present

## 2016-06-23 DIAGNOSIS — Z7984 Long term (current) use of oral hypoglycemic drugs: Secondary | ICD-10-CM | POA: Diagnosis not present

## 2016-06-23 DIAGNOSIS — Z7901 Long term (current) use of anticoagulants: Secondary | ICD-10-CM | POA: Diagnosis not present

## 2016-06-28 ENCOUNTER — Telehealth: Payer: Self-pay | Admitting: Gastroenterology

## 2016-06-28 NOTE — Telephone Encounter (Signed)
Dr. Russella Dar reviewed records. Ok to schedule Direct Colon with Dr. Adela Lank, Dr. Myrtie Neither, or Dr. Lavon Paganini. Former Dr. Juanda Chance patient. Left message for patient to return my call.

## 2016-06-28 NOTE — Telephone Encounter (Signed)
DR. Myrtie Neither will you accept this patient?  Thanks [employer]

## 2016-06-30 ENCOUNTER — Encounter: Payer: Self-pay | Admitting: Gastroenterology

## 2016-06-30 NOTE — Telephone Encounter (Signed)
Dr. Danis reviewed records and has accepted patient. Ok to schedule OV. Appointment scheduled. °

## 2016-07-01 DIAGNOSIS — Z5181 Encounter for therapeutic drug level monitoring: Secondary | ICD-10-CM | POA: Diagnosis not present

## 2016-07-01 DIAGNOSIS — R1033 Periumbilical pain: Secondary | ICD-10-CM | POA: Diagnosis not present

## 2016-07-01 DIAGNOSIS — E1140 Type 2 diabetes mellitus with diabetic neuropathy, unspecified: Secondary | ICD-10-CM | POA: Diagnosis not present

## 2016-07-01 DIAGNOSIS — I10 Essential (primary) hypertension: Secondary | ICD-10-CM | POA: Diagnosis not present

## 2016-07-08 DIAGNOSIS — E1140 Type 2 diabetes mellitus with diabetic neuropathy, unspecified: Secondary | ICD-10-CM | POA: Diagnosis not present

## 2016-07-08 DIAGNOSIS — R1013 Epigastric pain: Secondary | ICD-10-CM | POA: Diagnosis not present

## 2016-07-08 DIAGNOSIS — R1033 Periumbilical pain: Secondary | ICD-10-CM | POA: Diagnosis not present

## 2016-07-08 DIAGNOSIS — N400 Enlarged prostate without lower urinary tract symptoms: Secondary | ICD-10-CM | POA: Diagnosis not present

## 2016-07-08 DIAGNOSIS — K429 Umbilical hernia without obstruction or gangrene: Secondary | ICD-10-CM | POA: Diagnosis not present

## 2016-07-11 HISTORY — PX: CATARACT EXTRACTION, BILATERAL: SHX1313

## 2016-07-20 ENCOUNTER — Ambulatory Visit (INDEPENDENT_AMBULATORY_CARE_PROVIDER_SITE_OTHER): Payer: Medicare Other | Admitting: Physician Assistant

## 2016-07-20 ENCOUNTER — Encounter: Payer: Self-pay | Admitting: Physician Assistant

## 2016-07-20 VITALS — BP 110/66 | HR 88 | Ht 73.0 in | Wt 268.4 lb

## 2016-07-20 DIAGNOSIS — R1310 Dysphagia, unspecified: Secondary | ICD-10-CM

## 2016-07-20 DIAGNOSIS — Z7901 Long term (current) use of anticoagulants: Secondary | ICD-10-CM | POA: Diagnosis not present

## 2016-07-20 DIAGNOSIS — R634 Abnormal weight loss: Secondary | ICD-10-CM | POA: Diagnosis not present

## 2016-07-20 DIAGNOSIS — R1033 Periumbilical pain: Secondary | ICD-10-CM

## 2016-07-20 MED ORDER — OMEPRAZOLE 40 MG PO CPDR: 40.0000 mg | DELAYED_RELEASE_CAPSULE | Freq: Two times a day (BID) | ORAL | 5 refills | Status: DC

## 2016-07-20 NOTE — Patient Instructions (Signed)
We have sent the following medications to your pharmacy for you to pick up at your convenience: Omeprazole 40 mg twice a day.  Stop Nexium.  We have given you a handout on a diet.  You have been scheduled for a Barium Esophogram at [facility] (1st floor of the hospital) on 07/28/16 at 11 am. Please arrive 15 minutes prior to your appointment for registration. Make certain not to have anything to eat or drink 6 hours prior to your test. If you need to reschedule for any reason, please contact radiology at [phone] to do so. __________________________________________________________________ A barium swallow is an examination that concentrates on views of the esophagus. This tends to be a double contrast exam (barium and two liquids which, when combined, create a gas to distend the wall of the oesophagus) or single contrast (non-ionic iodine based). The study is usually tailored to your symptoms so a good history is essential. Attention is paid during the study to the form, structure and configuration of the esophagus, looking for functional disorders (such as aspiration, dysphagia, achalasia, motility and reflux) EXAMINATION You may be asked to change into a gown, depending on the type of swallow being performed. A radiologist and radiographer will perform the procedure. The radiologist will advise you of the type of contrast selected for your procedure and direct you during the exam. You will be asked to stand, sit or lie in several different positions and to hold a small amount of fluid in your mouth before being asked to swallow while the imaging is performed .In some instances you may be asked to swallow barium coated marshmallows to assess the motility of a solid food bolus. The exam can be recorded as a digital or video fluoroscopy procedure. POST PROCEDURE It will take 1-2 days for the barium to pass through your system. To facilitate this, it is important, unless otherwise  directed, to increase your fluids for the next 24-48hrs and to resume your normal diet.  This test typically takes about 30 minutes to perform. __________________________________________________________________________________

## 2016-07-20 NOTE — Progress Notes (Addendum)
Chief Complaint: Abdominal Pain, Dysphagia and Weight Loss  HPI:  Andrew Winters is a 76 year old Caucasian male with a past medical history of acute saddle pulmonary embolism (echo 05/23/16 shows no LVEF 55-60%), anxiety, chronic diarrhea, depression, diabetes, GERD, hypertension and rheumatoid arthritis, as well as other history listed below, who presents to clinic today as a new patient, for a complaint of dysphagia and abdominal pain as well as weight loss . Patient was previously followed by Dr. Juanda Chance and has been accepted by Dr. Myrtie Neither as a new patient.   Patient's last EGD with dilation was on 01/19/11 and there was retained food at time of exam as well as presbyesophagus and otherwise normal exam. There was no definite stricture and question of a muscular ring versus spasm versus other, there was passage of an 18 and 19 mm dilator. Patient was instructed to maintain a gastroparesis diet and was continued on Prilosec 20 mg daily. It appears patient then had barium swallow on 06/14/11 which showed mild esophageal dysmotility, mild esophageal reflux and no mucosal irregularity.   Per chart review patient had recent bilateral pulmonary embolism 05/20/16 he was started on Xarelto 20 mg once daily starting December 6. It was felt that therapy would likely be lifelong. Per discharge summary patient apparently complained of abdominal pain and diarrhea which appeared to be chronic. C. difficile was negative at that time.     Today, the patient presents to clinic accompanied by his wife who does assist with his history. They explain that the patient has been diagnosed with an umbilical hernia and this has been giving him some pain for some time. He was referred to a surgeon regarding this but in the interim around November had a blood clot, as above, and surgical consult was postponed. The patient does tell me he continues with abdominal pain in this area only, which is worse when he is sitting as this applies  pressure to this area. He denies constant discomfort, fever or chills.   Patient also tells me today that he continues with symptoms of dysphagia. This has been going on for a "long time". He notes that now he cannot eat solid food at all and sometimes even regurgitates water after drinking it. He tells me he does not feel like food makes it to his stomach. He denies any epigastric pain. Associated symptoms include breakthrough reflux regardless of Nexium 40 mg daily. Patient tells me at least 2-3 days out of the week he will have reflux. He tells me he thinks this was better controlled on Omeprazole 20 mg twice a day.   Patient has also had a total of about a 40 pound weight loss in the past to 3 months. Per his wife his primary care physician was concerned regarding possible cancer and wanted a colonoscopy.   Patient denies fever, chills, blood in the stool, melena, change in bowel habits, anorexia, nausea or symptoms that awaken him at night.  Past Medical History:  Diagnosis Date  . Acute saddle pulmonary embolism (HCC) 05/20/2016  . Anxiety   . Bilateral swelling of feet   . BPH (benign prostatic hyperplasia)    no meds needed per medical md  . Cataracts, bilateral    immature  . Chronic diarrhea   . Deafness    in left ear   . Depression    takes Zoloft daily  . Diabetes mellitus    takes Actos and Amaryl daily as well as Onglyza  . Dysphagia   .  Fatigue   . GERD (gastroesophageal reflux disease)    takes Omeprazole daily  . Headaches, cluster   . Hiatal hernia   . Hyperlipemia    was on meds but hasnt been on anything in about 57months per MD  . Hypertension    takes Amlodipine,HCTZ,and Lisinopril daily  . Hyperthyroidism   . Insomnia    takes Trazodone nightly  . Joint pain   . Joint swelling   . Muscle pain   . Nocturia   . Osteoarthritis   . Peripheral neuropathy (HCC)    takes Gabapentin daily  . Pneumonia 3+ yrs ago  . Presbyesophagus   . Rheumatoid  arthritis(714.0)   . Vitamin D deficiency    takes Vit D daily    Past Surgical History:  Procedure Laterality Date  . BACK SURGERY  1998  . COLONOSCOPY WITH ESOPHAGOGASTRODUODENOSCOPY (EGD) AND ESOPHAGEAL DILATION (ED)    . IRRIGATION AND DEBRIDEMENT KNEE Right 01/12/2015  . TOTAL KNEE ARTHROPLASTY Right 01/06/2015   Procedure: TOTAL KNEE ARTHROPLASTY;  Surgeon: Marcene Corning, MD;  Location: [facility] OR;  Service: Orthopedics;  Laterality: Right;    Current Outpatient Prescriptions  Medication Sig Dispense Refill  . acetaminophen (TYLENOL) 500 MG tablet Take 1,000 mg by mouth every 6 (six) hours as needed for mild pain, moderate pain or headache.     Marland Kitchen amLODipine (NORVASC) 5 MG tablet Take 5 mg by mouth daily.    Marland Kitchen atorvastatin (LIPITOR) 40 MG tablet Take 40 mg by mouth at bedtime.     . cetirizine (ZYRTEC) 10 MG tablet Take 10 mg by mouth daily.    . cholecalciferol (VITAMIN D) 1000 units tablet Take 1,000 Units by mouth 2 (two) times daily.    Marland Kitchen docusate sodium (COLACE) 100 MG capsule Take 100 mg by mouth 2 (two) times daily as needed for mild constipation.    . ferrous sulfate 325 (65 FE) MG tablet Take 325 mg by mouth daily with breakfast.    . fluticasone (FLONASE) 50 MCG/ACT nasal spray Place 2 sprays into both nostrils daily as needed for rhinitis.     . furosemide (LASIX) 20 MG tablet Take 20 mg by mouth daily.    Marland Kitchen gabapentin (NEURONTIN) 100 MG capsule Take 100 mg by mouth 3 (three) times daily.      Marland Kitchen glimepiride (AMARYL) 4 MG tablet Take 4 mg by mouth 2 (two) times daily.     Marland Kitchen lisinopril (PRINIVIL,ZESTRIL) 10 MG tablet Take 10 mg by mouth daily.     . metFORMIN (GLUCOPHAGE-XR) 500 MG 24 hr tablet Take 500 mg by mouth daily.    Marland Kitchen omeprazole (PRILOSEC OTC) 20 MG tablet Take 20 mg by mouth 2 (two) times daily.    . potassium chloride (K-DUR,KLOR-CON) 10 MEQ tablet Take 10 mEq by mouth daily.    . ranitidine (ZANTAC) 150 MG tablet Take 150 mg by mouth at bedtime.    . rivaroxaban  (XARELTO) 20 MG TABS tablet Take 1 tablet (20 mg total) by mouth daily with supper. Take with food starting 06/15/2016 30 tablet 3  . sertraline (ZOLOFT) 25 MG tablet Take 25 mg by mouth daily.     . traMADol (ULTRAM) 50 MG tablet Take 50 mg by mouth every 8 (eight) hours as needed for moderate pain.    . traZODone (DESYREL) 50 MG tablet Take 50 mg by mouth at bedtime.    . vitamin E 400 UNIT capsule Take 400 Units by mouth daily.    Marland Kitchen  Rivaroxaban (XARELTO) 15 MG TABS tablet Take 1 tablet (15 mg total) by mouth 2 (two) times daily with a meal. 42 tablet 0   No current facility-administered medications for this visit.     Allergies as of 07/20/2016 - Review Complete 07/20/2016  Allergen Reaction Noted  . Aspirin Diarrhea 12/30/2010    Family History  Problem Relation Age of Onset  . Heart disease Mother   . Diabetes Mother   . Heart attack Father   . Diabetes Brother   . Colon cancer Neg Hx   . Stomach cancer Neg Hx   . Esophageal cancer Neg Hx   . Rectal cancer Neg Hx   . Liver cancer Neg Hx     Social History   Social History  . Marital status: Married    Spouse name: N/A  . Number of children: 3  . Years of education: N/A   Occupational History  . Retired     Social History Main Topics  . Smoking status: Former Smoker    Packs/day: 1.00    Years: 35.00    Types: Cigarettes    Quit date: 10/15/2014  . Smokeless tobacco: Never Used     Comment: pack last about 2-3wks  . Alcohol use No  . Drug use: No  . Sexual activity: Not Currently   Other Topics Concern  . Not on file   Social History Narrative  . No narrative on file    Review of Systems:    Constitutional: Per chart review a 40 pound weight loss since March of last year No fever or chills Skin: No rash or itching Cardiovascular: No chest pain Respiratory: No SOB Gastrointestinal: See HPI and otherwise negative Genitourinary: Positive for excessive urination  Neurological: Positive for headaches    Musculoskeletal: Positive for chronic back pain and arthritis  Hematologic: No bleeding  Psychiatric: Positive for anxiety and depression    Physical Exam:  Vital signs: BP 110/66   Pulse 88   Ht 6\' 1"  (1.854 m)   Wt 268 lb 6 oz (121.7 kg)   BMI 35.41 kg/m    Constitutional:   Pleasant Obese African-American male appears to be in NAD, Well developed, Well nourished, alert and cooperative Head:  Normocephalic and atraumatic. Eyes:   PEERL, EOMI. No icterus. Conjunctiva pink. Ears:  Normal auditory acuity. Neck:  Supple Throat: Oral cavity and pharynx without inflammation, swelling or lesion.  Respiratory: Respirations even and unlabored. Lungs clear to auscultation bilaterally.   No wheezes, crackles, or rhonchi.  Cardiovascular: Normal S1, S2. No MRG. Regular rate and rhythm. No peripheral edema, cyanosis or pallor.  Gastrointestinal:  Soft, nondistended, tenderness superior to the umbilicus an area of hernia to only light palpation No rebound or guarding. Normal bowel sounds. No appreciable masses or hepatomegaly. Rectal:  Not performed.  Msk:  Symmetrical without gross deformities. Without edema, no deformity or joint abnormality. Bili to change  Neurologic:  Alert and  oriented x4;  grossly normal neurologically.  Skin:   Dry and intact without significant lesions or rashes. Psychiatric: Demonstrates good judgement and reason without abnormal affect or behaviors.  RELEVANT LABS AND IMAGING: CBC    Component Value Date/Time   WBC 5.8 05/24/2016 0226   RBC 4.35 05/24/2016 0226   HGB 11.5 (L) 05/24/2016 0226   HCT 36.8 (L) 05/24/2016 0226   PLT 187 05/24/2016 0226   MCV 84.6 05/24/2016 0226   MCV 87.4 10/08/2015 1925   MCH 26.4 05/24/2016 0226   MCHC  31.3 05/24/2016 0226   RDW 16.0 (H) 05/24/2016 0226   LYMPHSABS 1.0 01/10/2015 0433   MONOABS 0.8 01/10/2015 0433   EOSABS 0.2 01/10/2015 0433   BASOSABS 0.0 01/10/2015 0433    CMP     Component Value Date/Time   NA  139 05/24/2016 0226   K 3.3 (L) 05/24/2016 0226   CL 104 05/24/2016 0226   CO2 26 05/24/2016 0226   GLUCOSE 143 (H) 05/24/2016 0226   BUN 10 05/24/2016 0226   CREATININE 0.93 05/24/2016 0226   CALCIUM 8.8 (L) 05/24/2016 0226   PROT 7.5 03/20/2016 1933   ALBUMIN 4.0 03/20/2016 1933   AST 23 03/20/2016 1933   ALT 18 03/20/2016 1933   ALKPHOS 84 03/20/2016 1933   BILITOT 0.7 03/20/2016 1933   GFRNONAA >60 05/24/2016 0226   GFRAA >60 05/24/2016 0226   CT ABDOMEN AND PELVIS WITH CONTRAST 03/20/16  TECHNIQUE: Multidetector CT imaging of the abdomen and pelvis was performed using the standard protocol following bolus administration of intravenous contrast.  CONTRAST:  ISOVUE-300 IOPAMIDOL (ISOVUE-300) INJECTION 61%  COMPARISON:  None.  FINDINGS: Lower chest: No significant abnormality  Hepatobiliary: There are normal appearances of the liver, gallbladder and bile ducts.  Pancreas: Normal  Spleen: Normal  Adrenals/Urinary Tract: The adrenals and kidneys are normal in appearance. There is no urinary calculus evident. There is no hydronephrosis or ureteral dilatation. Collecting systems and ureters appear unremarkable.  Stomach/Bowel: There are normal appearances of the stomach, small bowel and colon. The appendix is normal.  Vascular/Lymphatic: The abdominal aorta is normal in caliber. There is mild atherosclerotic calcification. There is no adenopathy in the abdomen or pelvis.  Reproductive: Marked prostatic enlargement. Unremarkable seminal vesicles.  Other: No acute inflammatory changes are evident in the abdomen or pelvis.  There is a wide-mouthed umbilical hernia containing a loop of unobstructed small bowel. The small bowel extends all the way to the shallow subcutaneous tissues within the hernia.  Musculoskeletal: No significant skeletal lesion. Prior lower lumbar surgical decompression posteriorly.  IMPRESSION: 1. No acute  inflammatory changes are evident in the abdomen or pelvis. 2. Umbilical hernia containing loop of unobstructed small bowel.   Electronically Signed   By: Ellery Plunk M.D.   On: 03/20/2016 22:27  Assessment: 1. Dysphagia: Somewhat chronic for the patient, last worked up in 2012 with an EGD showing residual food in his stomach as well as possible presbyesophagus and esophagram showing dysmotility, patient continues with symptoms, worsened over the past few years, now regurgitates water on occasion; consider esophageal dysmotility versus ring versus web versus mass vs gastroparesis 2. Abdominal pain: Only over area of umbilical hernia, not constant, patient needs to follow with surgery regarding this 3. Weight loss: A total of around 40 pounds since March of last year 4. Chronic anticoagulation: With Xarelto after recent pulmonary embolism in November of last year  Plan: 1. Will need to discuss with Dr. Myrtie Neither and/or patient's cardiologist regarding possibly taking patient off of blood thinner for a total of 24-48 hours after very recent pulmonary embolism for EGD and colonoscopy. EGD may need to be pursued if patient has stricture on esophagram and certainly with weight loss colonoscopy would be recommended. 2. Ordered barium esophagram for further evaluation of dysphagia 3. Provided the patient with information regarding a dysphagia 3 diet. Recommend he follow this 4. Described omeprazole 40 mg twice a day, 30-60 minutes before eating breakfast and dinner #60 with 5 refills, patient should discontinue his Nexium 5. Patient to  follow in clinic with Dr. Myrtie Neither or myself in the next 3-4 weeks or sooner if necessary as indicated by barium esophagram  Hyacinth Meeker, PA-C [facility] 07/20/2016, 1:54 PM  Cc: Crist Fat, MD   Thank you for sending this case to me. I have reviewed the entire note, and the outlined plan seems appropriate.  This patient appears to have  long-standing esophageal motility disorder.  I agree with barium study since it has been 5 years since last done.  There is no evidence of a colon mass on Sept 2017 CT scan of abdomen/pelvis to account for the reported weight loss.   As such, I would not plan to perform a colonoscopy for weight loss at this juncture, especially since it has only been two months since his PE.  I can see him in clinic to re-evaluate after barium study.

## 2016-07-28 ENCOUNTER — Ambulatory Visit (HOSPITAL_COMMUNITY): Admission: RE | Admit: 2016-07-28 | Payer: Medicare Other | Source: Ambulatory Visit

## 2016-08-09 ENCOUNTER — Ambulatory Visit (HOSPITAL_COMMUNITY)
Admission: RE | Admit: 2016-08-09 | Discharge: 2016-08-09 | Disposition: A | Discharge status: Home / Self Care | Payer: Medicare Other | Source: Ambulatory Visit | Attending: Physician Assistant | Admitting: Physician Assistant

## 2016-08-09 DIAGNOSIS — R1310 Dysphagia, unspecified: Secondary | ICD-10-CM

## 2016-08-09 DIAGNOSIS — K228 Other specified diseases of esophagus: Secondary | ICD-10-CM | POA: Diagnosis not present

## 2016-08-09 DIAGNOSIS — R634 Abnormal weight loss: Secondary | ICD-10-CM | POA: Diagnosis not present

## 2016-08-09 DIAGNOSIS — K222 Esophageal obstruction: Secondary | ICD-10-CM | POA: Insufficient documentation

## 2016-08-09 DIAGNOSIS — K219 Gastro-esophageal reflux disease without esophagitis: Secondary | ICD-10-CM | POA: Diagnosis not present

## 2016-08-18 ENCOUNTER — Ambulatory Visit: Payer: Medicare Other | Admitting: Gastroenterology

## 2016-08-18 DIAGNOSIS — M2041 Other hammer toe(s) (acquired), right foot: Secondary | ICD-10-CM | POA: Diagnosis not present

## 2016-08-18 DIAGNOSIS — B351 Tinea unguium: Secondary | ICD-10-CM | POA: Diagnosis not present

## 2016-08-18 DIAGNOSIS — M2042 Other hammer toe(s) (acquired), left foot: Secondary | ICD-10-CM | POA: Diagnosis not present

## 2016-08-18 DIAGNOSIS — E119 Type 2 diabetes mellitus without complications: Secondary | ICD-10-CM | POA: Diagnosis not present

## 2016-08-22 ENCOUNTER — Telehealth: Payer: Self-pay | Admitting: Gastroenterology

## 2016-08-22 ENCOUNTER — Ambulatory Visit: Payer: Medicare Other | Admitting: Gastroenterology

## 2016-09-06 ENCOUNTER — Encounter: Payer: Self-pay | Admitting: Gastroenterology

## 2016-09-06 ENCOUNTER — Ambulatory Visit (INDEPENDENT_AMBULATORY_CARE_PROVIDER_SITE_OTHER): Payer: Medicare Other | Admitting: Gastroenterology

## 2016-09-06 VITALS — BP 140/74 | HR 88 | Ht 69.5 in | Wt 261.2 lb

## 2016-09-06 DIAGNOSIS — R1310 Dysphagia, unspecified: Secondary | ICD-10-CM

## 2016-09-06 DIAGNOSIS — I2692 Saddle embolus of pulmonary artery without acute cor pulmonale: Secondary | ICD-10-CM | POA: Diagnosis not present

## 2016-09-06 DIAGNOSIS — R1319 Other dysphagia: Secondary | ICD-10-CM

## 2016-09-06 DIAGNOSIS — R634 Abnormal weight loss: Secondary | ICD-10-CM | POA: Diagnosis not present

## 2016-09-06 NOTE — Progress Notes (Signed)
 GI Progress Note  Chief Complaint: Dysphagia and weight loss  Subjective  History:  Andrew Winters follows up with me today after seeing her physician assistant last month. He had previously seen Dr. Juanda Chance, see reports for details. He later saw a GI physician in [place] who apparently did not upper endoscopy one or 2 times with dilation, by the wife's report. None of that ever seemed to help his dysphagia for very long. He has dysphagia with both solids and liquids, sometimes even just taking a little water. He feels he can get stuck and will come right back up on him. He is lost about 40 pounds in the last 6 months, most of it unintentional. He is CT scan of the abdomen and pelvis September 2017 that did not show a mass or other apparent source for weight loss. He had a saddle pulmonary embolism in November 2017 and is on long-term [facility] for that. He denies altered bowel habits or rectal bleeding. He was having some pain from an umbilical hernia that has since subsided.  ROS: Cardiovascular:  no chest pain Respiratory: Dyspnea with minimal exertion  The patient's Past Medical, Family and Social History were reviewed and are on file in the EMR.  Objective:  Med list reviewed  Vital signs in last 24 hrs: Vitals:   09/06/16 1146  BP: 140/74  Pulse: 88    Physical Exam    HEENT: sclera anicteric, oral mucosa moist without lesions  Neck: supple, no thyromegaly, JVD or lymphadenopathy  Cardiac: RRR without murmurs, S1S2 heard, no peripheral edema  Pulm: clear to auscultation bilaterally, normal RR and effort noted  Abdomen: soft, morbidly obese, no tenderness, with active bowel sounds. No guarding or palpable hepatosplenomegaly. Small easily reducible umbilical hernia  Skin; warm and dry, no jaundice or rash  Radiologic studies:  I personally reviewed the images from his recent barium swallow. There is diffuse esophageal dilation of a long smooth tapering at the end.  The radiologist indicates that during fluoroscopy there was severe dysmotility with tertiary waves and poor emptying.   @ASSESSMENTPLANBEGIN @ Assessment: Encounter Diagnoses  Name Primary?  . Acute saddle pulmonary embolism without acute cor pulmonale (HCC) Yes  . Esophageal dysphagia   . Abnormal loss of weight     I think his barium study looks like classic achalasia.  I'm not planning an EGD or colonoscopy right now because he is on oral anticoagulation and his PE was only 3 months ago. Also, I do not become likely to find the answer to his problem dysphagia or weight loss on either study.  We have scheduled esophageal motility study to see if he might have achalasia. If so, then I will perform an upper endoscopy to rule out a mass and pseudo-achalasia.  I explained this all in detail to him and his wife and all their questions were answered.  Total time 30 minutes, over half spent in counseling and coordination of care.   III

## 2016-09-06 NOTE — Patient Instructions (Signed)
You have been scheduled for an esophageal manometry at [facility] on 09-21-2016 at 1230pm. Please arrive 30 minutes prior to your procedure for registration. You will need to go to outpatient registration (1st floor of the hospital) first. Make certain to bring your insurance cards as well as a complete list of medications.  Please remember the following:  1) Do not take any muscle relaxants, xanax (alprazolam) or ativan for 1 day prior to your     test as well as the day of the test.  2) Nothing to eat or drink after 12:00 midnight on the night before your test.  3) Hold all diabetic medications/insulin the morning of the test. You may eat and take             your medications after the test.  It will take at least 2 weeks to receive the results of this test from your physician. ------------------------------------------ ABOUT ESOPHAGEAL MANOMETRY Esophageal manometry (muh-NOM-uh-tree) is a test that gauges how well your esophagus works. Your esophagus is the long, muscular tube that connects your throat to your stomach. Esophageal manometry measures the rhythmic muscle contractions (peristalsis) that occur in your esophagus when you swallow. Esophageal manometry also measures the coordination and force exerted by the muscles of your esophagus.  During esophageal manometry, a thin, flexible tube (catheter) that contains sensors is passed through your nose, down your esophagus and into your stomach. Esophageal manometry can be helpful in diagnosing some mostly uncommon disorders that affect your esophagus.  Why it's done Esophageal manometry is used to evaluate the movement (motility) of food through the esophagus and into the stomach. The test measures how well the circular bands of muscle (sphincters) at the top and bottom of your esophagus open and close, as well as the pressure, strength and pattern of the wave of esophageal muscle contractions that moves food along.  What you can  expect Esophageal manometry is an outpatient procedure done without sedation. Most people tolerate it well. You may be asked to change into a hospital gown before the test starts.  During esophageal manometry  While you are sitting up, a member of your health care team sprays your throat with a numbing medication or puts numbing gel in your nose or both.  A catheter is guided through your nose into your esophagus. The catheter may be sheathed in a water-filled sleeve. It doesn't interfere with your breathing. However, your eyes may water, and you may gag. You may have a slight nosebleed from irritation.  After the catheter is in place, you may be asked to lie on your back on an exam table, or you may be asked to remain seated.  You then swallow small sips of water. As you do, a computer connected to the catheter records the pressure, strength and pattern of your esophageal muscle contractions.  During the test, you'll be asked to breathe slowly and smoothly, remain as still as possible, and swallow only when you're asked to do so.  A member of your health care team may move the catheter down into your stomach while the catheter continues its measurements.  The catheter then is slowly withdrawn. The test usually lasts 20 to 30 minutes.  After esophageal manometry  When your esophageal manometry is complete, you may return to your normal activities  This test typically takes 30-45 minutes to complete. _________________________________________________________________________

## 2016-09-11 ENCOUNTER — Inpatient Hospital Stay (HOSPITAL_COMMUNITY)
Admission: EM | Admit: 2016-09-11 | Discharge: 2016-09-17 | DRG: 391 | Disposition: A | Discharge status: Skilled Nursing Facility | Payer: Medicare Other | Attending: Internal Medicine | Admitting: Internal Medicine

## 2016-09-11 ENCOUNTER — Other Ambulatory Visit: Payer: Self-pay

## 2016-09-11 ENCOUNTER — Emergency Department (HOSPITAL_COMMUNITY): Payer: Medicare Other

## 2016-09-11 ENCOUNTER — Encounter (HOSPITAL_COMMUNITY): Payer: Self-pay | Admitting: Emergency Medicine

## 2016-09-11 DIAGNOSIS — G44009 Cluster headache syndrome, unspecified, not intractable: Secondary | ICD-10-CM | POA: Diagnosis not present

## 2016-09-11 DIAGNOSIS — E876 Hypokalemia: Secondary | ICD-10-CM

## 2016-09-11 DIAGNOSIS — K529 Noninfective gastroenteritis and colitis, unspecified: Secondary | ICD-10-CM | POA: Diagnosis not present

## 2016-09-11 DIAGNOSIS — F329 Major depressive disorder, single episode, unspecified: Secondary | ICD-10-CM | POA: Diagnosis present

## 2016-09-11 DIAGNOSIS — Z7984 Long term (current) use of oral hypoglycemic drugs: Secondary | ICD-10-CM

## 2016-09-11 DIAGNOSIS — E1151 Type 2 diabetes mellitus with diabetic peripheral angiopathy without gangrene: Secondary | ICD-10-CM | POA: Diagnosis not present

## 2016-09-11 DIAGNOSIS — R1310 Dysphagia, unspecified: Secondary | ICD-10-CM

## 2016-09-11 DIAGNOSIS — R079 Chest pain, unspecified: Secondary | ICD-10-CM | POA: Diagnosis not present

## 2016-09-11 DIAGNOSIS — Z96651 Presence of right artificial knee joint: Secondary | ICD-10-CM | POA: Diagnosis present

## 2016-09-11 DIAGNOSIS — K224 Dyskinesia of esophagus: Secondary | ICD-10-CM | POA: Diagnosis present

## 2016-09-11 DIAGNOSIS — Z87891 Personal history of nicotine dependence: Secondary | ICD-10-CM

## 2016-09-11 DIAGNOSIS — H9192 Unspecified hearing loss, left ear: Secondary | ICD-10-CM | POA: Diagnosis not present

## 2016-09-11 DIAGNOSIS — F0150 Vascular dementia without behavioral disturbance: Secondary | ICD-10-CM | POA: Diagnosis present

## 2016-09-11 DIAGNOSIS — E559 Vitamin D deficiency, unspecified: Secondary | ICD-10-CM | POA: Diagnosis not present

## 2016-09-11 DIAGNOSIS — D638 Anemia in other chronic diseases classified elsewhere: Secondary | ICD-10-CM | POA: Diagnosis present

## 2016-09-11 DIAGNOSIS — I119 Hypertensive heart disease without heart failure: Secondary | ICD-10-CM | POA: Diagnosis not present

## 2016-09-11 DIAGNOSIS — R0602 Shortness of breath: Secondary | ICD-10-CM | POA: Diagnosis not present

## 2016-09-11 DIAGNOSIS — Z6838 Body mass index (BMI) 38.0-38.9, adult: Secondary | ICD-10-CM

## 2016-09-11 DIAGNOSIS — M069 Rheumatoid arthritis, unspecified: Secondary | ICD-10-CM | POA: Diagnosis not present

## 2016-09-11 DIAGNOSIS — E43 Unspecified severe protein-calorie malnutrition: Secondary | ICD-10-CM | POA: Diagnosis not present

## 2016-09-11 DIAGNOSIS — Z833 Family history of diabetes mellitus: Secondary | ICD-10-CM

## 2016-09-11 DIAGNOSIS — Z86718 Personal history of other venous thrombosis and embolism: Secondary | ICD-10-CM

## 2016-09-11 DIAGNOSIS — K449 Diaphragmatic hernia without obstruction or gangrene: Secondary | ICD-10-CM | POA: Diagnosis present

## 2016-09-11 DIAGNOSIS — Z86711 Personal history of pulmonary embolism: Secondary | ICD-10-CM

## 2016-09-11 DIAGNOSIS — N400 Enlarged prostate without lower urinary tract symptoms: Secondary | ICD-10-CM | POA: Diagnosis present

## 2016-09-11 DIAGNOSIS — B961 Klebsiella pneumoniae [K. pneumoniae] as the cause of diseases classified elsewhere: Secondary | ICD-10-CM | POA: Diagnosis not present

## 2016-09-11 DIAGNOSIS — E1142 Type 2 diabetes mellitus with diabetic polyneuropathy: Secondary | ICD-10-CM | POA: Diagnosis not present

## 2016-09-11 DIAGNOSIS — K219 Gastro-esophageal reflux disease without esophagitis: Secondary | ICD-10-CM | POA: Diagnosis not present

## 2016-09-11 DIAGNOSIS — I10 Essential (primary) hypertension: Secondary | ICD-10-CM | POA: Diagnosis not present

## 2016-09-11 DIAGNOSIS — E0590 Thyrotoxicosis, unspecified without thyrotoxic crisis or storm: Secondary | ICD-10-CM | POA: Diagnosis not present

## 2016-09-11 DIAGNOSIS — R001 Bradycardia, unspecified: Secondary | ICD-10-CM | POA: Diagnosis present

## 2016-09-11 DIAGNOSIS — K222 Esophageal obstruction: Principal | ICD-10-CM | POA: Diagnosis present

## 2016-09-11 DIAGNOSIS — R0789 Other chest pain: Secondary | ICD-10-CM | POA: Diagnosis not present

## 2016-09-11 DIAGNOSIS — N390 Urinary tract infection, site not specified: Secondary | ICD-10-CM | POA: Diagnosis not present

## 2016-09-11 DIAGNOSIS — E669 Obesity, unspecified: Secondary | ICD-10-CM | POA: Diagnosis present

## 2016-09-11 DIAGNOSIS — Z7901 Long term (current) use of anticoagulants: Secondary | ICD-10-CM

## 2016-09-11 DIAGNOSIS — E785 Hyperlipidemia, unspecified: Secondary | ICD-10-CM | POA: Diagnosis not present

## 2016-09-11 DIAGNOSIS — R627 Adult failure to thrive: Secondary | ICD-10-CM | POA: Diagnosis present

## 2016-09-11 DIAGNOSIS — Z6837 Body mass index (BMI) 37.0-37.9, adult: Secondary | ICD-10-CM

## 2016-09-11 DIAGNOSIS — Z886 Allergy status to analgesic agent status: Secondary | ICD-10-CM

## 2016-09-11 DIAGNOSIS — R509 Fever, unspecified: Secondary | ICD-10-CM

## 2016-09-11 LAB — CBC
HCT: 33.7 % — ABNORMAL LOW (ref 39.0–52.0)
Hemoglobin: 10.5 g/dL — ABNORMAL LOW (ref 13.0–17.0)
MCH: 26.1 pg (ref 26.0–34.0)
MCHC: 31.2 g/dL (ref 30.0–36.0)
MCV: 83.8 fL (ref 78.0–100.0)
PLATELETS: 179 10*3/uL (ref 150–400)
RBC: 4.02 MIL/uL — ABNORMAL LOW (ref 4.22–5.81)
RDW: 18.4 % — ABNORMAL HIGH (ref 11.5–15.5)
WBC: 9.7 10*3/uL (ref 4.0–10.5)

## 2016-09-11 LAB — COMPREHENSIVE METABOLIC PANEL
ALT: 11 U/L — ABNORMAL LOW (ref 17–63)
AST: 15 U/L (ref 15–41)
Albumin: 3.1 g/dL — ABNORMAL LOW (ref 3.5–5.0)
Alkaline Phosphatase: 53 U/L (ref 38–126)
Anion gap: 8 (ref 5–15)
BUN: 8 mg/dL (ref 6–20)
CHLORIDE: 108 mmol/L (ref 101–111)
CO2: 28 mmol/L (ref 22–32)
CREATININE: 1.19 mg/dL (ref 0.61–1.24)
Calcium: 8.4 mg/dL — ABNORMAL LOW (ref 8.9–10.3)
GFR calc Af Amer: >60 mL/min (ref >60)
GFR, EST NON AFRICAN AMERICAN: 58 mL/min — AB (ref >60)
Glucose, Bld: 120 mg/dL — ABNORMAL HIGH (ref 65–99)
Potassium: 2.6 mmol/L — CL (ref 3.5–5.1)
Sodium: 144 mmol/L (ref 135–145)
Total Bilirubin: 0.7 mg/dL (ref 0.3–1.2)
Total Protein: 6.4 g/dL — ABNORMAL LOW (ref 6.5–8.1)

## 2016-09-11 LAB — BASIC METABOLIC PANEL
ANION GAP: 6 (ref 5–15)
BUN: 7 mg/dL (ref 6–20)
CHLORIDE: 114 mmol/L — AB (ref 101–111)
CO2: 25 mmol/L (ref 22–32)
Calcium: 8.4 mg/dL — ABNORMAL LOW (ref 8.9–10.3)
Creatinine, Ser: 1 mg/dL (ref 0.61–1.24)
GFR calc non Af Amer: >60 mL/min (ref >60)
Glucose, Bld: 93 mg/dL (ref 65–99)
Potassium: 2.8 mmol/L — ABNORMAL LOW (ref 3.5–5.1)
SODIUM: 145 mmol/L (ref 135–145)

## 2016-09-11 LAB — GLUCOSE, CAPILLARY
Glucose-Capillary: 60 mg/dL — ABNORMAL LOW (ref 65–99)
Glucose-Capillary: 76 mg/dL (ref 65–99)
Glucose-Capillary: 101 mg/dL — ABNORMAL HIGH (ref 65–99)

## 2016-09-11 LAB — I-STAT TROPONIN, ED: TROPONIN I, POC: 0.01 ng/mL (ref 0.00–0.08)

## 2016-09-11 LAB — TROPONIN I
TROPONIN I: 0.03 ng/mL — AB (ref <0.03)
Troponin I: 0.03 ng/mL

## 2016-09-11 LAB — TSH: TSH: 0.525 u[IU]/mL (ref 0.350–4.500)

## 2016-09-11 LAB — PREALBUMIN: Prealbumin: 12.7 mg/dL — ABNORMAL LOW (ref 18–38)

## 2016-09-11 MED ORDER — FAMOTIDINE 20 MG PO TABS
20.0000 mg | ORAL_TABLET | Freq: Every day | ORAL | Status: DC
  Administered 2016-09-11 – 2016-09-17 (×6): 20 mg via ORAL
  Filled 2016-09-11 (×7): qty 1

## 2016-09-11 MED ORDER — INSULIN ASPART 100 UNIT/ML ~~LOC~~ SOLN
0.0000 [IU] | SUBCUTANEOUS | Status: DC
  Administered 2016-09-13: 2 [IU] via SUBCUTANEOUS
  Administered 2016-09-14: 1 [IU] via SUBCUTANEOUS
  Administered 2016-09-14: 2 [IU] via SUBCUTANEOUS
  Administered 2016-09-14: 1 [IU] via SUBCUTANEOUS
  Administered 2016-09-14 – 2016-09-17 (×6): 2 [IU] via SUBCUTANEOUS

## 2016-09-11 MED ORDER — BISACODYL 10 MG RE SUPP: 10.0000 mg | Freq: Every day | RECTAL | Status: DC | PRN

## 2016-09-11 MED ORDER — GLIMEPIRIDE 4 MG PO TABS
4.0000 mg | ORAL_TABLET | Freq: Two times a day (BID) | ORAL | Status: DC
  Filled 2016-09-11: qty 1

## 2016-09-11 MED ORDER — POTASSIUM CHLORIDE CRYS ER 10 MEQ PO TBCR
10.0000 meq | EXTENDED_RELEASE_TABLET | Freq: Every day | ORAL | Status: DC
  Administered 2016-09-11 – 2016-09-17 (×6): 10 meq via ORAL
  Filled 2016-09-11 (×7): qty 1

## 2016-09-11 MED ORDER — ATORVASTATIN CALCIUM 40 MG PO TABS
40.0000 mg | ORAL_TABLET | Freq: Every day | ORAL | Status: DC
  Administered 2016-09-11 – 2016-09-16 (×6): 40 mg via ORAL
  Filled 2016-09-11 (×7): qty 1

## 2016-09-11 MED ORDER — NITROGLYCERIN 2 % TD OINT
1.0000 [in_us] | TOPICAL_OINTMENT | Freq: Once | TRANSDERMAL | Status: AC
  Administered 2016-09-11: 1 [in_us] via TOPICAL
  Filled 2016-09-11: qty 1

## 2016-09-11 MED ORDER — TRAZODONE HCL 50 MG PO TABS
50.0000 mg | ORAL_TABLET | Freq: Every day | ORAL | Status: DC
  Administered 2016-09-11: 50 mg via ORAL
  Filled 2016-09-11: qty 1

## 2016-09-11 MED ORDER — TRAMADOL HCL 50 MG PO TABS
50.0000 mg | ORAL_TABLET | Freq: Three times a day (TID) | ORAL | Status: DC | PRN
  Administered 2016-09-11 – 2016-09-15 (×4): 50 mg via ORAL
  Filled 2016-09-11 (×5): qty 1

## 2016-09-11 MED ORDER — METFORMIN HCL ER 500 MG PO TB24
500.0000 mg | ORAL_TABLET | Freq: Every day | ORAL | Status: DC
  Administered 2016-09-12 – 2016-09-13 (×2): 500 mg via ORAL
  Filled 2016-09-11 (×3): qty 1

## 2016-09-11 MED ORDER — FUROSEMIDE 20 MG PO TABS
20.0000 mg | ORAL_TABLET | Freq: Every day | ORAL | Status: DC
  Administered 2016-09-12 – 2016-09-13 (×2): 20 mg via ORAL
  Filled 2016-09-11 (×3): qty 1

## 2016-09-11 MED ORDER — LORATADINE 10 MG PO TABS
10.0000 mg | ORAL_TABLET | Freq: Every day | ORAL | Status: DC
  Administered 2016-09-11 – 2016-09-17 (×6): 10 mg via ORAL
  Filled 2016-09-11 (×7): qty 1

## 2016-09-11 MED ORDER — ACETAMINOPHEN 500 MG PO TABS
1000.0000 mg | ORAL_TABLET | Freq: Four times a day (QID) | ORAL | Status: DC | PRN
  Administered 2016-09-13 – 2016-09-15 (×2): 1000 mg via ORAL
  Filled 2016-09-11 (×2): qty 2

## 2016-09-11 MED ORDER — VITAMIN D3 25 MCG (1000 UNIT) PO TABS
1000.0000 [IU] | ORAL_TABLET | Freq: Two times a day (BID) | ORAL | Status: DC
  Administered 2016-09-11 – 2016-09-17 (×11): 1000 [IU] via ORAL
  Filled 2016-09-11 (×12): qty 1

## 2016-09-11 MED ORDER — POTASSIUM CHLORIDE IN NACL 40-0.9 MEQ/L-% IV SOLN
INTRAVENOUS | Status: DC
  Administered 2016-09-11 – 2016-09-12 (×2): 75 mL/h via INTRAVENOUS
  Filled 2016-09-11 (×2): qty 1000

## 2016-09-11 MED ORDER — ONDANSETRON HCL 4 MG/2ML IJ SOLN
4.0000 mg | Freq: Four times a day (QID) | INTRAMUSCULAR | Status: DC | PRN
  Administered 2016-09-11: 4 mg via INTRAVENOUS
  Filled 2016-09-11 (×2): qty 2

## 2016-09-11 MED ORDER — SODIUM CHLORIDE 0.9 % IV SOLN: INTRAVENOUS | Status: DC

## 2016-09-11 MED ORDER — SODIUM CHLORIDE 0.9 % IV SOLN
30.0000 meq | Freq: Once | INTRAVENOUS | Status: AC
  Administered 2016-09-11: 30 meq via INTRAVENOUS
  Filled 2016-09-11: qty 15

## 2016-09-11 MED ORDER — RIVAROXABAN 20 MG PO TABS
20.0000 mg | ORAL_TABLET | Freq: Every day | ORAL | Status: DC
  Administered 2016-09-11 – 2016-09-17 (×6): 20 mg via ORAL
  Filled 2016-09-11 (×7): qty 1

## 2016-09-11 MED ORDER — FERROUS SULFATE 325 (65 FE) MG PO TABS
325.0000 mg | ORAL_TABLET | Freq: Every day | ORAL | Status: DC
  Administered 2016-09-12 – 2016-09-17 (×5): 325 mg via ORAL
  Filled 2016-09-11 (×6): qty 1

## 2016-09-11 MED ORDER — IOPAMIDOL (ISOVUE-370) INJECTION 76%
INTRAVENOUS | Status: AC
  Filled 2016-09-11: qty 100

## 2016-09-11 MED ORDER — VITAMIN E 180 MG (400 UNIT) PO CAPS
400.0000 [IU] | ORAL_CAPSULE | Freq: Every day | ORAL | Status: DC
  Administered 2016-09-12 – 2016-09-17 (×5): 400 [IU] via ORAL
  Filled 2016-09-11 (×6): qty 1

## 2016-09-11 MED ORDER — SODIUM CHLORIDE 0.9 % IV SOLN
30.0000 meq | Freq: Once | INTRAVENOUS | Status: DC
  Administered 2016-09-11: 30 meq via INTRAVENOUS
  Filled 2016-09-11: qty 15

## 2016-09-11 MED ORDER — SERTRALINE HCL 25 MG PO TABS
25.0000 mg | ORAL_TABLET | Freq: Every day | ORAL | Status: DC
  Administered 2016-09-11 – 2016-09-17 (×6): 25 mg via ORAL
  Filled 2016-09-11 (×7): qty 1

## 2016-09-11 MED ORDER — AMLODIPINE BESYLATE 5 MG PO TABS
5.0000 mg | ORAL_TABLET | Freq: Every day | ORAL | Status: DC
  Administered 2016-09-11 – 2016-09-12 (×2): 5 mg via ORAL
  Filled 2016-09-11 (×2): qty 1

## 2016-09-11 MED ORDER — LISINOPRIL 10 MG PO TABS
10.0000 mg | ORAL_TABLET | Freq: Every day | ORAL | Status: DC
  Administered 2016-09-11 – 2016-09-17 (×6): 10 mg via ORAL
  Filled 2016-09-11 (×7): qty 1

## 2016-09-11 MED ORDER — SODIUM CHLORIDE 0.9% FLUSH
3.0000 mL | Freq: Two times a day (BID) | INTRAVENOUS | Status: DC
  Administered 2016-09-12 – 2016-09-17 (×6): 3 mL via INTRAVENOUS

## 2016-09-11 MED ORDER — DOCUSATE SODIUM 100 MG PO CAPS: 100.0000 mg | ORAL_CAPSULE | Freq: Two times a day (BID) | ORAL | Status: DC | PRN

## 2016-09-11 MED ORDER — GUAIFENESIN 200 MG PO TABS
400.0000 mg | ORAL_TABLET | Freq: Four times a day (QID) | ORAL | Status: DC | PRN
  Filled 2016-09-11: qty 2

## 2016-09-11 MED ORDER — IOPAMIDOL (ISOVUE-370) INJECTION 76%
100.0000 mL | Freq: Once | INTRAVENOUS | Status: AC | PRN
  Administered 2016-09-11: 100 mL via INTRAVENOUS

## 2016-09-11 MED ORDER — KCL IN DEXTROSE-NACL 40-5-0.9 MEQ/L-%-% IV SOLN
INTRAVENOUS | Status: DC
Start: 2016-09-11 — End: 2016-09-11

## 2016-09-11 NOTE — ED Notes (Signed)
ED Provider at bedside. 

## 2016-09-11 NOTE — H&P (Signed)
History and Physical    Andrew Winters KNL:[id number] DOB: 04/29/1941 DOA: 09/11/2016  Referring MD/NP/PA: EDP PCP: Crist Fat, MD  Outpatient Specialists: DR Alesia Banda GI Patient coming from: Home Chief Complaint: Chest pain       HPI: Andrew Winters is a 76 y.o. male with medical history significant for but not limited to recent PE in November 2017 on Xarelto, type 2 diabetes, hypertension and hyperlipidemia  He presented to the [facility] ED with his wife for 2 day history of sternal nonradiating chest pain described as sharp, 7-8/10, not necessarily aggravated by exertion or relieved by rest, lasting for several hours.Denies fever, chills, cough or sputum production, rhinorrhea, myalgia.  He was discharged from the hospital on 05/24/2016 after admission for chest pain on 05/20/2013 at which time ACS was ruled out and had an unremarkable echocardiogram.  Patient has long-standing problems with dysphagia and had been seen Dr. Juanda Chance previously of [facility], and currently seeing a new doctor Dr. Maurine Minister. He has had esophageal interventions including stretching and manometry and is currently set up for another procedure soon.  Wife intimates that since January 2018 patient has continued to be weak with low poor intake and has been feeding him with pure diet because anytime he drinks or eats liquid or solid foods respectively "it comes right back up". He has actually lost about 40 pounds according to the wife.  He currently lives with his wife at home by themselves and has been a little difficult to take care of him by herself due to his progressive weakness.      ED Course: At the ED patient was hemodynamically stable with O2 saturation of 95%. 12-lead EKG was negative for acute ischemic changes, and CT scan was negative for PE. He was given potassium for his hypokalemia. Hospitalist was consulted for admission  Review of Systems: As per HPI otherwise 10 point  review of systems negative.   Past Medical History:  Diagnosis Date  . Acute saddle pulmonary embolism (HCC) 05/20/2016  . Anxiety   . Bilateral swelling of feet   . BPH (benign prostatic hyperplasia)    no meds needed per medical md  . Cataracts, bilateral    immature  . Chronic diarrhea   . Deafness    in left ear   . Depression    takes Zoloft daily  . Diabetes mellitus    takes Actos and Amaryl daily as well as Onglyza  . Dysphagia   . Fatigue   . GERD (gastroesophageal reflux disease)    takes Omeprazole daily  . Headaches, cluster   . Hiatal hernia   . Hyperlipemia    was on meds but hasnt been on anything in about 27months per MD  . Hypertension    takes Amlodipine,HCTZ,and Lisinopril daily  . Hyperthyroidism   . Insomnia    takes Trazodone nightly  . Joint pain   . Joint swelling   . Muscle pain   . Nocturia   . Osteoarthritis   . Peripheral neuropathy (HCC)    takes Gabapentin daily  . Pneumonia 3+ yrs ago  . Presbyesophagus   . Rheumatoid arthritis(714.0)   . Vitamin D deficiency    takes Vit D daily    Past Surgical History:  Procedure Laterality Date  . BACK SURGERY  1998  . COLONOSCOPY WITH ESOPHAGOGASTRODUODENOSCOPY (EGD) AND ESOPHAGEAL DILATION (ED)    . IRRIGATION AND DEBRIDEMENT KNEE Right 01/12/2015  . TOTAL KNEE ARTHROPLASTY Right 01/06/2015  Procedure: TOTAL KNEE ARTHROPLASTY;  Surgeon: Marcene Corning, MD;  Location: [facility] OR;  Service: Orthopedics;  Laterality: Right;     reports that he quit smoking about 22 months ago. His smoking use included Cigarettes. He has a 35.00 pack-year smoking history. He has never used smokeless tobacco. He reports that he does not drink alcohol or use drugs.  Allergies  Allergen Reactions  . Aspirin Diarrhea    Family History  Problem Relation Age of Onset  . Heart disease Mother   . Diabetes Mother   . Heart attack Father   . Diabetes Brother   . Colon cancer Neg Hx   . Stomach cancer Neg Hx   .  Esophageal cancer Neg Hx   . Rectal cancer Neg Hx   . Liver cancer Neg Hx      Prior to Admission medications   Medication Sig Start Date End Date Taking? Authorizing Provider  acetaminophen (TYLENOL) 500 MG tablet Take 1,000 mg by mouth every 6 (six) hours as needed for mild pain, moderate pain or headache.    Yes Historical Provider, MD  amLODipine (NORVASC) 5 MG tablet Take 5 mg by mouth daily.   Yes Historical Provider, MD  atorvastatin (LIPITOR) 40 MG tablet Take 40 mg by mouth at bedtime.    Yes Historical Provider, MD  cetirizine (ZYRTEC) 10 MG tablet Take 10 mg by mouth daily.   Yes Historical Provider, MD  cholecalciferol (VITAMIN D) 1000 units tablet Take 1,000 Units by mouth 2 (two) times daily.   Yes Historical Provider, MD  docusate sodium (COLACE) 100 MG capsule Take 100 mg by mouth 2 (two) times daily as needed for mild constipation.   Yes Historical Provider, MD  ferrous sulfate 325 (65 FE) MG tablet Take 325 mg by mouth daily with breakfast.   Yes Historical Provider, MD  furosemide (LASIX) 20 MG tablet Take 20 mg by mouth daily.   Yes Historical Provider, MD  gabapentin (NEURONTIN) 100 MG capsule Take 100 mg by mouth 3 (three) times daily.     Yes Historical Provider, MD  glimepiride (AMARYL) 4 MG tablet Take 4 mg by mouth 2 (two) times daily.    Yes Historical Provider, MD  guaifenesin (HUMIBID E) 400 MG TABS tablet Take 400 mg by mouth every 6 (six) hours as needed (for cough and congestion).   Yes Historical Provider, MD  lisinopril (PRINIVIL,ZESTRIL) 10 MG tablet Take 10 mg by mouth daily.    Yes Historical Provider, MD  metFORMIN (GLUCOPHAGE-XR) 500 MG 24 hr tablet Take 500 mg by mouth daily. 04/26/16  Yes Historical Provider, MD  omeprazole (PRILOSEC OTC) 20 MG tablet Take 20 mg by mouth 2 (two) times daily.   Yes Historical Provider, MD  potassium chloride (K-DUR,KLOR-CON) 10 MEQ tablet Take 10 mEq by mouth daily.   Yes Historical Provider, MD  ranitidine (ZANTAC) 150  MG tablet Take 150 mg by mouth at bedtime.   Yes Historical Provider, MD  rivaroxaban (XARELTO) 20 MG TABS tablet Take 1 tablet (20 mg total) by mouth daily with supper. Take with food starting 06/15/2016 Patient taking differently: Take 20 mg by mouth daily. Take with food starting 06/15/2016 06/15/16  Yes Campbell Stall, MD  sertraline (ZOLOFT) 25 MG tablet Take 25 mg by mouth daily.    Yes Historical Provider, MD  traMADol (ULTRAM) 50 MG tablet Take 50 mg by mouth every 8 (eight) hours as needed for moderate pain.   Yes Historical Provider, MD  traZODone (DESYREL)  50 MG tablet Take 50 mg by mouth at bedtime.   Yes Historical Provider, MD  vitamin E 400 UNIT capsule Take 400 Units by mouth daily.   Yes Historical Provider, MD    Physical Exam: Vitals:   09/11/16 0900 09/11/16 0930 09/11/16 0945 09/11/16 1016  BP: 135/68 134/72  145/77  Pulse: 69 71 69 60  Resp: 16 22 22 18   Temp:      TempSrc:      SpO2: 96% 99% 96% 95%  Weight:      Height:          Constitutional: NAD, calm, comfortable Vitals:   09/11/16 0900 09/11/16 0930 09/11/16 0945 09/11/16 1016  BP: 135/68 134/72  145/77  Pulse: 69 71 69 60  Resp: 16 22 22 18   Temp:      TempSrc:      SpO2: 96% 99% 96% 95%  Weight:      Height:       Eyes: PERRL, (+) mild scleral pallor ENMT: Mucous membranes are moist. Posterior pharynx clear of any exudate or lesions.Normal dentition.  Neck: normal, supple, no masses, no thyromegaly Respiratory: clear to auscultation bilaterally, no wheezing, no crackles. Normal respiratory effort. No accessory muscle use.  Cardiovascular: Bradycardic, Regular rhythm, no murmurs / rubs / gallops. No extremity edema. 2+ pedal pulses. No carotid bruits.  Abdomen: no tenderness, no masses palpated. No hepatosplenomegaly. Bowel sounds positive.  Musculoskeletal: no clubbing / cyanosis. No joint deformity upper and lower extremities. Good ROM, no contractures. Normal muscle tone. (1+) bipedal pitting  edema  Skin: no rashes, lesions, ulcers. No induration Neurologic: CN 2-12 grossly intact. Sensation intact, DTR normal. Strength 5/5 in all 4.  Psychiatric: Normal judgment and insight. Alert and oriented x 3. Normal mood.   (  Labs on Admission: I have personally reviewed following labs and imaging studies  CBC:  Recent Labs Lab 09/11/16 0856  WBC 9.7  HGB 10.5*  HCT 33.7*  MCV 83.8  PLT 179   Basic Metabolic Panel:  Recent Labs Lab 09/11/16 0856  NA 144  K 2.6*  CL 108  CO2 28  GLUCOSE 120*  BUN 8  CREATININE 1.19  CALCIUM 8.4*   GFR: Estimated Creatinine Clearance: 68.1 mL/min (by C-G formula based on SCr of 1.19 mg/dL). Liver Function Tests:  Recent Labs Lab 09/11/16 0856  AST 15  ALT 11*  ALKPHOS 53  BILITOT 0.7  PROT 6.4*  ALBUMIN 3.1*   No results for input(s): LIPASE, AMYLASE in the last 168 hours. No results for input(s): AMMONIA in the last 168 hours. Coagulation Profile: No results for input(s): INR, PROTIME in the last 168 hours. Cardiac Enzymes: No results for input(s): CKTOTAL, CKMB, CKMBINDEX, TROPONINI in the last 168 hours. BNP (last 3 results) No results for input(s): PROBNP in the last 8760 hours. HbA1C: No results for input(s): HGBA1C in the last 72 hours. CBG: No results for input(s): GLUCAP in the last 168 hours. Lipid Profile: No results for input(s): CHOL, HDL, LDLCALC, TRIG, CHOLHDL, LDLDIRECT in the last 72 hours. Thyroid Function Tests: No results for input(s): TSH, T4TOTAL, FREET4, T3FREE, THYROIDAB in the last 72 hours. Anemia Panel: No results for input(s): VITAMINB12, FOLATE, FERRITIN, TIBC, IRON, RETICCTPCT in the last 72 hours. Urine analysis:    Component Value Date/Time   COLORURINE YELLOW 03/20/2016 2139   APPEARANCEUR CLEAR 03/20/2016 2139   LABSPEC 1.017 03/20/2016 2139   PHURINE 7.5 03/20/2016 2139   GLUCOSEU NEGATIVE 03/20/2016 2139  HGBUR NEGATIVE 03/20/2016 2139   BILIRUBINUR NEGATIVE 03/20/2016  2139   BILIRUBINUR negative 10/08/2015 1925   KETONESUR NEGATIVE 03/20/2016 2139   PROTEINUR NEGATIVE 03/20/2016 2139   UROBILINOGEN 1.0 10/08/2015 1925   UROBILINOGEN 0.2 01/07/2015 2223   NITRITE NEGATIVE 03/20/2016 2139   LEUKOCYTESUR NEGATIVE 03/20/2016 2139   Sepsis Labs: @LABRCNTIP (procalcitonin:4,lacticidven:4) )No results found for this or any previous visit (from the past 240 hour(s)).   Radiological Exams on Admission: Dg Chest 2 View  Result Date: 09/11/2016 CLINICAL DATA:  Patient reports SOB since yesterday. Pt has no other chest complaints. PT Hx: diabetes, HTN, pneumonia, ex smoker 2016 EXAM: CHEST  2 VIEW COMPARISON:  05/20/2016 FINDINGS: The heart is enlarged. There is elevation of right hemidiaphragm, stable in appearance. There are no focal consolidations or pleural effusions. No pulmonary edema. IMPRESSION: Stable cardiomegaly.  No evidence for acute pulmonary abnormality. Electronically Signed   By: Norva Pavlov M.D.   On: 09/11/2016 08:54   Ct Angio Chest Pe W Or Wo Contrast  Result Date: 09/11/2016 CLINICAL DATA:  Chest pain. EXAM: CT ANGIOGRAPHY CHEST WITH CONTRAST TECHNIQUE: Multidetector CT imaging of the chest was performed using the standard protocol during bolus administration of intravenous contrast. Multiplanar CT image reconstructions and MIPs were obtained to evaluate the vascular anatomy. CONTRAST:  100 mL of Isovue 370 intravenously. COMPARISON:  CT scan of May 20, 2016. FINDINGS: Cardiovascular: Satisfactory opacification of the pulmonary arteries to the segmental level. No evidence of pulmonary embolism. Normal heart size. No pericardial effusion. Atherosclerosis of thoracic aorta is noted without aneurysm. Mediastinum/Nodes: No enlarged mediastinal, hilar, or axillary lymph nodes. Thyroid gland, trachea, and esophagus demonstrate no significant findings. Lungs/Pleura: Minimal right apical scarring is noted. No pneumothorax or pleural effusion is noted.  5 mm nodule is noted in left lung apex. Upper Abdomen: No acute abnormality. Musculoskeletal: No chest wall abnormality. No acute or significant osseous findings. Review of the MIP images confirms the above findings. IMPRESSION: No definite evidence of pulmonary embolus. Aortic atherosclerosis. 5 mm left apical nodule is noted. No follow-up needed if patient is low-risk. Non-contrast chest CT can be considered in 12 months if patient is high-risk. This recommendation follows the consensus statement: Guidelines for Management of Incidental Pulmonary Nodules Detected on CT Images: From the Fleischner Society 2017; Radiology 2017; 284:228-243. Electronically Signed   By: Lupita Raider, M.D.   On: 09/11/2016 10:44    EKG: Independently reviewed. No acute ST-T wave changes  Assessment/Plan Active Problems:   * No active hospital problems. *  #1 Chest pains: Atypical Possible GI etiology 2D echo, 05/2016-Limited study quality, LVEF 55-60%.RVmildly dilated with mildly decreased systolic function.RVSP normal.  trend cardiac enzymes Supportive care    #2 Nausea with Vomitting: Supportive care with continued nausea and gentle hydration    #3 Hypokalemia: Due to upper GI loss  Potassium repletion     #4 Dysphagia: Outpatient GI follow-up High risk for aspiration- speech eval requested  #5 Protein Calorie Malnutrition: Secondary to dysphagia Check Prealbumin Nutrition consult  #6 Failure to Thrive : PT/OT/ SW eval for possible short term rehab  #7 Anemia of Chronic Illness: Clinical follow-up No anemia work up at this hospitalization  DVT prophylaxis: (on xarelto) Code Status: (Full) Family Communication: Wife and Son at Bedside Disposition Plan: To be determined- poss SNF Consults called:  Admission status: ( obs / tele)   OSEI-BONSU,Andrew Rane MD [facility] Pager [phone]  If 7PM-7AM, please contact night-coverage [email or web address] Password [facility]  09/11/2016,  11:48  AM

## 2016-09-11 NOTE — ED Provider Notes (Signed)
[facility]-EMERGENCY DEPT Provider Note   CSN: [id number] Arrival date & time: 09/11/16  0751     History   Chief Complaint Chief Complaint  Patient presents with  . Shortness of Breath    HPI Andrew Winters is a 76 y.o. male.   Shortness of Breath  This is a recurrent problem. The average episode lasts 1 day. The problem occurs continuously.The problem has been gradually worsening. Associated symptoms include chest pain. Pertinent negatives include no fever, no rhinorrhea, no cough, no sputum production, no hemoptysis, no wheezing and no leg swelling. Associated medical issues include PE and DVT. Associated medical issues do not include CAD or past MI. Associated medical issues comments: PE on Xarelto.  Chest Pain   This is a recurrent problem. The current episode started yesterday. The problem occurs constantly. The problem has not changed since onset.The pain is present in the substernal region. The quality of the pain is described as pressure-like. The pain does not radiate. Associated symptoms include shortness of breath. Pertinent negatives include no cough, no fever, no hemoptysis and no sputum production. Risk factors include sedentary lifestyle and male gender.  His past medical history is significant for DVT, hyperlipidemia, hypertension and PE.  Pertinent negatives for past medical history include no CAD, no CHF and no MI.  Procedure history is positive for cardiac catheterization (many yrs ago. no stents).    Report that this feels like prior PE.  Also report difficulty swallowing s/p dilatation (last was in Summer of 2017) and scheduled for esophageal manometry in 2 weeks by GI.   Past Medical History:  Diagnosis Date  . Acute saddle pulmonary embolism (HCC) 05/20/2016  . Anxiety   . Bilateral swelling of feet   . BPH (benign prostatic hyperplasia)    no meds needed per medical md  . Cataracts, bilateral    immature  . Chronic diarrhea   . Deafness    in left ear     . Depression    takes Zoloft daily  . Diabetes mellitus    takes Actos and Amaryl daily as well as Onglyza  . Dysphagia   . Fatigue   . GERD (gastroesophageal reflux disease)    takes Omeprazole daily  . Headaches, cluster   . Hiatal hernia   . Hyperlipemia    was on meds but hasnt been on anything in about 8months per MD  . Hypertension    takes Amlodipine,HCTZ,and Lisinopril daily  . Hyperthyroidism   . Insomnia    takes Trazodone nightly  . Joint pain   . Joint swelling   . Muscle pain   . Nocturia   . Osteoarthritis   . Peripheral neuropathy (HCC)    takes Gabapentin daily  . Pneumonia 3+ yrs ago  . Presbyesophagus   . Rheumatoid arthritis(714.0)   . Vitamin D deficiency    takes Vit D daily    Patient Active Problem List   Diagnosis Date Noted  . Pulmonary emboli (HCC) 05/20/2016  . Acute saddle pulmonary embolism without acute cor pulmonale (HCC) 05/20/2016  . Pseudogout of right knee 01/13/2015  . DVT of upper extremity (deep vein thrombosis): LEFT 01/11/2015  . Anemia, iron deficiency 01/11/2015  . Postoperative fever   . Fever 01/08/2015  . Diabetes mellitus type 2, controlled (HCC) 01/07/2015  . Essential hypertension 01/07/2015  . SIRS (systemic inflammatory response syndrome) (HCC) 01/07/2015  . Acute encephalopathy   . Primary osteoarthritis of right knee 01/06/2015  . Diabetes (HCC) 01/06/2015  .  Morbid obesity (HCC) 01/06/2015  . DIABETIC PERIPHERAL NEUROPATHY 01/05/2010  . ANKLE PAIN, LEFT 01/05/2010  . HEADACHE 05/07/2009  . UNSPECIFIED VITAMIN D DEFICIENCY 02/03/2009  . LIVER FUNCTION TESTS, ABNORMAL 07/19/2007  . HYPERTHYROIDISM 01/04/2007  . DIABETES MELLITUS, TYPE II 01/04/2007  . HYPERLIPIDEMIA 01/04/2007  . ERECTILE DYSFUNCTION 01/04/2007  . DEPRESSION 01/04/2007  . HYPERTENSION 01/04/2007  . GERD 01/04/2007  . BENIGN PROSTATIC HYPERTROPHY 01/04/2007  . OSTEOARTHRITIS 01/04/2007    Past Surgical History:  Procedure Laterality  Date  . BACK SURGERY  1998  . COLONOSCOPY WITH ESOPHAGOGASTRODUODENOSCOPY (EGD) AND ESOPHAGEAL DILATION (ED)    . IRRIGATION AND DEBRIDEMENT KNEE Right 01/12/2015  . TOTAL KNEE ARTHROPLASTY Right 01/06/2015   Procedure: TOTAL KNEE ARTHROPLASTY;  Surgeon: Marcene Corning, MD;  Location: [facility] OR;  Service: Orthopedics;  Laterality: Right;       Home Medications    Prior to Admission medications   Medication Sig Start Date End Date Taking? Authorizing Provider  acetaminophen (TYLENOL) 500 MG tablet Take 1,000 mg by mouth every 6 (six) hours as needed for mild pain, moderate pain or headache.    Yes Historical Provider, MD  amLODipine (NORVASC) 5 MG tablet Take 5 mg by mouth daily.   Yes Historical Provider, MD  atorvastatin (LIPITOR) 40 MG tablet Take 40 mg by mouth at bedtime.    Yes Historical Provider, MD  cetirizine (ZYRTEC) 10 MG tablet Take 10 mg by mouth daily.   Yes Historical Provider, MD  cholecalciferol (VITAMIN D) 1000 units tablet Take 1,000 Units by mouth 2 (two) times daily.   Yes Historical Provider, MD  docusate sodium (COLACE) 100 MG capsule Take 100 mg by mouth 2 (two) times daily as needed for mild constipation.   Yes Historical Provider, MD  ferrous sulfate 325 (65 FE) MG tablet Take 325 mg by mouth daily with breakfast.   Yes Historical Provider, MD  furosemide (LASIX) 20 MG tablet Take 20 mg by mouth daily.   Yes Historical Provider, MD  gabapentin (NEURONTIN) 100 MG capsule Take 100 mg by mouth 3 (three) times daily.     Yes Historical Provider, MD  glimepiride (AMARYL) 4 MG tablet Take 4 mg by mouth 2 (two) times daily.    Yes Historical Provider, MD  guaifenesin (HUMIBID E) 400 MG TABS tablet Take 400 mg by mouth every 6 (six) hours as needed (for cough and congestion).   Yes Historical Provider, MD  lisinopril (PRINIVIL,ZESTRIL) 10 MG tablet Take 10 mg by mouth daily.    Yes Historical Provider, MD  metFORMIN (GLUCOPHAGE-XR) 500 MG 24 hr tablet Take 500 mg by mouth  daily. 04/26/16  Yes Historical Provider, MD  omeprazole (PRILOSEC OTC) 20 MG tablet Take 20 mg by mouth 2 (two) times daily.   Yes Historical Provider, MD  potassium chloride (K-DUR,KLOR-CON) 10 MEQ tablet Take 10 mEq by mouth daily.   Yes Historical Provider, MD  ranitidine (ZANTAC) 150 MG tablet Take 150 mg by mouth at bedtime.   Yes Historical Provider, MD  rivaroxaban (XARELTO) 20 MG TABS tablet Take 1 tablet (20 mg total) by mouth daily with supper. Take with food starting 06/15/2016 Patient taking differently: Take 20 mg by mouth daily. Take with food starting 06/15/2016 06/15/16  Yes Campbell Stall, MD  sertraline (ZOLOFT) 25 MG tablet Take 25 mg by mouth daily.    Yes Historical Provider, MD  traMADol (ULTRAM) 50 MG tablet Take 50 mg by mouth every 8 (eight) hours as needed for moderate pain.  Yes Historical Provider, MD  traZODone (DESYREL) 50 MG tablet Take 50 mg by mouth at bedtime.   Yes Historical Provider, MD  vitamin E 400 UNIT capsule Take 400 Units by mouth daily.   Yes Historical Provider, MD    Family History Family History  Problem Relation Age of Onset  . Heart disease Mother   . Diabetes Mother   . Heart attack Father   . Diabetes Brother   . Colon cancer Neg Hx   . Stomach cancer Neg Hx   . Esophageal cancer Neg Hx   . Rectal cancer Neg Hx   . Liver cancer Neg Hx     Social History Social History  Substance Use Topics  . Smoking status: Former Smoker    Packs/day: 1.00    Years: 35.00    Types: Cigarettes    Quit date: 10/15/2014  . Smokeless tobacco: Never Used     Comment: pack last about 2-3wks  . Alcohol use No     Allergies   Aspirin   Review of Systems Review of Systems  Constitutional: Negative for fever.  HENT: Negative for rhinorrhea.   Respiratory: Positive for shortness of breath. Negative for cough, hemoptysis, sputum production and wheezing.   Cardiovascular: Positive for chest pain. Negative for leg swelling.   Ten systems are  reviewed and are negative for acute change except as noted in the HPI   Physical Exam Updated Vital Signs BP 110/59 (BP Location: Right Arm)   Pulse 77   Temp 97.9 F (36.6 C) (Oral)   Resp 18   Ht 5\' 9"  (1.753 m)   Wt 261 lb (118.4 kg)   SpO2 95%   BMI 38.54 kg/m   Physical Exam  Constitutional: He is oriented to person, place, and time. He appears well-developed and well-nourished. No distress.  HENT:  Head: Normocephalic and atraumatic.  Nose: Nose normal.  Eyes: Conjunctivae and EOM are normal. Pupils are equal, round, and reactive to light. Right eye exhibits no discharge. Left eye exhibits no discharge. No scleral icterus.  Neck: Normal range of motion. Neck supple.  Cardiovascular: Normal rate and regular rhythm.  Exam reveals no gallop and no friction rub.   No murmur heard. Pulmonary/Chest: Effort normal and breath sounds normal. No stridor. No respiratory distress. He has no rales.  Abdominal: Soft. He exhibits no distension. There is no tenderness.  Musculoskeletal: He exhibits no edema or tenderness.  Mild BLE edema  Neurological: He is alert and oriented to person, place, and time.  Skin: Skin is warm and dry. No rash noted. He is not diaphoretic. No erythema.  Psychiatric: He has a normal mood and affect.  Vitals reviewed.    ED Treatments / Results  Labs (all labs ordered are listed, but only abnormal results are displayed) Labs Reviewed  CBC - Abnormal; Notable for the following:       Result Value   RBC 4.02 (*)    Hemoglobin 10.5 (*)    HCT 33.7 (*)    RDW 18.4 (*)    All other components within normal limits  COMPREHENSIVE METABOLIC PANEL - Abnormal; Notable for the following:    Potassium 2.6 (*)    Glucose, Bld 120 (*)    Calcium 8.4 (*)    Total Protein 6.4 (*)    Albumin 3.1 (*)    ALT 11 (*)    GFR calc non Af Amer 58 (*)    All other components within normal limits  I-STAT  TROPOININ, ED    EKG  EKG Interpretation None     MUSE  not crossing over.   EKG: Normal sinus rhythm, intervals, axis. No evidence of acute ischemia, arrhythmias, or blocks.   Radiology Dg Chest 2 View  Result Date: 09/11/2016 CLINICAL DATA:  Patient reports SOB since yesterday. Pt has no other chest complaints. PT Hx: diabetes, HTN, pneumonia, ex smoker 2016 EXAM: CHEST  2 VIEW COMPARISON:  05/20/2016 FINDINGS: The heart is enlarged. There is elevation of right hemidiaphragm, stable in appearance. There are no focal consolidations or pleural effusions. No pulmonary edema. IMPRESSION: Stable cardiomegaly.  No evidence for acute pulmonary abnormality. Electronically Signed   By: Norva Pavlov M.D.   On: 09/11/2016 08:54   Ct Angio Chest Pe W Or Wo Contrast  Result Date: 09/11/2016 CLINICAL DATA:  Chest pain. EXAM: CT ANGIOGRAPHY CHEST WITH CONTRAST TECHNIQUE: Multidetector CT imaging of the chest was performed using the standard protocol during bolus administration of intravenous contrast. Multiplanar CT image reconstructions and MIPs were obtained to evaluate the vascular anatomy. CONTRAST:  100 mL of Isovue 370 intravenously. COMPARISON:  CT scan of May 20, 2016. FINDINGS: Cardiovascular: Satisfactory opacification of the pulmonary arteries to the segmental level. No evidence of pulmonary embolism. Normal heart size. No pericardial effusion. Atherosclerosis of thoracic aorta is noted without aneurysm. Mediastinum/Nodes: No enlarged mediastinal, hilar, or axillary lymph nodes. Thyroid gland, trachea, and esophagus demonstrate no significant findings. Lungs/Pleura: Minimal right apical scarring is noted. No pneumothorax or pleural effusion is noted. 5 mm nodule is noted in left lung apex. Upper Abdomen: No acute abnormality. Musculoskeletal: No chest wall abnormality. No acute or significant osseous findings. Review of the MIP images confirms the above findings. IMPRESSION: No definite evidence of pulmonary embolus. Aortic atherosclerosis. 5 mm left  apical nodule is noted. No follow-up needed if patient is low-risk. Non-contrast chest CT can be considered in 12 months if patient is high-risk. This recommendation follows the consensus statement: Guidelines for Management of Incidental Pulmonary Nodules Detected on CT Images: From the Fleischner Society 2017; Radiology 2017; 284:228-243. Electronically Signed   By: Lupita Raider, M.D.   On: 09/11/2016 10:44    Procedures Procedures (including critical care time)  Medications Ordered in ED Medications  iopamidol (ISOVUE-370) 76 % injection (not administered)  potassium chloride 30 mEq in sodium chloride 0.9 % 265 mL (KCL MULTIRUN) IVPB (30 mEq Intravenous Given 09/11/16 1021)  nitroGLYCERIN (NITROGLYN) 2 % ointment 1 inch (1 inch Topical Given 09/11/16 0905)  iopamidol (ISOVUE-370) 76 % injection 100 mL (100 mLs Intravenous Contrast Given 09/11/16 1003)     Initial Impression / Assessment and Plan / ED Course  I have reviewed the triage vital signs and the nursing notes.  Pertinent labs & imaging results that were available during my care of the patient were reviewed by me and considered in my medical decision making (see chart for details).     1. CP with SOB Atypical chest pain. EKG without acute ischemic changes or evidence of pericarditis. Troponin more than 18 hours after onset of chest pain negative. Feel this is appropriate to rule out ACS. Patient with a history of PEs diagnosed in November currently on Xarelto; states this feels like his prior pulmonary emboli. CTA without evidence of pulmonary embolism, pneumonia, pneumothorax, pleural effusions. Presentation is classic for aortic dissection or esophageal perforation.  She does have a history of dysphasia requiring esophageal dilatation. No recent dilatation.  2. Dysphasia Family reports that the patient's  by mouth intake has significant decline over the past several months and has had 40 pound weight loss and approximately 3  months. They report that he has not been able to tolerate solids for a while however recently has progressed to not been able to tolerate liquids.  Labs with hypokalemia and hypoproteinemia and hypo-albuminemia.  Concern for failure to thrive.  Admitted to hospitalist for further workup and management of the failure to thrive.  Final Clinical Impressions(s) / ED Diagnoses   Final diagnoses:  Shortness of breath  Nonspecific chest pain  Dysphagia, unspecified type     Nira Conn, MD 09/11/16 1141

## 2016-09-11 NOTE — ED Notes (Signed)
DELAY IN BLOOD WORK PT IN RESTROOM 

## 2016-09-11 NOTE — ED Notes (Signed)
Hospitalist at bedside 

## 2016-09-11 NOTE — ED Notes (Signed)
Patient transported to CT 

## 2016-09-11 NOTE — ED Triage Notes (Signed)
Patient reports difficulty breathing starting yesterday. Denies cough. Patient has hx of pulmonary embolism.

## 2016-09-11 NOTE — ED Notes (Signed)
Patient transported to X-ray 

## 2016-09-11 NOTE — Progress Notes (Signed)
CRITICAL VALUE ALERT  Critical value received:  Troponin 0.03  Date of notification:  09/11/16  Time of notification:  2100  Critical value read back:Yes.    Nurse who received alert:  Rennie Plowman  MD notified (1st page):  Schorr, NP  Time of first page:  2102

## 2016-09-12 DIAGNOSIS — R1310 Dysphagia, unspecified: Secondary | ICD-10-CM

## 2016-09-12 DIAGNOSIS — R0789 Other chest pain: Secondary | ICD-10-CM | POA: Diagnosis not present

## 2016-09-12 DIAGNOSIS — R079 Chest pain, unspecified: Secondary | ICD-10-CM | POA: Diagnosis not present

## 2016-09-12 LAB — COMPREHENSIVE METABOLIC PANEL
ALBUMIN: 2.9 g/dL — AB (ref 3.5–5.0)
ALK PHOS: 53 U/L (ref 38–126)
ALT: 9 U/L — AB (ref 17–63)
ANION GAP: 8 (ref 5–15)
AST: 17 U/L (ref 15–41)
BUN: 6 mg/dL (ref 6–20)
CALCIUM: 8.4 mg/dL — AB (ref 8.9–10.3)
CHLORIDE: 113 mmol/L — AB (ref 101–111)
CO2: 25 mmol/L (ref 22–32)
Creatinine, Ser: 0.91 mg/dL (ref 0.61–1.24)
GFR calc non Af Amer: >60 mL/min (ref >60)
Glucose, Bld: 104 mg/dL — ABNORMAL HIGH (ref 65–99)
Potassium: 3.2 mmol/L — ABNORMAL LOW (ref 3.5–5.1)
SODIUM: 146 mmol/L — AB (ref 135–145)
Total Bilirubin: 0.7 mg/dL (ref 0.3–1.2)
Total Protein: 6.4 g/dL — ABNORMAL LOW (ref 6.5–8.1)

## 2016-09-12 LAB — TROPONIN I: Troponin I: <0.03 ng/mL (ref <0.03)

## 2016-09-12 LAB — GLUCOSE, CAPILLARY
GLUCOSE-CAPILLARY: 95 mg/dL (ref 65–99)
GLUCOSE-CAPILLARY: 94 mg/dL (ref 65–99)
GLUCOSE-CAPILLARY: 89 mg/dL (ref 65–99)
GLUCOSE-CAPILLARY: 110 mg/dL — AB (ref 65–99)
Glucose-Capillary: 92 mg/dL (ref 65–99)
Glucose-Capillary: 93 mg/dL (ref 65–99)

## 2016-09-12 LAB — HEMOGLOBIN A1C
HEMOGLOBIN A1C: 6.2 % — AB (ref 4.8–5.6)
MEAN PLASMA GLUCOSE: 131 mg/dL

## 2016-09-12 MED ORDER — TRAZODONE HCL 50 MG PO TABS
100.0000 mg | ORAL_TABLET | Freq: Every day | ORAL | Status: DC
  Administered 2016-09-12 – 2016-09-16 (×5): 100 mg via ORAL
  Filled 2016-09-12 (×5): qty 2

## 2016-09-12 MED ORDER — SODIUM CHLORIDE 0.9 % IV SOLN
INTRAVENOUS | Status: DC
  Administered 2016-09-12 (×2): via INTRAVENOUS
  Filled 2016-09-12 (×5): qty 1000

## 2016-09-12 MED ORDER — POTASSIUM CHLORIDE 2 MEQ/ML IV SOLN
30.0000 meq | Freq: Once | INTRAVENOUS | Status: AC
  Administered 2016-09-12: 30 meq via INTRAVENOUS
  Filled 2016-09-12: qty 15

## 2016-09-12 NOTE — Progress Notes (Signed)
On admission found to have hypokalemia 2. LFTs normal range Point of care troponin negative however troponin 0.03 and this stayed stable and admitted for chest pain rule out  Chest pain rule out-troponin trend is flat. EKG from this morning Feel this is more related to dysphagia-has been told in the past not a good candidate for repeat dilatation-may benefit from manometry? Will need outpatient GI follow-up--we'll ask speech therapy to see to facilitate diet  Hypokalemia replacing IV and with runs of potassium. Repeat labs in a.m., check magnesium  Diabetes mellitus discontinue Amaryl 4 mg twice a day results leading scale only and monitor-blood sugars ranging from 90-100  History of submassive PE and DVT in the past-continue Xarelto  ? Dementia-monitor and assess   PROGRESS NOTE    Andrew Winters  WPY:[id number] DOB: December 09, 1940 DOA: 09/11/2016 PCP: Crist Fat, MD  Outpatient Specialists:   [facility] Dr. Dede Query Dr. Rhea Belton  Brief Narrative:  76 year old male Prior postop LUE DVT subsequent to total knee arthroplasty 6.28 2016-stay@ [facility] rehabilitation after  massive pulmonary embolus 05/2016 Severe dementia Reflux-has had endoscopies 96, 01, O2 3, 06, 12-noted on last episode to prebyesophagus with spasm of lower splinchter--he has been told in the past he would not benefit from repeat dilatation and was placed on dysphagia diet and recommended Reglan  IBD mellitus type 2 Obesity HTN   Assessment & Plan:   Principal Problem:   Chest pain   On admission found to have hypokalemia 2. LFTs normal range Point of care troponin negative however troponin 0.03 and this stayed stable and admitted for chest pain rule out  Chest pain rule out-troponin trend is flat. EKG from this morning benign--unlikely ACS Feel this is more related to dysphagia-has been told in the past not a good candidate for repeat dilatation-may benefit from manometry? appreciate GI input for ?  Manometry in-patient upgraded as per GI  Appreciate SLP  Hypokalemia replacing IV with saline and with runs of potassium. Repeat labs in a.m., check magnesium  Diabetes mellitus discontinue Amaryl 4 mg twice a day results leading scale only and monitor-blood sugars ranging from 90-100  History of submassive PE and DVT in the past-continue Xarelto  ? Dementia-monitor and assess--is a little confused this am   DVT prophylaxis: NOACs Code Status: FUll Family Communication: called wife at home/Cell-no answer.  Called son and updated Disposition Plan: ?   Consultants:   GI  Procedures:   ECHO pending  Antimicrobials:   none    Subjective: Fair a little confused Pressure in chest No radiation   Objective: Vitals:   09/11/16 2142 09/11/16 2254 09/12/16 0428 09/12/16 1122  BP: (!) 104/55 (!) 139/54 (!) 136/56 (!) 150/55  Pulse: (!) 42  (!) 47 (!) 49  Resp: 18  18   Temp: 98.9 F (37.2 C)  99.4 F (37.4 C)   TempSrc: Oral  Oral   SpO2: 96%  98%   Weight:      Height:        Intake/Output Summary (Last 24 hours) at 09/12/16 1356 Last data filed at 09/12/16 0600  Gross per 24 hour  Intake          1443.75 ml  Output              750 ml  Net           693.75 ml   Filed Weights   09/11/16 0801 09/11/16 1711  Weight: 118.4 kg (261 lb) 116 kg (  255 lb 11.7 oz)    Examination:  General exam: Appears calm and comfortable  Respiratory system: Clear to auscultation. Respiratory effort normal. Cardiovascular system: S1 & S2 heard, RRR. No JVD,, M/R/G- No pedal edema. Gastrointestinal system: Abdomen is nondistended, soft and nontender. No organomegaly or masses felt. Normal bowel sounds heard. Central nervous system: Alert and oriented. No focal neurological deficits. Extremities: Symmetric 5 x 5 power. Skin: No rashes, lesions or ulcers Psychiatry: Judgement and insight appear normal. Mood & affect appropriate.     Data Reviewed: I have personally  reviewed following labs and imaging studies  CBC:  Recent Labs Lab 09/11/16 0856  WBC 9.7  HGB 10.5*  HCT 33.7*  MCV 83.8  PLT 179   Basic Metabolic Panel:  Recent Labs Lab 09/11/16 0856 09/11/16 2021 09/12/16 0715  NA 144 145 146*  K 2.6* 2.8* 3.2*  CL 108 114* 113*  CO2 28 25 25   GLUCOSE 120* 93 104*  BUN 8 7 6   CREATININE 1.19 1.00 0.91  CALCIUM 8.4* 8.4* 8.4*   GFR: Estimated Creatinine Clearance: 88.8 mL/min (by C-G formula based on SCr of 0.91 mg/dL). Liver Function Tests:  Recent Labs Lab 09/11/16 0856 09/12/16 0715  AST 15 17  ALT 11* 9*  ALKPHOS 53 53  BILITOT 0.7 0.7  PROT 6.4* 6.4*  ALBUMIN 3.1* 2.9*   No results for input(s): LIPASE, AMYLASE in the last 168 hours. No results for input(s): AMMONIA in the last 168 hours. Coagulation Profile: No results for input(s): INR, PROTIME in the last 168 hours. Cardiac Enzymes:  Recent Labs Lab 09/11/16 2021 09/11/16 2259 09/12/16 0715  TROPONINI 0.03* 0.03* <0.03   BNP (last 3 results) No results for input(s): PROBNP in the last 8760 hours. HbA1C: No results for input(s): HGBA1C in the last 72 hours. CBG:  Recent Labs Lab 09/11/16 2212 09/12/16 0025 09/12/16 0426 09/12/16 1034 09/12/16 1219  GLUCAP 101* 93 95 94 89   Lipid Profile: No results for input(s): CHOL, HDL, LDLCALC, TRIG, CHOLHDL, LDLDIRECT in the last 72 hours. Thyroid Function Tests:  Recent Labs  09/11/16 2021  TSH 0.525   Anemia Panel: No results for input(s): VITAMINB12, FOLATE, FERRITIN, TIBC, IRON, RETICCTPCT in the last 72 hours. Urine analysis:    Component Value Date/Time   COLORURINE YELLOW 03/20/2016 2139   APPEARANCEUR CLEAR 03/20/2016 2139   LABSPEC 1.017 03/20/2016 2139   PHURINE 7.5 03/20/2016 2139   GLUCOSEU NEGATIVE 03/20/2016 2139   HGBUR NEGATIVE 03/20/2016 2139   BILIRUBINUR NEGATIVE 03/20/2016 2139   BILIRUBINUR negative 10/08/2015 1925   KETONESUR NEGATIVE 03/20/2016 2139   PROTEINUR  NEGATIVE 03/20/2016 2139   UROBILINOGEN 1.0 10/08/2015 1925   UROBILINOGEN 0.2 01/07/2015 2223   NITRITE NEGATIVE 03/20/2016 2139   LEUKOCYTESUR NEGATIVE 03/20/2016 2139   Sepsis Labs: @LABRCNTIP (procalcitonin:4,lacticidven:4)  )No results found for this or any previous visit (from the past 240 hour(s)).       Radiology Studies: Dg Chest 2 View  Result Date: 09/11/2016 CLINICAL DATA:  Patient reports SOB since yesterday. Pt has no other chest complaints. PT Hx: diabetes, HTN, pneumonia, ex smoker 2016 EXAM: CHEST  2 VIEW COMPARISON:  05/20/2016 FINDINGS: The heart is enlarged. There is elevation of right hemidiaphragm, stable in appearance. There are no focal consolidations or pleural effusions. No pulmonary edema. IMPRESSION: Stable cardiomegaly.  No evidence for acute pulmonary abnormality. Electronically Signed   By: 11/11/2016 M.D.   On: 09/11/2016 08:54   Ct Angio Chest Pe W Or  Wo Contrast  Result Date: 09/11/2016 CLINICAL DATA:  Chest pain. EXAM: CT ANGIOGRAPHY CHEST WITH CONTRAST TECHNIQUE: Multidetector CT imaging of the chest was performed using the standard protocol during bolus administration of intravenous contrast. Multiplanar CT image reconstructions and MIPs were obtained to evaluate the vascular anatomy. CONTRAST:  100 mL of Isovue 370 intravenously. COMPARISON:  CT scan of May 20, 2016. FINDINGS: Cardiovascular: Satisfactory opacification of the pulmonary arteries to the segmental level. No evidence of pulmonary embolism. Normal heart size. No pericardial effusion. Atherosclerosis of thoracic aorta is noted without aneurysm. Mediastinum/Nodes: No enlarged mediastinal, hilar, or axillary lymph nodes. Thyroid gland, trachea, and esophagus demonstrate no significant findings. Lungs/Pleura: Minimal right apical scarring is noted. No pneumothorax or pleural effusion is noted. 5 mm nodule is noted in left lung apex. Upper Abdomen: No acute abnormality. Musculoskeletal: No  chest wall abnormality. No acute or significant osseous findings. Review of the MIP images confirms the above findings. IMPRESSION: No definite evidence of pulmonary embolus. Aortic atherosclerosis. 5 mm left apical nodule is noted. No follow-up needed if patient is low-risk. Non-contrast chest CT can be considered in 12 months if patient is high-risk. This recommendation follows the consensus statement: Guidelines for Management of Incidental Pulmonary Nodules Detected on CT Images: From the Fleischner Society 2017; Radiology 2017; 284:228-243. Electronically Signed   By: Lupita Raider, M.D.   On: 09/11/2016 10:44        Scheduled Meds: . amLODipine  5 mg Oral Daily  . atorvastatin  40 mg Oral QHS  . cholecalciferol  1,000 Units Oral BID  . famotidine  20 mg Oral Daily  . ferrous sulfate  325 mg Oral Q breakfast  . furosemide  20 mg Oral Daily  . insulin aspart  0-15 Units Subcutaneous Q4H  . lisinopril  10 mg Oral Daily  . loratadine  10 mg Oral Daily  . metFORMIN  500 mg Oral Q breakfast  . potassium chloride  10 mEq Oral Daily  . potassium chloride (KCL MULTIRUN) 30 mEq in 265 mL IVPB  30 mEq Intravenous Once  . rivaroxaban  20 mg Oral Q supper  . sertraline  25 mg Oral Daily  . sodium chloride flush  3 mL Intravenous Q12H  . traZODone  50 mg Oral QHS  . vitamin E  400 Units Oral Daily   Continuous Infusions: . sodium chloride 0.9 % 1,000 mL with potassium chloride 60 mEq infusion 100 mL/hr at 09/12/16 1126     LOS: 0 days    Time spent: 22    Pleas Koch, MD [facility] ([phone]   If 7PM-7AM, please contact night-coverage [email or web address] Password [email or web address] 09/12/2016, 1:56 PM

## 2016-09-12 NOTE — Consult Note (Signed)
Consultation  Referring Provider: [facility]/Dr SamtiniPrimary Care Physician:  Crist Fat, MD Primary Gastroenterologist:  Dr.Danis  Reason for Consultation:  Dysphagia, Admitted with chest pain  HPI: Andrew Winters is a 76 y.o. male known to Dr. Myrtie Neither who has been undergoing evaluation for probable achalasia. He was admitted through the emergency room yesterday with complaints of chest pain. He is a poor historian but says that he had chest pain off and on for about 3 hours and describes it as a fist-like feeling in his lower chest. He says he did have a sensation of shortness of breath, no diaphoresis. He has not had any chest pain today. He is being ruled out for MI. He had one mildly positive troponin, 2-D echo is pending. Patient has history of recent bilateral pulmonary emboli and is on anticoagulation. Also with history of adult-onset diabetes mellitus, depression, GERD, chronic diarrhea, and rheumatoid arthritis. He tells me that for about 3 days prior to admission he was unable to eat or drink much of anything because it would "come right back up". He said he felt like he was choking. He is not aware of having any food becoming lodged. He feels better today and feels like he could eat. Recent barium swallow done as an outpatient showed marked presbyesophagus with tertiary contractions and retained secretions he was also noted to have a 3 cm stricture in the distal esophagus, smooth. On review per Dr. Annabelle Harman cyst appeared more to be consistent with achalasia. Patient is scheduled for manometry on 09/21/2016. He has had prior EGDs in the past per Dr. Juanda Chance and has not had any evidence of stricture.  Chest x-ray on admission shows stable cardiomegaly, CT and U of the chest done on admission yesterday no evidence for pulmonary emboli, pericardial effusion or other acute abnormality. Esophagus read as normal. Potassium was 2.6 on admission, pre-albumin 12.7.  He has had a  significant weight loss over the past several months.   Past Medical History:  Diagnosis Date  . Acute saddle pulmonary embolism (HCC) 05/20/2016  . Anxiety   . Bilateral swelling of feet   . BPH (benign prostatic hyperplasia)    no meds needed per medical md  . Cataracts, bilateral    immature  . Chronic diarrhea   . Deafness    in left ear   . Depression    takes Zoloft daily  . Diabetes mellitus    takes Actos and Amaryl daily as well as Onglyza  . Dysphagia   . Fatigue   . GERD (gastroesophageal reflux disease)    takes Omeprazole daily  . Headaches, cluster   . Hiatal hernia   . Hyperlipemia    was on meds but hasnt been on anything in about 3months per MD  . Hypertension    takes Amlodipine,HCTZ,and Lisinopril daily  . Hyperthyroidism   . Insomnia    takes Trazodone nightly  . Joint pain   . Joint swelling   . Muscle pain   . Nocturia   . Osteoarthritis   . Peripheral neuropathy (HCC)    takes Gabapentin daily  . Pneumonia 3+ yrs ago  . Presbyesophagus   . Rheumatoid arthritis(714.0)   . Vitamin D deficiency    takes Vit D daily    Past Surgical History:  Procedure Laterality Date  . BACK SURGERY  1998  . COLONOSCOPY WITH ESOPHAGOGASTRODUODENOSCOPY (EGD) AND ESOPHAGEAL DILATION (ED)    . IRRIGATION AND DEBRIDEMENT KNEE Right 01/12/2015  . TOTAL  KNEE ARTHROPLASTY Right 01/06/2015   Procedure: TOTAL KNEE ARTHROPLASTY;  Surgeon: Marcene Corning, MD;  Location: [facility] OR;  Service: Orthopedics;  Laterality: Right;    Prior to Admission medications   Medication Sig Start Date End Date Taking? Authorizing Provider  acetaminophen (TYLENOL) 500 MG tablet Take 1,000 mg by mouth every 6 (six) hours as needed for mild pain, moderate pain or headache.    Yes Historical Provider, MD  amLODipine (NORVASC) 5 MG tablet Take 5 mg by mouth daily.   Yes Historical Provider, MD  atorvastatin (LIPITOR) 40 MG tablet Take 40 mg by mouth at bedtime.    Yes Historical Provider, MD    cetirizine (ZYRTEC) 10 MG tablet Take 10 mg by mouth daily.   Yes Historical Provider, MD  cholecalciferol (VITAMIN D) 1000 units tablet Take 1,000 Units by mouth 2 (two) times daily.   Yes Historical Provider, MD  docusate sodium (COLACE) 100 MG capsule Take 100 mg by mouth 2 (two) times daily as needed for mild constipation.   Yes Historical Provider, MD  ferrous sulfate 325 (65 FE) MG tablet Take 325 mg by mouth daily with breakfast.   Yes Historical Provider, MD  furosemide (LASIX) 20 MG tablet Take 20 mg by mouth daily.   Yes Historical Provider, MD  gabapentin (NEURONTIN) 100 MG capsule Take 100 mg by mouth 3 (three) times daily.     Yes Historical Provider, MD  glimepiride (AMARYL) 4 MG tablet Take 4 mg by mouth 2 (two) times daily.    Yes Historical Provider, MD  guaifenesin (HUMIBID E) 400 MG TABS tablet Take 400 mg by mouth every 6 (six) hours as needed (for cough and congestion).   Yes Historical Provider, MD  lisinopril (PRINIVIL,ZESTRIL) 10 MG tablet Take 10 mg by mouth daily.    Yes Historical Provider, MD  metFORMIN (GLUCOPHAGE-XR) 500 MG 24 hr tablet Take 500 mg by mouth daily. 04/26/16  Yes Historical Provider, MD  omeprazole (PRILOSEC OTC) 20 MG tablet Take 20 mg by mouth 2 (two) times daily.   Yes Historical Provider, MD  potassium chloride (K-DUR,KLOR-CON) 10 MEQ tablet Take 10 mEq by mouth daily.   Yes Historical Provider, MD  ranitidine (ZANTAC) 150 MG tablet Take 150 mg by mouth at bedtime.   Yes Historical Provider, MD  rivaroxaban (XARELTO) 20 MG TABS tablet Take 1 tablet (20 mg total) by mouth daily with supper. Take with food starting 06/15/2016 Patient taking differently: Take 20 mg by mouth daily. Take with food starting 06/15/2016 06/15/16  Yes Campbell Stall, MD  sertraline (ZOLOFT) 25 MG tablet Take 25 mg by mouth daily.    Yes Historical Provider, MD  traMADol (ULTRAM) 50 MG tablet Take 50 mg by mouth every 8 (eight) hours as needed for moderate pain.   Yes Historical  Provider, MD  traZODone (DESYREL) 50 MG tablet Take 50 mg by mouth at bedtime.   Yes Historical Provider, MD  vitamin E 400 UNIT capsule Take 400 Units by mouth daily.   Yes Historical Provider, MD    Current Facility-Administered Medications  Medication Dose Route Frequency Provider Last Rate Last Dose  . acetaminophen (TYLENOL) tablet 1,000 mg  1,000 mg Oral Q6H PRN Jackie Plum, MD      . amLODipine (NORVASC) tablet 5 mg  5 mg Oral Daily Jackie Plum, MD   5 mg at 09/12/16 1119  . atorvastatin (LIPITOR) tablet 40 mg  40 mg Oral QHS Jackie Plum, MD   40 mg at 09/11/16  2255  . bisacodyl (DULCOLAX) suppository 10 mg  10 mg Rectal Daily PRN Jackie Plum, MD      . cholecalciferol (VITAMIN D) tablet 1,000 Units  1,000 Units Oral BID Jackie Plum, MD   1,000 Units at 09/11/16 2254  . docusate sodium (COLACE) capsule 100 mg  100 mg Oral BID PRN Jackie Plum, MD      . famotidine (PEPCID) tablet 20 mg  20 mg Oral Daily Jackie Plum, MD   20 mg at 09/12/16 1122  . ferrous sulfate tablet 325 mg  325 mg Oral Q breakfast Jackie Plum, MD      . furosemide (LASIX) tablet 20 mg  20 mg Oral Daily Jackie Plum, MD      . guaiFENesin tablet 400 mg  400 mg Oral Q6H PRN Jackie Plum, MD      . insulin aspart (novoLOG) injection 0-15 Units  0-15 Units Subcutaneous Q4H Roma Kayser Schorr, NP      . lisinopril (PRINIVIL,ZESTRIL) tablet 10 mg  10 mg Oral Daily Jackie Plum, MD   10 mg at 09/11/16 2255  . loratadine (CLARITIN) tablet 10 mg  10 mg Oral Daily Jackie Plum, MD   10 mg at 09/11/16 2255  . metFORMIN (GLUCOPHAGE-XR) 24 hr tablet 500 mg  500 mg Oral Q breakfast Jackie Plum, MD      . ondansetron [facility]) injection 4 mg  4 mg Intravenous Q6H PRN Jackie Plum, MD   4 mg at 09/11/16 2052  . potassium chloride (K-DUR,KLOR-CON) CR tablet 10 mEq  10 mEq Oral Daily Jackie Plum, MD   10 mEq at 09/11/16 2255  . potassium chloride 30 mEq  in sodium chloride 0.9 % 265 mL (KCL MULTIRUN) IVPB  30 mEq Intravenous Once Rhetta Mura, MD      . rivaroxaban (XARELTO) tablet 20 mg  20 mg Oral Q supper Jackie Plum, MD   20 mg at 09/11/16 1756  . sertraline (ZOLOFT) tablet 25 mg  25 mg Oral Daily Jackie Plum, MD   25 mg at 09/11/16 2255  . sodium chloride 0.9 % 1,000 mL with potassium chloride 60 mEq infusion   Intravenous Continuous Rhetta Mura, MD      . sodium chloride flush (NS) 0.9 % injection 3 mL  3 mL Intravenous Q12H Jackie Plum, MD      . traMADol (ULTRAM) tablet 50 mg  50 mg Oral Q8H PRN Jackie Plum, MD   50 mg at 09/11/16 2255  . traZODone (DESYREL) tablet 50 mg  50 mg Oral QHS Jackie Plum, MD   50 mg at 09/11/16 2255  . vitamin E capsule 400 Units  400 Units Oral Daily Jackie Plum, MD        Allergies as of 09/11/2016 - Review Complete 09/11/2016  Allergen Reaction Noted  . Aspirin Diarrhea 12/30/2010    Family History  Problem Relation Age of Onset  . Heart disease Mother   . Diabetes Mother   . Heart attack Father   . Diabetes Brother   . Colon cancer Neg Hx   . Stomach cancer Neg Hx   . Esophageal cancer Neg Hx   . Rectal cancer Neg Hx   . Liver cancer Neg Hx     Social History   Social History  . Marital status: Married    Spouse name: N/A  . Number of children: 3  . Years of education: N/A   Occupational History  . Retired     Social History Main Topics  .  Smoking status: Former Smoker    Packs/day: 1.00    Years: 35.00    Types: Cigarettes    Quit date: 10/15/2014  . Smokeless tobacco: Never Used     Comment: pack last about 2-3wks  . Alcohol use No  . Drug use: No  . Sexual activity: Not Currently   Other Topics Concern  . Not on file   Social History Narrative  . No narrative on file    Review of Systems: Pertinent positive and negative review of systems were noted in the above HPI section.  All other review of systems was otherwise  negative.  Physical Exam: Vital signs in last 24 hours: Temp:  [98.5 F (36.9 C)-99.4 F (37.4 C)] 99.4 F (37.4 C) (03/05 0428) Pulse Rate:  [42-56] 47 (03/05 0428) Resp:  [15-25] 18 (03/05 0428) BP: (104-160)/(54-104) 160/62 (03/05 1119) SpO2:  [94 %-98 %] 98 % (03/05 0428) Weight:  [255 lb 11.7 oz (116 kg)] 255 lb 11.7 oz (116 kg) (03/04 1711) Last BM Date: 09/11/16 General:   Alert,  Well-developed, somewhat confused elderly African-American male well-nourished,cooperative in NAD. Head:  Normocephalic and atraumatic. Eyes:  Sclera clear, no icterus.   Conjunctiva pink. Ears:  Normal auditory acuity. Nose:  No deformity, discharge,  or lesions. Mouth:  No deformity or lesions.   Neck:  Supple; no masses or thyromegaly. Lungs:  Clear throughout to auscultation.   No wheezes, crackles, or rhonchi. Heart:  Regular rate and rhythm; no murmurs, clicks, rubs,  or gallops. Abdomen:  Soft,nontender, BS active,nonpalp mass or hsm.   Rectal:  Deferred  Msk:  Symmetrical without gross deformities. . Pulses:  Normal pulses noted. Extremities:  Without clubbing or edema. Neurologic:  Alert and  oriented x4;  grossly normal neurologically. Skin:  Intact without significant lesions or rashes.. Psych:  Alert and cooperative. Normal mood and affect. Patient continuously wanting to get out of the chair and go into the living room, also trying to answer the phone with his water cup   Intake/Output from previous day: 03/04 0701 - 03/05 0700 In: 1443.8 [P.O.:20; I.V.:1158.8; IV Piggyback:265] Out: 750 [Urine:750] Intake/Output this shift: No intake/output data recorded.  Lab Results:  Recent Labs  09/11/16 0856  WBC 9.7  HGB 10.5*  HCT 33.7*  PLT 179   BMET  Recent Labs  09/11/16 0856 09/11/16 2021 09/12/16 0715  NA 144 145 146*  K 2.6* 2.8* 3.2*  CL 108 114* 113*  CO2 28 25 25   GLUCOSE 120* 93 104*  BUN 8 7 6   CREATININE 1.19 1.00 0.91  CALCIUM 8.4* 8.4* 8.4*    LFT  Recent Labs  09/12/16 0715  PROT 6.4*  ALBUMIN 2.9*  AST 17  ALT 9*  ALKPHOS 53  BILITOT 0.7   PT/INR No results for input(s): LABPROT, INR in the last 72 hours. Hepatitis Panel No results for input(s): HEPBSAG, HCVAB, HEPAIGM, HEPBIGM in the last 72 hours.   IMPRESSION:   #63 76 year old African-American male admitted yesterday with chest pain-ruling out for acute coronary syndrome 2-D echo pending #2 chronic dysphagia-recent worsening and patient relating that he was unable to eat much of anything prior to admission for 3 days. Question transient food impaction Workup in process for probable achalasia with manometry as next step  #3 recent bilateral pulmonary emboli-on anticoagulation #4 adult-onset diabetes mellitus #5 confusion? New #6 hypokalemia-correcting #7 recent significant weight loss of over 40 pounds-is certainly possible that if he is having severe dysphagia this  may  be etiology #8 malnutrition-pre-albumin 12.7  PLAN: #1 complete cardiac workup #2 full liquid diet #3 once cleared from a cardiac perspective-can consider esophageal manometry to be done as an inpatient so we can move forward with treatment for achalasia once proven We will follow with you     Amy Esterwood  09/12/2016, 11:22 AM

## 2016-09-12 NOTE — Evaluation (Addendum)
Physical Therapy Evaluation Patient Details Name: Andrew Winters MRN: [id number] DOB: [birth year] Today's Date: 09/12/2016   History of Present Illness  Pt is a 76 year old male admitted for chest pain, possible GI etiology, with hx of DVT, DM, HTN, HLD, peripheral neuropathy, dysphagia  Clinical Impression  Pt admitted with above diagnosis. Pt currently with functional limitations due to the deficits listed below (see PT Problem List).  Pt will benefit from skilled PT to increase their independence and safety with mobility to allow discharge to the venue listed below.  Pt requiring min assist at times for steadying and presents with cognitive impairments.  Recommend SNF upon d/c if spouse is unable to assist pt.     Follow Up Recommendations Supervision/Assistance - 24 hour;SNF (SNF if spouse cannot assist at home)    Equipment Recommendations  None recommended by PT    Recommendations for Other Services       Precautions / Restrictions Precautions Precautions: Fall      Mobility  Bed Mobility Overal bed mobility: Needs Assistance Bed Mobility: Supine to Sit     Supine to sit: Supervision     General bed mobility comments: supervision for safety, cues for waiting due to lines  Transfers Overall transfer level: Needs assistance Equipment used: Rolling walker (2 wheeled) Transfers: Sit to/from [employer] Sit to Stand: Min guard Stand pivot transfers: Min assist       General transfer comment: pt performed sit to stands with RW initially and min/guard for safety and verbal cues for technique, pt impulsive and attempting to get up to use bathroom, brought BSC over, pt did not use RW at that time and unsteady requiring min assist  Ambulation/Gait Ambulation/Gait assistance: Min guard Ambulation Distance (Feet): 60 Feet Assistive device: Rolling walker (2 wheeled) Gait Pattern/deviations: Step-through pattern;Decreased stride length     General Gait  Details: verbal cues for safe use of RW, distance to tolerance  Stairs            Wheelchair Mobility    Modified Rankin (Stroke Patients Only)       Balance Overall balance assessment: Needs assistance         Standing balance support: No upper extremity supported Standing balance-Leahy Scale: Poor Standing balance comment: requires min assist for external support without assistive device                             Pertinent Vitals/Pain Pain Assessment: No/denies pain    Home Living Family/patient expects to be discharged to:: Private residence Living Arrangements: Spouse/significant other Available Help at Discharge: Family;Available 24 hours/day Type of Home: House Home Access: Stairs to enter Entrance Stairs-Rails: Right Entrance Stairs-Number of Steps: 2-3 Home Layout: One level Home Equipment: Walker - 2 wheels;Shower seat;Bedside commode;Other (comment);Cane - single point (lift chair)      Prior Function Level of Independence: Needs assistance;Independent with assistive device(s)   Gait / Transfers Assistance Needed: Patient reports using a "stick"  ADL's / Homemaking Assistance Needed: Wife assists patient with bathing and LB dressing "sometimes". Pt stands to shower.  Otherwise independent with ADL's.  Wife manages meal prep and housekeeping.  Comments: all above information from previous admission 3 months ago, pt poor historian, no family present at time of evaluation     Hand Dominance        Extremity/Trunk Assessment        Lower Extremity Assessment Lower Extremity Assessment: Generalized weakness  Communication   Communication: HOH  Cognition Arousal/Alertness: Awake/alert Behavior During Therapy: Impulsive Overall Cognitive Status: No family/caregiver present to determine baseline cognitive functioning                 General Comments: pt confabulating at times, does not follow commands consistently,  requires max safety cues    General Comments      Exercises     Assessment/Plan    PT Assessment Patient needs continued PT services  PT Problem List Decreased strength;Decreased activity tolerance;Decreased balance;Decreased knowledge of use of DME;Decreased cognition;Decreased mobility;Decreased safety awareness       PT Treatment Interventions DME instruction;Gait training;Therapeutic exercise;Therapeutic activities;Functional mobility training;Patient/family education;Balance training    PT Goals (Current goals can be found in the Care Plan section)  Acute Rehab PT Goals PT Goal Formulation: Patient unable to participate in goal setting Time For Goal Achievement: 09/26/16 Potential to Achieve Goals: Good    Frequency Min 3X/week   Barriers to discharge        Co-evaluation               End of Session Equipment Utilized During Treatment: Gait belt Activity Tolerance: Patient tolerated treatment well Patient left: in chair;with call bell/phone within reach;with nursing/sitter in room;with chair alarm set Nurse Communication: Mobility status PT Visit Diagnosis: Difficulty in walking, not elsewhere classified (R26.2)    Functional Assessment Tool Used: Clinical judgement;AM-PAC 6 Clicks Basic Mobility Functional Limitation: Mobility: Walking and moving around Mobility: Walking and Moving Around Current Status [phone]): At least 20 percent but less than 40 percent impaired, limited or restricted Mobility: Walking and Moving Around Goal Status [phone]): At least 1 percent but less than 20 percent impaired, limited or restricted    Time: 1130-1143 PT Time Calculation (min) (ACUTE ONLY): 13 min   Charges:   PT Evaluation $PT Eval Low Complexity: 1 Procedure     PT G Codes:   PT G-Codes **NOT FOR INPATIENT CLASS** Functional Assessment Tool Used: Clinical judgement;AM-PAC 6 Clicks Basic Mobility Functional Limitation: Mobility: Walking and moving  around Mobility: Walking and Moving Around Current Status (P1025): At least 20 percent but less than 40 percent impaired, limited or restricted Mobility: Walking and Moving Around Goal Status [phone]): At least 1 percent but less than 20 percent impaired, limited or restricted     Andrew Winters,Andrew Winters 09/12/2016, 1:07 PM Andrew Winters, PT, DPT 09/12/2016 Pager: [phone]

## 2016-09-12 NOTE — Evaluation (Signed)
Clinical/Bedside Swallow Evaluation Patient Details  Name: Andrew Winters MRN: [id number] Date of Birth: 1941/01/31  Today's Date: 09/12/2016 Time: SLP Start Time (ACUTE ONLY): 1310 SLP Stop Time (ACUTE ONLY): 1330 SLP Time Calculation (min) (ACUTE ONLY): 20 min  Past Medical History:  Past Medical History:  Diagnosis Date  . Acute saddle pulmonary embolism (HCC) 05/20/2016  . Anxiety   . Bilateral swelling of feet   . BPH (benign prostatic hyperplasia)    no meds needed per medical md  . Cataracts, bilateral    immature  . Chronic diarrhea   . Deafness    in left ear   . Depression    takes Zoloft daily  . Diabetes mellitus    takes Actos and Amaryl daily as well as Onglyza  . Dysphagia   . Fatigue   . GERD (gastroesophageal reflux disease)    takes Omeprazole daily  . Headaches, cluster   . Hiatal hernia   . Hyperlipemia    was on meds but hasnt been on anything in about 5months per MD  . Hypertension    takes Amlodipine,HCTZ,and Lisinopril daily  . Hyperthyroidism   . Insomnia    takes Trazodone nightly  . Joint pain   . Joint swelling   . Muscle pain   . Nocturia   . Osteoarthritis   . Peripheral neuropathy (HCC)    takes Gabapentin daily  . Pneumonia 3+ yrs ago  . Presbyesophagus   . Rheumatoid arthritis(714.0)   . Vitamin D deficiency    takes Vit D daily   Past Surgical History:  Past Surgical History:  Procedure Laterality Date  . BACK SURGERY  1998  . COLONOSCOPY WITH ESOPHAGOGASTRODUODENOSCOPY (EGD) AND ESOPHAGEAL DILATION (ED)    . IRRIGATION AND DEBRIDEMENT KNEE Right 01/12/2015  . TOTAL KNEE ARTHROPLASTY Right 01/06/2015   Procedure: TOTAL KNEE ARTHROPLASTY;  Surgeon: Marcene Corning, MD;  Location: [facility] OR;  Service: Orthopedics;  Laterality: Right;   HPI:  76 year old male admitted 09/11/16 with chest pain, possible achalasia. PMH significant for recent bilateral P{E, DM, GERD, RA, esophageal dysphagia, deafness left ear. Pt underwent Barium Swallow  07/30/16 which revealed no aspiration, poor motility with 3cm distal stricture, and recommended pureed solids. GI consulted pt today and changed diet to full liquid. BSE requested due to history of dysphagia, although primarily esophageal   Assessment / Plan / Recommendation Clinical Impression  Pt presents with adequate oral motor strength and function. Missing some dentition. GI consulted pt earlier today, and changed diet to full liquid due to primary esophageal dysphagia. No overt s/s aspiration observed or reported on tested consistencies (thin, puree, full liquid). Educational information left regarding management of esophageal dysmotility, however, this is not a new problem for pt. He is quite confused at this time, which could impair his ability to adhere to suggested strategies. Recommend supervision with po intake due to documented esophageal dysmotility. No further ST intervention is recommended at this time. Please reconsult if needs arise in the future.   SLP Visit Diagnosis: Dysphagia, pharyngoesophageal phase (R13.14)    Aspiration Risk  Mild aspiration risk    Diet Recommendation  (full liquid, defer additional diet recs to GI)   Liquid Administration via: Cup;Straw Medication Administration: Whole meds with liquid (crush large pills due to stricture) Supervision: Full supervision/cueing for compensatory strategies;Patient able to self feed Compensations: Minimize environmental distractions;Slow rate;Small sips/bites Postural Changes: Remain upright for at least 30 minutes after po intake;Seated upright at 90 degrees  Other  Recommendations Oral Care Recommendations: Oral care BID    Prognosis Prognosis for Safe Diet Advancement: Fair Barriers to Reach Goals: Cognitive deficits;Severity of deficits      Swallow Study   General Date of Onset: 09/11/16 HPI: 76 year old male admitted 09/11/16 with chest pain, possible achalasia. PMH significant for recent bilateral P{E, DM,  GERD, RA, esophageal dysphagia, deafness left ear. Pt underwent Barium Swallow 07/30/16 which revealed no aspiration, poor motility with 3cm distal stricture, and recommended pureed solids. GI consulted pt today and changed diet to full liquid. BSE requested due to history of dysphagia, although primarily esophageal Type of Study: Bedside Swallow Evaluation Previous Swallow Assessment: Regular Barium Swallow 08/09/16 - no aspiration, poor motility with distal stricture Diet Prior to this Study: Thin liquids (full liquid) Temperature Spikes Noted: No Respiratory Status: Room air History of Recent Intubation: No Behavior/Cognition: Alert;Cooperative;Confused;Agitated;Requires cueing;Distractible Oral Cavity Assessment: Within Functional Limits Oral Care Completed by SLP: No Oral Cavity - Dentition: Missing dentition Vision: Functional for self-feeding Self-Feeding Abilities: Able to feed self Patient Positioning: Upright in bed Baseline Vocal Quality: Normal Volitional Cough: Cognitively unable to elicit Volitional Swallow: Unable to elicit    Oral/Motor/Sensory Function Overall Oral Motor/Sensory Function: Within functional limits   Ice Chips Ice chips: Not tested   Thin Liquid Thin Liquid: Within functional limits Presentation: Cup;Spoon    Nectar Thick Nectar Thick Liquid: Not tested   Honey Thick Honey Thick Liquid: Not tested   Puree Puree: Within functional limits Presentation: Spoon   Solid      Solid: Not tested Other Comments: pt on full liquid diet due to esophageal dysphagia    Functional Assessment Tool Used: asha noms, clinical judgment, BSE Functional Limitations: Swallowing Swallow Current Status (A8341): At least 20 percent but less than 40 percent impaired, limited or restricted Swallow Goal Status [phone]): At least 20 percent but less than 40 percent impaired, limited or restricted Swallow Discharge Status [phone]): At least 20 percent but less than 40 percent  impaired, limited or restricted   Jenie Parish B. Murvin [facility], CCC-SLP [phone]  Leigh Aurora 09/12/2016,1:40 PM

## 2016-09-12 NOTE — Progress Notes (Signed)
CSW attempted to contact patient's wife Wilkie Zenon [phone]; [phone]), no answer. CSW contacted patient's son Anoop Hemmer [phone]), patient's son reported that he has not been able to get in touch with his mother. Patient's son reported that he will continue trying to get in touch with mother. CSW will continue trying to reach patient's wife.   Celso Sickle, [place] Clinical Social Worker [facility] Cell#: [phone]

## 2016-09-12 NOTE — Evaluation (Signed)
Occupational Therapy Evaluation Patient Details Name: Andrew Winters MRN: [id number] DOB: 1940/09/14 Today's Date: 09/12/2016    History of Present Illness Pt is a 76 year old male admitted for chest pain, possible GI etiology, with hx of DVT, DM, HTN, HLD, peripheral neuropathy, dysphagia   Clinical Impression   Pt admitted with chest pain. Pt currently with functional limitations due to the deficits listed below (see OT Problem List). Pt will benefit from skilled OT to increase their safety and independence with ADL and functional mobility for ADL to facilitate discharge to venue listed below.      Follow Up Recommendations  SNF    Equipment Recommendations  None recommended by OT    Recommendations for Other Services       Precautions / Restrictions Precautions Precautions: Fall      Mobility Bed Mobility Overal bed mobility: Needs Assistance Bed Mobility: Supine to Sit     Supine to sit: Supervision     General bed mobility comments: supervision for safety, cues for waiting due to lines  Transfers Overall transfer level: Needs assistance Equipment used: Rolling walker (2 wheeled) Transfers: Sit to/from [employer] Sit to Stand: Min guard Stand pivot transfers: Min assist       General transfer comment: pt walked to bathroom with min A- hand held A    Balance Overall balance assessment: Needs assistance         Standing balance support: No upper extremity supported Standing balance-Leahy Scale: Poor Standing balance comment: requires min assist for external support without assistive device                            ADL Overall ADL's : Needs assistance/impaired     Grooming: Cueing for safety;Min guard           Upper Body Dressing : Sitting;Minimal assistance   Lower Body Dressing: Sit to/from stand;Cueing for safety;Cueing for sequencing;Moderate assistance   Toilet Transfer: Minimal  assistance;RW;Ambulation;Cueing for sequencing   Toileting- Clothing Manipulation and Hygiene: Minimal assistance;Sit to/from stand;Cueing for safety;Cueing for sequencing       Functional mobility during ADLs: Minimal assistance General ADL Comments: impulsive getting to bathroom.- did not call for A     Vision                Pertinent Vitals/Pain Pain Assessment: No/denies pain     Hand Dominance     Extremity/Trunk Assessment Upper Extremity Assessment Upper Extremity Assessment: Generalized weakness   Lower Extremity Assessment Lower Extremity Assessment: Generalized weakness       Communication Communication Communication: HOH   Cognition Arousal/Alertness: Awake/alert Behavior During Therapy: Impulsive Overall Cognitive Status: No family/caregiver present to determine baseline cognitive functioning                 General Comments: does not follow commands consistently, requires max safety cues   General Comments               Home Living Family/patient expects to be discharged to:: Private residence Living Arrangements: Spouse/significant other Available Help at Discharge: Family;Available 24 hours/day Type of Home: House Home Access: Stairs to enter [employer] of Steps: 2-3 Entrance Stairs-Rails: Right Home Layout: One level     Bathroom Shower/Tub: Producer, television/film/video: Standard     Home Equipment: Environmental consultant - 2 wheels;Shower seat;Bedside commode;Other (comment);Cane - single point (lift chair)  Prior Functioning/Environment Level of Independence: Needs assistance;Independent with assistive device(s)  Gait / Transfers Assistance Needed: Patient reports using a "stick" ADL's / Homemaking Assistance Needed: Wife assists patient with bathing and LB dressing "sometimes". Pt stands to shower.  Otherwise independent with ADL's.  Wife manages meal prep and housekeeping.   Comments: all above information from  previous admission 3 months ago, pt poor historian, no family present at time of evaluation        OT Problem List: Decreased strength;Decreased activity tolerance      OT Treatment/Interventions: Self-care/ADL training;DME and/or AE instruction;Patient/family education    OT Goals(Current goals can be found in the care plan section) Acute Rehab OT Goals Patient Stated Goal: did not state OT Goal Formulation: With patient Time For Goal Achievement: 09/26/16 Potential to Achieve Goals: Good  OT Frequency: Min 2X/week              End of Session Nurse Communication: Mobility status  Activity Tolerance: Patient tolerated treatment well Patient left: in bed;with call bell/phone within reach;with bed alarm set  OT Visit Diagnosis: Muscle weakness (generalized) (M62.81);Unsteadiness on feet (R26.81)                ADL either performed or assessed with clinical judgement  Time: 1425-1435 OT Time Calculation (min): 10 min Charges:  OT General Charges $OT Visit: 1 Procedure OT Evaluation $OT Eval Moderate Complexity: 1 Procedure G-Codes:     Lise Auer, OT [phone]  Einar Crow D 09/12/2016, 3:31 PM

## 2016-09-13 DIAGNOSIS — N390 Urinary tract infection, site not specified: Secondary | ICD-10-CM | POA: Diagnosis not present

## 2016-09-13 DIAGNOSIS — K210 Gastro-esophageal reflux disease with esophagitis: Secondary | ICD-10-CM | POA: Diagnosis not present

## 2016-09-13 DIAGNOSIS — F329 Major depressive disorder, single episode, unspecified: Secondary | ICD-10-CM | POA: Diagnosis present

## 2016-09-13 DIAGNOSIS — I10 Essential (primary) hypertension: Secondary | ICD-10-CM | POA: Diagnosis not present

## 2016-09-13 DIAGNOSIS — K449 Diaphragmatic hernia without obstruction or gangrene: Secondary | ICD-10-CM | POA: Diagnosis present

## 2016-09-13 DIAGNOSIS — K219 Gastro-esophageal reflux disease without esophagitis: Secondary | ICD-10-CM | POA: Diagnosis present

## 2016-09-13 DIAGNOSIS — B961 Klebsiella pneumoniae [K. pneumoniae] as the cause of diseases classified elsewhere: Secondary | ICD-10-CM | POA: Diagnosis not present

## 2016-09-13 DIAGNOSIS — E43 Unspecified severe protein-calorie malnutrition: Secondary | ICD-10-CM | POA: Diagnosis not present

## 2016-09-13 DIAGNOSIS — E785 Hyperlipidemia, unspecified: Secondary | ICD-10-CM | POA: Diagnosis not present

## 2016-09-13 DIAGNOSIS — G44009 Cluster headache syndrome, unspecified, not intractable: Secondary | ICD-10-CM | POA: Diagnosis present

## 2016-09-13 DIAGNOSIS — E669 Obesity, unspecified: Secondary | ICD-10-CM | POA: Diagnosis not present

## 2016-09-13 DIAGNOSIS — R001 Bradycardia, unspecified: Secondary | ICD-10-CM | POA: Diagnosis present

## 2016-09-13 DIAGNOSIS — H9192 Unspecified hearing loss, left ear: Secondary | ICD-10-CM | POA: Diagnosis present

## 2016-09-13 DIAGNOSIS — R079 Chest pain, unspecified: Secondary | ICD-10-CM | POA: Diagnosis not present

## 2016-09-13 DIAGNOSIS — Z7901 Long term (current) use of anticoagulants: Secondary | ICD-10-CM | POA: Diagnosis not present

## 2016-09-13 DIAGNOSIS — R1310 Dysphagia, unspecified: Secondary | ICD-10-CM | POA: Diagnosis not present

## 2016-09-13 DIAGNOSIS — E1142 Type 2 diabetes mellitus with diabetic polyneuropathy: Secondary | ICD-10-CM | POA: Diagnosis not present

## 2016-09-13 DIAGNOSIS — K222 Esophageal obstruction: Secondary | ICD-10-CM | POA: Diagnosis not present

## 2016-09-13 DIAGNOSIS — I119 Hypertensive heart disease without heart failure: Secondary | ICD-10-CM | POA: Diagnosis present

## 2016-09-13 DIAGNOSIS — R0602 Shortness of breath: Secondary | ICD-10-CM | POA: Diagnosis present

## 2016-09-13 DIAGNOSIS — Z86718 Personal history of other venous thrombosis and embolism: Secondary | ICD-10-CM | POA: Diagnosis not present

## 2016-09-13 DIAGNOSIS — E039 Hypothyroidism, unspecified: Secondary | ICD-10-CM | POA: Diagnosis not present

## 2016-09-13 DIAGNOSIS — R1312 Dysphagia, oropharyngeal phase: Secondary | ICD-10-CM | POA: Diagnosis not present

## 2016-09-13 DIAGNOSIS — K529 Noninfective gastroenteritis and colitis, unspecified: Secondary | ICD-10-CM | POA: Diagnosis present

## 2016-09-13 DIAGNOSIS — E559 Vitamin D deficiency, unspecified: Secondary | ICD-10-CM | POA: Diagnosis not present

## 2016-09-13 DIAGNOSIS — R0789 Other chest pain: Secondary | ICD-10-CM | POA: Diagnosis not present

## 2016-09-13 DIAGNOSIS — F0150 Vascular dementia without behavioral disturbance: Secondary | ICD-10-CM | POA: Diagnosis present

## 2016-09-13 DIAGNOSIS — M069 Rheumatoid arthritis, unspecified: Secondary | ICD-10-CM | POA: Diagnosis present

## 2016-09-13 DIAGNOSIS — Z6837 Body mass index (BMI) 37.0-37.9, adult: Secondary | ICD-10-CM | POA: Diagnosis not present

## 2016-09-13 DIAGNOSIS — E1140 Type 2 diabetes mellitus with diabetic neuropathy, unspecified: Secondary | ICD-10-CM | POA: Diagnosis not present

## 2016-09-13 DIAGNOSIS — Z86711 Personal history of pulmonary embolism: Secondary | ICD-10-CM | POA: Diagnosis not present

## 2016-09-13 DIAGNOSIS — E1151 Type 2 diabetes mellitus with diabetic peripheral angiopathy without gangrene: Secondary | ICD-10-CM | POA: Diagnosis not present

## 2016-09-13 DIAGNOSIS — Z7984 Long term (current) use of oral hypoglycemic drugs: Secondary | ICD-10-CM | POA: Diagnosis not present

## 2016-09-13 DIAGNOSIS — K224 Dyskinesia of esophagus: Secondary | ICD-10-CM | POA: Diagnosis present

## 2016-09-13 DIAGNOSIS — D638 Anemia in other chronic diseases classified elsewhere: Secondary | ICD-10-CM | POA: Diagnosis not present

## 2016-09-13 DIAGNOSIS — E0590 Thyrotoxicosis, unspecified without thyrotoxic crisis or storm: Secondary | ICD-10-CM | POA: Diagnosis not present

## 2016-09-13 DIAGNOSIS — M6281 Muscle weakness (generalized): Secondary | ICD-10-CM | POA: Diagnosis not present

## 2016-09-13 DIAGNOSIS — E876 Hypokalemia: Secondary | ICD-10-CM | POA: Diagnosis not present

## 2016-09-13 DIAGNOSIS — R509 Fever, unspecified: Secondary | ICD-10-CM | POA: Diagnosis not present

## 2016-09-13 LAB — MAGNESIUM: MAGNESIUM: 1.9 mg/dL (ref 1.7–2.4)

## 2016-09-13 LAB — GLUCOSE, CAPILLARY
GLUCOSE-CAPILLARY: 107 mg/dL — AB (ref 65–99)
GLUCOSE-CAPILLARY: 129 mg/dL — AB (ref 65–99)
GLUCOSE-CAPILLARY: 136 mg/dL — AB (ref 65–99)
Glucose-Capillary: 107 mg/dL — ABNORMAL HIGH (ref 65–99)
Glucose-Capillary: 102 mg/dL — ABNORMAL HIGH (ref 65–99)
Glucose-Capillary: 91 mg/dL (ref 65–99)
Glucose-Capillary: 120 mg/dL — ABNORMAL HIGH (ref 65–99)

## 2016-09-13 LAB — COMPREHENSIVE METABOLIC PANEL
ALT: 12 U/L — AB (ref 17–63)
AST: 18 U/L (ref 15–41)
Albumin: 3.1 g/dL — ABNORMAL LOW (ref 3.5–5.0)
Alkaline Phosphatase: 49 U/L (ref 38–126)
Anion gap: 7 (ref 5–15)
BUN: <5 mg/dL — ABNORMAL LOW (ref 6–20)
CHLORIDE: 115 mmol/L — AB (ref 101–111)
CO2: 26 mmol/L (ref 22–32)
Calcium: 8.4 mg/dL — ABNORMAL LOW (ref 8.9–10.3)
Creatinine, Ser: 0.81 mg/dL (ref 0.61–1.24)
Glucose, Bld: 110 mg/dL — ABNORMAL HIGH (ref 65–99)
POTASSIUM: 3.4 mmol/L — AB (ref 3.5–5.1)
SODIUM: 148 mmol/L — AB (ref 135–145)
Total Bilirubin: 0.6 mg/dL (ref 0.3–1.2)
Total Protein: 6.3 g/dL — ABNORMAL LOW (ref 6.5–8.1)

## 2016-09-13 MED ORDER — LORAZEPAM 2 MG/ML IJ SOLN
1.0000 mg | Freq: Once | INTRAMUSCULAR | Status: AC
  Administered 2016-09-13: 1 mg via INTRAVENOUS
  Filled 2016-09-13: qty 1

## 2016-09-13 MED ORDER — POTASSIUM CHLORIDE 2 MEQ/ML IV SOLN
INTRAVENOUS | Status: DC
  Administered 2016-09-13 – 2016-09-14 (×2): via INTRAVENOUS
  Filled 2016-09-13 (×6): qty 1000

## 2016-09-13 MED ORDER — AMLODIPINE BESYLATE 10 MG PO TABS
10.0000 mg | ORAL_TABLET | Freq: Every day | ORAL | Status: DC
  Administered 2016-09-13: 10 mg via ORAL
  Filled 2016-09-13 (×2): qty 1

## 2016-09-13 NOTE — Progress Notes (Signed)
Nutrition Brief Note  Patient identified on the Malnutrition Screening Tool (MST) Report  Wt Readings from Last 15 Encounters:  09/11/16 255 lb 11.7 oz (116 kg)  09/06/16 261 lb 4 oz (118.5 kg)  07/20/16 268 lb 6 oz (121.7 kg)  05/23/16 269 lb 6.4 oz (122.2 kg)  10/08/15 (!) 306 lb (138.8 kg)  01/06/15 281 lb (127.5 kg)  12/25/14 281 lb 12.8 oz (127.8 kg)  04/18/13 250 lb (113.4 kg)  06/08/11 247 lb 6.4 oz (112.2 kg)  01/19/11 250 lb (113.4 kg)  01/13/11 252 lb (114.3 kg)  03/25/10 245 lb (111.1 kg)  01/05/10 (!) 260 lb (117.9 kg)  11/05/09 (!) 258 lb (117 kg)  08/05/09 (!) 258 lb (117 kg)    Body mass index is 37.22 kg/m. Patient meets criteria for obesity based on current BMI. Per review pt has lost 6 lbs (2% body weight) in 1 week which is significant for time frame. Weight obtained in the ED and may have been partly d/t possible dehydration given notes indicating pt was not eating or drinking x3 days PTA d/t vomiting with all PO intakes and current labs.   Current diet order is NPO. Pt seen by SLP and cleared for FLD with no s/s of dysphagia during SLP assessment yesterday. Skin WDL. Per Dr. Mahala Menghini during rounds, pt may d/c in 48 hours.   Medications reviewed; 20 mg oral Lasix/day, sliding scale Novolog, 500 mg Glucophage/day, 10 mEq oral KCl/day, 30 mEq IV KCl x1 run yesterday, 400 units vitamin E/day.  Labs reviewed; 148 mmol/L, Cl: 115 mmol/L, K: 3.4 mmol/L, BUN: <5 mg/dL, Ca: 8.4 mg/dL.  IVF: D5-1/2 NS @ 100 mL/hr (408 kcal).  No nutrition interventions warranted at this time. If nutrition issues arise, please consult RD.     Trenton Gammon, MS, RD, LDN, [facility] Inpatient Clinical Dietitian Pager # [phone] After hours/weekend pager # [phone]

## 2016-09-13 NOTE — Progress Notes (Signed)
PROGRESS NOTE    Andrew Winters  SRP:[id number] DOB: Sep 02, 1940 DOA: 09/11/2016 PCP: Crist Fat, MD  Outpatient Specialists:   [facility] Dr. Dede Query Dr. Rhea Belton  Brief Narrative:  76 year old male Prior postop LUE DVT subsequent to total knee arthroplasty 6.28 2016-stay@ [facility] rehabilitation after  massive pulmonary embolus 05/2016 Severe dementia Reflux-has had endoscopies 96, 01, O2 3, 06, 12-noted on last episode to prebyesophagus with spasm of lower splinchter--he has been told in the past he would not benefit from repeat dilatation and was placed on dysphagia diet and recommended Reglan  IBD mellitus type 2 Obesity HTN   Assessment & Plan:   Principal Problem:   Chest pain Active Problems:   Dysphagia   On admission found to have hypokalemia 2. LFTs normal range Point of care troponin negative however troponin 0.03 and this stayed stable and admitted for chest pain rule out   Chest pain rule out-troponin trend is flat. EKG from this morning benign--unlikely ACS Feel this is more related to dysphagia-has been told in the past not a good candidate for repeat dilatation-may benefit from manometry? appreciate GI input for ? Manometry in-patient upgraded diet as per GI  Appreciate SLP He doesn not require from my perspective any further work-up for cardiac etiology of CP  htn-unconmtrolled, increasing amlodipine 5-->10 Lisinopril 10 od Continue to adjust meds as prn  Hypokalemia replacing IV with saline and with runs of potassium. Repeat labs in a.m., check magnesium Changed to d5/0.45 saline as Sodium 148 recheck am  Diabetes mellitus discontinue Amaryl 4 mg twice a day results leading scale only and monitor-blood sugars ranging from 90-100  History of submassive PE and DVT in the past-continue Xarelto  ? Dementia-monitor and assess--is a little confused this admit-needed temproarily some restraints   DVT prophylaxis: NOACs Code Status:  FUll Family Communication: d/w wife at bedside Disposition Plan: ? possibel SNF once EGD done in 48-72 hr   Consultants:   GI  Procedures:   none  Antimicrobials:   none    Subjective: Sleepy this am Some cough per wife. No cp currently Agitated overnight    Objective: Vitals:   09/12/16 1122 09/12/16 1500 09/12/16 2053 09/13/16 0408  BP: (!) 150/55 (!) 142/56 (!) 147/75 (!) 159/95  Pulse: (!) 49 (!) 54 69 65  Resp:  20 18 18   Temp:  98.9 F (37.2 C) 99 F (37.2 C) 98.9 F (37.2 C)  TempSrc:  Oral Oral Oral  SpO2:  97% 98% 98%  Weight:      Height:        Intake/Output Summary (Last 24 hours) at 09/13/16 0903 Last data filed at 09/13/16 0409  Gross per 24 hour  Intake          1636.67 ml  Output             1400 ml  Net           236.67 ml   Filed Weights   09/11/16 0801 09/11/16 1711  Weight: 118.4 kg (261 lb) 116 kg (255 lb 11.7 oz)    Examination:  General exam: slight confusion-needed meds alst pm and breifly restraints Respiratory system: Clear to auscultation. Respiratory effort normal. No wheeze added sound Cardiovascular system: S1 & S2 heard, RRR. No JVD,, M/R/G- No pedal edema. Gastrointestinal system: Abdomen is nondistended, soft  Central nervous system: Alert and oriented. No focal neurological deficits. Extremities: Symmetric 5 x 5 power. Skin: No rashes, lesions or ulcers Psychiatry:  Judgement and insight appear normal. Mood & affect appropriate.     Data Reviewed: I have personally reviewed following labs and imaging studies  CBC:  Recent Labs Lab 09/11/16 0856  WBC 9.7  HGB 10.5*  HCT 33.7*  MCV 83.8  PLT 179   Basic Metabolic Panel:  Recent Labs Lab 09/11/16 0856 09/11/16 2021 09/12/16 0715 09/13/16 0534  NA 144 145 146* 148*  K 2.6* 2.8* 3.2* 3.4*  CL 108 114* 113* 115*  CO2 28 25 25 26   GLUCOSE 120* 93 104* 110*  BUN 8 7 6  <5*  CREATININE 1.19 0.91 0.81  CALCIUM 8.4* 8.4* 8.4* 8.4*  MG  --   --    --  1.9   GFR: Estimated Creatinine Clearance: 99.8 mL/min (by C-G formula based on SCr of 0.81 mg/dL). Liver Function Tests:  Recent Labs Lab 09/11/16 0856 09/12/16 0715 09/13/16 0534  AST 15 17 18   ALT 11* 9* 12*  ALKPHOS 53 53 49  BILITOT 0.7 0.7 0.6  PROT 6.4* 6.4* 6.3*  ALBUMIN 3.1* 2.9* 3.1*   No results for input(s): LIPASE, AMYLASE in the last 168 hours. No results for input(s): AMMONIA in the last 168 hours. Coagulation Profile: No results for input(s): INR, PROTIME in the last 168 hours. Cardiac Enzymes:  Recent Labs Lab 09/11/16 2021 09/11/16 2259 09/12/16 0715  TROPONINI 0.03* 0.03* <0.03   BNP (last 3 results) No results for input(s): PROBNP in the last 8760 hours. HbA1C:  Recent Labs  09/11/16 2021  HGBA1C 6.2*   CBG:  Recent Labs Lab 09/12/16 1823 09/12/16 2040 09/13/16 0111 09/13/16 0400 09/13/16 0728  GLUCAP 110* 92 107* 107* 102*   Lipid Profile: No results for input(s): CHOL, HDL, LDLCALC, TRIG, CHOLHDL, LDLDIRECT in the last 72 hours. Thyroid Function Tests:  Recent Labs  09/11/16 2021  TSH 0.525   Anemia Panel: No results for input(s): VITAMINB12, FOLATE, FERRITIN, TIBC, IRON, RETICCTPCT in the last 72 hours. Urine analysis:    Component Value Date/Time   COLORURINE YELLOW 03/20/2016 2139   APPEARANCEUR CLEAR 03/20/2016 2139   LABSPEC 1.017 03/20/2016 2139   PHURINE 7.5 03/20/2016 2139   GLUCOSEU NEGATIVE 03/20/2016 2139   HGBUR NEGATIVE 03/20/2016 2139   BILIRUBINUR NEGATIVE 03/20/2016 2139   BILIRUBINUR negative 10/08/2015 1925   KETONESUR NEGATIVE 03/20/2016 2139   PROTEINUR NEGATIVE 03/20/2016 2139   UROBILINOGEN 1.0 10/08/2015 1925   UROBILINOGEN 0.2 01/07/2015 2223   NITRITE NEGATIVE 03/20/2016 2139   LEUKOCYTESUR NEGATIVE 03/20/2016 2139   Sepsis Labs: @LABRCNTIP (procalcitonin:4,lacticidven:4)  )No results found for this or any previous visit (from the past 240 hour(s)).       Radiology Studies: Ct  Angio Chest Pe W Or Wo Contrast  Result Date: 09/11/2016 CLINICAL DATA:  Chest pain. EXAM: CT ANGIOGRAPHY CHEST WITH CONTRAST TECHNIQUE: Multidetector CT imaging of the chest was performed using the standard protocol during bolus administration of intravenous contrast. Multiplanar CT image reconstructions and MIPs were obtained to evaluate the vascular anatomy. CONTRAST:  100 mL of Isovue 370 intravenously. COMPARISON:  CT scan of May 20, 2016. FINDINGS: Cardiovascular: Satisfactory opacification of the pulmonary arteries to the segmental level. No evidence of pulmonary embolism. Normal heart size. No pericardial effusion. Atherosclerosis of thoracic aorta is noted without aneurysm. Mediastinum/Nodes: No enlarged mediastinal, hilar, or axillary lymph nodes. Thyroid gland, trachea, and esophagus demonstrate no significant findings. Lungs/Pleura: Minimal right apical scarring is noted. No pneumothorax or pleural effusion is noted. 5 mm nodule is noted in left  lung apex. Upper Abdomen: No acute abnormality. Musculoskeletal: No chest wall abnormality. No acute or significant osseous findings. Review of the MIP images confirms the above findings. IMPRESSION: No definite evidence of pulmonary embolus. Aortic atherosclerosis. 5 mm left apical nodule is noted. No follow-up needed if patient is low-risk. Non-contrast chest CT can be considered in 12 months if patient is high-risk. This recommendation follows the consensus statement: Guidelines for Management of Incidental Pulmonary Nodules Detected on CT Images: From the Fleischner Society 2017; Radiology 2017; 284:228-243. Electronically Signed   By: Lupita Raider, M.D.   On: 09/11/2016 10:44        Scheduled Meds: . amLODipine  5 mg Oral Daily  . atorvastatin  40 mg Oral QHS  . cholecalciferol  1,000 Units Oral BID  . famotidine  20 mg Oral Daily  . ferrous sulfate  325 mg Oral Q breakfast  . furosemide  20 mg Oral Daily  . insulin aspart  0-15 Units  Subcutaneous Q4H  . lisinopril  10 mg Oral Daily  . loratadine  10 mg Oral Daily  . metFORMIN  500 mg Oral Q breakfast  . potassium chloride  10 mEq Oral Daily  . rivaroxaban  20 mg Oral Q supper  . sertraline  25 mg Oral Daily  . sodium chloride flush  3 mL Intravenous Q12H  . traZODone  100 mg Oral QHS  . vitamin E  400 Units Oral Daily   Continuous Infusions: . sodium chloride 0.9 % 1,000 mL with potassium chloride 60 mEq infusion 100 mL/hr at 09/12/16 2309     LOS: 0 days    Time spent: 34    Pleas Koch, MD [facility] (P) [phone]   If 7PM-7AM, please contact night-coverage [email or web address] Password [email or web address] 09/13/2016, 9:03 AM

## 2016-09-13 NOTE — Clinical Social Work Placement (Signed)
   CLINICAL SOCIAL WORK PLACEMENT  NOTE  Date:  09/13/2016  Patient Details  Name: Andrew Winters MRN: [id number] Date of Birth: [birth year]  Clinical Social Work is seeking post-discharge placement for this patient at the Skilled  Nursing Facility level of care (*CSW will initial, date and re-position this form in  chart as items are completed):  Yes   Patient/family provided with [place] Clinical Social Work Department's list of facilities offering this level of care within the geographic area requested by the patient (or if unable, by the patient's family).  Yes   Patient/family informed of their freedom to choose among providers that offer the needed level of care, that participate in Medicare, Medicaid or managed care program needed by the patient, have an available bed and are willing to accept the patient.  Yes   Patient/family informed of [place]'s ownership interest in [facility] and [facility], as well as of the fact that they are under no obligation to receive care at these facilities.  PASRR submitted to EDS on       PASRR number received on       Existing PASRR number confirmed on 09/13/16     FL2 transmitted to all facilities in geographic area requested by pt/family on 09/13/16     FL2 transmitted to all facilities within larger geographic area on       Patient informed that his/her managed care company has contracts with or will negotiate with certain facilities, including the following:            Patient/family informed of bed offers received.  Patient chooses bed at       Physician recommends and patient chooses bed at      Patient to be transferred to   on  .  Patient to be transferred to facility by       Patient family notified on   of transfer.  Name of family member notified:        PHYSICIAN       Additional Comment:    _______________________________________________ Arlyss Repress, LCSW 09/13/2016, 10:59 AM

## 2016-09-13 NOTE — Progress Notes (Signed)
Soft restraint lap belt removed at 0600 when pericare was performed.  Pt A & O X 4. Wife at bedside. Oncoming nurse made aware of Hx.Pt had been in lap belt restraint and mittens since 1600 on 3/5 d/t safety issues.

## 2016-09-13 NOTE — Progress Notes (Signed)
Patient ID: Andrew Winters, male   DOB: 04/27/1941, 75 y.o.   MRN: [id number]    Progress Note   Subjective   Pt having significant difficulty swallowing again today , even pills- goes down but comes back up,has to regurgitate Wife at bedside- has had vey little to eat past several day  No c/o Chest pain    Objective   Vital signs in last 24 hours: Temp:  [98.9 F (37.2 C)-99 F (37.2 C)] 98.9 F (37.2 C) (03/06 0408) Pulse Rate:  [49-69] 65 (03/06 0408) Resp:  [18-20] 18 (03/06 0408) BP: (142-159)/(55-95) 159/95 (03/06 0408) SpO2:  [97 %-98 %] 98 % (03/06 0408) Last BM Date: 09/13/16 General:    Elderly AA male  in NAD Heart:  Regular rate and rhythm; no murmurs Lungs: Respirations even and unlabored, lungs CTA bilaterally Abdomen:  Soft, nontender and nondistended. Normal bowel sounds. Extremities:  Without edema. Neurologic:  Alert and oriented,  grossly normal neurologically. Psych:  Cooperative. Normal mood and affect.  Intake/Output from previous day: 03/05 0701 - 03/06 0700 In: 1636.7 [P.O.:180; I.V.:1456.7] Out: 1400 [Urine:1400] Intake/Output this shift: No intake/output data recorded.  Lab Results:  Recent Labs  09/11/16 0856  WBC 9.7  HGB 10.5*  HCT 33.7*  PLT 179   BMET  Recent Labs  09/11/16 2021 09/12/16 0715 09/13/16 0534  NA 145 146* 148*  K 2.8* 3.2* 3.4*  CL 114* 113* 115*  CO2 25 25 26  GLUCOSE 93 104* 110*  BUN 7 6 <5*  CREATININE 1.00 0.91 0.81  CALCIUM 8.4* 8.4* 8.4*   LFT  Recent Labs  09/13/16 0534  PROT 6.3*  ALBUMIN 3.1*  AST 18  ALT 12*  ALKPHOS 49  BILITOT 0.6   PT/INR No results for input(s): LABPROT, INR in the last 72 hours.  Studies/Results: No results found.     Assessment / Plan:    #1 75 yo AA male with chest pain and dysphagia-  Echo negative Medicine not planning to pursue further cardiac eval  Suspect underlying problem is Achalasia  #2 Hx bilateral PE's 05/2016- remains on Xarelto  #3  AODM #4weight loss/malnutrition (prealb 12)  likely secondary to    Severe dysphagia   Plan; Plan is for diagnostic EGD tomorrow-on Xarelto  And placement of manometry probe at time of EGD   If Manometry consistent with Achalasia- then repeat EGD with BOTOX inpatient  (will need to come off Xarelto ) will be indicated He is to be discharged to a skilled nursing facility on discharge - cannot be discharged until has means of nutritional support     Principal Problem:   Chest pain Active Problems:   Dysphagia     LOS: 0 days   Esther Broyles  09/13/2016, 11:00 AM   

## 2016-09-13 NOTE — [place] FL2 (Signed)
[place] MEDICAID FL2 LEVEL OF CARE SCREENING TOOL     IDENTIFICATION  Patient Name: Andrew Winters Birthdate: 08-24-1940 Sex: male Admission Date (Current Location): 09/11/2016  [facility] and [place] Number:  [facility]   Facility and Address:  [facility] [place] [facility],  [address] [place] [address]      Provider Number: [id number]  Attending Physician Name and Address:  Rhetta Mura, MD  Relative Name and Phone Number:       Current Level of Care: Hospital Recommended Level of Care: Skilled Nursing Facility Prior Approval Number:    Date Approved/Denied:   PASRR Number: [id number]  Discharge Plan: SNF    Current Diagnoses: Patient Active Problem List   Diagnosis Date Noted  . Dysphagia   . Chest pain 09/11/2016  . Pulmonary emboli (HCC) 05/20/2016  . Acute saddle pulmonary embolism without acute cor pulmonale (HCC) 05/20/2016  . Pseudogout of right knee 01/13/2015  . DVT of upper extremity (deep vein thrombosis): LEFT 01/11/2015  . Anemia, iron deficiency 01/11/2015  . Postoperative fever   . Fever 01/08/2015  . Diabetes mellitus type 2, controlled (HCC) 01/07/2015  . Essential hypertension 01/07/2015  . SIRS (systemic inflammatory response syndrome) (HCC) 01/07/2015  . Acute encephalopathy   . Primary osteoarthritis of right knee 01/06/2015  . Diabetes (HCC) 01/06/2015  . Morbid obesity (HCC) 01/06/2015  . DIABETIC PERIPHERAL NEUROPATHY 01/05/2010  . ANKLE PAIN, LEFT 01/05/2010  . HEADACHE 05/07/2009  . UNSPECIFIED VITAMIN D DEFICIENCY 02/03/2009  . LIVER FUNCTION TESTS, ABNORMAL 07/19/2007  . HYPERTHYROIDISM 01/04/2007  . DIABETES MELLITUS, TYPE II 01/04/2007  . HYPERLIPIDEMIA 01/04/2007  . ERECTILE DYSFUNCTION 01/04/2007  . DEPRESSION 01/04/2007  . HYPERTENSION 01/04/2007  . GERD 01/04/2007  . BENIGN PROSTATIC HYPERTROPHY 01/04/2007  . OSTEOARTHRITIS 01/04/2007    Orientation RESPIRATION BLADDER Height & Weight     Self  Normal  Incontinent, External catheter Weight: 255 lb 11.7 oz (116 kg) Height:  5' 9.5" (176.5 cm)  BEHAVIORAL SYMPTOMS/MOOD NEUROLOGICAL BOWEL NUTRITION STATUS      Continent Diet (Full Liquid Diet)  AMBULATORY STATUS COMMUNICATION OF NEEDS Skin   Limited Assist Verbally Normal                       Personal Care Assistance Level of Assistance  Bathing, Dressing Bathing Assistance: Limited assistance   Dressing Assistance: Limited assistance     Functional Limitations Info             SPECIAL CARE FACTORS FREQUENCY  PT (By licensed PT), OT (By licensed OT)     PT Frequency: 5 OT Frequency: 5            Contractures      Additional Factors Info  Code Status, Allergies Code Status Info: Fullcode Allergies Info: Aspirin           Current Medications (09/13/2016):  This is the current hospital active medication list Current Facility-Administered Medications  Medication Dose Route Frequency Provider Last Rate Last Dose  . acetaminophen (TYLENOL) tablet 1,000 mg  1,000 mg Oral Q6H PRN Jackie Plum, MD      . amLODipine (NORVASC) tablet 10 mg  10 mg Oral Daily Rhetta Mura, MD   10 mg at 09/13/16 1031  . atorvastatin (LIPITOR) tablet 40 mg  40 mg Oral QHS Jackie Plum, MD   40 mg at 09/12/16 2037  . bisacodyl (DULCOLAX) suppository 10 mg  10 mg Rectal Daily PRN Jackie Plum, MD      .  cholecalciferol (VITAMIN D) tablet 1,000 Units  1,000 Units Oral BID Jackie Plum, MD   1,000 Units at 09/13/16 1031  . dextrose 5 % and 0.45% NaCl 1,000 mL with potassium chloride 60 mEq infusion   Intravenous Continuous Rhetta Mura, MD      . docusate sodium (COLACE) capsule 100 mg  100 mg Oral BID PRN Jackie Plum, MD      . famotidine (PEPCID) tablet 20 mg  20 mg Oral Daily Jackie Plum, MD   20 mg at 09/13/16 1031  . ferrous sulfate tablet 325 mg  325 mg Oral Q breakfast Jackie Plum, MD   325 mg at 09/13/16 1030  . furosemide (LASIX)  tablet 20 mg  20 mg Oral Daily Jackie Plum, MD   20 mg at 09/13/16 1031  . guaiFENesin tablet 400 mg  400 mg Oral Q6H PRN Jackie Plum, MD      . insulin aspart (novoLOG) injection 0-15 Units  0-15 Units Subcutaneous Q4H Roma Kayser Schorr, NP      . lisinopril (PRINIVIL,ZESTRIL) tablet 10 mg  10 mg Oral Daily Jackie Plum, MD   10 mg at 09/13/16 1031  . loratadine (CLARITIN) tablet 10 mg  10 mg Oral Daily Jackie Plum, MD   10 mg at 09/13/16 1031  . metFORMIN (GLUCOPHAGE-XR) 24 hr tablet 500 mg  500 mg Oral Q breakfast Jackie Plum, MD   500 mg at 09/13/16 1031  . ondansetron (ZOFRAN) injection 4 mg  4 mg Intravenous Q6H PRN Jackie Plum, MD   4 mg at 09/11/16 2052  . potassium chloride (K-DUR,KLOR-CON) CR tablet 10 mEq  10 mEq Oral Daily Jackie Plum, MD   10 mEq at 09/13/16 1031  . rivaroxaban (XARELTO) tablet 20 mg  20 mg Oral Q supper Jackie Plum, MD   20 mg at 09/12/16 1901  . sertraline (ZOLOFT) tablet 25 mg  25 mg Oral Daily Jackie Plum, MD   25 mg at 09/13/16 1031  . sodium chloride flush (NS) 0.9 % injection 3 mL  3 mL Intravenous Q12H Jackie Plum, MD   3 mL at 09/12/16 1134  . traMADol (ULTRAM) tablet 50 mg  50 mg Oral Q8H PRN Jackie Plum, MD   50 mg at 09/11/16 2255  . traZODone (DESYREL) tablet 100 mg  100 mg Oral QHS Rhetta Mura, MD   100 mg at 09/12/16 2037  . vitamin E capsule 400 Units  400 Units Oral Daily Jackie Plum, MD   400 Units at 09/13/16 1031     Discharge Medications: Please see discharge summary for a list of discharge medications.  Relevant Imaging Results:  Relevant Lab Results:   Additional Information SSN:  [id number]  Arlyss Repress, LCSW

## 2016-09-13 NOTE — Clinical Social Work Note (Signed)
Clinical Social Work Assessment  Patient Details  Name: Andrew Winters MRN: [id number] Date of Birth: [birth year]  Date of referral:  09/13/16               Reason for consult:  Facility Placement                Permission sought to share information with:  Oceanographer granted to share information::  Yes, Verbal Permission Granted  Name::        Agency::     Relationship::     Contact Information:     Housing/Transportation Living arrangements for the past 2 months:  Single Family Home Source of Information:  Spouse Patient Interpreter Needed:  None Criminal Activity/Legal Involvement Pertinent to Current Situation/Hospitalization:  No - Comment as needed Significant Relationships:  Spouse, Adult Children Lives with:  Spouse Do you feel safe going back to the place where you live?  No Need for family participation in patient care:  Yes (Comment)  Care giving concerns:  Patient resides in home with wife. Patient's wife reported that patient is taking blood thinners and has concerns of patient falling. Patient's wife reports that caring for patient solely has become too much.    Social Worker assessment / plan: CSW spoke with patient's wife at bedside. Patient alert and oriented to person. Patient's wife reported that patient went to SNF previously after having knee replacement, noting patient went to [facility]. Patient's wife reported that she has back problems and its becoming harder to care for patient at home. Patient's wife reported that she wanted patient to go to SNF for rehab to get stronger and return home.  Patient and patient's wife reside in [facility] and requested SNF in [place]. Fl2 completed, CSW will follow up with patient's preferable SNF's and follow patient for discharge planning.  Employment status:  Retired Database administrator PT Recommendations:  Skilled Nursing Facility Information / Referral to  community resources:  Skilled Nursing Facility  Patient/Family's Response to care: Patient's wife agreeable to patient going to SNF.   Patient/Family's Understanding of and Emotional Response to Diagnosis, Current Treatment, and Prognosis:   Patient's wife verbalized understanding of patient's current treatment and prognosis. Patient's wife reports that she is stressed out and that she has just been dealing with everything. Patient's wife hopeful that patient will be able to return home after rehab at [facility]. Patient alert and oriented to person during assessment. Patient's wife began to speak about their children and patient began to laugh. Patient appeared to be happy aeb by smile.    Emotional Assessment Appearance:  Appears stated age Attitude/Demeanor/Rapport:  Unable to Assess Affect (typically observed):  Happy Orientation:    Alcohol / Substance use:  Not Applicable Psych involvement (Current and /or in the community):     Discharge Needs  Concerns to be addressed:  No discharge needs identified Readmission within the last 30 days:  No Current discharge risk:  None Barriers to Discharge:  No Barriers Identified   Antionette Poles, LCSW 09/13/2016, 12:31 PM

## 2016-09-13 NOTE — Clinical Social Work Placement (Signed)
Patient received and accepted bed offer at [facility]. Staff from [facility] notified. CSW will continue to follow patient for discharge planning.  CLINICAL SOCIAL WORK PLACEMENT  NOTE  Date:  09/13/2016  Patient Details  Name: Andrew Winters MRN: [id number] Date of Birth: March 11, 1941  Clinical Social Work is seeking post-discharge placement for this patient at the Skilled  Nursing Facility level of care (*CSW will initial, date and re-position this form in  chart as items are completed):  Yes   Patient/family provided with [facility] Clinical Social Work Department's list of facilities offering this level of care within the geographic area requested by the patient (or if unable, by the patient's family).  Yes   Patient/family informed of their freedom to choose among providers that offer the needed level of care, that participate in Medicare, Medicaid or managed care program needed by the patient, have an available bed and are willing to accept the patient.  Yes   Patient/family informed of [facility]'s ownership interest in [facility] and [facility], as well as of the fact that they are under no obligation to receive care at these facilities.  PASRR submitted to EDS on       PASRR number received on       Existing PASRR number confirmed on 09/13/16     FL2 transmitted to all facilities in geographic area requested by pt/family on 09/13/16     FL2 transmitted to all facilities within larger geographic area on 09/13/16     Patient informed that his/her managed care company has contracts with or will negotiate with certain facilities, including the following:        Yes   Patient/family informed of bed offers received.  Patient chooses bed at [facility]     Physician recommends and patient chooses bed at      Patient to be transferred to   on  .  Patient to be transferred to facility by       Patient family notified on   of  transfer.  Name of family member notified:        PHYSICIAN       Additional Comment:    _______________________________________________ Antionette Poles, LCSW 09/13/2016, 1:12 PM

## 2016-09-14 ENCOUNTER — Encounter (HOSPITAL_COMMUNITY)
Admission: EM | Disposition: A | Discharge status: Skilled Nursing Facility | Payer: Self-pay | Source: Home / Self Care | Attending: Internal Medicine

## 2016-09-14 ENCOUNTER — Encounter (HOSPITAL_COMMUNITY): Payer: Self-pay | Admitting: Certified Registered Nurse Anesthetist

## 2016-09-14 DIAGNOSIS — E876 Hypokalemia: Secondary | ICD-10-CM

## 2016-09-14 HISTORY — PX: ESOPHAGEAL MANOMETRY: SHX5429

## 2016-09-14 LAB — C DIFFICILE QUICK SCREEN W PCR REFLEX
C DIFFICILE (CDIFF) TOXIN: NEGATIVE
C DIFFICLE (CDIFF) ANTIGEN: NEGATIVE
C Diff interpretation: NOT DETECTED

## 2016-09-14 LAB — BASIC METABOLIC PANEL
ANION GAP: 5 (ref 5–15)
CHLORIDE: 112 mmol/L — AB (ref 101–111)
CO2: 25 mmol/L (ref 22–32)
Calcium: 8.3 mg/dL — ABNORMAL LOW (ref 8.9–10.3)
Creatinine, Ser: 0.82 mg/dL (ref 0.61–1.24)
GFR calc Af Amer: >60 mL/min (ref >60)
GLUCOSE: 139 mg/dL — AB (ref 65–99)
POTASSIUM: 3.6 mmol/L (ref 3.5–5.1)
Sodium: 142 mmol/L (ref 135–145)

## 2016-09-14 LAB — GLUCOSE, CAPILLARY
GLUCOSE-CAPILLARY: 136 mg/dL — AB (ref 65–99)
GLUCOSE-CAPILLARY: 125 mg/dL — AB (ref 65–99)
GLUCOSE-CAPILLARY: 157 mg/dL — AB (ref 65–99)
Glucose-Capillary: 123 mg/dL — ABNORMAL HIGH (ref 65–99)
Glucose-Capillary: 130 mg/dL — ABNORMAL HIGH (ref 65–99)

## 2016-09-14 SURGERY — MANOMETRY, ESOPHAGUS
Anesthesia: Monitor Anesthesia Care

## 2016-09-14 MED ORDER — PROPOFOL 10 MG/ML IV BOLUS
INTRAVENOUS | Status: AC
  Filled 2016-09-14: qty 40

## 2016-09-14 MED ORDER — LIDOCAINE VISCOUS 2 % MT SOLN
OROMUCOSAL | Status: AC
  Filled 2016-09-14: qty 15

## 2016-09-14 SURGICAL SUPPLY — 2 items
FACESHIELD LNG OPTICON STERILE (SAFETY) IMPLANT
GLOVE BIO SURGEON STRL SZ8 (GLOVE) ×6 IMPLANT

## 2016-09-14 NOTE — Progress Notes (Signed)
PT Cancellation Note  Patient Details Name: Andrew Winters MRN: [id number] DOB: 10-09-1940   Cancelled Treatment:    Reason Eval/Treat Not Completed: Patient at procedure or test/unavailable. will check back another day/time. thanks.    Rebeca Alert, MPT Pager: [phone]

## 2016-09-14 NOTE — Progress Notes (Signed)
Occupational Therapy Treatment Patient Details Name: Simran Mannis MRN: [id number] DOB: 21-Dec-1940 Today's Date: 09/14/2016    History of present illness Pt is a 76 year old male admitted for chest pain, possible GI etiology, with hx of DVT, DM, HTN, HLD, peripheral neuropathy, dysphagia   OT comments  Wife present for session and was encouraging  Follow Up Recommendations  SNF    Equipment Recommendations  None recommended by OT    Recommendations for Other Services      Precautions / Restrictions Precautions Precautions: Fall       Mobility Bed Mobility Overal bed mobility: Needs Assistance Bed Mobility: Supine to Sit     Supine to sit: Supervision     General bed mobility comments: supervision for safety, cues for waiting due to lines  Transfers Overall transfer level: Needs assistance Equipment used: Rolling walker (2 wheeled) Transfers: Sit to/from [employer] Sit to Stand: Min assist;From elevated surface Stand pivot transfers: Min assist                ADL Overall ADL's : Needs assistance/impaired Eating/Feeding: Sitting   Grooming: Cueing for safety;Sitting       Lower Body Bathing: Maximal assistance;Sit to/from stand;Cueing for safety;Cueing for sequencing;Cueing for compensatory techniques           Toilet Transfer: Minimal assistance;RW;Ambulation;Cueing for sequencing Toilet Transfer Details (indicate cue type and reason): bed to chair- needed bed elevated to ease sit to stand Toileting- Clothing Manipulation and Hygiene: Minimal assistance;Sit to/from stand;Cueing for safety;Cueing for sequencing                          Cognition   Behavior During Therapy: Flat affect Overall Cognitive Status: History of cognitive impairments - at baseline                                    Pertinent Vitals/ Pain       Pain Assessment: No/denies pain         Frequency  Min 2X/week         Progress Toward Goals  OT Goals(current goals can now be found in the care plan section)  Progress towards OT goals: Progressing toward goals     Plan Discharge plan remains appropriate    Co-evaluation                 End of Session Equipment Utilized During Treatment: Rolling walker  OT Visit Diagnosis: Muscle weakness (generalized) (M62.81);Unsteadiness on feet (R26.81)   Activity Tolerance Patient tolerated treatment well   Patient Left in bed;with call bell/phone within reach;with bed alarm set   Nurse Communication Mobility status        Time: 9702-6378 OT Time Calculation (min): 18 min  Charges: OT General Charges $OT Visit: 1 Procedure OT Treatments $Self Care/Home Management : 8-22 mins  [place], [place] [phone]   Einar Crow D 09/14/2016, 3:50 PM

## 2016-09-14 NOTE — Progress Notes (Signed)
      Progress Note   Subjective  Patient had manometry study done today, as below. Otherwise patient denies any new complaints.    Objective   Vital signs in last 24 hours: Temp:  [98.3 F (36.8 C)-100 F (37.8 C)] 98.3 F (36.8 C) (03/07 0422) Pulse Rate:  [62-77] (P) 64 (03/07 1117) Resp:  [18-24] (P) 24 (03/07 1117) BP: (133-164)/(71-77) (P) 138/70 (03/07 1117) SpO2:  [95 %-97 %] (P) 97 % (03/07 1117) Last BM Date: 09/13/16 General:    AA male in NAD Heart:  Regular rate and rhythm;  Lungs: Respirations even and unlabored, lungs CTA bilaterally Abdomen:  Soft, nontender and nondistended.  Extremities:  Without edema. Neurologic:  Alert and oriented,  grossly normal neurologically. Psych:  Cooperative. Normal mood and affect.  Intake/Output from previous day: 03/06 0701 - 03/07 0700 In: 1783 [I.V.:1783] Out: 2550 [Urine:2050; Stool:500] Intake/Output this shift: Total I/O In: -  Out: 3100 [Urine:3000; Stool:100]  Lab Results: No results for input(s): WBC, HGB, HCT, PLT in the last 72 hours. BMET  Recent Labs  09/12/16 0715 09/13/16 0534 09/14/16 0535  NA 146* 148* 142  K 3.2* 3.4* 3.6  CL 113* 115* 112*  CO2 25 26 25   GLUCOSE 104* 110* 139*  BUN 6 <5* <5*  CREATININE 0.91 0.81 0.82  CALCIUM 8.4* 8.4* 8.3*   LFT  Recent Labs  09/13/16 0534  PROT 6.3*  ALBUMIN 3.1*  AST 18  ALT 12*  ALKPHOS 49  BILITOT 0.6   PT/INR No results for input(s): LABPROT, INR in the last 72 hours.  Studies/Results: No results found.     Assessment / Plan:   76 y/o male with multiple medical problems including history of PE on Xarelto, presenting with chest discomfort and worsening dysphagia. Echocardiogram unremarkable. Symptoms very likely related to esophageal motility disorder based on barium study.   He underwent manometry this morning per endo technician and they were able to pass the catheter without difficulty. I suspect he has achalasia but will await  formal report. If he does have achalasia, given his comorbidities, Botox injection may be the best option for him at this time given his need for anticoagulation. In this light I would prefer to have final report of manometry prior to his EGD so I can plan for Botox injection if there is no other pathology noted, thus plan for EGD tomorrow. Of note, there is another ongoing emergency in endoscopy this morning and the patient's case would be significantly delayed or cancelled regardless.   I discussed this with the patient who verbalized understanding and agreed with the plan. He can be on a liquid diet today and then NPO after MN in preparation for EGD tomorrow morning.   Please call with questions / concerns in the interim.   61, MD [facility] Pager [phone]

## 2016-09-14 NOTE — Progress Notes (Signed)
PROGRESS NOTE    Andrew Winters  OJJ:[id number] DOB: [birth year] DOA: 09/11/2016 PCP: Crist Fat, MD  Outpatient Specialists:   [facility] Dr. Dede Query Dr. Rhea Belton  Brief Narrative:  76 year old male Prior postop LUE DVT subsequent to total knee arthroplasty 6.28 2016-stay@ [facility] rehabilitation after  massive pulmonary embolus 05/2016 Severe dementia Reflux-has had endoscopies 96, 01, O2 3, 06, 12-noted on last episode to prebyesophagus with spasm of lower splinchter--he has been told in the past he would not benefit from repeat dilatation and was placed on dysphagia diet and recommended Reglan  IBD mellitus type 2 Obesity HTN   Assessment & Plan:   Principal Problem:   Chest pain Active Problems:   Dysphagia   On admission found to have hypokalemia 2. LFTs normal range Point of care troponin negative however troponin 0.03 and this stayed stable and admitted for chest pain rule out   Chest pain rule out-troponin trend is flat. EKG from this morning benign--unlikely ACS Feel this is more related to dysphagia-has been told in the past not a good candidate for repeat dilatation,  Manometry on 3/7, possible egd with botox on 3/8 per GI. upgraded diet as per GI  Appreciate SLP (mild aspiration risk) He doesn not require from my perspective any further work-up for cardiac etiology of CP  htn-unconmtrolled, increasing amlodipine 5-->10 Lisinopril 10 od Continue to adjust meds as prn  Hypokalemia : replace, hold lasix for now. D/c ivf  noninsulin dependent Diabetes mellitus: hold home oral meds, on ssi here, monitor-blood sugars ranging from 90-100 a1c 6.2  History of submassive PE and DVT in the past-continue Xarelto  ? Dementia-monitor and assess--is a little confused this admit-needed temproarily some restraints  Diarrhea with watery stool: c diff negative   DVT prophylaxis: NOACs Code Status: FUll Family Communication: patient Disposition Plan:  SNF once EGD done in 48-72 hr   Consultants:   GI  Procedures:   Manometry on 3/7  egd on 3/8  Antimicrobials:   none    Subjective: Some intermittent Agitated overnight, no confusion this am, denies pain, he is a poor historian Watery stools, no n/v, no fever, report some generalized abdominal pain, not sure if reliable, on exam, abdomen soft  Objective: Vitals:   09/13/16 1526 09/13/16 2051 09/13/16 2334 09/14/16 0422  BP: (!) 164/71 133/73  (!) 155/77  Pulse: 62 66  77  Resp: 18 18  18   Temp: 98.5 F (36.9 C) 100 F (37.8 C) 98.9 F (37.2 C) 98.3 F (36.8 C)  TempSrc: Oral Oral Oral Oral  SpO2: 95% 96%  97%  Weight:      Height:        Intake/Output Summary (Last 24 hours) at 09/14/16 0807 Last data filed at 09/14/16 0734  Gross per 24 hour  Intake           664.67 ml  Output             5650 ml  Net         -4985.33 ml   Filed Weights   09/11/16 0801 09/11/16 1711  Weight: 118.4 kg (261 lb) 116 kg (255 lb 11.7 oz)    Examination:  General exam: slight confusion, intermittent agitation-needed breifly restraints Respiratory system: Clear to auscultation. Respiratory effort normal. No wheeze added sound Cardiovascular system: S1 & S2 heard, RRR. No JVD,, M/R/G- No pedal edema. Gastrointestinal system: Abdomen is nondistended, soft  Central nervous system: Alert and oriented. No focal neurological deficits.  Extremities: Symmetric 5 x 5 power. Skin: No rashes, lesions or ulcers Psychiatry: Judgement and insight appear normal. Mood & affect appropriate.     Data Reviewed: I have personally reviewed following labs and imaging studies  CBC:  Recent Labs Lab 09/11/16 0856  WBC 9.7  HGB 10.5*  HCT 33.7*  MCV 83.8  PLT 179   Basic Metabolic Panel:  Recent Labs Lab 09/11/16 0856 09/11/16 2021 09/12/16 0715 09/13/16 0534 09/14/16 0535  NA 144 145 146* 148* 142  K 2.6* 2.8* 3.2* 3.4* 3.6  CL 108 114* 113* 115* 112*  CO2 28 25 25 26 25     GLUCOSE 120* 93 104* 110* 139*  BUN 8 7 6  <5* <5*  CREATININE 1.19 1.00 0.91 0.81 0.82  CALCIUM 8.4* 8.4* 8.4* 8.4* 8.3*  MG  --   --   --  1.9  --    GFR: Estimated Creatinine Clearance: 98.5 mL/min (by C-G formula based on SCr of 0.82 mg/dL). Liver Function Tests:  Recent Labs Lab 09/11/16 0856 09/12/16 0715 09/13/16 0534  AST 15 17 18   ALT 11* 9* 12*  ALKPHOS 53 53 49  BILITOT 0.7 0.7 0.6  PROT 6.4* 6.4* 6.3*  ALBUMIN 3.1* 2.9* 3.1*   No results for input(s): LIPASE, AMYLASE in the last 168 hours. No results for input(s): AMMONIA in the last 168 hours. Coagulation Profile: No results for input(s): INR, PROTIME in the last 168 hours. Cardiac Enzymes:  Recent Labs Lab 09/11/16 2021 09/11/16 2259 09/12/16 0715  TROPONINI 0.03* 0.03* <0.03   BNP (last 3 results) No results for input(s): PROBNP in the last 8760 hours. HbA1C:  Recent Labs  09/11/16 2021  HGBA1C 6.2*   CBG:  Recent Labs Lab 09/13/16 1623 09/13/16 2046 09/13/16 2353 09/14/16 0415 09/14/16 0719  GLUCAP 136* 120* 129* 123* 136*   Lipid Profile: No results for input(s): CHOL, HDL, LDLCALC, TRIG, CHOLHDL, LDLDIRECT in the last 72 hours. Thyroid Function Tests:  Recent Labs  09/11/16 2021  TSH 0.525   Anemia Panel: No results for input(s): VITAMINB12, FOLATE, FERRITIN, TIBC, IRON, RETICCTPCT in the last 72 hours. Urine analysis:    Component Value Date/Time   COLORURINE YELLOW 03/20/2016 2139   APPEARANCEUR CLEAR 03/20/2016 2139   LABSPEC 1.017 03/20/2016 2139   PHURINE 7.5 03/20/2016 2139   GLUCOSEU NEGATIVE 03/20/2016 2139   HGBUR NEGATIVE 03/20/2016 2139   BILIRUBINUR NEGATIVE 03/20/2016 2139   BILIRUBINUR negative 10/08/2015 1925   KETONESUR NEGATIVE 03/20/2016 2139   PROTEINUR NEGATIVE 03/20/2016 2139   UROBILINOGEN 1.0 10/08/2015 1925   UROBILINOGEN 0.2 01/07/2015 2223   NITRITE NEGATIVE 03/20/2016 2139   LEUKOCYTESUR NEGATIVE 03/20/2016 2139   Sepsis  Labs: @LABRCNTIP (procalcitonin:4,lacticidven:4)  )No results found for this or any previous visit (from the past 240 hour(s)).       Radiology Studies: No results found.      Scheduled Meds: . amLODipine  10 mg Oral Daily  . atorvastatin  40 mg Oral QHS  . cholecalciferol  1,000 Units Oral BID  . famotidine  20 mg Oral Daily  . ferrous sulfate  325 mg Oral Q breakfast  . furosemide  20 mg Oral Daily  . insulin aspart  0-15 Units Subcutaneous Q4H  . lisinopril  10 mg Oral Daily  . loratadine  10 mg Oral Daily  . metFORMIN  500 mg Oral Q breakfast  . potassium chloride  10 mEq Oral Daily  . rivaroxaban  20 mg Oral Q supper  . sertraline  25 mg Oral Daily  . sodium chloride flush  3 mL Intravenous Q12H  . traZODone  100 mg Oral QHS  . vitamin E  400 Units Oral Daily   Continuous Infusions: . dextrose 5 %-0.45% nacl with kcl 100 mL/hr at 09/14/16 0114     LOS: 1 day    Time spent: 25    Albertine Grates, MD PhD [facility] (P) [phone]   If 7PM-7AM, please contact night-coverage [email or web address] Password [email or web address] 09/14/2016, 8:07 AM

## 2016-09-14 NOTE — Progress Notes (Signed)
Esophageal Manometry done per protocol.Probe placed without difficulty and study done without complication.  Report shown to dr. Adela Lank.

## 2016-09-14 NOTE — H&P (View-Only) (Signed)
Patient ID: Andrew Winters, male   DOB: 1941-06-21, 76 y.o.   MRN: [id number]    Progress Note   Subjective   Pt having significant difficulty swallowing again today , even pills- goes down but comes back up,has to regurgitate Wife at bedside- has had vey little to eat past several day  No c/o Chest pain    Objective   Vital signs in last 24 hours: Temp:  [98.9 F (37.2 C)-99 F (37.2 C)] 98.9 F (37.2 C) (03/06 0408) Pulse Rate:  [49-69] 65 (03/06 0408) Resp:  [18-20] 18 (03/06 0408) BP: (142-159)/(55-95) 159/95 (03/06 0408) SpO2:  [97 %-98 %] 98 % (03/06 0408) Last BM Date: 09/13/16 General:    Elderly AA male  in NAD Heart:  Regular rate and rhythm; no murmurs Lungs: Respirations even and unlabored, lungs CTA bilaterally Abdomen:  Soft, nontender and nondistended. Normal bowel sounds. Extremities:  Without edema. Neurologic:  Alert and oriented,  grossly normal neurologically. Psych:  Cooperative. Normal mood and affect.  Intake/Output from previous day: 03/05 0701 - 03/06 0700 In: 1636.7 [P.O.:180; I.V.:1456.7] Out: 1400 [Urine:1400] Intake/Output this shift: No intake/output data recorded.  Lab Results:  Recent Labs  09/11/16 0856  WBC 9.7  HGB 10.5*  HCT 33.7*  PLT 179   BMET  Recent Labs  09/11/16 2021 09/12/16 0715 09/13/16 0534  NA 145 146* 148*  K 2.8* 3.2* 3.4*  CL 114* 113* 115*  CO2 25 25 26   GLUCOSE 93 104* 110*  BUN 7 6 <5*  CREATININE 1.00 0.91 0.81  CALCIUM 8.4* 8.4* 8.4*   LFT  Recent Labs  09/13/16 0534  PROT 6.3*  ALBUMIN 3.1*  AST 18  ALT 12*  ALKPHOS 49  BILITOT 0.6   PT/INR No results for input(s): LABPROT, INR in the last 72 hours.  Studies/Results: No results found.     Assessment / Plan:    #1 76 yo AA male with chest pain and dysphagia-  Echo negative Medicine not planning to pursue further cardiac eval  Suspect underlying problem is Achalasia  #2 Hx bilateral PE's 05/2016- remains on Xarelto  #3  AODM #4weight loss/malnutrition (prealb 12)  likely secondary to    Severe dysphagia   Plan; Plan is for diagnostic EGD tomorrow-on Xarelto  And placement of manometry probe at time of EGD   If Manometry consistent with Achalasia- then repeat EGD with BOTOX inpatient  (will need to come off Xarelto ) will be indicated He is to be discharged to a skilled nursing facility on discharge - cannot be discharged until has means of nutritional support     Principal Problem:   Chest pain Active Problems:   Dysphagia     LOS: 0 days   Andrew Winters  09/13/2016, 11:00 AM

## 2016-09-14 NOTE — Interval H&P Note (Signed)
History and Physical Interval Note:  09/14/2016 11:18 AM  Andrew Winters  has presented today for surgery, with the diagnosis of dysphagia  The various methods of treatment have been discussed with the patient and family. After consideration of risks, benefits and other options for treatment, the patient has consented to  Procedure(s): ESOPHAGOGASTRODUODENOSCOPY (EGD) WITH PROPOFOL (N/A) as a surgical intervention .  The patient's history has been reviewed, patient examined, no change in status, stable for surgery.  I have reviewed the patient's chart and labs.  Questions were answered to the patient's satisfaction.     Reeves Forth Blondie Riggsbee

## 2016-09-14 NOTE — Progress Notes (Signed)
Dr. Adela Lank, MD was ok to hold tonight's dose of xarelto. Pt is scheduled for EGD tomorrow 09/15/16

## 2016-09-14 NOTE — Anesthesia Preprocedure Evaluation (Addendum)
Anesthesia Evaluation  Patient identified by MRN, date of birth, ID band Patient awake    Reviewed: Allergy & Precautions, NPO status , Patient's Chart, lab work & pertinent test results  Airway Mallampati: III  TM Distance: >3 FB Neck ROM: full    Dental  (+) Teeth Intact, Dental Advidsory Given   Pulmonary pneumonia, Current Smoker, former smoker,    breath sounds clear to auscultation       Cardiovascular hypertension, On Medications + Peripheral Vascular Disease   Rhythm:Regular Rate:Normal     Neuro/Psych  Headaches, PSYCHIATRIC DISORDERS Anxiety Depression  Neuromuscular disease    GI/Hepatic hiatal hernia, GERD  Medicated and Controlled,  Endo/Other  diabetes, Type 2, Oral Hypoglycemic AgentsHyperthyroidism   Renal/GU      Musculoskeletal  (+) Arthritis ,   Abdominal (+) + obese,   Peds  Hematology  (+) anemia ,   Anesthesia Other Findings   Reproductive/Obstetrics                             Anesthesia Physical  Anesthesia Plan  ASA: III  Anesthesia Plan: MAC   Post-op Pain Management:    Induction: Intravenous  Airway Management Planned:   Additional Equipment:   Intra-op Plan:   Post-operative Plan:   Informed Consent: I have reviewed the patients History and Physical, chart, labs and discussed the procedure including the risks, benefits and alternatives for the proposed anesthesia with the patient or authorized representative who has indicated his/her understanding and acceptance.   Dental advisory given  Plan Discussed with: CRNA  Anesthesia Plan Comments:        Anesthesia Quick Evaluation

## 2016-09-15 ENCOUNTER — Encounter (HOSPITAL_COMMUNITY)
Admission: EM | Disposition: A | Discharge status: Skilled Nursing Facility | Payer: Self-pay | Source: Home / Self Care | Attending: Internal Medicine

## 2016-09-15 ENCOUNTER — Inpatient Hospital Stay (HOSPITAL_COMMUNITY): Payer: Medicare Other | Admitting: Certified Registered Nurse Anesthetist

## 2016-09-15 ENCOUNTER — Encounter (HOSPITAL_COMMUNITY): Payer: Self-pay | Admitting: *Deleted

## 2016-09-15 DIAGNOSIS — R079 Chest pain, unspecified: Secondary | ICD-10-CM

## 2016-09-15 HISTORY — PX: ESOPHAGOGASTRODUODENOSCOPY (EGD) WITH PROPOFOL: SHX5813

## 2016-09-15 LAB — BASIC METABOLIC PANEL
ANION GAP: 6 (ref 5–15)
BUN: <5 mg/dL — ABNORMAL LOW (ref 6–20)
CALCIUM: 8.4 mg/dL — AB (ref 8.9–10.3)
CO2: 25 mmol/L (ref 22–32)
CREATININE: 0.83 mg/dL (ref 0.61–1.24)
Chloride: 110 mmol/L (ref 101–111)
Glucose, Bld: 114 mg/dL — ABNORMAL HIGH (ref 65–99)
Potassium: 3.6 mmol/L (ref 3.5–5.1)
SODIUM: 141 mmol/L (ref 135–145)

## 2016-09-15 LAB — GLUCOSE, CAPILLARY
GLUCOSE-CAPILLARY: 117 mg/dL — AB (ref 65–99)
GLUCOSE-CAPILLARY: 92 mg/dL (ref 65–99)
Glucose-Capillary: 106 mg/dL — ABNORMAL HIGH (ref 65–99)
Glucose-Capillary: 92 mg/dL (ref 65–99)
Glucose-Capillary: 98 mg/dL (ref 65–99)

## 2016-09-15 LAB — MAGNESIUM: MAGNESIUM: 1.8 mg/dL (ref 1.7–2.4)

## 2016-09-15 SURGERY — ESOPHAGOGASTRODUODENOSCOPY (EGD) WITH PROPOFOL
Anesthesia: Monitor Anesthesia Care

## 2016-09-15 MED ORDER — TRAMADOL HCL 50 MG PO TABS: 50.0000 mg | ORAL_TABLET | Freq: Once | ORAL | Status: DC

## 2016-09-15 MED ORDER — LIDOCAINE 2% (20 MG/ML) 5 ML SYRINGE
INTRAMUSCULAR | Status: AC
  Filled 2016-09-15: qty 5

## 2016-09-15 MED ORDER — LACTATED RINGERS IV SOLN
INTRAVENOUS | Status: DC | PRN
  Administered 2016-09-15: 11:00:00 via INTRAVENOUS

## 2016-09-15 MED ORDER — PROPOFOL 10 MG/ML IV BOLUS
INTRAVENOUS | Status: DC | PRN
  Administered 2016-09-15: 20 mg via INTRAVENOUS

## 2016-09-15 MED ORDER — PROPOFOL 10 MG/ML IV BOLUS
INTRAVENOUS | Status: AC
  Filled 2016-09-15: qty 60

## 2016-09-15 MED ORDER — DILTIAZEM HCL 60 MG PO TABS
60.0000 mg | ORAL_TABLET | Freq: Three times a day (TID) | ORAL | Status: DC
  Administered 2016-09-15 – 2016-09-17 (×7): 60 mg via ORAL
  Filled 2016-09-15 (×13): qty 1

## 2016-09-15 MED ORDER — MORPHINE SULFATE (PF) 4 MG/ML IV SOLN
2.0000 mg | Freq: Once | INTRAVENOUS | Status: AC
  Administered 2016-09-15: 2 mg via INTRAVENOUS
  Filled 2016-09-15: qty 1

## 2016-09-15 MED ORDER — PROPOFOL 500 MG/50ML IV EMUL
INTRAVENOUS | Status: DC | PRN
  Administered 2016-09-15: 125 ug/kg/min via INTRAVENOUS

## 2016-09-15 SURGICAL SUPPLY — 15 items

## 2016-09-15 NOTE — Progress Notes (Signed)
  Physical Therapy Treatment Patient Details Name: Andrew Winters MRN: [id number] DOB: 08-11-1940 Today's Date: 09/15/2016    History of Present Illness Pt is a 76 year old male admitted for chest pain, possible GI etiology, with hx of DVT, DM, HTN, HLD, peripheral neuropathy, dysphagia    PT Comments    Pt ambulated in hallway and fatigues quickly.  Spouse present and reports plan is for SNF since she does not feel able to manage pt at home.  Pt to have EGD this morning.  Follow Up Recommendations  Supervision/Assistance - 24 hour;SNF     Equipment Recommendations  None recommended by PT    Recommendations for Other Services       Precautions / Restrictions Precautions Precautions: Fall    Mobility  Bed Mobility Overal bed mobility: Needs Assistance Bed Mobility: Supine to Sit;Sit to Supine     Supine to sit: Min assist Sit to supine: Min assist   General bed mobility comments: assist for trunk upright and LEs onto bed  Transfers Overall transfer level: Needs assistance Equipment used: Rolling walker (2 wheeled) Transfers: Sit to/from Stand Sit to Stand: Min assist         General transfer comment: verbal cues for safe technique, assist to rise and control descent  Ambulation/Gait Ambulation/Gait assistance: Min guard Ambulation Distance (Feet): 120 Feet Assistive device: Rolling walker (2 wheeled) Gait Pattern/deviations: Step-through pattern;Decreased stride length     General Gait Details: verbal cues for safe use of RW, very slow pace, reports weak LEs and fatigue   Stairs            Wheelchair Mobility    Modified Rankin (Stroke Patients Only)       Balance                                    Cognition Arousal/Alertness: Awake/alert Behavior During Therapy: Flat affect Overall Cognitive Status: History of cognitive impairments - at baseline                      Exercises      General Comments         Pertinent Vitals/Pain Pain Assessment: No/denies pain    Home Living                      Prior Function            PT Goals (current goals can now be found in the care plan section) Progress towards PT goals: Progressing toward goals    Frequency    Min 3X/week      PT Plan Current plan remains appropriate    Co-evaluation             End of Session Equipment Utilized During Treatment: Gait belt Activity Tolerance: Patient tolerated treatment well Patient left: in bed;with call bell/phone within reach;with family/visitor present   PT Visit Diagnosis: Difficulty in walking, not elsewhere classified (R26.2)     Time: 5093-2671 PT Time Calculation (min) (ACUTE ONLY): 17 min  Charges:  $Gait Training: 8-22 mins                    G Codes:       Tevon Berhane,KATHrine E 09/15/2016, 10:28 AM  Zenovia Jarred, PT, DPT 09/15/2016 Pager: [phone]

## 2016-09-15 NOTE — Interval H&P Note (Signed)
History and Physical Interval Note: Patient has EGJ outflow obstruction on manometry, had some panesophageal pressurization but had some swallows progress, so no achalasia. I held his Xarelto last night, now almost 48 hours since last dose, okay for dilation if needed. Discussed risks / benefits he wishes to proceed with EGD.   09/15/2016 10:16 AM  [name]  has presented today for surgery, with the diagnosis of dysphagia  The various methods of treatment have been discussed with the patient and family. After consideration of risks, benefits and other options for treatment, the patient has consented to  Procedure(s): ESOPHAGOGASTRODUODENOSCOPY (EGD) WITH PROPOFOL (N/A) as a surgical intervention .  The patient's history has been reviewed, patient examined, no change in status, stable for surgery.  I have reviewed the patient's chart and labs.  Questions were answered to the patient's satisfaction.     Reeves Forth Armbruster

## 2016-09-15 NOTE — Transfer of Care (Signed)
Immediate Anesthesia Transfer of Care Note  Patient: Andrew Winters  Procedure(s) Performed: Procedure(s): ESOPHAGOGASTRODUODENOSCOPY (EGD) WITH PROPOFOL (N/A)  Patient Location: PACU and Endoscopy Unit  Anesthesia Type:MAC  Level of Consciousness: patient cooperative  Airway & Oxygen Therapy: Patient Spontanous Breathing and Patient connected to nasal cannula oxygen  Post-op Assessment: Report given to RN and Post -op Vital signs reviewed and stable  Post vital signs: Reviewed and stable  Last Vitals:  Vitals:   09/15/16 0418 09/15/16 1004  BP: (!) 145/76 (!) 141/77  Pulse: 70 75  Resp: 20 (!) 21  Temp: 37.6 C 36.7 C    Last Pain:  Vitals:   09/15/16 1004  TempSrc: Oral  PainSc:       Patients Stated Pain Goal: 3 (14/48/18 5631)  Complications: No apparent anesthesia complications

## 2016-09-15 NOTE — Anesthesia Postprocedure Evaluation (Signed)
Anesthesia Post Note  Patient: Andrew Winters  Procedure(s) Performed: Procedure(s) (LRB): ESOPHAGOGASTRODUODENOSCOPY (EGD) WITH PROPOFOL (N/A)  Patient location during evaluation: PACU Anesthesia Type: MAC Level of consciousness: awake and alert Pain management: pain level controlled Vital Signs Assessment: post-procedure vital signs reviewed and stable Respiratory status: spontaneous breathing, nonlabored ventilation, respiratory function stable and patient connected to nasal cannula oxygen Cardiovascular status: stable and blood pressure returned to baseline Anesthetic complications: no       Last Vitals:  Vitals:   09/15/16 1159 09/15/16 1255  BP:  (!) 149/72  Pulse:  70  Resp:  20  Temp: 37.2 C 37.2 C    Last Pain:  Vitals:   09/15/16 1255  TempSrc: Oral  PainSc:                  Lynda Rainwater

## 2016-09-15 NOTE — Op Note (Signed)
[facility] Patient Name: Andrew Winters Procedure Date: 09/15/2016 MRN: [id number] Attending MD: Carlota Raspberry. Rowen Hur MD, MD Date of Birth: 1940-08-09 CSN: [id number] Age: 76 Admit Type: Inpatient Procedure:                Upper GI endoscopy Indications:              Dysphagia, abnormal barium study and manometry                            which is consistent with GEJ outflow obstruction.                            EGD to further evaluate Providers:                [name] MD, MD, [name] RN,                            RN, [name], Technician, [name]. [name]                            CRNA, CRNA Referring MD:              Medicines:                Monitored Anesthesia Care Complications:            No immediate complications. Estimated blood loss:                            Minimal. Estimated Blood Loss:     Estimated blood loss was minimal. Procedure:                Pre-Anesthesia Assessment:                           - Prior to the procedure, a History and Physical                            was performed, and patient medications and                            allergies were reviewed. The patient's tolerance of                            previous anesthesia was also reviewed. The risks                            and benefits of the procedure and the sedation                            options and risks were discussed with the patient.                            All questions were answered, and informed consent                            was  obtained. Prior Anticoagulants: The patient has                            taken Xarelto (rivaroxaban), last dose was 2 days                            prior to procedure. ASA Grade Assessment: III - A                            patient with severe systemic disease. After                            reviewing the risks and benefits, the patient was                            deemed in satisfactory  condition to undergo the                            procedure.                           After obtaining informed consent, the endoscope was                            passed under direct vision. Throughout the                            procedure, the patient's blood pressure, pulse, and                            oxygen saturations were monitored continuously. The                            Endoscope was introduced through the mouth, and                            advanced to the second part of duodenum. The upper                            GI endoscopy was accomplished without difficulty.                            The patient tolerated the procedure well. Scope In: Scope Out: Findings:      Esophagogastric landmarks were identified: the Z-line was found at 42       cm, the gastroesophageal junction was found at 42 cm and the upper       extent of the gastric folds was found at 42 cm from the incisors.      The lumen of the upper third of the esophagus was mildly dilated.      A hypertonic lower esophageal sphincter was found. There was moderate       resistance to endoscope advancement into the stomach, but no focal       stricture / stenosis appreciated, no mass lesions. The gastroesophageal  junction and cardia were normal on retroflexed view. A TTS dilator was       passed through the scope. Dilation with a 12-13.5-15 mm balloon and then       an 18-19-20 mm balloon dilator was performed to max diameter of 20 mm.       Very superficial mucosal wrent was noted after this intervention but no       significant bleeding.      The entire examined stomach was normal.      The duodenal bulb and second portion of the duodenum were normal. Impression:               - Esophagogastric landmarks identified.                           - Dilated proximal esophagus.                           - Spastic LES consistent with known diagnosis of                            EGJ outflow obstruction  . Dilated to 65m with                            balloon without any significant mucosal wrent.                           - Normal stomach.                           - Normal duodenal bulb and second portion of the                            duodenum. Moderate Sedation:      No moderate sedation, case performed with MAC Recommendation:           - Return patient to hospital ward for ongoing care.                           - Full liquid diet, advance to soft as tolerated.                           - Continue present medications.                           - Resume Xarelto tomorrow morning                           - If possible, would stop Nifedipine and change to                            Diltiazem 640mthree times per day for GEJ outflow                            obstruction, reduce esophageal spasm                           -  If tolerating diet can be discharged later today                            or tomorrow                           - He will need close follow up as outpatient and                            consider additional treatment (larger / bag                            dilation) with Dr. Silverio Decamp if symptoms persist Procedure Code(s):        --- Professional ---                           [phone], Esophagogastroduodenoscopy, flexible,                            transoral; with transendoscopic balloon dilation of                            esophagus (less than 30 mm diameter) Diagnosis Code(s):        --- Professional ---                           K22.8, Other specified diseases of esophagus                           R13.10, Dysphagia, unspecified CPT copyright 2016 [employer]. All rights reserved. The codes documented in this report are preliminary and upon coder review may  be revised to meet current compliance requirements. Remo Lipps P. Varina Hulon MD, MD 09/15/2016 12:05:42 PM This report has been signed electronically. Number of Addenda: 0

## 2016-09-15 NOTE — H&P (View-Only) (Signed)
      Progress Note   Subjective  Patient had manometry study done today, as below. Otherwise patient denies any new complaints.    Objective   Vital signs in last 24 hours: Temp:  [98.3 F (36.8 C)-100 F (37.8 C)] 98.3 F (36.8 C) (03/07 0422) Pulse Rate:  [62-77] (P) 64 (03/07 1117) Resp:  [18-24] (P) 24 (03/07 1117) BP: (133-164)/(71-77) (P) 138/70 (03/07 1117) SpO2:  [95 %-97 %] (P) 97 % (03/07 1117) Last BM Date: 09/13/16 General:    AA male in NAD Heart:  Regular rate and rhythm;  Lungs: Respirations even and unlabored, lungs CTA bilaterally Abdomen:  Soft, nontender and nondistended.  Extremities:  Without edema. Neurologic:  Alert and oriented,  grossly normal neurologically. Psych:  Cooperative. Normal mood and affect.  Intake/Output from previous day: 03/06 0701 - 03/07 0700 In: 1783 [I.V.:1783] Out: 2550 [Urine:2050; Stool:500] Intake/Output this shift: Total I/O In: -  Out: 3100 [Urine:3000; Stool:100]  Lab Results: No results for input(s): WBC, HGB, HCT, PLT in the last 72 hours. BMET  Recent Labs  09/12/16 0715 09/13/16 0534 09/14/16 0535  NA 146* 148* 142  K 3.2* 3.4* 3.6  CL 113* 115* 112*  CO2 25 26 25  GLUCOSE 104* 110* 139*  BUN 6 <5* <5*  CREATININE 0.91 0.81 0.82  CALCIUM 8.4* 8.4* 8.3*   LFT  Recent Labs  09/13/16 0534  PROT 6.3*  ALBUMIN 3.1*  AST 18  ALT 12*  ALKPHOS 49  BILITOT 0.6   PT/INR No results for input(s): LABPROT, INR in the last 72 hours.  Studies/Results: No results found.     Assessment / Plan:   75 y/o male with multiple medical problems including history of PE on Xarelto, presenting with chest discomfort and worsening dysphagia. Echocardiogram unremarkable. Symptoms very likely related to esophageal motility disorder based on barium study.   He underwent manometry this morning per endo technician and they were able to pass the catheter without difficulty. I suspect he has achalasia but will await  formal report. If he does have achalasia, given his comorbidities, Botox injection may be the best option for him at this time given his need for anticoagulation. In this light I would prefer to have final report of manometry prior to his EGD so I can plan for Botox injection if there is no other pathology noted, thus plan for EGD tomorrow. Of note, there is another ongoing emergency in endoscopy this morning and the patient's case would be significantly delayed or cancelled regardless.   I discussed this with the patient who verbalized understanding and agreed with the plan. He can be on a liquid diet today and then NPO after MN in preparation for EGD tomorrow morning.   Please call with questions / concerns in the interim.   Steven Armbruster, MD [facility] Pager [phone]   

## 2016-09-15 NOTE — Anesthesia Procedure Notes (Addendum)
Procedure Name: MAC Date/Time: 09/15/2016 11:29 AM Performed by: Dione Booze Pre-anesthesia Checklist: Emergency Drugs available, Suction available, Patient being monitored and Patient identified Oxygen Delivery Method: Nasal cannula Placement Confirmation: positive ETCO2

## 2016-09-15 NOTE — Progress Notes (Signed)
PROGRESS NOTE    Andrew Winters  UJW:[id number] DOB: [birth year] DOA: 09/11/2016 PCP: Crist Fat, MD  Outpatient Specialists:   [facility] Dr. Dede Query Dr. Rhea Belton  Brief Narrative:  76 year old male Prior postop LUE DVT subsequent to total knee arthroplasty 6.28 2016-stay@ [facility] rehabilitation after  massive pulmonary embolus 05/2016 Severe dementia Reflux-has had endoscopies 96, 01, O2 3, 06, 12-noted on last episode to prebyesophagus with spasm of lower splinchter--he has been told in the past he would not benefit from repeat dilatation and was placed on dysphagia diet and recommended Reglan  IBD mellitus type 2 Obesity HTN   Assessment & Plan:   Principal Problem:   Chest pain Active Problems:   Dysphagia   Hypokalemia   On admission found to have hypokalemia 2. LFTs normal range Point of care troponin negative however troponin 0.03 and this stayed stable and admitted for chest pain rule out   Chest pain rule out-troponin trend is flat. EKG from this morning benign--unlikely ACS Feel this is more related to dysphagia-has been told in the past not a good candidate for repeat dilatation,  Manometry on 3/7, egd with balloon  Dilation on 3/8 per GI. upgraded diet as per GI  Appreciate SLP (mild aspiration risk) He doesn not require from my perspective any further work-up for cardiac etiology of CP  htn-unconmtrolled, increasing amlodipine 5-->10 Lisinopril 10 od Continue to adjust meds as prn  Hypokalemia : replace, hold lasix for now. D/c ivf  noninsulin dependent Diabetes mellitus: hold home oral meds, on ssi here, monitor-blood sugars ranging from 90-100 a1c 6.2  History of submassive PE and DVT in the past-continue Xarelto  ? Dementia-monitor and assess--is a little confused this admit-needed temproarily some restraints  Diarrhea with watery stool: c diff negative   DVT prophylaxis: NOACs Code Status: FUll Family Communication:  patient Disposition Plan: SNF once EGD done in 48-72 hr   Consultants:   GI  Procedures:   Manometry on 3/7  egd on 3/8  Antimicrobials:   none    Subjective:  he is a poor historian Returned from EGD  Objective: Vitals:   09/15/16 0418 09/15/16 1004 09/15/16 1159 09/15/16 1255  BP: (!) 145/76 (!) 141/77  (!) 149/72  Pulse: 70 75  70  Resp: 20 (!) 21  20  Temp: 99.7 F (37.6 C) 98 F (36.7 C) 99 F (37.2 C) 98.9 F (37.2 C)  TempSrc: Oral Oral Oral Oral  SpO2: 96% 93%  96%  Weight:      Height:        Intake/Output Summary (Last 24 hours) at 09/15/16 1813 Last data filed at 09/15/16 1241  Gross per 24 hour  Intake              500 ml  Output             2300 ml  Net            -1800 ml   Filed Weights   09/11/16 0801 09/11/16 1711  Weight: 118.4 kg (261 lb) 116 kg (255 lb 11.7 oz)    Examination:  General exam: slight confusion, intermittent agitation-needed breifly restraints Respiratory system: Clear to auscultation. Respiratory effort normal. No wheeze added sound Cardiovascular system: S1 & S2 heard, RRR. No JVD,, M/R/G- No pedal edema. Gastrointestinal system: Abdomen is nondistended, soft  Central nervous system: Alert and oriented. No focal neurological deficits. Extremities: Symmetric 5 x 5 power. Skin: No rashes, lesions or ulcers Psychiatry:  Judgement and insight appear normal. Mood & affect appropriate.     Data Reviewed: I have personally reviewed following labs and imaging studies  CBC:  Recent Labs Lab 09/11/16 0856  WBC 9.7  HGB 10.5*  HCT 33.7*  MCV 83.8  PLT 179   Basic Metabolic Panel:  Recent Labs Lab 09/11/16 2021 09/12/16 0715 09/13/16 0534 09/14/16 0535 09/15/16 0544  NA 145 146* 148* 142 141  K 2.8* 3.2* 3.4* 3.6 3.6  CL 114* 113* 115* 112* 110  CO2 25 25 26 25 25   GLUCOSE 93 104* 110* 139* 114*  BUN 7 6 <5* <5* <5*  CREATININE 1.00 0.91 0.81 0.82 0.83  CALCIUM 8.4* 8.4* 8.4* 8.3* 8.4*  MG  --   --   1.9  --  1.8   GFR: Estimated Creatinine Clearance: 96.6 mL/min (by C-G formula based on SCr of 0.83 mg/dL). Liver Function Tests:  Recent Labs Lab 09/11/16 0856 09/12/16 0715 09/13/16 0534  AST 15 17 18   ALT 11* 9* 12*  ALKPHOS 53 53 49  BILITOT 0.7 0.7 0.6  PROT 6.4* 6.4* 6.3*  ALBUMIN 3.1* 2.9* 3.1*   No results for input(s): LIPASE, AMYLASE in the last 168 hours. No results for input(s): AMMONIA in the last 168 hours. Coagulation Profile: No results for input(s): INR, PROTIME in the last 168 hours. Cardiac Enzymes:  Recent Labs Lab 09/11/16 2021 09/11/16 2259 09/12/16 0715  TROPONINI 0.03* 0.03* <0.03   BNP (last 3 results) No results for input(s): PROBNP in the last 8760 hours. HbA1C: No results for input(s): HGBA1C in the last 72 hours. CBG:  Recent Labs Lab 09/14/16 2009 09/15/16 0029 09/15/16 0415 09/15/16 0800 09/15/16 1531  GLUCAP 130* 92 117* 106* 92   Lipid Profile: No results for input(s): CHOL, HDL, LDLCALC, TRIG, CHOLHDL, LDLDIRECT in the last 72 hours. Thyroid Function Tests: No results for input(s): TSH, T4TOTAL, FREET4, T3FREE, THYROIDAB in the last 72 hours. Anemia Panel: No results for input(s): VITAMINB12, FOLATE, FERRITIN, TIBC, IRON, RETICCTPCT in the last 72 hours. Urine analysis:    Component Value Date/Time   COLORURINE YELLOW 03/20/2016 2139   APPEARANCEUR CLEAR 03/20/2016 2139   LABSPEC 1.017 03/20/2016 2139   PHURINE 7.5 03/20/2016 2139   GLUCOSEU NEGATIVE 03/20/2016 2139   HGBUR NEGATIVE 03/20/2016 2139   BILIRUBINUR NEGATIVE 03/20/2016 2139   BILIRUBINUR negative 10/08/2015 1925   KETONESUR NEGATIVE 03/20/2016 2139   PROTEINUR NEGATIVE 03/20/2016 2139   UROBILINOGEN 1.0 10/08/2015 1925   UROBILINOGEN 0.2 01/07/2015 2223   NITRITE NEGATIVE 03/20/2016 2139   LEUKOCYTESUR NEGATIVE 03/20/2016 2139   Sepsis Labs: @LABRCNTIP (procalcitonin:4,lacticidven:4)  ) Recent Results (from the past 240 hour(s))  C difficile  quick scan w PCR reflex     Status: None   Collection Time: 09/14/16  9:28 AM  Result Value Ref Range Status   C Diff antigen NEGATIVE NEGATIVE Final   C Diff toxin NEGATIVE NEGATIVE Final   C Diff interpretation No C. difficile detected.  Final         Radiology Studies: No results found.      Scheduled Meds: . atorvastatin  40 mg Oral QHS  . cholecalciferol  1,000 Units Oral BID  . diltiazem  60 mg Oral Q8H  . famotidine  20 mg Oral Daily  . ferrous sulfate  325 mg Oral Q breakfast  . insulin aspart  0-15 Units Subcutaneous Q4H  . lisinopril  10 mg Oral Daily  . loratadine  10 mg Oral Daily  .  potassium chloride  10 mEq Oral Daily  . rivaroxaban  20 mg Oral Q supper  . sertraline  25 mg Oral Daily  . sodium chloride flush  3 mL Intravenous Q12H  . traMADol  50 mg Oral Once  . traZODone  100 mg Oral QHS  . vitamin E  400 Units Oral Daily   Continuous Infusions:    LOS: 2 days    Time spent: 39    Albertine Grates, MD PhD [facility] ([phone]   If 7PM-7AM, please contact night-coverage [email or web address] Password [facility] 09/15/2016, 6:13 PM

## 2016-09-16 ENCOUNTER — Inpatient Hospital Stay (HOSPITAL_COMMUNITY): Payer: Medicare Other

## 2016-09-16 DIAGNOSIS — F0150 Vascular dementia without behavioral disturbance: Secondary | ICD-10-CM

## 2016-09-16 DIAGNOSIS — R509 Fever, unspecified: Secondary | ICD-10-CM

## 2016-09-16 DIAGNOSIS — N390 Urinary tract infection, site not specified: Secondary | ICD-10-CM

## 2016-09-16 DIAGNOSIS — I10 Essential (primary) hypertension: Secondary | ICD-10-CM

## 2016-09-16 LAB — BASIC METABOLIC PANEL
Anion gap: 5 (ref 5–15)
BUN: 6 mg/dL (ref 6–20)
CHLORIDE: 112 mmol/L — AB (ref 101–111)
CO2: 25 mmol/L (ref 22–32)
CREATININE: 0.86 mg/dL (ref 0.61–1.24)
Calcium: 8.6 mg/dL — ABNORMAL LOW (ref 8.9–10.3)
GFR calc Af Amer: >60 mL/min (ref >60)
GFR calc non Af Amer: >60 mL/min (ref >60)
Glucose, Bld: 129 mg/dL — ABNORMAL HIGH (ref 65–99)
POTASSIUM: 3.1 mmol/L — AB (ref 3.5–5.1)
Sodium: 142 mmol/L (ref 135–145)

## 2016-09-16 LAB — GLUCOSE, CAPILLARY
GLUCOSE-CAPILLARY: 107 mg/dL — AB (ref 65–99)
GLUCOSE-CAPILLARY: 108 mg/dL — AB (ref 65–99)
GLUCOSE-CAPILLARY: 110 mg/dL — AB (ref 65–99)
Glucose-Capillary: 132 mg/dL — ABNORMAL HIGH (ref 65–99)
Glucose-Capillary: 115 mg/dL — ABNORMAL HIGH (ref 65–99)
Glucose-Capillary: 121 mg/dL — ABNORMAL HIGH (ref 65–99)

## 2016-09-16 LAB — URINALYSIS, ROUTINE W REFLEX MICROSCOPIC
Bilirubin Urine: NEGATIVE
GLUCOSE, UA: NEGATIVE mg/dL
HGB URINE DIPSTICK: NEGATIVE
Ketones, ur: NEGATIVE mg/dL
NITRITE: NEGATIVE
PH: 6 (ref 5.0–8.0)
PROTEIN: NEGATIVE mg/dL
SPECIFIC GRAVITY, URINE: 1.01 (ref 1.005–1.030)
Squamous Epithelial / LPF: NONE SEEN

## 2016-09-16 LAB — CBC
HEMATOCRIT: 33.8 % — AB (ref 39.0–52.0)
HEMOGLOBIN: 10.4 g/dL — AB (ref 13.0–17.0)
MCH: 25.7 pg — ABNORMAL LOW (ref 26.0–34.0)
MCHC: 30.8 g/dL (ref 30.0–36.0)
MCV: 83.7 fL (ref 78.0–100.0)
Platelets: 235 10*3/uL (ref 150–400)
RBC: 4.04 MIL/uL — ABNORMAL LOW (ref 4.22–5.81)
RDW: 18.2 % — ABNORMAL HIGH (ref 11.5–15.5)
WBC: 5.4 10*3/uL (ref 4.0–10.5)

## 2016-09-16 LAB — MRSA PCR SCREENING: MRSA by PCR: NEGATIVE

## 2016-09-16 LAB — MAGNESIUM: Magnesium: 1.9 mg/dL (ref 1.7–2.4)

## 2016-09-16 MED ORDER — DEXTROSE 5 % IV SOLN
1.0000 g | INTRAVENOUS | Status: DC
  Administered 2016-09-16: 1 g via INTRAVENOUS
  Filled 2016-09-16 (×2): qty 10

## 2016-09-16 MED ORDER — POTASSIUM CHLORIDE CRYS ER 20 MEQ PO TBCR
40.0000 meq | EXTENDED_RELEASE_TABLET | ORAL | Status: AC
  Administered 2016-09-16 (×2): 40 meq via ORAL
  Filled 2016-09-16 (×2): qty 2

## 2016-09-16 MED ORDER — MEMANTINE HCL 10 MG PO TABS
5.0000 mg | ORAL_TABLET | Freq: Every day | ORAL | Status: DC
  Administered 2016-09-16 – 2016-09-17 (×2): 5 mg via ORAL
  Filled 2016-09-16 (×2): qty 1

## 2016-09-16 MED ORDER — GI COCKTAIL ~~LOC~~
30.0000 mL | Freq: Once | ORAL | Status: AC
  Administered 2016-09-16: 30 mL via ORAL
  Filled 2016-09-16: qty 30

## 2016-09-16 NOTE — Care Management Important Message (Signed)
Important Message  Patient Details  Name: Andrew Winters MRN: [id number] Date of Birth: 11-04-1940   Medicare Important Message Given:  Yes    McGibboneyFelicity Coyer, RN 09/16/2016, 2:48 PM

## 2016-09-16 NOTE — Care Management Note (Signed)
Case Management Note  Patient Details  Name: Andrew Winters MRN: [id number] Date of Birth: Aug 29, 1940  Subjective/Objective:   Pt admitted with cco Chest pain, found to have Dysphagia, Hypokalemia,                 Action/Plan: Plan to discharge to [facility]   Expected Discharge Date:   09/18/16               Expected Discharge Plan:  Skilled Nursing Facility  In-House Referral:  Clinical Social Work  Discharge planning Services  CM Consult  Post Acute Care Choice:  NA Choice offered to:  NA  DME Arranged:  N/A DME Agency:  NA  HH Arranged:  NA HH Agency:  NA  Status of Service:  In process, will continue to follow  If discussed at Long Length of Stay Meetings, dates discussed:    Additional CommentsGeni Bers, RN 09/16/2016, 10:43 AM

## 2016-09-16 NOTE — Anesthesia Postprocedure Evaluation (Signed)
Anesthesia Post Note  Patient: Andrew Winters  Procedure(s) Performed: Procedure(s) (LRB): ESOPHAGOGASTRODUODENOSCOPY (EGD) WITH PROPOFOL (N/A)  Anesthesia Type: MAC       Last Vitals:  Vitals:   09/15/16 2226 09/16/16 0545  BP:  116/67  Pulse:  64  Resp:  18  Temp: 36.8 C 36.5 C    Last Pain:  Vitals:   09/16/16 0545  TempSrc: Oral  PainSc:                  Lynda Rainwater

## 2016-09-16 NOTE — Progress Notes (Signed)
     [facility] Progress Note  Chief Complaint:   Swallowing problems and chest pain  Subjective: Ate all of his breakfast without difficult. Swallowing much better. Liquid BM today  Objective:  Vital signs in last 24 hours: Temp:  [97.7 F (36.5 C)-101.1 F (38.4 C)] 97.7 F (36.5 C) (03/09 0545) Pulse Rate:  [64-70] 64 (03/09 0545) Resp:  [18-20] 18 (03/09 0545) BP: (116-149)/(61-72) 116/67 (03/09 0545) SpO2:  [96 %-97 %] 97 % (03/09 0545) Last BM Date: 09/15/16 General:   Alert, well-developed, black male in NAD EENT:  Normal hearing, non icteric sclera, conjunctive pink.  Heart:  Regular rate and rhythm, trace bilateral lower extremity edema Pulm: Normal respiratory effort,. Abdomen:  Soft, mildly distended with active bowel sounds. Nontender.     Neurologic:  Alert and  oriented x4;  grossly normal neurologically. Psych:  Alert and cooperative. Normal mood and affect.   Intake/Output from previous day: 03/08 0701 - 03/09 0700 In: 620 [P.O.:120; I.V.:500] Out: 1001 [Urine:1000; Stool:1] Intake/Output this shift: Total I/O In: 120 [P.O.:120] Out: -   Lab Results:  Recent Labs  09/16/16 0552  WBC 5.4  HGB 10.4*  HCT 33.8*  PLT 235   BMET  Recent Labs  09/14/16 0535 09/15/16 0544 09/16/16 0552  NA 142 141 142  K 3.6 3.6 3.1*  CL 112* 110 112*  CO2 25 25 25   GLUCOSE 139* 114* 129*  BUN <5* <5* 6  CREATININE 0.82 0.83 0.86  CALCIUM 8.3* 8.4* 8.6*   Dg Chest Port 1 View  Result Date: 09/16/2016 CLINICAL DATA:  Fever. EXAM: PORTABLE CHEST 1 VIEW COMPARISON:  Radiographs of September 11, 2016. FINDINGS: Stable cardiomediastinal silhouette. No pneumothorax or pleural effusion is noted. Both lungs are clear. The visualized skeletal structures are unremarkable. IMPRESSION: No acute cardiopulmonary abnormality seen. Electronically Signed   By: September 13, 2016, M.D.   On: 09/16/2016 09:34    Assessment / Plan:  1. 76 yo male with chest pain /  chronic dysphagia and significant weight loss. Manometry suspicious for achalasia. EGD revealed dilated proximal esophagus, spastic LES. Esophagus balloon dilated yesterday. He was able to complete breakfast this am without any difficulty.  -continue Diltiazem 60 mg TID for esophageal spasm.  --our office will call him with a follow up appt  2. Bilateral PE, on Xarelto -can restart Xarelto today  3. Hypokalemia, K+ 3.1. Replacement per primary team   Principal Problem:   Chest pain Active Problems:   Dysphagia   Hypokalemia    LOS: 3 days   61 NP 09/16/2016, 11:13 AM  Pager number [phone]

## 2016-09-16 NOTE — Progress Notes (Addendum)
PROGRESS NOTE    Andrew Winters  WUJ:[id number] DOB: May 20, 1941 DOA: 09/11/2016 PCP: Crist Fat, MD  Outpatient Specialists:   [facility] Dr. Dede Query Dr. Rhea Belton  Brief Narrative:  76 year old male Prior postop LUE DVT subsequent to total knee arthroplasty 6.28 2016-stay@ [facility] rehabilitation after  massive pulmonary embolus 05/2016 Severe dementia Reflux-has had endoscopies 96, 01, O2 3, 06, 12-noted on last episode to prebyesophagus with spasm of lower splinchter--he has been told in the past he would not benefit from repeat dilatation and was placed on dysphagia diet and recommended Reglan  IBD mellitus type 2 Obesity HTN   Assessment & Plan:   Principal Problem:   Chest pain Active Problems:   Dysphagia   Hypokalemia   On admission found to have hypokalemia 2. LFTs normal range Point of care troponin negative however troponin 0.03 and this stayed stable and admitted for chest pain rule out   Chest pain rule out-troponin trend is flat. EKG from this morning benign--unlikely ACS related to dysphagia- Manometry on 3/7, egd with balloon  Dilation on 3/8 per GI. Start ccb for lES relaxation Appreciate SLP (mild aspiration risk)   Fever: on 3/8 evening, he denies new complaints, but he is a very poor historian at baseline. ua with many bacteria, large leukocytes esterase, wbc TNTC, he denies urinary tract symptoms, wife report past history of uti required hospitalization,  Start rocephin, awaiting urine culture. Blood culture in process, cxr no acute findings  HTN Lisinopril 10 qd D/c norvasc, changed to cardizem due to gi benefit   Hypokalemia : replace, hold lasix for now. D/c ivf  noninsulin dependent Diabetes mellitus: hold home oral meds, on ssi here, monitor-blood sugars ranging from 90-100 a1c 6.2 May not be a good candidate for metfromin due to chronic diarrhea  History of submassive PE and DVT in the past-continue Xarelto  Dementia-per  wife, patient has been having memory issues in the past, he was on aricept which has helped ,but he was taken off aricept a few months ago for unclear reason, wife wanted to restart patient on dementia meds, I have reviewed chart, patient has tendency to have bradycardia, will avoid aricept, will try namenda  Chronic Diarrhea with watery stool: c diff negative Avoid metformin  Obesity: Body mass index is 37.22 kg/m. Report recent weight loss of of 6pounds (2% body weight) in 1 week which is significant for time frame. Meet severe malnutrition due to acute illness ( decreased oral intake due to dysphagia)   DVT prophylaxis: NOACs Code Status: FUll Family Communication: patient and wife at bedside Disposition Plan: SNF once fever free, culture resulted   Consultants:   GI  Procedures:   Manometry on 3/7  egd on 3/8  Antimicrobials:   Rocephin from 3/9   Subjective:  he is a poor historian Smiling, denies pain Wife in room  Objective: Vitals:   09/15/16 1255 09/15/16 2016 09/15/16 2226 09/16/16 0545  BP: (!) 149/72 118/61  116/67  Pulse: 70 70  64  Resp: 20 18  18   Temp: 98.9 F (37.2 C) (!) 101.1 F (38.4 C) 98.3 F (36.8 C) 97.7 F (36.5 C)  TempSrc: Oral Oral Oral Oral  SpO2: 96% 96%  97%  Weight:      Height:        Intake/Output Summary (Last 24 hours) at 09/16/16 1347 Last data filed at 09/16/16 1142  Gross per 24 hour  Intake  480 ml  Output              401 ml  Net               79 ml   Filed Weights   09/11/16 0801 09/11/16 1711  Weight: 118.4 kg (261 lb) 116 kg (255 lb 11.7 oz)    Examination:  General exam: slight confusion, intermittent agitation-needed breifly restraints Respiratory system: Clear to auscultation. Respiratory effort normal. No wheeze added sound Cardiovascular system: S1 & S2 heard, RRR. No JVD,, M/R/G- No pedal edema. Gastrointestinal system: Abdomen is nondistended, soft  Central nervous system: Alert and  oriented. No focal neurological deficits. Extremities: Symmetric 5 x 5 power. Skin: No rashes, lesions or ulcers Psychiatry: Judgement and insight appear normal. Mood & affect appropriate.     Data Reviewed: I have personally reviewed following labs and imaging studies  CBC:  Recent Labs Lab 09/11/16 0856 09/16/16 0552  WBC 9.7 5.4  HGB 10.5* 10.4*  HCT 33.7* 33.8*  MCV 83.8 83.7  PLT 179 235   Basic Metabolic Panel:  Recent Labs Lab 09/12/16 0715 09/13/16 0534 09/14/16 0535 09/15/16 0544 09/16/16 0552  NA 146* 148* 142 141 142  K 3.2* 3.4* 3.6 3.6 3.1*  CL 113* 115* 112* 110 112*  CO2 25 26 25 25 25   GLUCOSE 104* 110* 139* 114* 129*  BUN 6 <5* <5* <5* 6  CREATININE 0.91 0.81 0.82 0.83 0.86  CALCIUM 8.4* 8.4* 8.3* 8.4* 8.6*  MG  --  1.9  --  1.8 1.9   GFR: Estimated Creatinine Clearance: 93.2 mL/min (by C-G formula based on SCr of 0.86 mg/dL). Liver Function Tests:  Recent Labs Lab 09/11/16 0856 09/12/16 0715 09/13/16 0534  AST 15 17 18   ALT 11* 9* 12*  ALKPHOS 53 53 49  BILITOT 0.7 0.7 0.6  PROT 6.4* 6.4* 6.3*  ALBUMIN 3.1* 2.9* 3.1*   No results for input(s): LIPASE, AMYLASE in the last 168 hours. No results for input(s): AMMONIA in the last 168 hours. Coagulation Profile: No results for input(s): INR, PROTIME in the last 168 hours. Cardiac Enzymes:  Recent Labs Lab 09/11/16 2021 09/11/16 2259 09/12/16 0715  TROPONINI 0.03* 0.03* <0.03   BNP (last 3 results) No results for input(s): PROBNP in the last 8760 hours. HbA1C: No results for input(s): HGBA1C in the last 72 hours. CBG:  Recent Labs Lab 09/15/16 2011 09/16/16 0020 09/16/16 0413 09/16/16 0741 09/16/16 1115  GLUCAP 98 107* 132* 108* 115*   Lipid Profile: No results for input(s): CHOL, HDL, LDLCALC, TRIG, CHOLHDL, LDLDIRECT in the last 72 hours. Thyroid Function Tests: No results for input(s): TSH, T4TOTAL, FREET4, T3FREE, THYROIDAB in the last 72 hours. Anemia Panel: No  results for input(s): VITAMINB12, FOLATE, FERRITIN, TIBC, IRON, RETICCTPCT in the last 72 hours. Urine analysis:    Component Value Date/Time   COLORURINE YELLOW 09/16/2016 0858   APPEARANCEUR HAZY (A) 09/16/2016 0858   LABSPEC 1.010 09/16/2016 0858   PHURINE 6.0 09/16/2016 0858   GLUCOSEU NEGATIVE 09/16/2016 0858   HGBUR NEGATIVE 09/16/2016 0858   BILIRUBINUR NEGATIVE 09/16/2016 0858   BILIRUBINUR negative 10/08/2015 1925   KETONESUR NEGATIVE 09/16/2016 0858   PROTEINUR NEGATIVE 09/16/2016 0858   UROBILINOGEN 1.0 10/08/2015 1925   UROBILINOGEN 0.2 01/07/2015 2223   NITRITE NEGATIVE 09/16/2016 0858   LEUKOCYTESUR LARGE (A) 09/16/2016 0858   Sepsis Labs: @LABRCNTIP (procalcitonin:4,lacticidven:4)  ) Recent Results (from the past 240 hour(s))  C difficile quick scan w PCR reflex  Status: None   Collection Time: 09/14/16  9:28 AM  Result Value Ref Range Status   C Diff antigen NEGATIVE NEGATIVE Final   C Diff toxin NEGATIVE NEGATIVE Final   C Diff interpretation No C. difficile detected.  Final  MRSA PCR Screening     Status: None   Collection Time: 09/16/16  9:10 AM  Result Value Ref Range Status   MRSA by PCR NEGATIVE NEGATIVE Final    Comment:        The GeneXpert MRSA Assay (FDA approved for NASAL specimens only), is one component of a comprehensive MRSA colonization surveillance program. It is not intended to diagnose MRSA infection nor to guide or monitor treatment for MRSA infections.          Radiology Studies: Dg Chest Port 1 View  Result Date: 09/16/2016 CLINICAL DATA:  Fever. EXAM: PORTABLE CHEST 1 VIEW COMPARISON:  Radiographs of September 11, 2016. FINDINGS: Stable cardiomediastinal silhouette. No pneumothorax or pleural effusion is noted. Both lungs are clear. The visualized skeletal structures are unremarkable. IMPRESSION: No acute cardiopulmonary abnormality seen. Electronically Signed   By: Lupita Raider, M.D.   On: 09/16/2016 09:34         Scheduled Meds: . atorvastatin  40 mg Oral QHS  . cholecalciferol  1,000 Units Oral BID  . diltiazem  60 mg Oral Q8H  . famotidine  20 mg Oral Daily  . ferrous sulfate  325 mg Oral Q breakfast  . insulin aspart  0-15 Units Subcutaneous Q4H  . lisinopril  10 mg Oral Daily  . loratadine  10 mg Oral Daily  . potassium chloride  10 mEq Oral Daily  . potassium chloride  40 mEq Oral Q4H  . rivaroxaban  20 mg Oral Q supper  . sertraline  25 mg Oral Daily  . sodium chloride flush  3 mL Intravenous Q12H  . traMADol  50 mg Oral Once  . traZODone  100 mg Oral QHS  . vitamin E  400 Units Oral Daily   Continuous Infusions:    LOS: 3 days    Time spent: 59    Albertine Grates, MD PhD [facility] ([phone]   If 7PM-7AM, please contact night-coverage [email or web address] Password [email or web address] 09/16/2016, 1:47 PM

## 2016-09-17 DIAGNOSIS — I10 Essential (primary) hypertension: Secondary | ICD-10-CM | POA: Diagnosis not present

## 2016-09-17 DIAGNOSIS — E43 Unspecified severe protein-calorie malnutrition: Secondary | ICD-10-CM | POA: Diagnosis not present

## 2016-09-17 DIAGNOSIS — R1310 Dysphagia, unspecified: Secondary | ICD-10-CM | POA: Diagnosis not present

## 2016-09-17 DIAGNOSIS — E876 Hypokalemia: Secondary | ICD-10-CM | POA: Diagnosis not present

## 2016-09-17 DIAGNOSIS — N390 Urinary tract infection, site not specified: Secondary | ICD-10-CM | POA: Diagnosis not present

## 2016-09-17 DIAGNOSIS — R1312 Dysphagia, oropharyngeal phase: Secondary | ICD-10-CM | POA: Diagnosis not present

## 2016-09-17 DIAGNOSIS — R634 Abnormal weight loss: Secondary | ICD-10-CM | POA: Diagnosis not present

## 2016-09-17 DIAGNOSIS — E1140 Type 2 diabetes mellitus with diabetic neuropathy, unspecified: Secondary | ICD-10-CM | POA: Diagnosis not present

## 2016-09-17 DIAGNOSIS — I2692 Saddle embolus of pulmonary artery without acute cor pulmonale: Secondary | ICD-10-CM | POA: Diagnosis not present

## 2016-09-17 DIAGNOSIS — E785 Hyperlipidemia, unspecified: Secondary | ICD-10-CM | POA: Diagnosis not present

## 2016-09-17 DIAGNOSIS — R0789 Other chest pain: Secondary | ICD-10-CM | POA: Diagnosis not present

## 2016-09-17 DIAGNOSIS — M6281 Muscle weakness (generalized): Secondary | ICD-10-CM | POA: Diagnosis not present

## 2016-09-17 DIAGNOSIS — R079 Chest pain, unspecified: Secondary | ICD-10-CM | POA: Diagnosis not present

## 2016-09-17 DIAGNOSIS — Z7901 Long term (current) use of anticoagulants: Secondary | ICD-10-CM | POA: Diagnosis not present

## 2016-09-17 DIAGNOSIS — Z86718 Personal history of other venous thrombosis and embolism: Secondary | ICD-10-CM | POA: Diagnosis not present

## 2016-09-17 DIAGNOSIS — K210 Gastro-esophageal reflux disease with esophagitis: Secondary | ICD-10-CM | POA: Diagnosis not present

## 2016-09-17 DIAGNOSIS — Z7984 Long term (current) use of oral hypoglycemic drugs: Secondary | ICD-10-CM | POA: Diagnosis not present

## 2016-09-17 DIAGNOSIS — Z86711 Personal history of pulmonary embolism: Secondary | ICD-10-CM | POA: Diagnosis not present

## 2016-09-17 DIAGNOSIS — Z87898 Personal history of other specified conditions: Secondary | ICD-10-CM | POA: Diagnosis not present

## 2016-09-17 LAB — BASIC METABOLIC PANEL
Anion gap: 5 (ref 5–15)
BUN: 5 mg/dL — AB (ref 6–20)
CHLORIDE: 111 mmol/L (ref 101–111)
CO2: 25 mmol/L (ref 22–32)
Calcium: 8.6 mg/dL — ABNORMAL LOW (ref 8.9–10.3)
Creatinine, Ser: 0.92 mg/dL (ref 0.61–1.24)
GFR calc Af Amer: >60 mL/min (ref >60)
GFR calc non Af Amer: >60 mL/min (ref >60)
GLUCOSE: 130 mg/dL — AB (ref 65–99)
POTASSIUM: 3.8 mmol/L (ref 3.5–5.1)
Sodium: 141 mmol/L (ref 135–145)

## 2016-09-17 LAB — GLUCOSE, CAPILLARY
GLUCOSE-CAPILLARY: 148 mg/dL — AB (ref 65–99)
Glucose-Capillary: 107 mg/dL — ABNORMAL HIGH (ref 65–99)
Glucose-Capillary: 119 mg/dL — ABNORMAL HIGH (ref 65–99)
Glucose-Capillary: 105 mg/dL — ABNORMAL HIGH (ref 65–99)
Glucose-Capillary: 103 mg/dL — ABNORMAL HIGH (ref 65–99)

## 2016-09-17 LAB — MAGNESIUM: Magnesium: 2 mg/dL (ref 1.7–2.4)

## 2016-09-17 MED ORDER — MEMANTINE HCL 5 MG PO TABS: 5.0000 mg | ORAL_TABLET | Freq: Every day | ORAL | 0 refills | Status: DC

## 2016-09-17 MED ORDER — GABAPENTIN 100 MG PO CAPS: 100.0000 mg | ORAL_CAPSULE | Freq: Every day | ORAL | 0 refills | Status: DC

## 2016-09-17 MED ORDER — DILTIAZEM HCL 60 MG PO TABS: 60.0000 mg | ORAL_TABLET | Freq: Three times a day (TID) | ORAL | 0 refills | Status: DC

## 2016-09-17 MED ORDER — CEPHALEXIN 500 MG PO CAPS: 500.0000 mg | ORAL_CAPSULE | Freq: Two times a day (BID) | ORAL | 0 refills | Status: AC

## 2016-09-17 MED ORDER — FUROSEMIDE 20 MG PO TABS: 20.0000 mg | ORAL_TABLET | ORAL | 0 refills | Status: DC

## 2016-09-17 NOTE — Clinical Social Work Placement (Signed)
Patient is set to discharge to [facility] today. Patient & wife, Andrew Winters aware. Discharge packet given to RN, [facility] called for transport.     Lincoln Maxin, LCSW [facility] Clinical Social Worker cell #: [phone]    CLINICAL SOCIAL WORK PLACEMENT  NOTE  Date:  09/17/2016  Patient Details  Name: Andrew Winters MRN: [id number] Date of Birth: 25-Mar-1941  Clinical Social Work is seeking post-discharge placement for this patient at the Skilled  Nursing Facility level of care (*CSW will initial, date and re-position this form in  chart as items are completed):  Yes   Patient/family provided with [facility] Clinical Social Work Department's list of facilities offering this level of care within the geographic area requested by the patient (or if unable, by the patient's family).  Yes   Patient/family informed of their freedom to choose among providers that offer the needed level of care, that participate in Medicare, Medicaid or managed care program needed by the patient, have an available bed and are willing to accept the patient.  Yes   Patient/family informed of [facility]'s ownership interest in [facility] and [facility], as well as of the fact that they are under no obligation to receive care at these facilities.  PASRR submitted to EDS on       PASRR number received on       Existing PASRR number confirmed on 09/13/16     FL2 transmitted to all facilities in geographic area requested by pt/family on 09/13/16     FL2 transmitted to all facilities within larger geographic area on 09/13/16     Patient informed that his/her managed care company has contracts with or will negotiate with certain facilities, including the following:        Yes   Patient/family informed of bed offers received.  Patient chooses bed at [facility]     Physician recommends and patient chooses bed at      Patient to be transferred to  [facility] on 09/17/16.  Patient to be transferred to facility by [facility]     Patient family notified on 09/17/16 of transfer.  Name of family member notified:  patient's wife Andrew Winters via phone     PHYSICIAN       Additional Comment:    _______________________________________________ Arlyss Repress, LCSW 09/17/2016, 12:38 PM

## 2016-09-17 NOTE — Progress Notes (Signed)
NCM spoke to pt and wife at bedside. Pt has a desire to change PCP. Wanted information on [facility]. Provided wife with brochure contact info and list of PCP's for that group. Isidoro Donning RN CCM Case Mgmt phone [phone]

## 2016-09-17 NOTE — Discharge Summary (Addendum)
Discharge Summary  Andrew Winters ENM:[id number] DOB: 12-28-1940  PCP: Crist Fat, MD  Admit date: 09/11/2016 Discharge date: 09/17/2016  Time spent: >79mins  Recommendations for Outpatient Follow-up:  1. F/u with SNF MD for hospital discharge follow up, repeat cbc/bmp at follow up 2. SNF MD follow up on final urine culture result that was done on 3/9 3. F/u with [facility] for EGJ outflow obstruction s/p dilation  Discharge Diagnoses:  Active Hospital Problems   Diagnosis Date Noted  . Chest pain 09/11/2016  . Urinary tract infection without hematuria   . Vascular dementia without behavioral disturbance   . Hypokalemia   . Dysphagia     Resolved Hospital Problems   Diagnosis Date Noted Date Resolved  No resolved problems to display.    Discharge Condition: stable  Diet recommendation: heart healthy/carb modified  Filed Weights   09/11/16 0801 09/11/16 1711  Weight: 118.4 kg (261 lb) 116 kg (255 lb 11.7 oz)    History of present illness:  Chief Complaint: Chest pain   HPI: Andrew Winters is a 76 y.o. male with medical history significant for but not limited to recent PE in November 2017 on Xarelto, type 2 diabetes, hypertension and hyperlipidemia  He presented to the [facility] ED with his wife for 2 day history of sternal nonradiating chest pain described as sharp, 7-8/10, not necessarily aggravated by exertion or relieved by rest, lasting for several hours.Denies fever, chills, cough or sputum production, rhinorrhea, myalgia.  He was discharged from the hospital on 05/24/2016 after admission for chest pain on 05/20/2013 at which time ACS was ruled out and had an unremarkable echocardiogram.  Patient has long-standing problems with dysphagia and had been seen Dr. Juanda Chance previously of [facility], and currently seeing a new doctor Dr. Maurine Minister. He has had esophageal interventions including stretching and manometry and is currently set up for another  procedure soon.  Wife intimates that since January 2018 patient has continued to be weak with low poor intake and has been feeding him with pure diet because anytime he drinks or eats liquid or solid foods respectively "it comes right back up". He has actually lost about 40 pounds according to the wife.  He currently lives with his wife at home by themselves and has been a little difficult to take care of him by herself due to his progressive weakness.      ED Course: At the ED patient was hemodynamically stable with O2 saturation of 95%. 12-lead EKG was negative for acute ischemic changes, and CT scan was negative for PE. He was given potassium for his hypokalemia. Hospitalist was consulted for admission  Hospital Course:  Principal Problem:   Chest pain Active Problems:   Dysphagia   Hypokalemia   Urinary tract infection without hematuria   Vascular dementia without behavioral disturbance   Chest pain /dysphagia  SLP (mild aspiration risk) troponin 0.03, 0.03, 0.03, flat. EKG  benign--unlikely ACS Chest pain likely due to dysphagia- Manometry on 3/7 showed EGJ outflow obstruction, not achalasia ,  EGD without focal stricture or mass lesion, only hypertensive LES, s/p balloon  Dilation on 3/8 .  Start diltiazem to held reduce esophageal spasm. Continue follow with GI.    Fever: one time fever of 101 on 3/8 evening, he denies new complaints, but he is a very poor historian at baseline. ua with many bacteria, large leukocytes esterase, wbc TNTC, he denies urinary tract symptoms, wife report patient with past history of uti required hospitalization,  Start rocephin, awaiting urine culture. Blood culture no growth, cxr no acute findings He is discharged on keflex for  Total of 5 days to cover UTI, SNF MD to follow up on final urine culture result. 3/12 addendum: urine culture + klebsiella penumoniae than is sensitive to keflex.  HTN Lisinopril 10 qd D/c norvasc, changed to  cardizem due to gi benefit   Hypokalemia : replace,  home lasix held in the hospital, resumed at decreased frequency at discharge ( changed from daily to mwf)  noninsulin dependent Diabetes mellitus:  hold home oral meds, on ssi here, monitor-blood sugars ranging from 90-100 a1c 6.2 May not be a good candidate for metfromin due to chronic diarrhea Home meds amaryl resumed at discharge.  History of submassive PE and DVT in the past-continue Xarelto  Dementia-per wife, patient has been having memory issues in the past, he was on aricept which has helped ,but he was taken off aricept a few months ago for unclear reason, wife wanted to restart patient on dementia meds, I have reviewed chart, patient has tendency to have bradycardia, will avoid aricept, will try namenda  Chronic Diarrhea with watery stool: c diff negative Avoid metformin  Obesity: Body mass index is 37.22 kg/m. Per wife, patient possible has signs of sleep apnea, likely will benefit from outpatient sleep study.  Report recent weight loss of of 6pounds (2% body weight) in 1 week which is significant for time frame. Meet severe malnutrition due to acute illness ( decreased oral intake due to dysphagia)   DVT prophylaxis: NOACs Code Status: FUll Family Communication: patient and wife at bedside Disposition Plan: SNF on 3/10   Consultants:   GI  Procedures:   Manometry on 3/7  egd on 3/8  Antimicrobials:   Rocephin from 3/9    Discharge Exam: BP 124/64   Pulse 69   Temp 97.9 F (36.6 C) (Oral)   Resp 18   Ht 5\' 9"  (1.753 m)   Wt 116 kg (255 lb 11.7 oz)   SpO2 96%   BMI 37.22 kg/m   General: frail, very slow in speech, baseline dementia, very poor historian Cardiovascular: RRR Respiratory: diminished, no wheezing, no rales, no rhonchi  Discharge Instructions You were cared for by a hospitalist during your hospital stay. If you have any questions about your discharge medications or  the care you received while you were in the hospital after you are discharged, you can call the unit and asked to speak with the hospitalist on call if the hospitalist that took care of you is not available. Once you are discharged, your primary care physician will handle any further medical issues. Please note that NO REFILLS for any discharge medications will be authorized once you are discharged, as it is imperative that you return to your primary care physician (or establish a relationship with a primary care physician if you do not have one) for your aftercare needs so that they can reassess your need for medications and monitor your lab values.  Discharge Instructions    Diet - low sodium heart healthy    Complete by:  As directed    Carb modified   Increase activity slowly    Complete by:  As directed      Allergies as of 09/17/2016      Reactions   Aspirin Diarrhea      Medication List    STOP taking these medications   amLODipine 5 MG tablet Commonly known as:  NORVASC  docusate sodium 100 MG capsule Commonly known as:  COLACE   metFORMIN 500 MG 24 hr tablet Commonly known as:  GLUCOPHAGE-XR     TAKE these medications   acetaminophen 500 MG tablet Commonly known as:  TYLENOL Take 1,000 mg by mouth every 6 (six) hours as needed for mild pain, moderate pain or headache.   atorvastatin 40 MG tablet Commonly known as:  LIPITOR Take 40 mg by mouth at bedtime.   cephALEXin 500 MG capsule Commonly known as:  KEFLEX Take 1 capsule (500 mg total) by mouth 2 (two) times daily.   cetirizine 10 MG tablet Commonly known as:  ZYRTEC Take 10 mg by mouth daily.   cholecalciferol 1000 units tablet Commonly known as:  VITAMIN D Take 1,000 Units by mouth 2 (two) times daily.   diltiazem 60 MG tablet Commonly known as:  CARDIZEM Take 1 tablet (60 mg total) by mouth every 8 (eight) hours.   ferrous sulfate 325 (65 FE) MG tablet Take 325 mg by mouth daily with breakfast.     furosemide 20 MG tablet Commonly known as:  LASIX Take 1 tablet (20 mg total) by mouth every Monday, Wednesday, and Friday. Start taking on:  09/19/2016 What changed:  when to take this   gabapentin 100 MG capsule Commonly known as:  NEURONTIN Take 1 capsule (100 mg total) by mouth at bedtime. What changed:  when to take this   glimepiride 4 MG tablet Commonly known as:  AMARYL Take 4 mg by mouth 2 (two) times daily.   guaifenesin 400 MG Tabs tablet Commonly known as:  HUMIBID E Take 400 mg by mouth every 6 (six) hours as needed (for cough and congestion).   lisinopril 10 MG tablet Commonly known as:  PRINIVIL,ZESTRIL Take 10 mg by mouth daily.   memantine 5 MG tablet Commonly known as:  NAMENDA Take 1 tablet (5 mg total) by mouth daily. Start taking on:  09/18/2016   omeprazole 20 MG tablet Commonly known as:  PRILOSEC OTC Take 20 mg by mouth 2 (two) times daily.   potassium chloride 10 MEQ tablet Commonly known as:  K-DUR,KLOR-CON Take 10 mEq by mouth daily.   ranitidine 150 MG tablet Commonly known as:  ZANTAC Take 150 mg by mouth at bedtime.   rivaroxaban 20 MG Tabs tablet Commonly known as:  XARELTO Take 1 tablet (20 mg total) by mouth daily with supper. Take with food starting 06/15/2016 What changed:  when to take this  additional instructions   sertraline 25 MG tablet Commonly known as:  ZOLOFT Take 25 mg by mouth daily.   traMADol 50 MG tablet Commonly known as:  ULTRAM Take 50 mg by mouth every 8 (eight) hours as needed for moderate pain.   traZODone 50 MG tablet Commonly known as:  DESYREL Take 50 mg by mouth at bedtime.   vitamin E 400 UNIT capsule Take 400 Units by mouth daily.      Allergies  Allergen Reactions  . Aspirin Diarrhea    Contact information for follow-up providers    Marsa Aris, MD Follow up in 3 week(s).   Specialty:  Gastroenterology Contact information: [address] [place]  [id number] [phone]            Contact information for after-discharge care    Destination    [facility] Follow up.   Specialty:  Skilled Nursing Facility Contact information: [address] [place] [address] [phone]  The results of significant diagnostics from this hospitalization (including imaging, microbiology, ancillary and laboratory) are listed below for reference.    Significant Diagnostic Studies: Dg Chest 2 View  Result Date: 09/11/2016 CLINICAL DATA:  Patient reports SOB since yesterday. Pt has no other chest complaints. PT Hx: diabetes, HTN, pneumonia, ex smoker 2016 EXAM: CHEST  2 VIEW COMPARISON:  05/20/2016 FINDINGS: The heart is enlarged. There is elevation of right hemidiaphragm, stable in appearance. There are no focal consolidations or pleural effusions. No pulmonary edema. IMPRESSION: Stable cardiomegaly.  No evidence for acute pulmonary abnormality. Electronically Signed   By: Norva Pavlov M.D.   On: 09/11/2016 08:54   Ct Angio Chest Pe W Or Wo Contrast  Result Date: 09/11/2016 CLINICAL DATA:  Chest pain. EXAM: CT ANGIOGRAPHY CHEST WITH CONTRAST TECHNIQUE: Multidetector CT imaging of the chest was performed using the standard protocol during bolus administration of intravenous contrast. Multiplanar CT image reconstructions and MIPs were obtained to evaluate the vascular anatomy. CONTRAST:  100 mL of Isovue 370 intravenously. COMPARISON:  CT scan of May 20, 2016. FINDINGS: Cardiovascular: Satisfactory opacification of the pulmonary arteries to the segmental level. No evidence of pulmonary embolism. Normal heart size. No pericardial effusion. Atherosclerosis of thoracic aorta is noted without aneurysm. Mediastinum/Nodes: No enlarged mediastinal, hilar, or axillary lymph nodes. Thyroid gland, trachea, and esophagus demonstrate no significant findings. Lungs/Pleura: Minimal right apical  scarring is noted. No pneumothorax or pleural effusion is noted. 5 mm nodule is noted in left lung apex. Upper Abdomen: No acute abnormality. Musculoskeletal: No chest wall abnormality. No acute or significant osseous findings. Review of the MIP images confirms the above findings. IMPRESSION: No definite evidence of pulmonary embolus. Aortic atherosclerosis. 5 mm left apical nodule is noted. No follow-up needed if patient is low-risk. Non-contrast chest CT can be considered in 12 months if patient is high-risk. This recommendation follows the consensus statement: Guidelines for Management of Incidental Pulmonary Nodules Detected on CT Images: From the Fleischner Society 2017; Radiology 2017; 284:228-243. Electronically Signed   By: Lupita Raider, M.D.   On: 09/11/2016 10:44   Dg Chest Port 1 View  Result Date: 09/16/2016 CLINICAL DATA:  Fever. EXAM: PORTABLE CHEST 1 VIEW COMPARISON:  Radiographs of September 11, 2016. FINDINGS: Stable cardiomediastinal silhouette. No pneumothorax or pleural effusion is noted. Both lungs are clear. The visualized skeletal structures are unremarkable. IMPRESSION: No acute cardiopulmonary abnormality seen. Electronically Signed   By: Lupita Raider, M.D.   On: 09/16/2016 09:34    Microbiology: Recent Results (from the past 240 hour(s))  C difficile quick scan w PCR reflex     Status: None   Collection Time: 09/14/16  9:28 AM  Result Value Ref Range Status   C Diff antigen NEGATIVE NEGATIVE Final   C Diff toxin NEGATIVE NEGATIVE Final   C Diff interpretation No C. difficile detected.  Final  MRSA PCR Screening     Status: None   Collection Time: 09/16/16  9:10 AM  Result Value Ref Range Status   MRSA by PCR NEGATIVE NEGATIVE Final    Comment:        The GeneXpert MRSA Assay (FDA approved for NASAL specimens only), is one component of a comprehensive MRSA colonization surveillance program. It is not intended to diagnose MRSA infection nor to guide or monitor  treatment for MRSA infections.   Culture, blood (routine x 2)     Status: None (Preliminary result)   Collection Time: 09/16/16  9:57 AM  Result  Value Ref Range Status   Specimen Description BLOOD LEFT ARM  Final   Special Requests IN PEDIATRIC BOTTLE 1CC  Final   Culture   Final    NO GROWTH < 24 HOURS Performed at [facility], [address], [place], [place] [address]    Report Status PENDING  Incomplete  Culture, blood (routine x 2)     Status: None (Preliminary result)   Collection Time: 09/16/16  9:59 AM  Result Value Ref Range Status   Specimen Description BLOOD RIGHT WRIST  Final   Special Requests IN PEDIATRIC BOTTLE 3CC  Final   Culture   Final    NO GROWTH < 24 HOURS Performed at [facility], [address], [place], [place] [address]    Report Status PENDING  Incomplete     Labs: Basic Metabolic Panel:  Recent Labs Lab 09/13/16 0534 09/14/16 0535 09/15/16 0544 09/16/16 0552 09/17/16 0534  NA 148* 142 141 142 141  K 3.4* 3.6 3.6 3.1* 3.8  CL 115* 112* 110 112* 111  CO2 26 25 25 25 25   GLUCOSE 110* 139* 114* 129* 130*  BUN <5* <5* <5* 6 5*  CREATININE 0.81 0.82 0.83 0.86 0.92  CALCIUM 8.4* 8.3* 8.4* 8.6* 8.6*  MG 1.9  --  1.8 1.9 2.0   Liver Function Tests:  Recent Labs Lab 09/11/16 0856 09/12/16 0715 09/13/16 0534  AST 15 17 18   ALT 11* 9* 12*  ALKPHOS 53 53 49  BILITOT 0.7 0.7 0.6  PROT 6.4* 6.4* 6.3*  ALBUMIN 3.1* 2.9* 3.1*   No results for input(s): LIPASE, AMYLASE in the last 168 hours. No results for input(s): AMMONIA in the last 168 hours. CBC:  Recent Labs Lab 09/11/16 0856 09/16/16 0552  WBC 9.7 5.4  HGB 10.5* 10.4*  HCT 33.7* 33.8*  MCV 83.8 83.7  PLT 179 235   Cardiac Enzymes:  Recent Labs Lab 09/11/16 2021 09/11/16 2259 09/12/16 0715  TROPONINI 0.03* 0.03* <0.03   BNP: BNP (last 3 results) No results for input(s): BNP in the last 8760 hours.  ProBNP (last 3 results) No results for input(s):  PROBNP in the last 8760 hours.  CBG:  Recent Labs Lab 09/16/16 1623 09/16/16 2248 09/17/16 0238 09/17/16 0558 09/17/16 0745  GLUCAP 110* 121* 107* 119* 148*       Signed:  Peggye Poon MD, PhD  [facility] 09/17/2016, 11:45 AM

## 2016-09-17 NOTE — Progress Notes (Signed)
Patient transported to the facility via EMS, discharge packet prepared by CSW and given to EMS for facility. Report called to facility. Skin intact, no wound noted. Wife at the bedside during transportation, patient in no pain/distress.

## 2016-09-18 ENCOUNTER — Encounter (HOSPITAL_COMMUNITY): Payer: Self-pay | Admitting: Gastroenterology

## 2016-09-18 LAB — URINE CULTURE: Culture: >=100000 — AB

## 2016-09-20 ENCOUNTER — Telehealth: Payer: Self-pay

## 2016-09-20 ENCOUNTER — Non-Acute Institutional Stay (SKILLED_NURSING_FACILITY): Payer: Medicare Other | Admitting: Internal Medicine

## 2016-09-20 ENCOUNTER — Encounter: Payer: Self-pay | Admitting: Internal Medicine

## 2016-09-20 DIAGNOSIS — R1319 Other dysphagia: Secondary | ICD-10-CM

## 2016-09-20 DIAGNOSIS — K529 Noninfective gastroenteritis and colitis, unspecified: Secondary | ICD-10-CM | POA: Insufficient documentation

## 2016-09-20 DIAGNOSIS — I10 Essential (primary) hypertension: Secondary | ICD-10-CM

## 2016-09-20 DIAGNOSIS — I2692 Saddle embolus of pulmonary artery without acute cor pulmonale: Secondary | ICD-10-CM | POA: Diagnosis not present

## 2016-09-20 DIAGNOSIS — R1310 Dysphagia, unspecified: Secondary | ICD-10-CM | POA: Diagnosis not present

## 2016-09-20 DIAGNOSIS — F0150 Vascular dementia without behavioral disturbance: Secondary | ICD-10-CM

## 2016-09-20 DIAGNOSIS — Z87898 Personal history of other specified conditions: Secondary | ICD-10-CM | POA: Diagnosis not present

## 2016-09-20 DIAGNOSIS — N390 Urinary tract infection, site not specified: Secondary | ICD-10-CM | POA: Diagnosis not present

## 2016-09-20 NOTE — Assessment & Plan Note (Signed)
Continue Xarelto despite GI issues

## 2016-09-20 NOTE — Assessment & Plan Note (Signed)
09/20/16 Increase Cardizem due to uncontrolled blood pressure

## 2016-09-20 NOTE — Assessment & Plan Note (Signed)
Continue PPI twice a day as well as daily at bedtime ranitidine as per GI

## 2016-09-20 NOTE — Assessment & Plan Note (Signed)
Complete Keflex for Klebsiella urinary tract infection

## 2016-09-20 NOTE — Assessment & Plan Note (Signed)
09/20/16 Florastor trial

## 2016-09-20 NOTE — Progress Notes (Signed)
Facility Location: [facility]  Room Number: 119  Code Status: Full Code   PCP: Crist Fat, MD [address] [place] [address]   This is a comprehensive admission note to [facility] performed on this date less than 30 days from date of admission. Included are preadmission medical/surgical history;reconciled medication list; family history; social history and comprehensive review of systems.  Corrections and additions to the records were documented . Comprehensive physical exam was also performed. Additionally a clinical summary was entered for each active diagnosis pertinent to this admission in the Problem List to enhance continuity of care.   HPI: The patient was hospitalized 3/4-3/10/18 with chest pain up to level VII-VIII context of long-standing dysphagia. Past history had included previous esophageal stretching and manometry. The patient had a saddle PTE in November 2017 and has been on Xarelto. His wife states that since January 2018 he's been profoundly weak with poor intake. He's been doing poorly even with pured diet. Patient clinically exhibited regurgitation of almost all oral intake. Apparently his wife states he lost 40 pounds during this time. This has been associated profound weakness &  she is  unable to care for him. Manometry 3/7 revealed EG J outflow obstruction. EGD 3/8 showed a hypertensive LES. Balloon dilation was pursued. Diltiazem was initiated for esophageal spasm and Norvasc discontinued. Hospital course was complicated by Klebsiella urinary tract infection for which he received Rocephin & then Keflex He was on sliding scale insulin the hospital with glucoses ranging 90-100. A1c was prediabetic at 6.2%. He also had chronic diarrhea with watery stool. C. difficile studies were negative. Metformin was not felt to be an option for his diabetes because the diarrhea.  His wife felt that he did improve on Aricept for  dementia. It is unsure why it was stopped. It was not reinitiated because of bradycardia. Namenda was initiated instead. His wife also questions sleep apnea. Evaluation was recommended after discharge from the SNF.  Past medical and surgical history: incredibly long and complicated. This includes rheumatoid arthritis, vitamin D deficiency, peripheral neuropathy, hyperthyroidism, anemia, and anxiety/depression.   Surgeries include an colonoscopy in addition to multiple EGDs   Social history: former smoker, does not drink alcohol   Family history: reviewed   Review of systems:Could not be completed due to dementia. Date given as  3/19/?. He was able to identify the president.  He seems to indicate he still has some dysphagia.  Physical exam:  Pertinent or positive findings: note blood pressure 186/84.  Pattern alopecia present ;he is unshaven. He has a mustache.  Dense arcus senilis is present. Bilateral ptosis is noted. There is slight exotropia of the right eye.  His affect is markedly flat. Speech is slurred and slightly garbled He has minor rales & rhonchi asymmetrically. There is no increased work of breathing.  Abdomen is protuberant with a ventral hernia. Trace - 1/2+ edema. He is symmetrically weak in all extremities. Has difficulty sitting up without help.   General appearance:Adequately nourished; no acute distress , increased work of breathing is present.   Lymphatic: No lymphadenopathy about the head, neck, axilla . Eyes: No conjunctival inflammation or lid edema is present. There is no scleral icterus. Ears:  External ear exam shows no significant lesions or deformities.   Nose:  External nasal examination shows no deformity or inflammation. Nasal mucosa are pink and moist without lesions ,exudates Oral exam: lips and gums are healthy appearing.There is no  oropharyngeal erythema or exudate . Neck:  No thyromegaly, masses, tenderness noted.    Heart:  Normal rate and regular  rhythm. S1 and S2 normal without gallop, murmur, click, rub .  Abdomen:Bowel sounds are normal. Abdomen is soft and nontender with no organomegaly, masses. GU: deferred as previously addressed. Extremities:  No cyanosis, clubbing  Neurologic exam : Strength equal  in upper & lower extremities Balance,Rhomberg,finger to nose testing could not be completed due to clinical state Skin: Warm & dry w/o tenting. No significant lesions or rash.  See clinical summary under each active problem in the Problem List with associated updated therapeutic plan

## 2016-09-20 NOTE — Telephone Encounter (Signed)
Called [facility] at [phone], spoke to nurse on [employer], gave her follow up appointment information. Patient has hospital follow up on 3/29 at 11:00 with Dr. Myrtie Neither.

## 2016-09-20 NOTE — Patient Instructions (Signed)
See assessment and plan under each diagnosis in the problem list and acutely for this visit 

## 2016-09-20 NOTE — Assessment & Plan Note (Signed)
Outpatient evaluation by PCP following discharge from the SNF

## 2016-09-20 NOTE — Assessment & Plan Note (Signed)
09/20/16 decrease Amaryl to 2 mg twice a day from 4 mg twice a day due to A1c of 6.2%

## 2016-09-21 ENCOUNTER — Encounter (HOSPITAL_COMMUNITY): Admission: RE | Payer: Self-pay | Source: Ambulatory Visit

## 2016-09-21 ENCOUNTER — Ambulatory Visit (HOSPITAL_COMMUNITY): Admission: RE | Admit: 2016-09-21 | Payer: Medicare Other | Source: Ambulatory Visit | Admitting: Gastroenterology

## 2016-09-21 LAB — CULTURE, BLOOD (ROUTINE X 2)
Culture: NO GROWTH
Culture: NO GROWTH

## 2016-09-21 SURGERY — MANOMETRY, ESOPHAGUS

## 2016-09-22 ENCOUNTER — Other Ambulatory Visit: Payer: Self-pay | Admitting: *Deleted

## 2016-09-22 NOTE — Patient Outreach (Signed)
[facility]) Care Management  09/22/2016  Jacier Gladu 02-27-1941 [id number]   Met with patient at bedside of facility.  He states his wife assists him and knows about his health.  Reviewed [facility] program with patient. He defers to her for any decisions. RNCM left [facility] care management brochure and contact for wife.   Plan to follow up as indicated.  Royetta Crochet. Laymond Purser, RN, BSN, [place] [phone]) Business Cell  [phone]) Toll Free Office

## 2016-10-06 ENCOUNTER — Ambulatory Visit (INDEPENDENT_AMBULATORY_CARE_PROVIDER_SITE_OTHER): Payer: Medicare Other | Admitting: Gastroenterology

## 2016-10-06 ENCOUNTER — Encounter: Payer: Self-pay | Admitting: Gastroenterology

## 2016-10-06 VITALS — BP 104/62 | HR 88 | Ht 69.5 in | Wt 253.0 lb

## 2016-10-06 DIAGNOSIS — R1310 Dysphagia, unspecified: Secondary | ICD-10-CM

## 2016-10-06 DIAGNOSIS — Z7901 Long term (current) use of anticoagulants: Secondary | ICD-10-CM | POA: Diagnosis not present

## 2016-10-06 DIAGNOSIS — R1319 Other dysphagia: Secondary | ICD-10-CM

## 2016-10-06 DIAGNOSIS — R634 Abnormal weight loss: Secondary | ICD-10-CM

## 2016-10-06 NOTE — Progress Notes (Signed)
[facility] Progress Note  Chief Complaint: Dysphagia and weight loss  Subjective  History:  This is a 76 year old man seen after recent hospital stay. He was seen by our PA in early January for chronic dysphagia, and a barium study had an achalasia-like picture. I saw him a month ago, and was planning for an esophageal motility study. He was then admitted for sepsis from urinary tract infection with altered mental status. GI consultation was obtained with Dr. Adela Lank for his dysphagia and weight loss. It was felt best to proceed with further testing while he was an inpatient. Upper endoscopy revealed a tight LES but without clear stricture. Motility study confirmed a hypertonic LES, but without diagnostic criteria of achalasia. He still had some peristalsis. The patient was taken off his Xarelto for 24 hours, and underwent repeat EGD with dilation of the LES to a 20 mm balloon, and then institution of diltiazem tablets. The patient has been in rehabilitation since then because of altered mental status and general deconditioning. His wife reports that she will be taking Imodium later today.  It is very difficult to tell what his symptoms might be right now. His wife says he has difficulty getting his gabapentin down because it is large, but otherwise she thinks he is getting his other medicines in without difficulty. As far she knows he is eating reasonably well, but thinks he may still be losing weight. Increased LES, got 42mm dilation, diltiazem 90 q 8 hrs Eli is apparently till not himself these days and while he is awake and somewhat conversational, really cannot provide helpful history or review of systems.  ROS: Cardiovascular:  no chest pain Respiratory: no dyspnea  The patient's Past Medical, Family and Social History were reviewed and are on file in the EMR.  Objective:  Med list reviewed  Vital signs in last 24 hrs: Vitals:   10/06/16 1135  BP: 104/62  Pulse:  88    Physical Exam    HEENT: sclera anicteric, oral mucosa moist without lesions  Neck: supple, no thyromegaly, JVD or lymphadenopathy  Cardiac: RRR without murmurs, S1S2 heard, no peripheral edema  Pulm: clear to auscultation bilaterally, normal RR and effort noted  Abdomen: soft, No tenderness, with active bowel sounds. No guarding or palpable hepatosplenomegaly.  Exam somewhat limited since he is confined to a wheelchair.  Motility report and EGD reports were reviewed. I personally discussed the case with Dr. Adela Lank and Dr. Lavon Paganini in anticipation of the patient's visit today.  @ASSESSMENTPLANBEGIN @ Assessment: Encounter Diagnoses  Name Primary?  . Esophageal dysphagia Yes  . Abnormal loss of weight   . Chronic anticoagulation     I think he is probably still having difficulty swallowing, but it is very difficult to characterize. His wife is taking him home today from rehabilitation, which means she can then observe him more closely. I would like her to call me in 2 weeks with an update. If he is having ongoing dysphagia and this definitely losing weight, and he will need a repeat EGD. I would probably have to do it at the hospital with him off Xarelto for 24 hours prior. I would make another attempt at balloon dilation. If that did not work based on recurrent symptoms and barium swallow findings, probably Botox injection would be next.  Mr. Beavin's wife is agreeable to the plan.  He will remain on diltiazem 90 mg 3 times a day in the meantime.  Total time 30 minutes, over half spent  in counseling and coordination of care.   Nelida Meuse III

## 2016-10-06 NOTE — Patient Instructions (Addendum)
Please call our office in 2 weeks with an update on his condition. Ask for Raynelle Fanning (nurse) [phone]  If you are age 76 or older, your body mass index should be between 23-30. Your Body mass index is 36.83 kg/m. If this is out of the aforementioned range listed, please consider follow up with your Primary Care Provider.  If you are age 62 or younger, your body mass index should be between 19-25. Your Body mass index is 36.83 kg/m. If this is out of the aformentioned range listed, please consider follow up with your Primary Care Provider.   Thank you for choosing [facility]  Dr Amada Jupiter III

## 2016-10-21 ENCOUNTER — Telehealth: Payer: Self-pay

## 2016-10-24 NOTE — Telephone Encounter (Signed)
Patient's wife called back, reports that patient is having no difficulty with swallow soft or pureed foods, but does have difficulty with water or thin liquids like this. Will come back up, then go down. He is still having "chest pain".

## 2016-10-26 NOTE — Telephone Encounter (Signed)
Do you want to add anything to what this patient is taking (thicket?) or other additional tests? Thank you.

## 2016-10-26 NOTE — Telephone Encounter (Signed)
He is going to need an outpatient EGD at [facility] for BoTox injection of the lower esophageal sphincter. Please see about some possible slots in the next few weeks.  Then I will need to figure out how to manage his Xarelto for the scope.  If they are agreeable, please start planning by seeing what is available.

## 2016-10-27 ENCOUNTER — Other Ambulatory Visit: Payer: Self-pay

## 2016-10-27 DIAGNOSIS — R1310 Dysphagia, unspecified: Secondary | ICD-10-CM

## 2016-10-27 NOTE — Telephone Encounter (Signed)
Patient's wife called back, she is aware that Dr. Myrtie Neither recommends EGD with botox injection. This is scheduled for 5/4 at [facility] 9:45 am, case #[id number]. I let her know that I would be mailing out the prep instructions, and discussing with her next week when her husband would need to stop the Xarelto.

## 2016-11-01 IMAGING — CR DG CHEST 2V
2 series · 2 of 2 positions shown · non-contrast
Comparison: July 03, 2011.

CLINICAL DATA: Hypertension, preop for right total knee
arthroplasty.

EXAM:
CHEST  2 VIEW

[w chest pa]
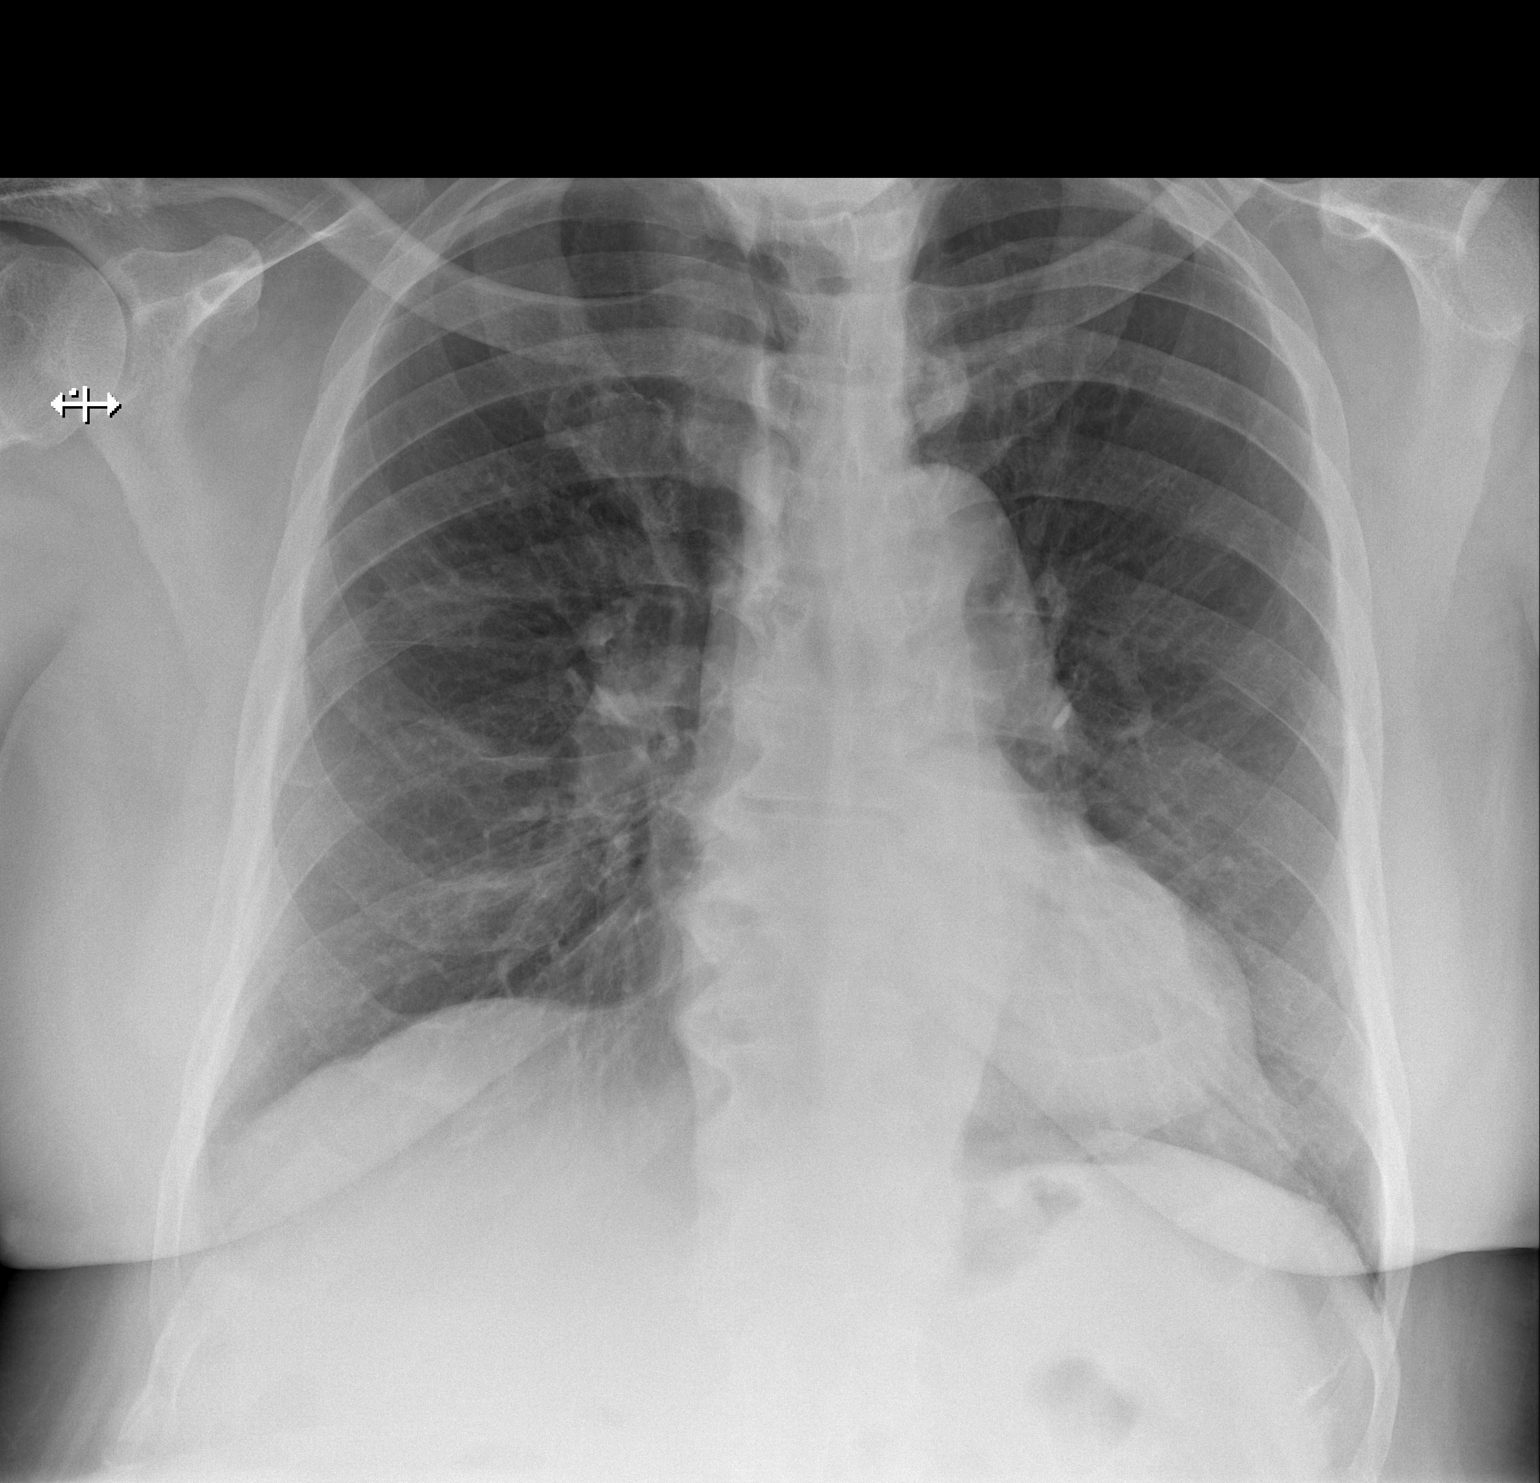

[w chest lat]
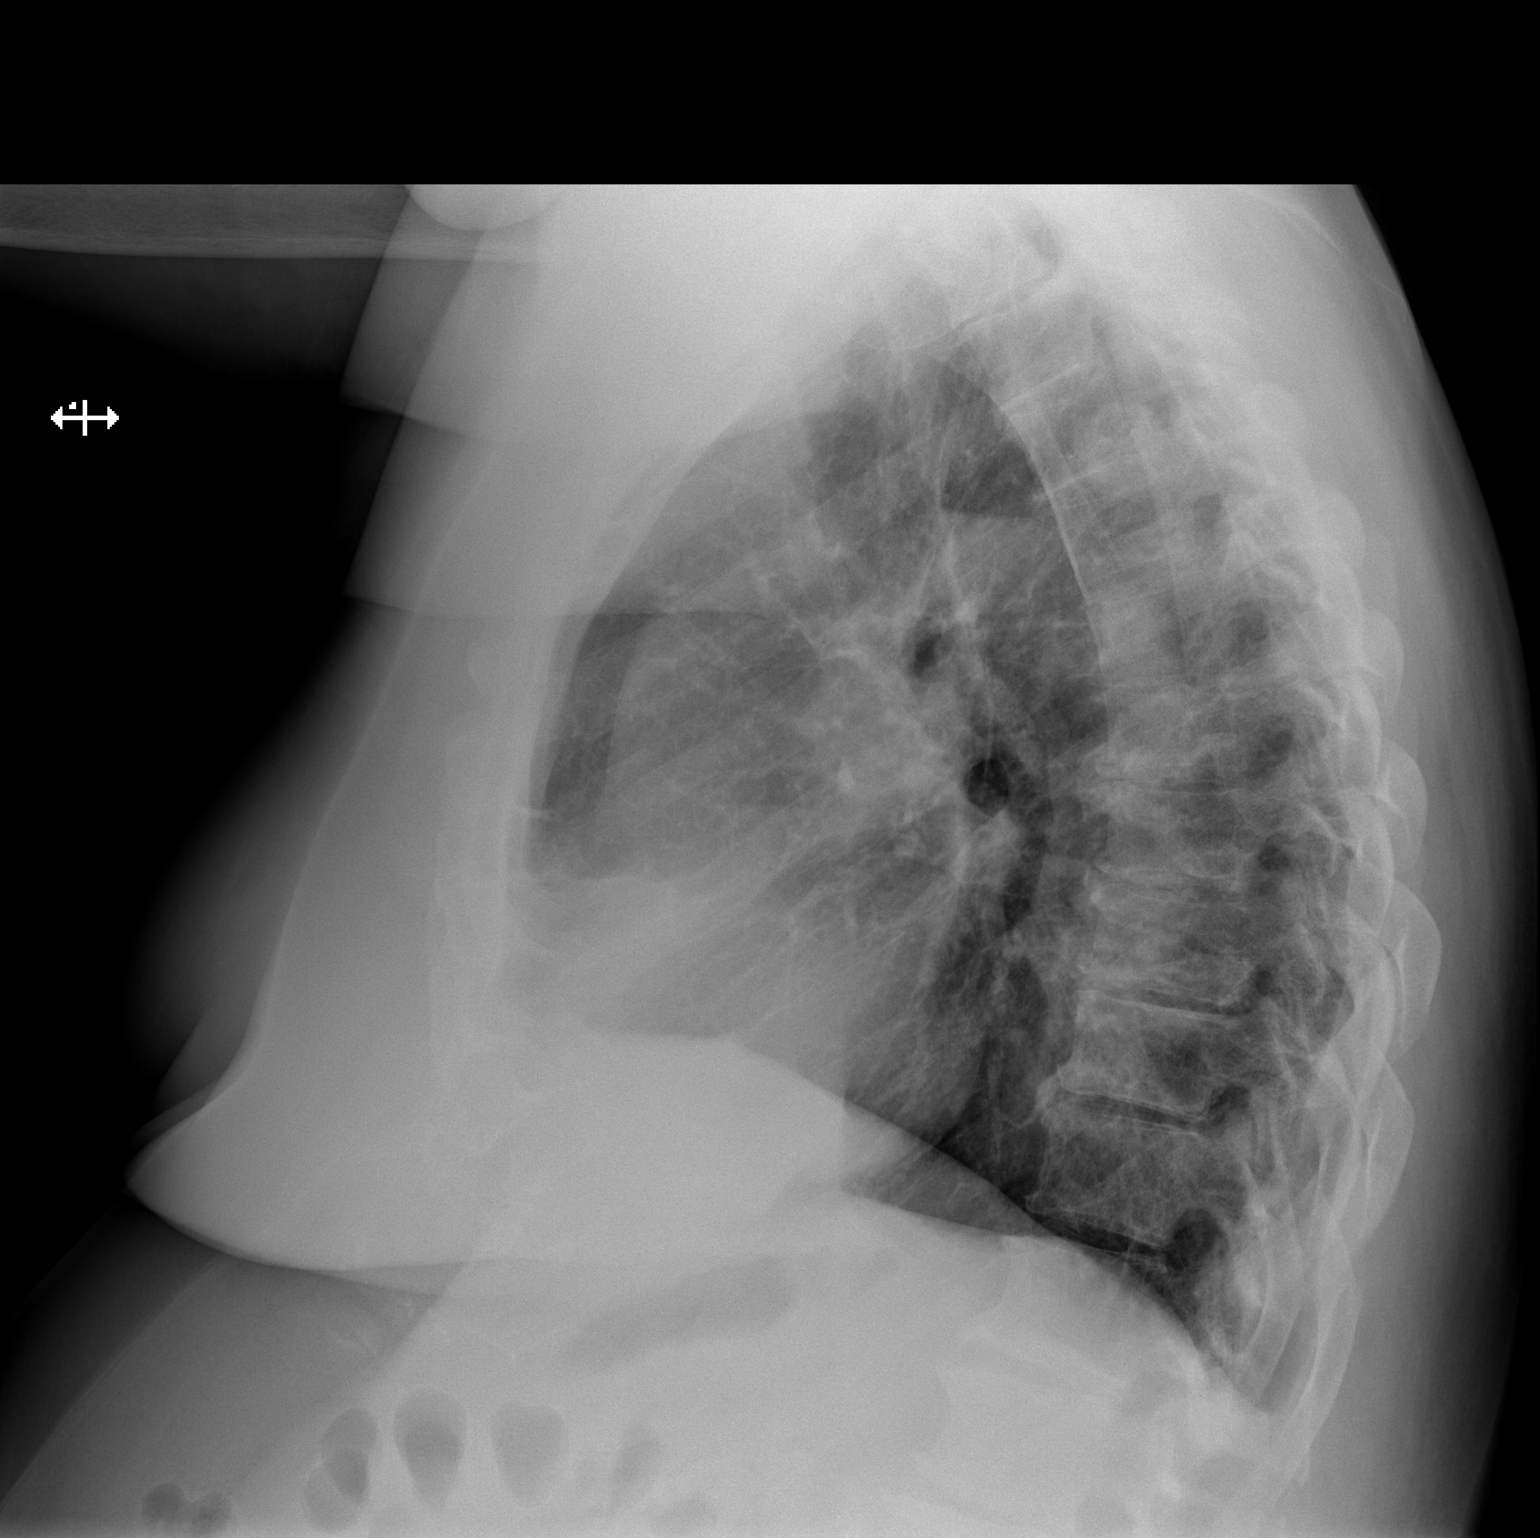

[2 of 2 positions shown; findings below may reference images not displayed]

FINDINGS: The heart size and mediastinal contours are within normal limits.
Both lungs are clear. No pneumothorax or pleural effusion is noted.
Multilevel degenerative disc disease is noted in lower thoracic
spine.
IMPRESSION: No active cardiopulmonary disease.

## 2016-11-03 ENCOUNTER — Telehealth: Payer: Self-pay

## 2016-11-03 NOTE — Telephone Encounter (Signed)
Spoke to patient's wife, let her know verbally when he needs to stop the Xarelto, last dose will be day before on 5/3 in morning. She will come pick up prep instructions today.

## 2016-11-03 NOTE — Telephone Encounter (Signed)
-----   Message from Charlie Pitter III, MD sent at 11/03/2016  9:53 AM EDT ----- I am comfortable managing it, thanks.  - HD ----- Message ----- From: Leverne Humbles, RN Sent: 11/03/2016   9:48 AM To: Sherrilyn Rist, MD  Do you want me to check with patient's PCP re: Xarelto? Looks like this was given to him upon a hospital visit.  ----- Message ----- From: Sherrilyn Rist, MD Sent: 11/03/2016   9:17 AM To: Leverne Humbles, RN  Last dose should be AM on 5/3 (day prior to scope) ----- Message ----- From: Leverne Humbles, RN Sent: 11/03/2016   8:16 AM To: Charlie Pitter III, MD  You had asked me to touch base with you on this patient's Xarelto and his scheduled procedure for 5/4. Let me know what the game plan is.  Thanks, Raynelle Fanning

## 2016-11-03 NOTE — Telephone Encounter (Signed)
Left message for patient/wife on home and cell number to call office re: management of Xarelto prior to procedure.

## 2016-11-07 ENCOUNTER — Telehealth: Payer: Self-pay

## 2016-11-07 DIAGNOSIS — H18413 Arcus senilis, bilateral: Secondary | ICD-10-CM | POA: Diagnosis not present

## 2016-11-07 DIAGNOSIS — H2513 Age-related nuclear cataract, bilateral: Secondary | ICD-10-CM | POA: Diagnosis not present

## 2016-11-07 NOTE — Telephone Encounter (Signed)
Spoke with Andrew Winters at  [facility] endoscopy and have been reassured that the botox will be available for the procedure for this Friday, 5/4.

## 2016-11-09 DIAGNOSIS — E119 Type 2 diabetes mellitus without complications: Secondary | ICD-10-CM | POA: Diagnosis not present

## 2016-11-10 ENCOUNTER — Encounter (HOSPITAL_COMMUNITY): Payer: Self-pay | Admitting: *Deleted

## 2016-11-11 ENCOUNTER — Encounter (HOSPITAL_COMMUNITY)
Admission: RE | Disposition: A | Discharge status: Home / Self Care | Payer: Self-pay | Source: Ambulatory Visit | Attending: Gastroenterology

## 2016-11-11 ENCOUNTER — Encounter (HOSPITAL_COMMUNITY): Payer: Self-pay

## 2016-11-11 ENCOUNTER — Ambulatory Visit (HOSPITAL_COMMUNITY): Payer: Medicare Other | Admitting: Anesthesiology

## 2016-11-11 ENCOUNTER — Ambulatory Visit (HOSPITAL_COMMUNITY)
Admission: RE | Admit: 2016-11-11 | Discharge: 2016-11-11 | Disposition: A | Discharge status: Home / Self Care | Payer: Medicare Other | Source: Ambulatory Visit | Attending: Gastroenterology | Admitting: Gastroenterology

## 2016-11-11 DIAGNOSIS — Z7984 Long term (current) use of oral hypoglycemic drugs: Secondary | ICD-10-CM | POA: Insufficient documentation

## 2016-11-11 DIAGNOSIS — E1142 Type 2 diabetes mellitus with diabetic polyneuropathy: Secondary | ICD-10-CM | POA: Insufficient documentation

## 2016-11-11 DIAGNOSIS — H9192 Unspecified hearing loss, left ear: Secondary | ICD-10-CM | POA: Diagnosis not present

## 2016-11-11 DIAGNOSIS — E559 Vitamin D deficiency, unspecified: Secondary | ICD-10-CM | POA: Diagnosis not present

## 2016-11-11 DIAGNOSIS — M069 Rheumatoid arthritis, unspecified: Secondary | ICD-10-CM | POA: Diagnosis not present

## 2016-11-11 DIAGNOSIS — R634 Abnormal weight loss: Secondary | ICD-10-CM

## 2016-11-11 DIAGNOSIS — R1310 Dysphagia, unspecified: Secondary | ICD-10-CM | POA: Insufficient documentation

## 2016-11-11 DIAGNOSIS — G4700 Insomnia, unspecified: Secondary | ICD-10-CM | POA: Insufficient documentation

## 2016-11-11 DIAGNOSIS — F329 Major depressive disorder, single episode, unspecified: Secondary | ICD-10-CM | POA: Diagnosis not present

## 2016-11-11 DIAGNOSIS — Z96651 Presence of right artificial knee joint: Secondary | ICD-10-CM | POA: Diagnosis not present

## 2016-11-11 DIAGNOSIS — K228 Other specified diseases of esophagus: Secondary | ICD-10-CM | POA: Insufficient documentation

## 2016-11-11 DIAGNOSIS — I10 Essential (primary) hypertension: Secondary | ICD-10-CM | POA: Insufficient documentation

## 2016-11-11 DIAGNOSIS — Z86711 Personal history of pulmonary embolism: Secondary | ICD-10-CM | POA: Insufficient documentation

## 2016-11-11 DIAGNOSIS — I739 Peripheral vascular disease, unspecified: Secondary | ICD-10-CM | POA: Diagnosis not present

## 2016-11-11 DIAGNOSIS — K219 Gastro-esophageal reflux disease without esophagitis: Secondary | ICD-10-CM | POA: Diagnosis not present

## 2016-11-11 DIAGNOSIS — F419 Anxiety disorder, unspecified: Secondary | ICD-10-CM | POA: Insufficient documentation

## 2016-11-11 HISTORY — PX: BOTOX INJECTION: SHX5754

## 2016-11-11 HISTORY — PX: ESOPHAGOGASTRODUODENOSCOPY: SHX5428

## 2016-11-11 LAB — GLUCOSE, CAPILLARY: GLUCOSE-CAPILLARY: 115 mg/dL — AB (ref 65–99)

## 2016-11-11 SURGERY — EGD (ESOPHAGOGASTRODUODENOSCOPY)
Anesthesia: Monitor Anesthesia Care

## 2016-11-11 MED ORDER — SODIUM CHLORIDE 0.9 % IV SOLN
INTRAVENOUS | Status: DC
Start: 2016-11-11 — End: 2016-11-11

## 2016-11-11 MED ORDER — LACTATED RINGERS IV SOLN
INTRAVENOUS | Status: DC
  Administered 2016-11-11: 09:00:00 via INTRAVENOUS

## 2016-11-11 MED ORDER — SODIUM CHLORIDE 0.9 % IJ SOLN
INTRAMUSCULAR | Status: AC
  Filled 2016-11-11: qty 10

## 2016-11-11 MED ORDER — PROPOFOL 10 MG/ML IV BOLUS
INTRAVENOUS | Status: AC
  Filled 2016-11-11: qty 20

## 2016-11-11 MED ORDER — ONABOTULINUMTOXINA 100 UNITS IJ SOLR
INTRAMUSCULAR | Status: AC
  Filled 2016-11-11: qty 100

## 2016-11-11 MED ORDER — PROPOFOL 10 MG/ML IV BOLUS
INTRAVENOUS | Status: DC | PRN
  Administered 2016-11-11: 20 mg via INTRAVENOUS
  Administered 2016-11-11: 40 mg via INTRAVENOUS
  Administered 2016-11-11: 20 mg via INTRAVENOUS

## 2016-11-11 MED ORDER — SODIUM CHLORIDE 0.9 % IJ SOLN
INTRAMUSCULAR | Status: DC | PRN
  Administered 2016-11-11: 4 mL via SUBMUCOSAL

## 2016-11-11 MED ORDER — PROPOFOL 500 MG/50ML IV EMUL
INTRAVENOUS | Status: DC | PRN
  Administered 2016-11-11: 75 ug/kg/min via INTRAVENOUS

## 2016-11-11 NOTE — Anesthesia Preprocedure Evaluation (Signed)
Anesthesia Evaluation  Patient identified by MRN, date of birth, ID band Patient awake    Reviewed: Allergy & Precautions, NPO status , Patient's Chart, lab work & pertinent test results  Airway Mallampati: III  TM Distance: >3 FB Neck ROM: full    Dental  (+) Teeth Intact, Dental Advidsory Given   Pulmonary pneumonia, Current Smoker, former smoker,    breath sounds clear to auscultation       Cardiovascular hypertension, On Medications + Peripheral Vascular Disease   Rhythm:Regular Rate:Normal     Neuro/Psych  Headaches, PSYCHIATRIC DISORDERS Anxiety Depression  Neuromuscular disease    GI/Hepatic hiatal hernia, GERD  Medicated and Controlled,  Endo/Other  diabetes, Type 2, Oral Hypoglycemic AgentsHyperthyroidism   Renal/GU      Musculoskeletal  (+) Arthritis ,   Abdominal (+) + obese,   Peds  Hematology  (+) anemia ,   Anesthesia Other Findings   Reproductive/Obstetrics                             Anesthesia Physical  Anesthesia Plan  ASA: III  Anesthesia Plan: MAC   Post-op Pain Management:    Induction: Intravenous  Airway Management Planned:   Additional Equipment:   Intra-op Plan:   Post-operative Plan:   Informed Consent: I have reviewed the patients History and Physical, chart, labs and discussed the procedure including the risks, benefits and alternatives for the proposed anesthesia with the patient or authorized representative who has indicated his/her understanding and acceptance.   Dental advisory given  Plan Discussed with: CRNA  Anesthesia Plan Comments:        Anesthesia Quick Evaluation  

## 2016-11-11 NOTE — Interval H&P Note (Signed)
History and Physical Interval Note:  11/11/2016 9:50 AM  Andrew Winters  has presented today for surgery, with the diagnosis of dysphagia, GERD  The various methods of treatment have been discussed with the patient and family. After consideration of risks, benefits and other options for treatment, the patient has consented to  Procedure(s): ESOPHAGOGASTRODUODENOSCOPY (EGD) (N/A) BOTOX INJECTION (N/A) as a surgical intervention .  The patient's history has been reviewed, patient examined, no change in status, stable for surgery.  I have reviewed the patient's chart and labs.  Questions were answered to the patient's satisfaction.     Charlie Pitter III

## 2016-11-11 NOTE — H&P (Signed)
History:  This patient presents for endoscopic testing for dysphagia - EGJ outflow obstruction.  [name] Referring physician: Crist Fat, MD  Past Medical History: Past Medical History:  Diagnosis Date  . Acute saddle pulmonary embolism (HCC) 05/20/2016  . Anxiety   . Bilateral swelling of feet   . BPH (benign prostatic hyperplasia)    no meds needed per medical md  . Cataracts, bilateral    immature  . Chronic diarrhea   . Deafness    in left ear   . Depression    takes Zoloft daily  . Diabetes mellitus    takes Actos and Amaryl daily as well as Onglyza  . Dysphagia   . Fatigue   . GERD (gastroesophageal reflux disease)    takes Omeprazole daily  . Headaches, cluster   . Hiatal hernia   . Hyperlipemia    was on meds but hasnt been on anything in about 46months per MD  . Hypertension    takes Amlodipine,HCTZ,and Lisinopril daily  . Hyperthyroidism   . Insomnia    takes Trazodone nightly  . Nocturia   . Osteoarthritis   . Peripheral neuropathy    takes Gabapentin daily  . Pneumonia 3+ yrs ago  . Presbyesophagus   . Rheumatoid arthritis(714.0)   . Vitamin D deficiency    takes Vit D daily     Past Surgical History: Past Surgical History:  Procedure Laterality Date  . BACK SURGERY  1998  . COLONOSCOPY WITH ESOPHAGOGASTRODUODENOSCOPY (EGD) AND ESOPHAGEAL DILATION (ED)    . ESOPHAGEAL MANOMETRY N/A 09/14/2016   Procedure: ESOPHAGEAL MANOMETRY (EM);  Surgeon: Ruffin Frederick, MD;  Location: [facility];  Service: Gastroenterology;  Laterality: N/A;  . ESOPHAGOGASTRODUODENOSCOPY (EGD) WITH PROPOFOL N/A 09/15/2016   Procedure: ESOPHAGOGASTRODUODENOSCOPY (EGD) WITH PROPOFOL;  Surgeon: Ruffin Frederick, MD;  Location: [facility];  Service: Gastroenterology;  Laterality: N/A;  . IRRIGATION AND DEBRIDEMENT KNEE Right 01/12/2015  . TOTAL KNEE ARTHROPLASTY Right 01/06/2015   Procedure: TOTAL KNEE ARTHROPLASTY;  Surgeon: Marcene Corning, MD;  Location:  [facility] OR;  Service: Orthopedics;  Laterality: Right;    Allergies: Allergies  Allergen Reactions  . Aspirin Diarrhea    Outpatient Meds: Current Facility-Administered Medications  Medication Dose Route Frequency Provider Last Rate Last Dose  . 0.9 %  sodium chloride infusion   Intravenous Continuous Charlie Pitter III, MD      . lactated ringers infusion   Intravenous Continuous Charlie Pitter III, MD          ___________________________________________________________________ Objective   Exam:  BP (!) 157/78   Pulse 65   Temp 98.1 F (36.7 C) (Oral)   Resp (!) 22   Ht 5' 9.5" (1.765 m)   Wt 253 lb (114.8 kg)   SpO2 96%   BMI 36.83 kg/m    CV: RRR without murmur, S1/S2, no JVD, no peripheral edema  Resp: clear to auscultation bilaterally, normal RR and effort noted  GI: obese, soft, no tenderness, with active bowel sounds. No guarding or palpable organomegaly noted.  Neuro: awake, alert and oriented x 3. Normal gross motor function and fluent speech   Assessment/Plan:  EGD with BoTox injection of LES for dysphagia    Charlie Pitter III

## 2016-11-11 NOTE — Transfer of Care (Signed)
Immediate Anesthesia Transfer of Care Note  Patient: Andrew Winters  Procedure(s) Performed: Procedure(s): ESOPHAGOGASTRODUODENOSCOPY (EGD) (N/A) BOTOX INJECTION (N/A)  Patient Location: PACU  Anesthesia Type:MAC  Level of Consciousness:  sedated, patient cooperative and responds to stimulation  Airway & Oxygen Therapy:Patient Spontanous Breathing and Patient connected to face mask oxgen  Post-op Assessment:  Report given to PACU RN and Post -op Vital signs reviewed and stable  Post vital signs:  Reviewed and stable  Last Vitals:  Vitals:   11/11/16 0856  BP: (!) 157/78  Pulse: 65  Resp: (!) 22  Temp: 01.0 C    Complications: No apparent anesthesia complications

## 2016-11-11 NOTE — Anesthesia Postprocedure Evaluation (Signed)
Anesthesia Post Note  Patient: Andrew Winters  Procedure(s) Performed: Procedure(s) (LRB): ESOPHAGOGASTRODUODENOSCOPY (EGD) (N/A) BOTOX INJECTION (N/A)  Patient location during evaluation: PACU Anesthesia Type: MAC Level of consciousness: awake and alert Pain management: pain level controlled Vital Signs Assessment: post-procedure vital signs reviewed and stable Respiratory status: spontaneous breathing, nonlabored ventilation, respiratory function stable and patient connected to nasal cannula oxygen Cardiovascular status: stable and blood pressure returned to baseline Anesthetic complications: no       Last Vitals:  Vitals:   11/11/16 1050 11/11/16 1100  BP: (!) 154/87 (!) 177/85  Pulse: 62 64  Resp: 17 (!) 21  Temp:      Last Pain:  Vitals:   11/11/16 1021  TempSrc: Oral                 Aspen Deterding EDWARD

## 2016-11-11 NOTE — Discharge Instructions (Signed)
Resume Xarelto (rivaroxaban) at your usual dose today.

## 2016-11-11 NOTE — Op Note (Signed)
[facility] Patient Name: Andrew Winters Procedure Date: 11/11/2016 MRN: [id number] Attending MD: Estill Cotta. Loletha Carrow , MD Date of Birth: 30-Dec-1940 CSN: [id number] Age: 76 Admit Type: Outpatient Procedure:                Upper GI endoscopy Indications:              Dysphagia, , Weight loss (EGJ ouflow obstruction on                            manometry) Providers:                Estill Cotta. Loletha Carrow, MD, Carolynn Comment, RN, Corliss Parish, Technician Referring MD:              Medicines:                Monitored Anesthesia Care Complications:            No immediate complications. Estimated Blood Loss:     Estimated blood loss: none. Procedure:                Pre-Anesthesia Assessment:                           - Prior to the procedure, a History and Physical                            was performed, and patient medications and                            allergies were reviewed. The patient's tolerance of                            previous anesthesia was also reviewed. The risks                            and benefits of the procedure and the sedation                            options and risks were discussed with the patient.                            All questions were answered, and informed consent                            was obtained. Prior Anticoagulants: The patient has                            taken Xarelto (rivaroxaban), last dose was 2 days                            prior to procedure. ASA Grade Assessment: III - A  patient with severe systemic disease. After                            reviewing the risks and benefits, the patient was                            deemed in satisfactory condition to undergo the                            procedure.                           After obtaining informed consent, the endoscope was                            passed under direct vision. Throughout the               procedure, the patient's blood pressure, pulse, and                            oxygen saturations were monitored continuously. The                            EG-[id number] ([id number]) scope was introduced through the                            mouth, and advanced to the second part of duodenum.                            The upper GI endoscopy was accomplished without                            difficulty. The patient tolerated the procedure                            well. Scope In: Scope Out: Findings:      A hypertonic lower esophageal sphincter was found causing moderate       resistance to scope passage. No stricture was present. The esophagus       appeared to have active motility. The Area was successfully injected       with 100 units botulinum toxin in a four-quadrant fashion.      The stomach was normal.      The cardia and gastric fundus were normal on retroflexion.      The examined duodenum was normal. Impression:               - Hypertonic lower esophageal sphincter. Injected                            with botulinum toxin.                           - Normal stomach.                           - Normal examined duodenum.                           -  No specimens collected. Moderate Sedation:      MAC sedation used Recommendation:           - Patient has a contact number available for                            emergencies. The signs and symptoms of potential                            delayed complications were discussed with the                            patient. Return to normal activities tomorrow.                            Written discharge instructions were provided to the                            patient.                           - Resume previous diet.                           - Resume Xarelto (rivaroxaban) at prior dose today.                           - Call my office in 2 weeks with an update on                            symptoms.                            - Continue present medications. Procedure Code(s):        --- Professional ---                           [phone], Esophagogastroduodenoscopy, flexible,                            transoral; with directed submucosal injection(s),                            any substance Diagnosis Code(s):        --- Professional ---                           K22.8, Other specified diseases of esophagus                           R13.10, Dysphagia, unspecified                           R63.4, Abnormal weight loss CPT copyright 2016 American Medical Association. All rights reserved. The codes documented in this report are preliminary and upon coder review may  be revised to meet current compliance requirements. [name], MD 11/11/2016 10:22:53 AM This report has been  signed electronically. Number of Addenda: 0

## 2016-11-14 ENCOUNTER — Encounter (HOSPITAL_COMMUNITY): Payer: Self-pay | Admitting: Gastroenterology

## 2016-11-18 DIAGNOSIS — E119 Type 2 diabetes mellitus without complications: Secondary | ICD-10-CM | POA: Diagnosis not present

## 2016-11-25 ENCOUNTER — Telehealth: Payer: Self-pay

## 2016-11-25 NOTE — Telephone Encounter (Signed)
Thanks for the update.  Please call his wife next Monday and check in.

## 2016-11-25 NOTE — Telephone Encounter (Signed)
Looking at endoscopy report patient was advised to call back to office in 2 weeks to give an update on his symptoms, I have not heard back from him. I tried calling patient and was not able to reach him or leave a voice message.

## 2016-11-28 NOTE — Telephone Encounter (Signed)
Left message for patient's wife to call back, checking to see how he is doing.

## 2016-11-29 NOTE — Telephone Encounter (Signed)
Wife called back to let us know Andrew Winters is doing fine, he is still working on training himself to not eat too much at one time.

## 2016-12-01 DIAGNOSIS — E119 Type 2 diabetes mellitus without complications: Secondary | ICD-10-CM | POA: Diagnosis not present

## 2016-12-01 DIAGNOSIS — B351 Tinea unguium: Secondary | ICD-10-CM | POA: Diagnosis not present

## 2016-12-02 DIAGNOSIS — H2512 Age-related nuclear cataract, left eye: Secondary | ICD-10-CM | POA: Diagnosis not present

## 2016-12-02 DIAGNOSIS — H2511 Age-related nuclear cataract, right eye: Secondary | ICD-10-CM | POA: Diagnosis not present

## 2016-12-29 DIAGNOSIS — H2511 Age-related nuclear cataract, right eye: Secondary | ICD-10-CM | POA: Diagnosis not present

## 2017-01-19 NOTE — Anesthesia Postprocedure Evaluation (Signed)
Anesthesia Post Note  Patient: Andrew Winters  Procedure(s) Performed: Procedure(s) (LRB): ESOPHAGOGASTRODUODENOSCOPY (EGD) WITH PROPOFOL (N/A)     Anesthesia Post Evaluation  Last Vitals:  Vitals:   09/17/16 0600 09/17/16 1012  BP: 139/73 124/64  Pulse: 69   Resp:    Temp: 36.6 C     Last Pain:  Vitals:   09/17/16 0833  TempSrc:   PainSc: 0-No pain                 Lowella Curb

## 2017-01-19 NOTE — Addendum Note (Signed)
Addendum  created 01/19/17 1628 by Arish Redner Ray, MD   Sign clinical note    

## 2017-01-20 NOTE — Addendum Note (Signed)
Addendum  created 01/20/17 1215 by Cristela Blue, MD   Sign clinical note

## 2017-01-20 NOTE — Anesthesia Postprocedure Evaluation (Signed)
Anesthesia Post Note  Patient: Andrew Winters  Procedure(s) Performed: Procedure(s) (LRB): ESOPHAGOGASTRODUODENOSCOPY (EGD) (N/A) BOTOX INJECTION (N/A)     Anesthesia Post Evaluation  Last Vitals:  Vitals:   11/11/16 1050 11/11/16 1100  BP: (!) 154/87 (!) 177/85  Pulse: 62 64  Resp: 17 (!) 21  Temp:      Last Pain:  Vitals:   11/11/16 1021  TempSrc: Oral                 Avleen Bordwell EDWARD

## 2017-02-22 DIAGNOSIS — E1140 Type 2 diabetes mellitus with diabetic neuropathy, unspecified: Secondary | ICD-10-CM | POA: Diagnosis not present

## 2017-02-22 DIAGNOSIS — E119 Type 2 diabetes mellitus without complications: Secondary | ICD-10-CM | POA: Diagnosis not present

## 2017-02-22 DIAGNOSIS — R0683 Snoring: Secondary | ICD-10-CM | POA: Diagnosis not present

## 2017-02-23 DIAGNOSIS — H2512 Age-related nuclear cataract, left eye: Secondary | ICD-10-CM | POA: Diagnosis not present

## 2017-02-23 DIAGNOSIS — H20012 Primary iridocyclitis, left eye: Secondary | ICD-10-CM | POA: Diagnosis not present

## 2017-03-02 DIAGNOSIS — Z961 Presence of intraocular lens: Secondary | ICD-10-CM | POA: Diagnosis not present

## 2017-03-07 DIAGNOSIS — G4730 Sleep apnea, unspecified: Secondary | ICD-10-CM | POA: Diagnosis not present

## 2017-03-07 DIAGNOSIS — G4733 Obstructive sleep apnea (adult) (pediatric): Secondary | ICD-10-CM | POA: Diagnosis not present

## 2017-04-04 DIAGNOSIS — B351 Tinea unguium: Secondary | ICD-10-CM | POA: Diagnosis not present

## 2017-04-04 DIAGNOSIS — E119 Type 2 diabetes mellitus without complications: Secondary | ICD-10-CM | POA: Diagnosis not present

## 2017-04-12 DIAGNOSIS — Z23 Encounter for immunization: Secondary | ICD-10-CM | POA: Diagnosis not present

## 2017-06-16 DIAGNOSIS — E1140 Type 2 diabetes mellitus with diabetic neuropathy, unspecified: Secondary | ICD-10-CM | POA: Diagnosis not present

## 2017-09-22 DIAGNOSIS — G4733 Obstructive sleep apnea (adult) (pediatric): Secondary | ICD-10-CM | POA: Diagnosis not present

## 2017-09-26 ENCOUNTER — Encounter (HOSPITAL_COMMUNITY): Payer: Self-pay

## 2017-09-26 ENCOUNTER — Other Ambulatory Visit: Payer: Self-pay

## 2017-09-26 ENCOUNTER — Emergency Department (HOSPITAL_COMMUNITY): Payer: Medicare Other

## 2017-09-26 ENCOUNTER — Inpatient Hospital Stay (HOSPITAL_COMMUNITY)
Admission: EM | Admit: 2017-09-26 | Discharge: 2017-09-28 | DRG: 872 | Disposition: A | Discharge status: Home / Self Care | Payer: Medicare Other | Attending: Internal Medicine | Admitting: Internal Medicine

## 2017-09-26 DIAGNOSIS — Z86711 Personal history of pulmonary embolism: Secondary | ICD-10-CM

## 2017-09-26 DIAGNOSIS — F0390 Unspecified dementia without behavioral disturbance: Secondary | ICD-10-CM | POA: Diagnosis not present

## 2017-09-26 DIAGNOSIS — R197 Diarrhea, unspecified: Secondary | ICD-10-CM | POA: Diagnosis not present

## 2017-09-26 DIAGNOSIS — F329 Major depressive disorder, single episode, unspecified: Secondary | ICD-10-CM | POA: Diagnosis present

## 2017-09-26 DIAGNOSIS — J069 Acute upper respiratory infection, unspecified: Secondary | ICD-10-CM | POA: Diagnosis not present

## 2017-09-26 DIAGNOSIS — R509 Fever, unspecified: Secondary | ICD-10-CM

## 2017-09-26 DIAGNOSIS — N390 Urinary tract infection, site not specified: Secondary | ICD-10-CM | POA: Diagnosis present

## 2017-09-26 DIAGNOSIS — G309 Alzheimer's disease, unspecified: Secondary | ICD-10-CM | POA: Diagnosis not present

## 2017-09-26 DIAGNOSIS — R1310 Dysphagia, unspecified: Secondary | ICD-10-CM | POA: Diagnosis not present

## 2017-09-26 DIAGNOSIS — Z8249 Family history of ischemic heart disease and other diseases of the circulatory system: Secondary | ICD-10-CM

## 2017-09-26 DIAGNOSIS — H9192 Unspecified hearing loss, left ear: Secondary | ICD-10-CM | POA: Diagnosis not present

## 2017-09-26 DIAGNOSIS — F339 Major depressive disorder, recurrent, unspecified: Secondary | ICD-10-CM | POA: Diagnosis not present

## 2017-09-26 DIAGNOSIS — E1142 Type 2 diabetes mellitus with diabetic polyneuropathy: Secondary | ICD-10-CM

## 2017-09-26 DIAGNOSIS — Z96651 Presence of right artificial knee joint: Secondary | ICD-10-CM | POA: Diagnosis not present

## 2017-09-26 DIAGNOSIS — A419 Sepsis, unspecified organism: Secondary | ICD-10-CM | POA: Diagnosis not present

## 2017-09-26 DIAGNOSIS — M069 Rheumatoid arthritis, unspecified: Secondary | ICD-10-CM | POA: Diagnosis present

## 2017-09-26 DIAGNOSIS — Z7901 Long term (current) use of anticoagulants: Secondary | ICD-10-CM | POA: Diagnosis not present

## 2017-09-26 DIAGNOSIS — R740 Nonspecific elevation of levels of transaminase and lactic acid dehydrogenase [LDH]: Secondary | ICD-10-CM | POA: Diagnosis not present

## 2017-09-26 DIAGNOSIS — R531 Weakness: Secondary | ICD-10-CM

## 2017-09-26 DIAGNOSIS — M1711 Unilateral primary osteoarthritis, right knee: Secondary | ICD-10-CM

## 2017-09-26 DIAGNOSIS — F0280 Dementia in other diseases classified elsewhere without behavioral disturbance: Secondary | ICD-10-CM | POA: Diagnosis present

## 2017-09-26 DIAGNOSIS — I119 Hypertensive heart disease without heart failure: Secondary | ICD-10-CM | POA: Diagnosis present

## 2017-09-26 DIAGNOSIS — Z883 Allergy status to other anti-infective agents status: Secondary | ICD-10-CM

## 2017-09-26 DIAGNOSIS — K228 Other specified diseases of esophagus: Secondary | ICD-10-CM

## 2017-09-26 DIAGNOSIS — Z7984 Long term (current) use of oral hypoglycemic drugs: Secondary | ICD-10-CM

## 2017-09-26 DIAGNOSIS — I517 Cardiomegaly: Secondary | ICD-10-CM | POA: Diagnosis present

## 2017-09-26 DIAGNOSIS — K219 Gastro-esophageal reflux disease without esophagitis: Secondary | ICD-10-CM | POA: Diagnosis not present

## 2017-09-26 DIAGNOSIS — E119 Type 2 diabetes mellitus without complications: Secondary | ICD-10-CM

## 2017-09-26 DIAGNOSIS — E0590 Thyrotoxicosis, unspecified without thyrotoxic crisis or storm: Secondary | ICD-10-CM

## 2017-09-26 DIAGNOSIS — Z886 Allergy status to analgesic agent status: Secondary | ICD-10-CM

## 2017-09-26 DIAGNOSIS — R6520 Severe sepsis without septic shock: Secondary | ICD-10-CM | POA: Diagnosis not present

## 2017-09-26 DIAGNOSIS — E559 Vitamin D deficiency, unspecified: Secondary | ICD-10-CM | POA: Diagnosis not present

## 2017-09-26 DIAGNOSIS — I10 Essential (primary) hypertension: Secondary | ICD-10-CM

## 2017-09-26 DIAGNOSIS — R079 Chest pain, unspecified: Secondary | ICD-10-CM | POA: Diagnosis not present

## 2017-09-26 DIAGNOSIS — E860 Dehydration: Secondary | ICD-10-CM | POA: Diagnosis present

## 2017-09-26 DIAGNOSIS — G9340 Encephalopathy, unspecified: Secondary | ICD-10-CM | POA: Diagnosis not present

## 2017-09-26 DIAGNOSIS — Z79899 Other long term (current) drug therapy: Secondary | ICD-10-CM

## 2017-09-26 DIAGNOSIS — R319 Hematuria, unspecified: Secondary | ICD-10-CM

## 2017-09-26 DIAGNOSIS — N179 Acute kidney failure, unspecified: Secondary | ICD-10-CM

## 2017-09-26 DIAGNOSIS — E785 Hyperlipidemia, unspecified: Secondary | ICD-10-CM

## 2017-09-26 DIAGNOSIS — R402441 Other coma, without documented Glasgow coma scale score, or with partial score reported, in the field [EMT or ambulance]: Secondary | ICD-10-CM | POA: Diagnosis not present

## 2017-09-26 DIAGNOSIS — N400 Enlarged prostate without lower urinary tract symptoms: Secondary | ICD-10-CM

## 2017-09-26 DIAGNOSIS — Z87891 Personal history of nicotine dependence: Secondary | ICD-10-CM | POA: Diagnosis not present

## 2017-09-26 LAB — COMPREHENSIVE METABOLIC PANEL
ALBUMIN: 3.3 g/dL — AB (ref 3.5–5.0)
ALT: 77 U/L — ABNORMAL HIGH (ref 17–63)
ANION GAP: 12 (ref 5–15)
AST: 130 U/L — ABNORMAL HIGH (ref 15–41)
Alkaline Phosphatase: 107 U/L (ref 38–126)
BUN: 15 mg/dL (ref 6–20)
CHLORIDE: 106 mmol/L (ref 101–111)
CO2: 24 mmol/L (ref 22–32)
Calcium: 9 mg/dL (ref 8.9–10.3)
Creatinine, Ser: 1.55 mg/dL — ABNORMAL HIGH (ref 0.61–1.24)
GFR calc Af Amer: 48 mL/min — ABNORMAL LOW (ref >60)
GFR calc non Af Amer: 42 mL/min — ABNORMAL LOW (ref >60)
GLUCOSE: 233 mg/dL — AB (ref 65–99)
POTASSIUM: 3.1 mmol/L — AB (ref 3.5–5.1)
SODIUM: 142 mmol/L (ref 135–145)
Total Bilirubin: 0.7 mg/dL (ref 0.3–1.2)
Total Protein: 6.5 g/dL (ref 6.5–8.1)

## 2017-09-26 LAB — URINALYSIS, ROUTINE W REFLEX MICROSCOPIC
BILIRUBIN URINE: NEGATIVE
Glucose, UA: NEGATIVE mg/dL
Ketones, ur: NEGATIVE mg/dL
Nitrite: NEGATIVE
Protein, ur: NEGATIVE mg/dL
SPECIFIC GRAVITY, URINE: 1.015 (ref 1.005–1.030)
pH: 5 (ref 5.0–8.0)

## 2017-09-26 LAB — LACTIC ACID, PLASMA
Lactic Acid, Venous: 3.6 mmol/L (ref 0.5–1.9)
Lactic Acid, Venous: 3.9 mmol/L (ref 0.5–1.9)

## 2017-09-26 LAB — GLUCOSE, CAPILLARY
Glucose-Capillary: 127 mg/dL — ABNORMAL HIGH (ref 65–99)
Glucose-Capillary: 124 mg/dL — ABNORMAL HIGH (ref 65–99)

## 2017-09-26 LAB — CBC WITH DIFFERENTIAL/PLATELET
Basophils Absolute: 0 10*3/uL (ref 0.0–0.1)
Basophils Relative: 0 %
Eosinophils Absolute: 0.1 10*3/uL (ref 0.0–0.7)
Eosinophils Relative: 1 %
HEMATOCRIT: 37.5 % — AB (ref 39.0–52.0)
HEMOGLOBIN: 11.6 g/dL — AB (ref 13.0–17.0)
LYMPHS ABS: 0.3 10*3/uL — AB (ref 0.7–4.0)
Lymphocytes Relative: 4 %
MCH: 28 pg (ref 26.0–34.0)
MCHC: 30.9 g/dL (ref 30.0–36.0)
MCV: 90.6 fL (ref 78.0–100.0)
Monocytes Absolute: 0 10*3/uL — ABNORMAL LOW (ref 0.1–1.0)
Monocytes Relative: 0 %
NEUTROS ABS: 6.6 10*3/uL (ref 1.7–7.7)
NEUTROS PCT: 95 %
Platelets: 153 10*3/uL (ref 150–400)
RBC: 4.14 MIL/uL — AB (ref 4.22–5.81)
RDW: 16.5 % — ABNORMAL HIGH (ref 11.5–15.5)
WBC: 7 10*3/uL (ref 4.0–10.5)

## 2017-09-26 LAB — INFLUENZA PANEL BY PCR (TYPE A & B)
Influenza A By PCR: NEGATIVE
Influenza B By PCR: NEGATIVE

## 2017-09-26 LAB — I-STAT CG4 LACTIC ACID, ED
LACTIC ACID, VENOUS: 2.44 mmol/L — AB (ref 0.5–1.9)
Lactic Acid, Venous: 2.84 mmol/L (ref 0.5–1.9)

## 2017-09-26 LAB — HEMOGLOBIN A1C
Hgb A1c MFr Bld: 8.1 % — ABNORMAL HIGH (ref 4.8–5.6)
Mean Plasma Glucose: 185.77 mg/dL

## 2017-09-26 LAB — PROTIME-INR
INR: 1.59
Prothrombin Time: 18.8 seconds — ABNORMAL HIGH (ref 11.4–15.2)

## 2017-09-26 LAB — CBG MONITORING, ED: GLUCOSE-CAPILLARY: 205 mg/dL — AB (ref 65–99)

## 2017-09-26 MED ORDER — VANCOMYCIN HCL IN DEXTROSE 1-5 GM/200ML-% IV SOLN
1000.0000 mg | Freq: Once | INTRAVENOUS | Status: AC
  Administered 2017-09-26: 1000 mg via INTRAVENOUS
  Filled 2017-09-26: qty 200

## 2017-09-26 MED ORDER — MEMANTINE HCL 10 MG PO TABS
5.0000 mg | ORAL_TABLET | Freq: Every day | ORAL | Status: DC
  Administered 2017-09-26 – 2017-09-28 (×3): 5 mg via ORAL
  Filled 2017-09-26 (×3): qty 1

## 2017-09-26 MED ORDER — VITAMIN E 180 MG (400 UNIT) PO CAPS
400.0000 [IU] | ORAL_CAPSULE | Freq: Every day | ORAL | Status: DC
  Administered 2017-09-26 – 2017-09-28 (×3): 400 [IU] via ORAL
  Filled 2017-09-26 (×3): qty 1

## 2017-09-26 MED ORDER — SODIUM CHLORIDE 0.9 % IV BOLUS (SEPSIS)
1000.0000 mL | Freq: Once | INTRAVENOUS | Status: AC
  Administered 2017-09-26: 1000 mL via INTRAVENOUS

## 2017-09-26 MED ORDER — POTASSIUM CHLORIDE CRYS ER 20 MEQ PO TBCR
40.0000 meq | EXTENDED_RELEASE_TABLET | Freq: Two times a day (BID) | ORAL | Status: AC
  Administered 2017-09-26 – 2017-09-27 (×2): 40 meq via ORAL
  Filled 2017-09-26 (×2): qty 2

## 2017-09-26 MED ORDER — PANTOPRAZOLE SODIUM 40 MG PO TBEC
40.0000 mg | DELAYED_RELEASE_TABLET | Freq: Every day | ORAL | Status: DC
Start: 2017-09-26 — End: 2017-09-28
  Administered 2017-09-26 – 2017-09-28 (×3): 40 mg via ORAL
  Filled 2017-09-26 (×3): qty 1

## 2017-09-26 MED ORDER — TRAMADOL HCL 50 MG PO TABS
50.0000 mg | ORAL_TABLET | Freq: Three times a day (TID) | ORAL | Status: DC | PRN
  Administered 2017-09-27 (×2): 50 mg via ORAL
  Filled 2017-09-26 (×2): qty 1

## 2017-09-26 MED ORDER — INSULIN ASPART 100 UNIT/ML ~~LOC~~ SOLN
0.0000 [IU] | Freq: Three times a day (TID) | SUBCUTANEOUS | Status: DC
  Administered 2017-09-27: 1 [IU] via SUBCUTANEOUS
  Administered 2017-09-27 – 2017-09-28 (×4): 2 [IU] via SUBCUTANEOUS

## 2017-09-26 MED ORDER — GABAPENTIN 100 MG PO CAPS
100.0000 mg | ORAL_CAPSULE | Freq: Three times a day (TID) | ORAL | Status: DC
  Administered 2017-09-26 – 2017-09-28 (×6): 100 mg via ORAL
  Filled 2017-09-26 (×6): qty 1

## 2017-09-26 MED ORDER — FAMOTIDINE 20 MG PO TABS
10.0000 mg | ORAL_TABLET | Freq: Every day | ORAL | Status: DC
  Administered 2017-09-26 – 2017-09-27 (×2): 10 mg via ORAL
  Filled 2017-09-26 (×2): qty 1

## 2017-09-26 MED ORDER — SODIUM CHLORIDE 0.9 % IV BOLUS (SEPSIS)
500.0000 mL | Freq: Once | INTRAVENOUS | Status: AC
  Administered 2017-09-26: 500 mL via INTRAVENOUS

## 2017-09-26 MED ORDER — TRAZODONE HCL 50 MG PO TABS
50.0000 mg | ORAL_TABLET | Freq: Every day | ORAL | Status: DC
  Administered 2017-09-26 – 2017-09-27 (×2): 50 mg via ORAL
  Filled 2017-09-26 (×2): qty 1

## 2017-09-26 MED ORDER — ATORVASTATIN CALCIUM 40 MG PO TABS
40.0000 mg | ORAL_TABLET | Freq: Every day | ORAL | Status: DC
  Administered 2017-09-26 – 2017-09-27 (×2): 40 mg via ORAL
  Filled 2017-09-26 (×2): qty 1

## 2017-09-26 MED ORDER — FERROUS SULFATE 325 (65 FE) MG PO TABS
325.0000 mg | ORAL_TABLET | Freq: Every day | ORAL | Status: DC
  Administered 2017-09-27 – 2017-09-28 (×2): 325 mg via ORAL
  Filled 2017-09-26 (×2): qty 1

## 2017-09-26 MED ORDER — SODIUM CHLORIDE 0.9 % IV BOLUS (SEPSIS): 500.0000 mL | Freq: Once | INTRAVENOUS | Status: DC

## 2017-09-26 MED ORDER — SODIUM CHLORIDE 0.9 % IV SOLN
1250.0000 mg | INTRAVENOUS | Status: DC
  Administered 2017-09-27 – 2017-09-28 (×2): 1250 mg via INTRAVENOUS
  Filled 2017-09-26 (×3): qty 1250

## 2017-09-26 MED ORDER — SERTRALINE HCL 50 MG PO TABS
50.0000 mg | ORAL_TABLET | Freq: Every day | ORAL | Status: DC
  Administered 2017-09-26 – 2017-09-28 (×3): 50 mg via ORAL
  Filled 2017-09-26 (×3): qty 1

## 2017-09-26 MED ORDER — PIPERACILLIN-TAZOBACTAM 3.375 G IVPB 30 MIN
3.3750 g | Freq: Once | INTRAVENOUS | Status: AC
  Administered 2017-09-26: 3.375 g via INTRAVENOUS
  Filled 2017-09-26: qty 50

## 2017-09-26 MED ORDER — VITAMIN D 1000 UNITS PO TABS
1000.0000 [IU] | ORAL_TABLET | Freq: Two times a day (BID) | ORAL | Status: DC
  Administered 2017-09-26 – 2017-09-28 (×4): 1000 [IU] via ORAL
  Filled 2017-09-26 (×4): qty 1

## 2017-09-26 MED ORDER — LORATADINE 10 MG PO TABS
10.0000 mg | ORAL_TABLET | Freq: Every day | ORAL | Status: DC
  Administered 2017-09-27 – 2017-09-28 (×2): 10 mg via ORAL
  Filled 2017-09-26 (×2): qty 1

## 2017-09-26 MED ORDER — ACETAMINOPHEN 325 MG PO TABS
650.0000 mg | ORAL_TABLET | Freq: Four times a day (QID) | ORAL | Status: DC | PRN
  Administered 2017-09-26 – 2017-09-28 (×3): 650 mg via ORAL
  Filled 2017-09-26 (×3): qty 2

## 2017-09-26 MED ORDER — RIVAROXABAN 20 MG PO TABS
20.0000 mg | ORAL_TABLET | Freq: Every day | ORAL | Status: DC
  Administered 2017-09-26 – 2017-09-28 (×3): 20 mg via ORAL
  Filled 2017-09-26 (×3): qty 1

## 2017-09-26 MED ORDER — PIPERACILLIN-TAZOBACTAM 3.375 G IVPB
3.3750 g | Freq: Three times a day (TID) | INTRAVENOUS | Status: DC
  Administered 2017-09-26 – 2017-09-28 (×7): 3.375 g via INTRAVENOUS
  Filled 2017-09-26 (×9): qty 50

## 2017-09-26 MED ORDER — ACETAMINOPHEN 650 MG RE SUPP: 650.0000 mg | Freq: Four times a day (QID) | RECTAL | Status: DC | PRN

## 2017-09-26 MED ORDER — ACETAMINOPHEN 650 MG RE SUPP
650.0000 mg | Freq: Once | RECTAL | Status: AC
  Administered 2017-09-26: 650 mg via RECTAL
  Filled 2017-09-26: qty 1

## 2017-09-26 NOTE — Progress Notes (Signed)
CRITICAL VALUE ALERT  Critical Value:  LA 3.6  Date & Time Notied:  09/26/17 @ 1639  Provider Notified: MD Chundi  Orders Received/Actions taken:

## 2017-09-26 NOTE — Progress Notes (Signed)
Andrew Winters is a 77 y.o. male patient admitted from ED awake, alert - oriented  X 4 - no acute distress noted.  VSS - Blood pressure 133/64, pulse 67, temperature (!) 100.4 F (38 C), temperature source Oral, resp. rate 19, SpO2 96 %.    IV in place, occlusive dsg intact without redness.  Orientation to room, and floor completed with information packet given to patient/family.  Patient declined safety video at this time.  Admission INP armband ID verified with patient/family, and in place.   SR up x 2, fall assessment complete, with patient and family able to verbalize understanding of risk associated with falls, and verbalized understanding to call nsg before up out of bed.  Call light within reach, patient able to voice, and demonstrate understanding.  Skin, clean-dry- intact without evidence of bruising, or skin tears.   No evidence of skin break down noted on exam.     Will cont to eval and treat per MD orders.  Theodosia Blender, RN 09/26/2017 2:48 PM

## 2017-09-26 NOTE — Progress Notes (Signed)
Pharmacy Antibiotic Note  Andrew Winters is a 77 y.o. male admitted on 09/26/2017 with sepsis.  Pharmacy has been consulted for Vancomycin/Zosyn dosing. WBC WNL. Noted renal dysfunction. Lactic acid elevated.   Plan: Vancomycin 2000 mg IV x 1, then give 1250 mg IV q24h Zosyn 3.375G IV q8h to be infused over 4 hours Trend WBC, temp, renal function  F/U infectious work-up Drug levels as indicated  Temp (24hrs), Avg:100.5 F (38.1 C), Min:99.7 F (37.6 C), Max:102.1 F (38.9 C)  Recent Labs  Lab 09/26/17 0614 09/26/17 0636  WBC 7.0  --   CREATININE 1.55*  --   LATICACIDVEN  --  2.84*    CrCl cannot be calculated (Unknown ideal weight.).    Allergies  Allergen Reactions  . Aspirin Diarrhea    Abran Duke 09/26/2017 7:05 AM

## 2017-09-26 NOTE — H&P (Signed)
Date: 09/26/2017               Patient Name:  Andrew Winters MRN: [id number]  DOB: 1941/02/20 Age / Sex: 77 y.o., male   PCP: Townsend Roger, MD              Medical Service: Internal Medicine Teaching Service              Attending Physician: Dr. Rebeca Alert Raynaldo Opitz, MD    First Contact: Dr. Maricela Bo Pager: [phone]  Second Contact: Dr. Danford Bad Pager: [phone]            After Hours (After 5p/  First Contact Pager: [phone]  weekends / holidays): Second Contact Pager: [phone]   Chief Complaint: Altered Mental Status  History of Present Illness:   Andrew Winters is a 77 y.o. male with a past medical history of Type II DM, Hyperthyroidism, Hyperlipidemia, PE, and Dementia who presents with one-day history of altered mental status and generalized weakness. The history-taking was limited by the condition of the patient. He was not able to answer many questions and referred Andrew Winters to his wife who was not present. Patient reports that starting yesterday he began feeling "nervous". Patient came to the hospital because his wife was concerned that "he was not acting like his normal self." The patient's symptoms have gotten worse since this morning. On ROS, patient endorsed URI symptoms (sore throat, sneezing, runny nose). Patient denies any chest pain, SOB, nausea, vomiting, diarrhea. Upon admission to the ED, the patient was febrile (102.1), tachypneic (22), tachycardic (90-100), normotensive. IV Vancomycin and Zosyn and 3L bolus was given. Code sepsis was initiated.    Meds: Current Facility-Administered Medications  Medication Dose Route Frequency Provider Last Rate Last Dose  . piperacillin-tazobactam (ZOSYN) IVPB 3.375 g  3.375 g Intravenous Q8H Erenest Blank, RPH      . [START ON 09/27/2017] vancomycin (VANCOCIN) 1,250 mg in sodium chloride 0.9 % 250 mL IVPB  1,250 mg Intravenous Q24H Erenest Blank, [facility]       Current Outpatient Medications  Medication Sig Dispense Refill  . acetaminophen  (TYLENOL) 500 MG tablet Take 1,000 mg by mouth every 6 (six) hours as needed for mild pain, moderate pain or headache.     Marland Kitchen atorvastatin (LIPITOR) 40 MG tablet Take 40 mg by mouth at bedtime.     . cetirizine (ZYRTEC) 10 MG tablet Take 10 mg by mouth daily.    . cholecalciferol (VITAMIN D) 1000 units tablet Take 1,000 Units by mouth 2 (two) times daily.    Marland Kitchen diltiazem (CARDIZEM SR) 60 MG 12 hr capsule Take 60 mg by mouth 2 (two) times daily.    Marland Kitchen esomeprazole (NEXIUM) 20 MG capsule Take 20 mg by mouth daily before breakfast.    . ferrous sulfate 325 (65 FE) MG tablet Take 325 mg by mouth daily with breakfast.    . furosemide (LASIX) 20 MG tablet Take 1 tablet (20 mg total) by mouth every Monday, Wednesday, and Friday. (Patient taking differently: Take 20 mg by mouth daily. ) 30 tablet 0  . gabapentin (NEURONTIN) 100 MG capsule Take 1 capsule (100 mg total) by mouth at bedtime. (Patient taking differently: Take 100 mg by mouth 3 (three) times daily. ) 30 capsule 0  . glimepiride (AMARYL) 4 MG tablet Take 4 mg by mouth 2 (two) times daily.     Marland Kitchen guaifenesin (HUMIBID E) 400 MG TABS tablet Take 400 mg by mouth every 6 (six) hours as  needed (for cough and congestion).    Marland Kitchen lisinopril (PRINIVIL,ZESTRIL) 10 MG tablet Take 10 mg by mouth daily.     . memantine (NAMENDA) 5 MG tablet Take 1 tablet (5 mg total) by mouth daily. 30 tablet 0  . potassium chloride (K-DUR) 10 MEQ tablet Take 10 mEq by mouth daily.    . ranitidine (ZANTAC) 150 MG tablet Take 150 mg by mouth at bedtime.    . rivaroxaban (XARELTO) 20 MG TABS tablet Take 1 tablet (20 mg total) by mouth daily with supper. Take with food starting 06/15/2016 (Patient taking differently: Take 20 mg by mouth daily. Take with food starting 06/15/2016) 30 tablet 3  . sertraline (ZOLOFT) 50 MG tablet Take 50 mg by mouth daily.    . traMADol (ULTRAM) 50 MG tablet Take 50 mg by mouth every 8 (eight) hours as needed for moderate pain.    . traZODone (DESYREL) 50  MG tablet Take 50 mg by mouth at bedtime.    . vitamin E 400 UNIT capsule Take 400 Units by mouth daily.      Allergies: Allergies as of 09/26/2017 - Review Complete 09/26/2017  Allergen Reaction Noted  . Aspirin Diarrhea 12/30/2010   Past Medical History:  Diagnosis Date  . Acute saddle pulmonary embolism ([place]) 05/20/2016  . Anxiety   . Bilateral swelling of feet   . BPH (benign prostatic hyperplasia)    no meds needed per medical md  . Cataracts, bilateral    immature  . Chronic diarrhea   . Deafness    in left ear   . Depression    takes Zoloft daily  . Diabetes mellitus    takes Actos and Amaryl daily as well as Onglyza  . Dysphagia   . Fatigue   . GERD (gastroesophageal reflux disease)    takes Omeprazole daily  . Headaches, cluster   . Hiatal hernia   . Hyperlipemia    was on meds but hasnt been on anything in about 31month per MD  . Hypertension    takes Amlodipine,HCTZ,and Lisinopril daily  . Hyperthyroidism   . Insomnia    takes Trazodone nightly  . Nocturia   . Osteoarthritis   . Peripheral neuropathy    takes Gabapentin daily  . Pneumonia 3+ yrs ago  . Presbyesophagus   . Rheumatoid arthritis(714.0)   . Vitamin D deficiency    takes Vit D daily   Past Surgical History:  Procedure Laterality Date  . BACK SURGERY  1998  . BOTOX INJECTION N/A 11/11/2016   Procedure: BOTOX INJECTION;  Surgeon: DDoran Stabler MD;  Location: [facility];  Service: Gastroenterology;  Laterality: N/A;  . COLONOSCOPY WITH ESOPHAGOGASTRODUODENOSCOPY (EGD) AND ESOPHAGEAL DILATION (ED)    . ESOPHAGEAL MANOMETRY N/A 09/14/2016   Procedure: ESOPHAGEAL MANOMETRY (EM);  Surgeon: SManus Gunning MD;  [facility]: [facility];  Service: Gastroenterology;  Laterality: N/A;  . ESOPHAGOGASTRODUODENOSCOPY N/A 11/11/2016   Procedure: ESOPHAGOGASTRODUODENOSCOPY (EGD);  Surgeon: DDoran Stabler MD;  [facility];  Service: Gastroenterology;  Laterality: N/A;  .  ESOPHAGOGASTRODUODENOSCOPY (EGD) WITH PROPOFOL N/A 09/15/2016   Procedure: ESOPHAGOGASTRODUODENOSCOPY (EGD) WITH PROPOFOL;  Surgeon: SManus Gunning MD;  [facility];  Service: Gastroenterology;  Laterality: N/A;  . IRRIGATION AND DEBRIDEMENT KNEE Right 01/12/2015  . TOTAL KNEE ARTHROPLASTY Right 01/06/2015   Procedure: TOTAL KNEE ARTHROPLASTY;  Surgeon: PMelrose Nakayama MD;  [place] [facility]  Service: Orthopedics;  Laterality: Right;   Family History  Problem Relation Age of  Onset  . Heart disease Mother   . Diabetes Mother   . Heart attack Father   . Diabetes Brother   . Colon cancer Neg Hx   . Stomach cancer Neg Hx   . Esophageal cancer Neg Hx   . Rectal cancer Neg Hx   . Liver cancer Neg Hx    Social History   Socioeconomic History  . Marital status: Married    Spouse name: Not on file  . Number of children: 3  . Years of education: Not on file  . Highest education level: Not on file  Social Needs  . Financial resource strain: Not on file  . Food insecurity - worry: Not on file  . Food insecurity - inability: Not on file  . Transportation needs - medical: Not on file  . Transportation needs - non-medical: Not on file  Occupational History  . Occupation: Retired   Tobacco Use  . Smoking status: Former Smoker    Packs/day: 1.00    Years: 35.00    Pack years: 35.00    Types: Cigarettes    Last attempt to quit: 10/15/2014    Years since quitting: 2.9  . Smokeless tobacco: Never Used  . Tobacco comment: pack last about 2-3wks  Substance and Sexual Activity  . Alcohol use: No  . Drug use: No  . Sexual activity: Not Currently  Other Topics Concern  . Not on file  Social History Narrative  . Not on file    Review of Systems: Pertinent items noted in HPI and remainder of comprehensive ROS otherwise negative.  Physical Exam: Blood pressure 133/64, pulse 67, temperature (!) 100.4 F (38 C), temperature source Oral, resp. rate 19, SpO2 96 %. BP 132/62  (BP Location: Left Arm)   Pulse 82   Temp 98.2 F (36.8 C) (Oral)   Resp (!) 30   SpO2 97%  General appearance: alert, cooperative, appears stated age and morbidly obese Head: Normocephalic, without obvious abnormality, atraumatic Eyes: conjunctivae/corneas clear. PERRL, EOM's intact. Fundi benign. Nose: Nares normal. Septum midline. Mucosa normal. No drainage or sinus tenderness. Throat: lips, mucosa, and tongue normal; teeth and gums normal Lungs: clear to auscultation bilaterally Heart: regular rate and rhythm, S1, S2 normal, no murmur, click, rub or gallop Abdomen: soft, non-tender; bowel sounds normal; no masses,  no organomegaly Extremities: extremities normal, atraumatic, no cyanosis or edema Pulses: 2+ and symmetric  Lab results: '@LABTEST' @  Imaging results:  Dg Chest Port 1 View  Result Date: 09/26/2017 CLINICAL DATA:  Chest pain.  Fever. EXAM: PORTABLE CHEST 1 VIEW COMPARISON:  09/16/2016.  09/11/2016.  CT 09/11/2016. FINDINGS: Mediastinum and hilar structures normal. Cardiomegaly with normal pulmonary vascularity. Low no focal infiltrate. Stable left apical pleural calcification. No pleural effusion or pneumothorax. Degenerative change thoracic spine. IMPRESSION: 1.  Stable cardiomegaly. 2.  No acute pulmonary disease. Electronically Signed   By: Marcello Moores  Register   On: 09/26/2017 07:27    Other results: EKG: normal EKG, normal sinus rhythm, unchanged from previous tracings.  Assessment & Plan by Problem: Almalik Weissberg is a 77 y.o. male with a past medical history of Type II DM, Hyperthyroidism, Hyperlipidemia, PE, and BPH who presents with one-day history of altered mental status and generalized fatigue. Patient endorses multiple-day history of URI symptoms and altered mental status. PCR influenza was negative. Urinalysis showed moderate leukocytes but no nitrites. WBC wnl. Transaminases mildly elevated. CXR showed stable cardiomegaly with normal pulmonary vascularity and no  acute pulmonary disease. Blood cultures are  pending. Patient's current presentation is most consistent with URI most likely of a bacterial etiology.   Acute-Onset Altered Mental Status: He met SIRS criteria based on fever (102.1), tachycardia (90-100), tachypnea (22). Source of infection is currently unknown. Patient has no urinary symptoms or sign of infection on urinalysis. Patient's CXR was unremarkable for any acute pulmonary disease. Patient has no recent GI symptoms. Patient's WBC is wnl. Patient has dementia which likely worsened his altered mental status. Blood cultures are pending.   -Continue IV Vancomycin & Ceftriaxone  -Droplet precautions -Delirium precautions -Orient patient q 4hrs  Diabetes Mellitus The patient's most recent hba1c was 6.2 in March 2018. His home regimen is glimepiride 63m bid. Continue home meds.  Transaminitis On admission to the ED, patient hadan elevation of his AST (130) and ALT (77). Patient's baseline transaminases are wnl historically. Patient's tbili=0.7. INR=1.59 and pt=18.8.  Liver enzymes likely acutely elevated due to stress from current infection. Will continue to monitor.  Essential Hypertension The patient's blood pressure on admission to the ED has ranged from 116-152/76-78. His current home regimen is lisinopril 175mqd, diltiazem 608mid. Continue home meds.  Dementia The patient has Alzheimer's dementia. He currently takes namenda 5mg76m. Continue home meds.  Major Depressive Disorder Patient currently takes zoloft 25mg27m trazodone 50mg 11mContinue home meds.  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 ChundiMaricela Bohe assessment and plan was formulated with their assistance.  Please see their note for official documentation of the patient encounter.   Signed: EltiliJenell Millinercal Student 09/26/2017, 2:34 PM

## 2017-09-26 NOTE — ED Provider Notes (Signed)
[facility] EMERGENCY DEPARTMENT Provider Note   CSN: [id number] Arrival date & time: 09/26/17  0544     History   Chief Complaint Chief Complaint  Patient presents with  . Altered Mental Status  level 5 caveat due to altered mental status  HPI Damian Buckles is a 77 y.o. male.  The history is provided by the patient and the EMS personnel. The history is limited by the condition of the patient.  Altered Mental Status   This is a new problem. Episode onset: Several hours ago. The problem has been gradually worsening. Associated symptoms include confusion. His past medical history is significant for hypertension.   History of previous PE, history of hypertension presents from home for altered mental status.  Patient's wife had reported that he appeared altered, and felt warm to touch.  He also appeared confused, could not find the bathroom and urinated on himself. No other details are known at this time Past Medical History:  Diagnosis Date  . Acute saddle pulmonary embolism ([facility]) 05/20/2016  . Anxiety   . Bilateral swelling of feet   . BPH (benign prostatic hyperplasia)    no meds needed per medical md  . Cataracts, bilateral    immature  . Chronic diarrhea   . Deafness    in left ear   . Depression    takes Zoloft daily  . Diabetes mellitus    takes Actos and Amaryl daily as well as Onglyza  . Dysphagia   . Fatigue   . GERD (gastroesophageal reflux disease)    takes Omeprazole daily  . Headaches, cluster   . Hiatal hernia   . Hyperlipemia    was on meds but hasnt been on anything in about 65months per MD  . Hypertension    takes Amlodipine,HCTZ,and Lisinopril daily  . Hyperthyroidism   . Insomnia    takes Trazodone nightly  . Nocturia   . Osteoarthritis   . Peripheral neuropathy    takes Gabapentin daily  . Pneumonia 3+ yrs ago  . Presbyesophagus   . Rheumatoid arthritis(714.0)   . Vitamin D deficiency    takes Vit D daily    Patient  Active Problem List   Diagnosis Date Noted  . Abnormal loss of weight   . History of snoring 09/20/2016  . Chronic diarrhea 09/20/2016  . Urinary tract infection without hematuria   . Vascular dementia without behavioral disturbance   . Hypokalemia   . Dysphagia   . Chest pain 09/11/2016  . Pulmonary emboli (HCC) 05/20/2016  . Acute saddle pulmonary embolism without acute cor pulmonale (HCC) 05/20/2016  . Pseudogout of right knee 01/13/2015  . DVT of upper extremity (deep vein thrombosis): LEFT 01/11/2015  . Anemia, iron deficiency 01/11/2015  . Postoperative fever   . Fever 01/08/2015  . Diabetes mellitus type 2, controlled (HCC) 01/07/2015  . Essential hypertension 01/07/2015  . SIRS (systemic inflammatory response syndrome) (HCC) 01/07/2015  . Acute encephalopathy   . Primary osteoarthritis of right knee 01/06/2015  . Diabetes (HCC) 01/06/2015  . Morbid obesity (HCC) 01/06/2015  . DIABETIC PERIPHERAL NEUROPATHY 01/05/2010  . HEADACHE 05/07/2009  . UNSPECIFIED VITAMIN D DEFICIENCY 02/03/2009  . LIVER FUNCTION TESTS, ABNORMAL 07/19/2007  . HYPERTHYROIDISM 01/04/2007  . DIABETES MELLITUS, TYPE II 01/04/2007  . HYPERLIPIDEMIA 01/04/2007  . ERECTILE DYSFUNCTION 01/04/2007  . DEPRESSION 01/04/2007  . GERD 01/04/2007  . BENIGN PROSTATIC HYPERTROPHY 01/04/2007  . OSTEOARTHRITIS 01/04/2007    Past Surgical History:  Procedure Laterality Date  .  BACK SURGERY  1998  . BOTOX INJECTION N/A 11/11/2016   Procedure: BOTOX INJECTION;  Surgeon: Sherrilyn Rist, MD;  [facility];  Service: Gastroenterology;  Laterality: N/A;  . COLONOSCOPY WITH ESOPHAGOGASTRODUODENOSCOPY (EGD) AND ESOPHAGEAL DILATION (ED)    . ESOPHAGEAL MANOMETRY N/A 09/14/2016   Procedure: ESOPHAGEAL MANOMETRY (EM);  Surgeon: Ruffin Frederick, MD;  [facility];  Service: Gastroenterology;  Laterality: N/A;  . ESOPHAGOGASTRODUODENOSCOPY N/A 11/11/2016   Procedure: ESOPHAGOGASTRODUODENOSCOPY  (EGD);  Surgeon: Sherrilyn Rist, MD;  [facility];  Service: Gastroenterology;  Laterality: N/A;  . ESOPHAGOGASTRODUODENOSCOPY (EGD) WITH PROPOFOL N/A 09/15/2016   Procedure: ESOPHAGOGASTRODUODENOSCOPY (EGD) WITH PROPOFOL;  Surgeon: Ruffin Frederick, MD;  [facility];  Service: Gastroenterology;  Laterality: N/A;  . IRRIGATION AND DEBRIDEMENT KNEE Right 01/12/2015  . TOTAL KNEE ARTHROPLASTY Right 01/06/2015   Procedure: TOTAL KNEE ARTHROPLASTY;  Surgeon: Marcene Corning, MD;  Location: [facility];  Service: Orthopedics;  Laterality: Right;       Home Medications    Prior to Admission medications   Medication Sig Start Date End Date Taking? Authorizing Provider  acetaminophen (TYLENOL) 500 MG tablet Take 1,000 mg by mouth every 6 (six) hours as needed for mild pain, moderate pain or headache.     [provider]  atorvastatin (LIPITOR) 40 MG tablet Take 40 mg by mouth at bedtime.     [provider]  cetirizine (ZYRTEC) 10 MG tablet Take 10 mg by mouth daily.    [provider]  cholecalciferol (VITAMIN D) 1000 units tablet Take 1,000 Units by mouth 2 (two) times daily.    [provider]  diltiazem (CARDIZEM SR) 60 MG 12 hr capsule Take 60 mg by mouth 2 (two) times daily. 10/24/16   [provider]  esomeprazole (NEXIUM) 20 MG capsule Take 20 mg by mouth daily before breakfast.    [provider]  ferrous sulfate 325 (65 FE) MG tablet Take 325 mg by mouth daily with breakfast.    [provider]  furosemide (LASIX) 20 MG tablet Take 1 tablet (20 mg total) by mouth every Monday, Wednesday, and Friday. Patient taking differently: Take 20 mg by mouth daily.  09/19/16   Albertine Grates, MD  gabapentin (NEURONTIN) 100 MG capsule Take 1 capsule (100 mg total) by mouth at bedtime. Patient taking differently: Take 100 mg by mouth 3 (three) times daily.  09/17/16   Albertine Grates, MD  glimepiride (AMARYL) 4 MG tablet Take 4 mg by  mouth 2 (two) times daily.     [provider]  guaifenesin (HUMIBID E) 400 MG TABS tablet Take 400 mg by mouth every 6 (six) hours as needed (for cough and congestion).    [provider]  lisinopril (PRINIVIL,ZESTRIL) 10 MG tablet Take 10 mg by mouth daily.     [provider]  memantine (NAMENDA) 5 MG tablet Take 1 tablet (5 mg total) by mouth daily. 09/18/16   Albertine Grates, MD  potassium chloride (K-DUR) 10 MEQ tablet Take 10 mEq by mouth daily. 10/22/16   [provider]  ranitidine (ZANTAC) 150 MG tablet Take 150 mg by mouth at bedtime.    [provider]  rivaroxaban (XARELTO) 20 MG TABS tablet Take 1 tablet (20 mg total) by mouth daily with supper. Take with food starting 06/15/2016 Patient taking differently: Take 20 mg by mouth daily. Take with food starting 06/15/2016 06/15/16   Mayo, Allyn Kenner, MD  sertraline (ZOLOFT) 50 MG tablet  Take 50 mg by mouth daily. 10/21/16   [provider]  traMADol (ULTRAM) 50 MG tablet Take 50 mg by mouth every 8 (eight) hours as needed for moderate pain.    [provider]  traZODone (DESYREL) 50 MG tablet Take 50 mg by mouth at bedtime.    [provider]  vitamin E 400 UNIT capsule Take 400 Units by mouth daily.    [provider]    Family History Family History  Problem Relation Age of Onset  . Heart disease Mother   . Diabetes Mother   . Heart attack Father   . Diabetes Brother   . Colon cancer Neg Hx   . Stomach cancer Neg Hx   . Esophageal cancer Neg Hx   . Rectal cancer Neg Hx   . Liver cancer Neg Hx     Social History Social History   Tobacco Use  . Smoking status: Former Smoker    Packs/day: 1.00    Years: 35.00    Pack years: 35.00    Types: Cigarettes    Last attempt to quit: 10/15/2014    Years since quitting: 2.9  . Smokeless tobacco: Never Used  . Tobacco comment: pack last about 2-3wks  Substance Use Topics  . Alcohol use: No  . Drug use: No      Allergies   Aspirin   Review of Systems Review of Systems  Unable to perform ROS: Mental status change  Psychiatric/Behavioral: Positive for confusion.     Physical Exam Updated Vital Signs BP (!) 131/59   Pulse 98   Temp 99.7 F (37.6 C) (Oral)   Resp (!) 22   SpO2 98%  Rectal temp-102 Physical Exam CONSTITUTIONAL: Disheveled, ill-appearing HEAD: Normocephalic/atraumatic EYES: EOMI/PERRL ENMT: Mucous membranes dry NECK: supple no meningeal signs SPINE/BACK:entire spine nontender CV: S1/S2 noted, no murmurs/rubs/gallops noted LUNGS: Lungs are clear to auscultation bilaterally, no apparent distress ABDOMEN: soft, nontender, no rebound or guarding, bowel sounds noted throughout abdomen, obese NEURO: Pt is awake/alert he appears confused, no focal weakness EXTREMITIES: pulses normal/equal, full ROM SKIN: warm, color normal PSYCH: Unable to assess   ED Treatments / Results  Labs (all labs ordered are listed, but only abnormal results are displayed) Labs Reviewed  COMPREHENSIVE METABOLIC PANEL - Abnormal; Notable for the following components:      Result Value   Potassium 3.1 (*)    Glucose, Bld 233 (*)    Creatinine, Ser 1.55 (*)    Albumin 3.3 (*)    AST 130 (*)    ALT 77 (*)    GFR calc non Af Amer 42 (*)    GFR calc Af Amer 48 (*)    All other components within normal limits  CBC WITH DIFFERENTIAL/PLATELET - Abnormal; Notable for the following components:   RBC 4.14 (*)    Hemoglobin 11.6 (*)    HCT 37.5 (*)    RDW 16.5 (*)    Lymphs Abs 0.3 (*)    Monocytes Absolute 0.0 (*)    All other components within normal limits  PROTIME-INR - Abnormal; Notable for the following components:   Prothrombin Time 18.8 (*)    All other components within normal limits  CBG MONITORING, ED - Abnormal; Notable for the following components:   Glucose-Capillary 205 (*)    All other components within normal limits  I-STAT CG4 LACTIC ACID, ED - Abnormal; Notable for  the following components:   Lactic Acid, Venous 2.84 (*)    All  other components within normal limits  CULTURE, BLOOD (ROUTINE X 2)  CULTURE, BLOOD (ROUTINE X 2)  URINE CULTURE  URINALYSIS, ROUTINE W REFLEX MICROSCOPIC  I-STAT CG4 LACTIC ACID, ED    EKG  EKG Interpretation  Date/Time:  Tuesday September 26 2017 05:52:22 EDT Ventricular Rate:  99 PR Interval:    QRS Duration: 93 QT Interval:  378 QTC Calculation: 486 R Axis:   -61 Text Interpretation:  Sinus rhythm Consider left atrial enlargement Left anterior fascicular block Low voltage, precordial leads Consider anterior infarct Interpretation limited secondary to artifact Confirmed by Zadie Rhine ([id number]) on 09/26/2017 6:02:54 AM       Radiology Dg Chest Port 1 View  Result Date: 09/26/2017 CLINICAL DATA:  Chest pain.  Fever. EXAM: PORTABLE CHEST 1 VIEW COMPARISON:  09/16/2016.  09/11/2016.  CT 09/11/2016. FINDINGS: Mediastinum and hilar structures normal. Cardiomegaly with normal pulmonary vascularity. Low no focal infiltrate. Stable left apical pleural calcification. No pleural effusion or pneumothorax. Degenerative change thoracic spine. IMPRESSION: 1.  Stable cardiomegaly. 2.  No acute pulmonary disease. Electronically Signed   By: Maisie Fus  Register   On: 09/26/2017 07:27    Procedures Procedures    Medications Ordered in ED Medications  sodium chloride 0.9 % bolus 1,000 mL (1,000 mLs Intravenous New Bag/Given 09/26/17 0653)    And  sodium chloride 0.9 % bolus 1,000 mL (1,000 mLs Intravenous New Bag/Given 09/26/17 0719)    And  sodium chloride 0.9 % bolus 1,000 mL (not administered)  vancomycin (VANCOCIN) IVPB 1000 mg/200 mL premix (not administered)  piperacillin-tazobactam (ZOSYN) IVPB 3.375 g (not administered)  vancomycin (VANCOCIN) 1,250 mg in sodium chloride 0.9 % 250 mL IVPB (not administered)  acetaminophen (TYLENOL) suppository 650 mg (650 mg Rectal Given 09/26/17 0719)  piperacillin-tazobactam (ZOSYN) IVPB  3.375 g (3.375 g Intravenous New Bag/Given 09/26/17 0652)  vancomycin (VANCOCIN) IVPB 1000 mg/200 mL premix (1,000 mg Intravenous New Bag/Given 09/26/17 0653)     Initial Impression / Assessment and Plan / ED Course  I have reviewed the triage vital signs and the nursing notes.  Pertinent labs & imaging results that were available during my care of the patient were reviewed by me and considered in my medical decision making (see chart for details).     6:36 AM Patient from home for increased confusion.  He was found to be febrile.  Labs/imaging pending at this time 8:01 AM Patient from home for confusion, found to be febrile/septic.  Code sepsis initiated. X-ray reviewed and negative.  He is dehydrated. IV fluids and IV antibiotics ordered. Urinalysis pending at this time.  Signed out to Dr Denton Lank with u/a pending and will need admission  Pt is more alert at this time, just reports generalized weakness  Final Clinical Impressions(s) / ED Diagnoses   Final diagnoses:  Sepsis, due to unspecified organism [facility])    ED Discharge Orders    None       Zadie Rhine, MD 09/26/17 [phone]

## 2017-09-26 NOTE — Progress Notes (Signed)
Received report on pt.

## 2017-09-26 NOTE — H&P (Signed)
Date: 09/26/2017  Patient name: Cray Monnin  Medical record number: [id number]  Date of birth: September 09, 1940   I saw and evaluated the patient. I reviewed the resident's note and I agree with the resident's findings and plan as documented in the resident's note.  Chief Complaint(s): Altered mental status  History - key components related to admission:  Please see resident note for details. History was obtained from Mr. Weidner and his wife, with his wife providing most of the details given Mr. Reindl's dementia.  Briefly, Mr. Fitzwater is a 76 year old male with a history of dementia, HTN, DM, and hypertonic esophageal sphincter requiring prior dilation and Botox injection who presented for altered mental status. His wife reported he was in his usual state of health until the day of admission, when he seemed confused and didn't know how to get to the bathroom. Although he has dementia, he was acting much different than usual. She called EMS to bring him to the hospital. While she was waiting for them to arrive, she noted a few drops of blood draining from his penis.  He does not report any symptoms preceding this illness. Although he has difficulty with swallowing and food getting stuck that is chronic and related to his esophageal motility, neither he nor his wife have noted coughing, choking, or aspiration during eating or drinking.  Physical Exam - key components related to admission:  Vitals:   09/26/17 1300 09/26/17 1330 09/26/17 1515 09/26/17 1646  BP: 133/63 133/64 132/62   Pulse: 70 67 82   Resp: 20 19 (!) 30   Temp:   98.2 F (36.8 C)   TempSrc:   Oral   SpO2: 97% 96% 97%   Weight:    275 lb 11.2 oz (125.1 kg)  Height:    6\' 1"  (1.854 m)    General: Alert, pleasant, not in distress HEENT: Moist mucous membranes Neck: No JVD, no lymphadenopathy Cardiovascular: Regular rate and rhythm, no murmurs rubs or gallops Pulmonary: Clear to auscultation bilaterally, normal work of  breathing Abdominal: Soft, obese, nontender, not distended, normal bowel sounds Extremities: Warm, trace edema Skin: No significant rashes or lesions Neuro: Alert and oriented, speech normal  Significant test results: Lactate 2.8->2.4->3.6 Creatinine 1.6 AST/ALT 130/77 INR 1.6 WBC 7.0 (95% neutrophils) Urinalysis moderate LE, negative nitrite, 6-30 WBC, 6-30 RBC, 0-5 squamous Blood and urine cultures pending Influenza negative  CXR with no acute disease EKG difficult to interpret due to artifact, but no apparent signs of ischemia  Assessment and Plan: I have seen and evaluated the patient as outlined in the resident's note. I agree with the formulated Assessment and Plan as detailed in the resident's note, with the following changes:   Principal Problem:   Severe sepsis (HCC) Active Problems:   Diabetes mellitus type 2, controlled (HCC)   Acute encephalopathy   UTI (urinary tract infection)   1. Severe sepsis: Likely due to urinary tract infection. Etiology is a bit difficult to determine given his inability to give much of a history and no overt symptoms noted by his wife other than gross hematuria and confusion. Although aspiration is a concern given his esophageal history, he has a normal lung exam and CXR. Urinalysis shows moderate LE but negative nitrites and only 6-30 WBCs. Labs are also significant for AKI, elevated transaminases, and elevated lactate without acidosis. LFTs raise concern for hepatitis or cholangitis, but abdomen is soft and not at all tender.  Persistent lactate is concerning, but his exam and vital signs  are reassuring. His tachycardia has resolved, his blood pressure is normal, and on exam he is alert and in no distress with well-perfused extremities. His wife reports he seems back to baseline. He has already received 4.5 L of normal saline, and he appears adequately resuscitated at this time. We will continue to trend the lactate and ensure it clears with  time, while using primarily clinical features such as urine output and vital signs to evaluate resuscitation.  -Continue broad-spectrum antibiotics with vancomycin and piperacillin/tazobactam -Follow-up blood and urine cultures  2. AKI: Likely related to sepsis and poor perfusion, will ensure adequately resuscitated as above and trend creatinine  Remainder as per resident note  Anne Shutter, MD 3/19/20199:05 PM

## 2017-09-26 NOTE — H&P (Addendum)
Date: 09/26/2017               Patient Name:  Andrew Winters MRN: [id number]  DOB: 11-04-1940 Age / Sex: 77 y.o., male   PCP: Townsend Roger, MD         Medical Service: Internal Medicine Teaching Service         Attending Physician: Dr. Rebeca Alert Raynaldo Opitz, MD    First Contact: Dr. Lars Mage Pager: [phone]  Second Contact: Dr. Einar Gip  Pager: [phone]       After Hours (After 5p/  First Contact Pager: [phone]  weekends / holidays): Second Contact Pager: [phone]   Chief Complaint: Altered Mental Status   History of Present Illness: Andrew Winters is a 77 y.o m with essential hypertension, diabetes mellitus, dementia on namenda, bph, peripheral neuropathy, and  primary osteoarthritis of right knee who presented with one day history of encephalopathy, generalized weakness, and anxiety. The patient states that over the past few days his wife has noticed that he has been acting differently and therefore encouraged him to come to the hospital. He states that he has been having sore throat, sneezing, and runny nose. He mentioned that he also has hematuria, fatigue, and decreased appetite. He has not had any sick contacts recently and he has had his flu shot.  He denies cough, sob, chest pain, nausea/vomiting.   ED Course: Febrile to 102.1, tachypnea=22, normotensive, normal saturation, mild tachycardia (90-100s) IV Vancomycin and zosyn, 3L bolus Code sepsis initiated   Meds:  No outpatient medications have been marked as taking for the 09/26/17 encounter [facility] Encounter).    Allergies: Allergies as of 09/26/2017 - Review Complete 09/26/2017  Allergen Reaction Noted  . Aspirin Diarrhea 12/30/2010   Past Medical History:  Diagnosis Date  . Acute saddle pulmonary embolism ([place]) 05/20/2016  . Anxiety   . Bilateral swelling of feet   . BPH (benign prostatic hyperplasia)    no meds needed per medical md  . Cataracts, bilateral    immature  . Chronic diarrhea   .  Deafness    in left ear   . Depression    takes Zoloft daily  . Diabetes mellitus    takes Actos and Amaryl daily as well as Onglyza  . Dysphagia   . Fatigue   . GERD (gastroesophageal reflux disease)    takes Omeprazole daily  . Headaches, cluster   . Hiatal hernia   . Hyperlipemia    was on meds but hasnt been on anything in about 67month per MD  . Hypertension    takes Amlodipine,HCTZ,and Lisinopril daily  . Hyperthyroidism   . Insomnia    takes Trazodone nightly  . Nocturia   . Osteoarthritis   . Peripheral neuropathy    takes Gabapentin daily  . Pneumonia 3+ yrs ago  . Presbyesophagus   . Rheumatoid arthritis(714.0)   . Vitamin D deficiency    takes Vit D daily    Family History:   Family History  Problem Relation Age of Onset  . Heart disease Mother   . Diabetes Mother   . Heart attack Father   . Diabetes Brother   . Colon cancer Neg Hx   . Stomach cancer Neg Hx   . Esophageal cancer Neg Hx   . Rectal cancer Neg Hx   . Liver cancer Neg Hx    Social History:   Social History   Socioeconomic History  . Marital status: Married  Spouse name: None  . Number of children: 3  . Years of education: None  . Highest education level: None  Social Needs  . Financial resource strain: None  . Food insecurity - worry: None  . Food insecurity - inability: None  . Transportation needs - medical: None  . Transportation needs - non-medical: None  Occupational History  . Occupation: Retired   Tobacco Use  . Smoking status: Former Smoker    Packs/day: 1.00    Years: 35.00    Pack years: 35.00    Types: Cigarettes    Last attempt to quit: 10/15/2014    Years since quitting: 2.9  . Smokeless tobacco: Never Used  . Tobacco comment: pack last about 2-3wks  Substance and Sexual Activity  . Alcohol use: No  . Drug use: No  . Sexual activity: Not Currently  Other Topics Concern  . None  Social History Narrative  . None   Review of Systems: A complete ROS was  negative except as per HPI.   Physical Exam: Blood pressure 133/63, pulse 70, temperature (!) 100.4 F (38 C), temperature source Oral, resp. rate 20, SpO2 97 %.  Physical Exam  Constitutional: He appears well-developed and well-nourished. No distress.  HENT:  Head: Normocephalic and atraumatic.  Mouth/Throat: Oropharynx is clear and moist. No oropharyngeal exudate.  Eyes: Conjunctivae are normal.  Cardiovascular: Normal rate, regular rhythm and normal heart sounds.  Respiratory: Effort normal and breath sounds normal. No respiratory distress. He has no wheezes.  GI: Soft. Bowel sounds are normal. He exhibits no distension. There is no tenderness.  Musculoskeletal: He exhibits no edema.  Neurological: He is alert.  Skin: No rash noted. He is not diaphoretic. No erythema.  Good capillary refill, no skin or soft tissue lesions  Psychiatric: He has a normal mood and affect. His behavior is normal. Judgment and thought content normal.   Urinalysis    Component Value Date/Time   COLORURINE YELLOW 09/26/2017 1220   APPEARANCEUR HAZY (A) 09/26/2017 1220   LABSPEC 1.015 09/26/2017 1220   PHURINE 5.0 09/26/2017 1220   GLUCOSEU NEGATIVE 09/26/2017 1220   HGBUR LARGE (A) 09/26/2017 1220   BILIRUBINUR NEGATIVE 09/26/2017 1220   BILIRUBINUR negative 10/08/2015 1925   KETONESUR NEGATIVE 09/26/2017 1220   PROTEINUR NEGATIVE 09/26/2017 1220   UROBILINOGEN 1.0 10/08/2015 1925   UROBILINOGEN 0.2 01/07/2015 2223   NITRITE NEGATIVE 09/26/2017 1220   LEUKOCYTESUR MODERATE (A) 09/26/2017 1220    CBC Latest Ref Rng & Units 09/26/2017 09/16/2016 09/11/2016  WBC 4.0 - 10.5 K/uL 7.0 5.4 9.7  Hemoglobin 13.0 - 17.0 g/dL 11.6(L) 10.4(L) 10.5(L)  Hematocrit 39.0 - 52.0 % 37.5(L) 33.8(L) 33.7(L)  Platelets 150 - 400 K/uL 153 235 179   BMP Latest Ref Rng & Units 09/26/2017 09/17/2016 09/16/2016  Glucose 65 - 99 mg/dL 233(H) 130(H) 129(H)  BUN 6 - 20 mg/dL 15 5(L) 6  Creatinine 0.61 - 1.24 mg/dL 1.55(H) 0.92  0.86  Sodium 135 - 145 mmol/L 142 141 142  Potassium 3.5 - 5.1 mmol/L 3.1(L) 3.8 3.1(L)  Chloride 101 - 111 mmol/L 106 111 112(H)  CO2 22 - 32 mmol/L '24 25 25  ' Calcium 8.9 - 10.3 mg/dL 9.0 8.6(L) 8.6(L)   Lactic Acid, Venous    Component Value Date/Time   LATICACIDVEN 3.6 (HH) 09/26/2017 1459    Blood culture: pending  EKG: personally reviewed my interpretation is normal sinus rhythm  CXR: personally reviewed my interpretation is no consolidation or  pleural effusions.  Assessment & Plan by Problem:  Acute Encephalopathy The patient presented with one day history of acute encephalopathy. He met SIRS criteria based on fever (102.1), tachycardia (90-100), tachypnea (22). Source of infection remains unknown as he does not have any skin/soft tissue infections, nitrites/leukocytes on ua, chest xray unremarkable for infiltrate. He does not have leukocytosis. Patient's lactic acid increased from 2.84, 2.44, 3.6.  Reviewed the patient's medication for medications on beer's list or for centrally acting medications. He is on gabapentin and cetirizine at home which is being held during this admission.  He currently does not have any significant metabolic derangements which can explain his encephalopathy.   He has dementia at baseline which is worsened by the acute encephalopathy.   -PCR influenza negative -RVP pending -Droplet precautions -Delirium precautions -Orient patient q 4hrs -Trending lactic acid  Transaminitis The patient has an elevation of his AST (130) and ALT (77) which is at 2:1 ratio which is acutely elevated.  His total protein is normal at 6.56 and tbili=0.7. INR=1.59 and pt=18.8. Patient's transaminitis is likely secondary to patient's acute stress state.   Acute Kidney Injury The patient's creatinine on admission was 1.55 from baseline of 0.8-1.19. The patient's aki is likely secondary to poor po intake.   -Order morning bmp  Diabetes Mellitus The patient's last  a1c was 6.2 in March 2018. She is currently on home glimepiride 94m bid.   -SSI   Essential Hypertension The patient's blood pressure since admission has been well controlled ranging 116-152/76-78. She is on lisinopril 175mqd, diltiazem 6060mid.   -Will continue to monitor blood pressure  Dementia The patient has dementia at baseline for which she is on namenda 5mg20m.   Major Depressive Disorder Will continue home zoloft 25mg35m trazodone 50mg 31m Dispo: Admit patient to Inpatient with expected length of stay greater than 2 midnights.  Signed: ChunLars Mage/19/2019, 1:24 PM  Pager: [phone]

## 2017-09-26 NOTE — ED Triage Notes (Signed)
Pt coming from home. Pt wife called ems due to pt being altered. Pt stated he needed to use the bathroom but couldn't find the bathroom which is unusual for him. Pt does have hx of dementia. Pt also felt hot to the touch according to wife. Ems checked it and it was 100.2 temporal.

## 2017-09-26 NOTE — ED Notes (Signed)
ED Provider at bedside. 

## 2017-09-26 NOTE — ED Notes (Signed)
Admitting MD at bedside.

## 2017-09-26 NOTE — Progress Notes (Signed)
CRITICAL VALUE ALERT  Critical Value:  Lactic acid 3.9  Date & Time Notified:  09/26/17  9:04 PM   Provider Notified: Dr. Frances Furbish  Orders Received/Actions taken: No new orders at this time. Pt currently receiving fluid bolus of normal saline 1000 ml at 250 ml/hr. Will continue to monitor.

## 2017-09-27 DIAGNOSIS — E119 Type 2 diabetes mellitus without complications: Secondary | ICD-10-CM

## 2017-09-27 DIAGNOSIS — R197 Diarrhea, unspecified: Secondary | ICD-10-CM

## 2017-09-27 DIAGNOSIS — K219 Gastro-esophageal reflux disease without esophagitis: Secondary | ICD-10-CM

## 2017-09-27 LAB — RESPIRATORY PANEL BY PCR

## 2017-09-27 LAB — COMPREHENSIVE METABOLIC PANEL
ALT: 45 U/L (ref 17–63)
AST: 55 U/L — ABNORMAL HIGH (ref 15–41)
Albumin: 2.8 g/dL — ABNORMAL LOW (ref 3.5–5.0)
Alkaline Phosphatase: 77 U/L (ref 38–126)
Anion gap: 10 (ref 5–15)
BUN: 13 mg/dL (ref 6–20)
CO2: 24 mmol/L (ref 22–32)
Calcium: 8.3 mg/dL — ABNORMAL LOW (ref 8.9–10.3)
Chloride: 109 mmol/L (ref 101–111)
Creatinine, Ser: 1.29 mg/dL — ABNORMAL HIGH (ref 0.61–1.24)
GFR calc Af Amer: >60 mL/min (ref >60)
GFR calc non Af Amer: 52 mL/min — ABNORMAL LOW (ref >60)
Glucose, Bld: 106 mg/dL — ABNORMAL HIGH (ref 65–99)
Potassium: 4 mmol/L (ref 3.5–5.1)
Sodium: 143 mmol/L (ref 135–145)
Total Bilirubin: 0.9 mg/dL (ref 0.3–1.2)
Total Protein: 6 g/dL — ABNORMAL LOW (ref 6.5–8.1)

## 2017-09-27 LAB — C DIFFICILE QUICK SCREEN W PCR REFLEX
C Diff antigen: NEGATIVE
C Diff interpretation: NOT DETECTED
C Diff toxin: NEGATIVE

## 2017-09-27 LAB — URINE CULTURE: Culture: NO GROWTH

## 2017-09-27 LAB — GLUCOSE, CAPILLARY
Glucose-Capillary: 91 mg/dL (ref 65–99)
Glucose-Capillary: 167 mg/dL — ABNORMAL HIGH (ref 65–99)
Glucose-Capillary: 157 mg/dL — ABNORMAL HIGH (ref 65–99)
Glucose-Capillary: 150 mg/dL — ABNORMAL HIGH (ref 65–99)
Glucose-Capillary: 161 mg/dL — ABNORMAL HIGH (ref 65–99)

## 2017-09-27 LAB — LACTIC ACID, PLASMA: Lactic Acid, Venous: 1.1 mmol/L (ref 0.5–1.9)

## 2017-09-27 MED ORDER — SODIUM CHLORIDE 0.9 % IV SOLN
INTRAVENOUS | Status: AC
  Administered 2017-09-27 (×2): via INTRAVENOUS

## 2017-09-27 NOTE — Progress Notes (Signed)
   Subjective: Andrew Winters was seen resting in his bed thismorning with his wife at bedside. The patient appeared to be oriented and was answer the provider's questions appropriately. The patient's wife stated that he appears closer to baseline today.  Objective:  Vital signs in last 24 hours: Vitals:   09/26/17 1515 09/26/17 1646 09/26/17 2105 09/27/17 0553  BP: 132/62  (!) 131/56 (!) 119/96  Pulse: 82  68 71  Resp: (!) 30  17 16   Temp: 98.2 F (36.8 C)  98.4 F (36.9 C) 97.9 F (36.6 C)  TempSrc: Oral  Oral Oral  SpO2: 97%  98% 94%  Weight:  275 lb 11.2 oz (125.1 kg)    Height:  6\' 1"  (1.854 m)     Physical Exam  Constitutional: He appears well-developed and well-nourished. No distress.  HENT:  Head: Normocephalic and atraumatic.  Eyes: Conjunctivae are normal.  Cardiovascular: Normal rate, regular rhythm and normal heart sounds.  Respiratory: Effort normal and breath sounds normal. No respiratory distress. He has no wheezes.  GI: Soft. Bowel sounds are normal. He exhibits no distension. There is no tenderness.  Musculoskeletal: He exhibits no edema.  Neurological: He is alert.  Skin: He is not diaphoretic. No erythema.  Psychiatric: He has a normal mood and affect. His behavior is normal. Judgment and thought content normal.   Assessment/Plan:  Acute Encephalopathy The patient appears more closer to baseline today according to the patient's wife. He is afebrile and does not have tachypnea or tachycardia today.   The patient's acute encephalopathy was thought to be due to a uti as he had moderate amount of leukocytes noted on ua and he had some hematuria. However, the ua did have some sq epith which makes it a poor sample. Also his urine culture was negative, however he was given abx before collecting urine.  Patient's RVP was negative. Will check if the patient has a gastrointestinal infection as the patient has had 4 episodes of documented diarrhea in the hospital. He is  c diff negative.   -GI panel pending  -Delirium precautions -Orient patient q 4hrs -Trending lactic acid -Pending blood cutlure -Continue IV vanc and zosyn for now as still do not know source  Transaminitis The patient has an elevation of his AST (130) and ALT (77) which is at 2:1 ratio which is acutely elevated.  His liver enzymes are downtrending (ast=55 and alt=45 today 3/20)  -Patient's transaminitis is likely secondary to patient's acute stress state.   Acute Kidney Injury The patient's creatinine is downtrending today at 1.29 from his cr of 1.55 on admission. Baseline of 0.8-1.19. The patient's aki is likely secondary to poor po intake.   -Will continue to monitor kidney function   Diabetes Mellitus The patient's last a1c was 6.2 in March 2018. His blood glucose has ranged between 91-167.   -SSI   Essential Hypertension The patient's blood pressure since admission has been well controlled ranging 116-152/76-78. She is on lisinopril 10mg  qd, diltiazem 60mg  bid.   -Will continue to monitor blood pressure  Dementia The patient has dementia at baseline for which she is on namenda 5mg  qd.   Major Depressive Disorder Will continue home zoloft 25mg  qd, trazodone 50mg  qd   Dispo: Anticipated discharge in approximately 1 day.   4/20, MD 09/27/2017, 1:56 PM Pager: [phone]

## 2017-09-27 NOTE — Progress Notes (Addendum)
  Date: 09/27/2017  Patient name: Andrew Winters  Medical record number: [id number]  Date of birth: [birth year]   I have seen and evaluated this patient and I have discussed the plan of care with the house staff. Please see their note for complete details. I concur with their findings with the following additions/corrections:   Well-appearing today, his wife reports he is back to baseline with no symptoms other than chronic GERD pain. He presented with severe sepsis with fever, AKI, AMS, transaminemia, and elevated lactate, suspected source UTI. Urine culture is negative, but was collected hours after antibiotics administered. Blood cultures no growth thus far. He has continued to have diarrhea, C diff negative but GI pathogen panel pending. Highest suspicion is still for UTI, has had an additional episode of gross hematuria. Will likely need to treat with empiric course of antibiotics, and will need to follow up to evaluate for persistent hematuria that would require further workup. AKI and LFTs improving after fluids, no further fevers, well perfused and well hydrated on exam.  Jessy Oto, M.D., Ph.D. 09/27/2017, 5:42 PM

## 2017-09-28 LAB — BASIC METABOLIC PANEL
Anion gap: 9 (ref 5–15)
BUN: 9 mg/dL (ref 6–20)
CO2: 20 mmol/L — ABNORMAL LOW (ref 22–32)
Calcium: 8.4 mg/dL — ABNORMAL LOW (ref 8.9–10.3)
Chloride: 111 mmol/L (ref 101–111)
Creatinine, Ser: 1.07 mg/dL (ref 0.61–1.24)
GFR calc Af Amer: >60 mL/min (ref >60)
GFR calc non Af Amer: >60 mL/min (ref >60)
Glucose, Bld: 159 mg/dL — ABNORMAL HIGH (ref 65–99)
Potassium: 3.7 mmol/L (ref 3.5–5.1)
Sodium: 140 mmol/L (ref 135–145)

## 2017-09-28 LAB — CBC
HCT: 36.3 % — ABNORMAL LOW (ref 39.0–52.0)
Hemoglobin: 11.5 g/dL — ABNORMAL LOW (ref 13.0–17.0)
MCH: 28.2 pg (ref 26.0–34.0)
MCHC: 31.7 g/dL (ref 30.0–36.0)
MCV: 89 fL (ref 78.0–100.0)
Platelets: 172 10*3/uL (ref 150–400)
RBC: 4.08 MIL/uL — ABNORMAL LOW (ref 4.22–5.81)
RDW: 16.6 % — ABNORMAL HIGH (ref 11.5–15.5)
WBC: 12.9 10*3/uL — ABNORMAL HIGH (ref 4.0–10.5)

## 2017-09-28 LAB — GLUCOSE, CAPILLARY
Glucose-Capillary: 157 mg/dL — ABNORMAL HIGH (ref 65–99)
Glucose-Capillary: 181 mg/dL — ABNORMAL HIGH (ref 65–99)
Glucose-Capillary: 152 mg/dL — ABNORMAL HIGH (ref 65–99)

## 2017-09-28 MED ORDER — LISINOPRIL 10 MG PO TABS
10.0000 mg | ORAL_TABLET | Freq: Every day | ORAL | Status: DC
  Administered 2017-09-28: 10 mg via ORAL
  Filled 2017-09-28: qty 1

## 2017-09-28 MED ORDER — CEFDINIR 300 MG PO CAPS: 300.0000 mg | ORAL_CAPSULE | Freq: Two times a day (BID) | ORAL | 0 refills | Status: AC

## 2017-09-28 MED ORDER — GABAPENTIN 100 MG PO CAPS: 200.0000 mg | ORAL_CAPSULE | Freq: Every day | ORAL | Status: DC

## 2017-09-28 MED ORDER — HYDRALAZINE HCL 10 MG PO TABS
10.0000 mg | ORAL_TABLET | Freq: Once | ORAL | Status: AC
Start: 2017-09-28 — End: 2017-09-28
  Administered 2017-09-28: 10 mg via ORAL
  Filled 2017-09-28: qty 1

## 2017-09-28 MED ORDER — GABAPENTIN 100 MG PO CAPS: 100.0000 mg | ORAL_CAPSULE | Freq: Every morning | ORAL | Status: DC

## 2017-09-28 MED ORDER — CEFDINIR 300 MG PO CAPS: 300.0000 mg | ORAL_CAPSULE | Freq: Two times a day (BID) | ORAL | 0 refills | Status: DC

## 2017-09-28 NOTE — Evaluation (Signed)
Physical Therapy Evaluation Patient Details Name: Andrew Winters MRN: [id number] DOB: 08/06/1940 Today's Date: 09/28/2017   History of Present Illness  Pt admitted secondary to severe sepsis from suspected UTI. Pt also with AMS. Pt with PMH of Dementia, HTN, DM, RA, PE, COPD, peripheral neuropathy, L ear deafness, and R TKA.   Clinical Impression  Pt admitted secondary to problem above with deficits below. Pt tolerated gait with cane well and requiring overall min guard A throughout. Presenting with LE weakness and mild unsteadiness. Pt requiring some safety cues secondary to baseline dementia. Pt reports wife will be able to assist as needed. Will continue to follow acutely to maximize functional mobility independence and safety.     Follow Up Recommendations Home health PT;Supervision/Assistance - 24 hour    Equipment Recommendations  None recommended by PT    Recommendations for Other Services       Precautions / Restrictions Precautions Precautions: Fall Restrictions Weight Bearing Restrictions: No      Mobility  Bed Mobility               General bed mobility comments: Sitting EOB upon entering  Transfers Overall transfer level: Needs assistance Equipment used: Straight cane Transfers: Sit to/from Stand Sit to Stand: Min guard;From elevated surface         General transfer comment: Min guard for safety from elevated surface.   Ambulation/Gait Ambulation/Gait assistance: Min guard Ambulation Distance (Feet): 175 Feet Assistive device: Straight cane Gait Pattern/deviations: Step-through pattern;Decreased stride length Gait velocity: Decreased  Gait velocity interpretation: Below normal speed for age/gender General Gait Details: Slow, mildly unsteady gait, however, no overt LOB noted. Demonstrated proper use of cane with ambulation.   Stairs            Wheelchair Mobility    Modified Rankin (Stroke Patients Only)       Balance Overall balance  assessment: Needs assistance Sitting-balance support: No upper extremity supported;Feet supported Sitting balance-Leahy Scale: Good     Standing balance support: Single extremity supported;During functional activity Standing balance-Leahy Scale: Poor Standing balance comment: Reliant on LUE support                              Pertinent Vitals/Pain Pain Assessment: No/denies pain    Home Living Family/patient expects to be discharged to:: Private residence Living Arrangements: Spouse/significant other Available Help at Discharge: Family;Available 24 hours/day Type of Home: House Home Access: Ramped entrance     Home Layout: One level Home Equipment: Walker - 2 wheels;Cane - single point;Bedside commode      Prior Function Level of Independence: Independent with assistive device(s)         Comments: Reports using cane at baseline.      Hand Dominance        Extremity/Trunk Assessment   Upper Extremity Assessment Upper Extremity Assessment: Overall WFL for tasks assessed    Lower Extremity Assessment Lower Extremity Assessment: Generalized weakness    Cervical / Trunk Assessment Cervical / Trunk Assessment: Kyphotic  Communication   Communication: No difficulties  Cognition Arousal/Alertness: Awake/alert Behavior During Therapy: WFL for tasks assessed/performed Overall Cognitive Status: History of cognitive impairments - at baseline                                 General Comments: PMH of dementia and per notes, pt close to baseline cognition  General Comments      Exercises     Assessment/Plan    PT Assessment Patient needs continued PT services  PT Problem List Decreased strength;Decreased balance;Decreased mobility;Decreased cognition       PT Treatment Interventions DME instruction;Gait training;Functional mobility training;Therapeutic activities;Therapeutic exercise;Balance training;Neuromuscular  re-education;Patient/family education    PT Goals (Current goals can be found in the Care Plan section)  Acute Rehab PT Goals Patient Stated Goal: to go home  PT Goal Formulation: With patient Time For Goal Achievement: 10/12/17 Potential to Achieve Goals: Good    Frequency Min 3X/week   Barriers to discharge        Co-evaluation               AM-PAC PT "6 Clicks" Daily Activity  Outcome Measure Difficulty turning over in bed (including adjusting bedclothes, sheets and blankets)?: A Little Difficulty moving from lying on back to sitting on the side of the bed? : A Little Difficulty sitting down on and standing up from a chair with arms (e.g., wheelchair, bedside commode, etc,.)?: Unable Help needed moving to and from a bed to chair (including a wheelchair)?: A Little Help needed walking in hospital room?: A Little Help needed climbing 3-5 steps with a railing? : A Lot 6 Click Score: 15    End of Session Equipment Utilized During Treatment: Gait belt Activity Tolerance: Patient tolerated treatment well Patient left: in chair;with call bell/phone within reach;with chair alarm set;with nursing/sitter in room Nurse Communication: Mobility status PT Visit Diagnosis: Unsteadiness on feet (R26.81);Muscle weakness (generalized) (M62.81)    Time: 9030-0923 PT Time Calculation (min) (ACUTE ONLY): 23 min   Charges:   PT Evaluation $PT Eval Low Complexity: 1 Low PT Treatments $Gait Training: 8-22 mins   PT G Codes:        Gladys Damme, PT, DPT  Acute Rehabilitation Services  Pager: [phone]   Lehman Prom 09/28/2017, 2:19 PM

## 2017-09-28 NOTE — Discharge Instructions (Signed)
It was a pleasure to take care of you Mr. Andrew Winters. During your hospitalization you were taken care of for altered mental status that was thought to be caused by a urinary tract infection. Please take two days of cefdinir 300mg  twice daily. Please continue taking all of your other medication.   Confusion Confusion is the inability to think with your usual speed or clarity. Confusion may come on quickly or slowly over time. How quickly the confusion comes on depends on the cause. Confusion can be due to any number of causes. What are the causes?  Concussion, head injury, or head trauma.  Seizures.  Stroke.  Fever.  Brain tumor.  Age related decreased brain function (dementia).  Heightened emotional states like rage or terror.  Mental illness in which the person loses the ability to determine what is real and what is not (hallucinations).  Infections such as a urinary tract infection (UTI).  Toxic effects from alcohol, drugs, or prescription medicines.  Dehydration and an imbalance of salts in the body (electrolytes).  Lack of sleep.  Low blood sugar (diabetes).  Low levels of oxygen from conditions such as chronic lung disorders.  Drug interactions or other medicine side effects.  Nutritional deficiencies, especially niacin, thiamine, vitamin C, or vitamin B.  Sudden drop in body temperature (hypothermia).  Change in routine, such as when traveling or hospitalized. What are the signs or symptoms? People often describe their thinking as cloudy or unclear when they are confused. Confusion can also include feeling disoriented. That means you are unaware of where or who you are. You may also not know what the date or time is. If confused, you may also have difficulty paying attention, remembering, and making decisions. Some people also act aggressively when they are confused. How is this diagnosed? The medical evaluation of confusion may include:  Blood and urine  tests.  X-rays.  Brain and nervous system tests.  Analyzing your brain waves (electroencephalogram or EEG).  Magnetic resonance imaging (MRI) of your head.  Computed tomography (CT) scan of your head.  Mental status tests in which your health care provider may ask many questions. Some of these questions may seem silly or strange, but they are a very important test to help diagnose and treat confusion.  How is this treated? An admission to the hospital may not be needed, but a person with confusion should not be left alone. Stay with a family member or friend until the confusion clears. Avoid alcohol, pain relievers, or sedative drugs until you have fully recovered. Do not drive until directed by your health care provider. Follow these instructions at home: What family and friends can do:  To find out if someone is confused, ask the person to state his or her name, age, and the date. If the person is unsure or answers incorrectly, he or she is confused.  Always introduce yourself, no matter how well the person knows you.  Often remind the person of his or her location.  Place a calendar and clock near the confused person.  Help the person with his or her medicines. You may want to use a pill box, an alarm as a reminder, or give the person each dose as prescribed.  Talk about current events and plans for the day.  Try to keep the environment calm, quiet, and peaceful.  Make sure the person keeps follow-up visits with his or her health care provider.  How is this prevented? Ways to prevent confusion:  Avoid alcohol.  Eat a balanced diet.  Get enough sleep.  Take medicine only as directed by your health care provider.  Do not become isolated. Spend time with other people and make plans for your days.  Keep careful watch on your blood sugar levels if you are diabetic.  Get help right away if:  You develop severe headaches, repeated vomiting, seizures, blackouts, or  slurred speech.  There is increasing confusion, weakness, numbness, restlessness, or personality changes.  You develop a loss of balance, have marked dizziness, feel uncoordinated, or fall.  You have delusions, hallucinations, or develop severe anxiety.  Your family members think you need to be rechecked. This information is not intended to replace advice given to you by your health care provider. Make sure you discuss any questions you have with your health care provider. Document Released: 08/04/2004 Document Revised: 01/15/2016 Document Reviewed: 08/02/2013 Elsevier Interactive Patient Education  [employer].

## 2017-09-28 NOTE — Consult Note (Signed)
[facility] CM Primary Care Navigator  09/28/2017  Sovereign Ramiro Mar 12, 1941 [id number]   Met with patientat the bedside and spoke to wife Katharine Look) over the phone to identify possible discharge needs. Wifereports that patient "had fever, increased confusion and passed blood in his urine" that resultedto this admission. (severe sepsis, UTI)  Patient endorses Dr.Jason Nona Dell with [facility] in [place] as hisprimary care provider.   [facility] [place] [facility] [place] toobtain medications without difficulty.  Patientverbalized that wife has been managing his medications at homewithuseof"pill box" system filled every week.  Patientreportsthatwife has been driving and providing transportation to his doctors'appointmentssince he no longer drives.   His wife is the primary caregiver for patient at home.  Anticipated discharge plan is home with home health services per therapy recommendation.  Patient and wife voiced understanding to call primary care provider's office whenhe returns home for a post discharge follow-up appointment within1- 2 weeksor sooner if needs arise.Patient letter (with PCP's contact number) was provided as their reminder.  Discussed withpatient and wiferegarding [facility] CM services available for healthmanagement at home. Patient's wifestatesthatshe helps patient in managing his health issues at home. She verbalized that there are no pressing needs or concerns at this point and will wait to talk to provider on his next visit. Wife mentioned that patient has go back to see his primary care provider after discharge since provider was considering to make diabetes medication changes/ adjustments prior to admission.  Patient and wife expressed understanding to seekreferral to [facility] care managementfrom primary care providerif deemed necessary andappropriate for  any services in thenearfuture. [facility] care management information provided for future needs thathe may have.   Patient/ wife would onlyopt andverbally agree for EMMI calls to follow-up with hisrecovery at home.   Referral [facility] calls after discharge.   For additional questions please contact:  Edwena Felty A. Shyla Gayheart, BSN, RN-BC [facility] PRIMARY CARE Navigator Cell: [phone]

## 2017-09-28 NOTE — Progress Notes (Signed)
Patient BP is 184/80.  Notified MD.  Will continue to monitor

## 2017-09-28 NOTE — Progress Notes (Signed)
   Subjective: Mr. Tawil was seen resting comfortably in his bed this morning. He mentioned that he does not have any difficulty breathing, chest discomfort, or abdominal pain. He was slightly hypertensive overnight and managed with hydralazine.   Objective:  Vital signs in last 24 hours: Vitals:   09/27/17 0553 09/27/17 1435 09/27/17 2152 09/28/17 0600  BP: (!) 119/96 (!) 142/74 (!) 155/70 (!) 184/80  Pulse: 71 66 64 (!) 57  Resp: 16 16 (!) 24 20  Temp: 97.9 F (36.6 C) 98 F (36.7 C) 98.4 F (36.9 C) 98.3 F (36.8 C)  TempSrc: Oral Oral    SpO2: 94% 97% 95% 94%  Weight:      Height:       Physical Exam  Constitutional: He appears well-developed and well-nourished. No distress.  HENT:  Head: Normocephalic and atraumatic.  Eyes: Conjunctivae are normal.  Cardiovascular: Normal rate, regular rhythm and normal heart sounds.  No murmur heard. Respiratory: Effort normal and breath sounds normal. No respiratory distress. He has no wheezes.  GI: Soft. Bowel sounds are normal. He exhibits no distension. There is no tenderness.  Musculoskeletal: He exhibits no edema.  Neurological: He is alert.  Skin: He is not diaphoretic. No erythema.  Psychiatric: He has a normal mood and affect. His behavior is normal. Judgment and thought content normal.   Assessment/Plan:  Acute Encephalopathy The patient was oriented and at baseline today according to his wife. The patient's acute encephalopathy is thought to be secondary to urinary tract infection as noted by hematuria and moderate amount of leukocytes. Urine culture remains negative however. The patient's blood cultures are no growth for 2 days. Will discharge patient with 4 additional days of cefdinir for a total antibiotic duration of 7 days.  The patient has continued to be afebrile, but his wbc has elevated from 7 to 12.9 today. Leukocytosis is likely reactionary.  -GI panel pending  -Blood culture no growth at 2 days, will monitor  till 5 days  Acute Kidney Injury Resolved with cr=1.07 today. Baseline of 0.8-1.19. The patient's aki is likely secondary to poor po intake.   -Will continue to monitor kidney function   Diabetes Mellitus The patient's last a1c was 6.2 in March 2018. His blood glucose has ranged between 157-181.   -SSI   Essential Hypertension The patient's blood pressure since admission has been well controlled ranging 151-181/71-75. She is on lisinopril 10mg  qd, diltiazem 60mg  bid.   -Resumed home lisinopril 10mg  qd, was given one dose hydralazine 10mg  qd this morning by night team   Dementia The patient has dementia at baseline for which she is on namenda 5mg  qd.   Major Depressive Disorder Will continue home zoloft 25mg  qd, trazodone 50mg  qd   Dispo: Anticipated discharge in approximately today.   , MD 09/28/2017, 7:00 AM Pager: [phone]

## 2017-10-01 LAB — CULTURE, BLOOD (ROUTINE X 2)
CULTURE: NO GROWTH
CULTURE: NO GROWTH
SPECIAL REQUESTS: ADEQUATE
Special Requests: ADEQUATE

## 2017-10-02 NOTE — Discharge Summary (Signed)
Name: Andrew Winters MRN: [id number] DOB: 1940/09/01 77 y.o. PCP: Andrew Fat, MD  Date of Admission: 09/26/2017  5:44 AM Date of Discharge: 09/28/2017 Attending Physician: Dr. Jessy Winters  Discharge Diagnosis:  Principal Problem:   Severe sepsis [facility])  Active Problems:   Diabetes mellitus type 2, controlled (HCC)   Acute encephalopathy   UTI (urinary tract infection)   Discharge Medications: Allergies as of 09/28/2017      Reactions   Aspirin Diarrhea      Medication List    TAKE these medications   acetaminophen 500 MG tablet Commonly known as:  TYLENOL Take 1,000 mg by mouth every 6 (six) hours as needed for mild pain, moderate pain or headache.   atorvastatin 40 MG tablet Commonly known as:  LIPITOR Take 40 mg by mouth at bedtime.   cefdinir 300 MG capsule Commonly known as:  OMNICEF Take 1 capsule (300 mg total) by mouth 2 (two) times daily for 4 days.   cetirizine 10 MG tablet Commonly known as:  ZYRTEC Take 10 mg by mouth daily.   cholecalciferol 1000 units tablet Commonly known as:  VITAMIN D Take 1,000 Units by mouth 2 (two) times daily.   diltiazem 60 MG 12 hr capsule Commonly known as:  CARDIZEM SR Take 60 mg by mouth 2 (two) times daily.   esomeprazole 20 MG capsule Commonly known as:  NEXIUM Take 20 mg by mouth daily before breakfast.   ferrous sulfate 325 (65 FE) MG tablet Take 325 mg by mouth daily with breakfast.   furosemide 20 MG tablet Commonly known as:  LASIX Take 1 tablet (20 mg total) by mouth every Monday, Wednesday, and Friday. What changed:  when to take this   gabapentin 100 MG capsule Commonly known as:  NEURONTIN Take 1 capsule (100 mg total) by mouth at bedtime. What changed:    how much to take  when to take this  additional instructions   glimepiride 4 MG tablet Commonly known as:  AMARYL Take 4 mg by mouth 2 (two) times daily.   guaifenesin 400 MG Tabs tablet Commonly known as:  HUMIBID E Take  400 mg by mouth every 6 (six) hours as needed (for cough and congestion).   lisinopril 10 MG tablet Commonly known as:  PRINIVIL,ZESTRIL Take 10 mg by mouth daily.   memantine 5 MG tablet Commonly known as:  NAMENDA Take 1 tablet (5 mg total) by mouth daily.   multivitamin with minerals Tabs tablet Take 1 tablet by mouth daily.   potassium chloride 10 MEQ tablet Commonly known as:  K-DUR Take 10 mEq by mouth daily.   ranitidine 150 MG tablet Commonly known as:  ZANTAC Take 150 mg by mouth at bedtime.   rivaroxaban 20 MG Tabs tablet Commonly known as:  XARELTO Take 1 tablet (20 mg total) by mouth daily with supper. Take with food starting 06/15/2016 What changed:    when to take this  additional instructions   sertraline 50 MG tablet Commonly known as:  ZOLOFT Take 50 mg by mouth daily.   traMADol 50 MG tablet Commonly known as:  ULTRAM Take 50 mg by mouth every 8 (eight) hours as needed for moderate pain.   traZODone 50 MG tablet Commonly known as:  DESYREL Take 50 mg by mouth at bedtime.   vitamin E 400 UNIT capsule Take 400 Units by mouth daily.       Disposition and follow-up:   Mr.Andrew Winters was discharged from [facility] in stable condition.  At the hospital follow up visit please address:  1.  Severe sepsis: please make sure the patient is afebrile, does not have leukocytosis, and his mental status is at baseline.   Diarrhea: please make sure the patient's diarrhea has been resolved.  2.  Labs / imaging needed at time of follow-up: none  3.  Pending labs/ test needing follow-up: none  Follow-up Appointments: Follow-up Information    Andrew Winters, Andrew Cower, MD. Schedule an appointment as soon as possible for a visit in 1 week(s).   Specialty:  Internal Medicine Contact information: [address] [place] [address] [phone]           Hospital Course by problem list:  Severe sepsis The patient presented with acute  encephalopathy, signs and symptoms consistent with sepsis, transaminitis, and elevated lactate. Source was difficult to ascertain. Patient's chest xray did not show any new effusions or consolidations. He did not have any skin or soft tissue infections that could be appeciated on exam. He also has not had any recent lines or catheters placed.  The patient's source was thought to be urinary as his wife noted some gross hematuria prior to admission. His urinalysis showed moderate amount of LE, but negative nitrites. Blood and urine culture  The patient was given IV fluids and treated with broad spectrum antibiotics (vancomycin and zosyn) for 3 days. He was treated with and discharged on cefdinir 300mg  bid for 4 days.   Diarrhea The patient presented with a few day history of diarrhea. He was prescribed a short course of antibiotics recently. C difficile and GI pathogen panel was negative.   Discharge Vitals:   BP (!) 151/71 (BP Location: Right Arm)   Pulse 63   Temp 97.9 F (36.6 C) (Oral)   Resp 20   Ht 6\' 1"  (1.854 m)   Wt 275 lb 11.2 oz (125.1 kg)   SpO2 99%   BMI 36.37 kg/m   Pertinent Labs, Studies, and Procedures:  CBC Latest Ref Rng & Units 09/28/2017 09/26/2017 09/16/2016  WBC 4.0 - 10.5 K/uL 12.9(H) 7.0 5.4  Hemoglobin 13.0 - 17.0 g/dL 11.5(L) 11.6(L) 10.4(L)  Hematocrit 39.0 - 52.0 % 36.3(L) 37.5(L) 33.8(L)  Platelets 150 - 400 K/uL 172 153 235   BMP Latest Ref Rng & Units 09/28/2017 09/27/2017 09/26/2017  Glucose 65 - 99 mg/dL 010(X) 323(F) 573(U)  BUN 6 - 20 mg/dL 9 13 15   Creatinine 0.61 - 1.24 mg/dL 2.02 5.42(H) 0.62(B)  Sodium 135 - 145 mmol/L 140 143 142  Potassium 3.5 - 5.1 mmol/L 3.7 4.0 3.1(L)  Chloride 101 - 111 mmol/L 111 109 106  CO2 22 - 32 mmol/L 20(L) 24 24  Calcium 8.9 - 10.3 mg/dL 7.6(E) 8.3(L) 9.0   Chest xray (09/26/17): 1.  Stable cardiomegaly.  2.  No acute pulmonary disease.  Discharge Instructions: Discharge Instructions    Call MD for:  difficulty  breathing, headache or visual disturbances   Complete by:  As directed    Call MD for:  extreme fatigue   Complete by:  As directed    Call MD for:  persistant dizziness or light-headedness   Complete by:  As directed    Call MD for:  persistant nausea and vomiting   Complete by:  As directed    Call MD for:  severe uncontrolled pain   Complete by:  As directed    Call MD for:  temperature >100.4   Complete by:  As directed    Diet - low sodium heart healthy   Complete by:  As directed    Increase activity slowly   Complete by:  As directed       Signed: Lorenso Courier, MD 10/02/2017, 6:10 PM   Pager: [phone]

## 2017-10-09 DIAGNOSIS — R531 Weakness: Secondary | ICD-10-CM | POA: Diagnosis not present

## 2017-10-23 DIAGNOSIS — G4733 Obstructive sleep apnea (adult) (pediatric): Secondary | ICD-10-CM | POA: Diagnosis not present

## 2017-12-02 ENCOUNTER — Other Ambulatory Visit: Payer: Self-pay

## 2017-12-02 ENCOUNTER — Encounter (HOSPITAL_COMMUNITY): Payer: Self-pay

## 2017-12-02 ENCOUNTER — Emergency Department (HOSPITAL_COMMUNITY): Payer: Medicare Other

## 2017-12-02 ENCOUNTER — Inpatient Hospital Stay (HOSPITAL_COMMUNITY)
Admission: EM | Admit: 2017-12-02 | Discharge: 2017-12-04 | DRG: 689 | Disposition: A | Discharge status: Home / Self Care | Payer: Medicare Other | Attending: Internal Medicine | Admitting: Internal Medicine

## 2017-12-02 ENCOUNTER — Observation Stay (HOSPITAL_COMMUNITY): Payer: Medicare Other

## 2017-12-02 DIAGNOSIS — F329 Major depressive disorder, single episode, unspecified: Secondary | ICD-10-CM | POA: Diagnosis present

## 2017-12-02 DIAGNOSIS — Z7984 Long term (current) use of oral hypoglycemic drugs: Secondary | ICD-10-CM | POA: Diagnosis not present

## 2017-12-02 DIAGNOSIS — R509 Fever, unspecified: Secondary | ICD-10-CM | POA: Diagnosis present

## 2017-12-02 DIAGNOSIS — G9340 Encephalopathy, unspecified: Secondary | ICD-10-CM | POA: Diagnosis not present

## 2017-12-02 DIAGNOSIS — Z86711 Personal history of pulmonary embolism: Secondary | ICD-10-CM | POA: Diagnosis not present

## 2017-12-02 DIAGNOSIS — R319 Hematuria, unspecified: Secondary | ICD-10-CM | POA: Diagnosis not present

## 2017-12-02 DIAGNOSIS — F0150 Vascular dementia without behavioral disturbance: Secondary | ICD-10-CM | POA: Diagnosis present

## 2017-12-02 DIAGNOSIS — Z6841 Body Mass Index (BMI) 40.0 and over, adult: Secondary | ICD-10-CM

## 2017-12-02 DIAGNOSIS — I10 Essential (primary) hypertension: Secondary | ICD-10-CM | POA: Diagnosis present

## 2017-12-02 DIAGNOSIS — E1142 Type 2 diabetes mellitus with diabetic polyneuropathy: Secondary | ICD-10-CM | POA: Diagnosis not present

## 2017-12-02 DIAGNOSIS — R4182 Altered mental status, unspecified: Secondary | ICD-10-CM | POA: Diagnosis not present

## 2017-12-02 DIAGNOSIS — Z87891 Personal history of nicotine dependence: Secondary | ICD-10-CM | POA: Diagnosis not present

## 2017-12-02 DIAGNOSIS — N3001 Acute cystitis with hematuria: Secondary | ICD-10-CM | POA: Diagnosis not present

## 2017-12-02 DIAGNOSIS — K219 Gastro-esophageal reflux disease without esophagitis: Secondary | ICD-10-CM | POA: Diagnosis present

## 2017-12-02 DIAGNOSIS — G9341 Metabolic encephalopathy: Secondary | ICD-10-CM | POA: Diagnosis not present

## 2017-12-02 DIAGNOSIS — E118 Type 2 diabetes mellitus with unspecified complications: Secondary | ICD-10-CM

## 2017-12-02 DIAGNOSIS — Z7901 Long term (current) use of anticoagulants: Secondary | ICD-10-CM | POA: Diagnosis not present

## 2017-12-02 DIAGNOSIS — E785 Hyperlipidemia, unspecified: Secondary | ICD-10-CM | POA: Diagnosis present

## 2017-12-02 DIAGNOSIS — E559 Vitamin D deficiency, unspecified: Secondary | ICD-10-CM | POA: Diagnosis not present

## 2017-12-02 DIAGNOSIS — N390 Urinary tract infection, site not specified: Secondary | ICD-10-CM | POA: Diagnosis not present

## 2017-12-02 DIAGNOSIS — M069 Rheumatoid arthritis, unspecified: Secondary | ICD-10-CM | POA: Diagnosis present

## 2017-12-02 DIAGNOSIS — Z96651 Presence of right artificial knee joint: Secondary | ICD-10-CM | POA: Diagnosis not present

## 2017-12-02 DIAGNOSIS — Z886 Allergy status to analgesic agent status: Secondary | ICD-10-CM | POA: Diagnosis not present

## 2017-12-02 LAB — CBC
HCT: 38.2 % — ABNORMAL LOW (ref 39.0–52.0)
HEMOGLOBIN: 12.3 g/dL — AB (ref 13.0–17.0)
MCH: 28.4 pg (ref 26.0–34.0)
MCHC: 32.2 g/dL (ref 30.0–36.0)
MCV: 88.2 fL (ref 78.0–100.0)
Platelets: 202 10*3/uL (ref 150–400)
RBC: 4.33 MIL/uL (ref 4.22–5.81)
RDW: 16.6 % — ABNORMAL HIGH (ref 11.5–15.5)
WBC: 14.3 10*3/uL — ABNORMAL HIGH (ref 4.0–10.5)

## 2017-12-02 LAB — BASIC METABOLIC PANEL
ANION GAP: 11 (ref 5–15)
BUN: 12 mg/dL (ref 6–20)
CALCIUM: 9.2 mg/dL (ref 8.9–10.3)
CO2: 26 mmol/L (ref 22–32)
Chloride: 102 mmol/L (ref 101–111)
Creatinine, Ser: 1.15 mg/dL (ref 0.61–1.24)
GFR calc Af Amer: >60 mL/min (ref >60)
GFR calc non Af Amer: 60 mL/min — ABNORMAL LOW (ref >60)
GLUCOSE: 222 mg/dL — AB (ref 65–99)
Potassium: 4 mmol/L (ref 3.5–5.1)
Sodium: 139 mmol/L (ref 135–145)

## 2017-12-02 LAB — HEPATIC FUNCTION PANEL
ALBUMIN: 3.7 g/dL (ref 3.5–5.0)
ALT: 54 U/L (ref 17–63)
AST: 64 U/L — ABNORMAL HIGH (ref 15–41)
Alkaline Phosphatase: 90 U/L (ref 38–126)
Bilirubin, Direct: 0.1 mg/dL (ref 0.1–0.5)
Indirect Bilirubin: 0.3 mg/dL (ref 0.3–0.9)
TOTAL PROTEIN: 7.6 g/dL (ref 6.5–8.1)
Total Bilirubin: 0.4 mg/dL (ref 0.3–1.2)

## 2017-12-02 LAB — URINALYSIS, ROUTINE W REFLEX MICROSCOPIC
BILIRUBIN URINE: NEGATIVE
GLUCOSE, UA: 50 mg/dL — AB
Hgb urine dipstick: NEGATIVE
Ketones, ur: NEGATIVE mg/dL
NITRITE: NEGATIVE
PH: 8 (ref 5.0–8.0)
Protein, ur: NEGATIVE mg/dL
Specific Gravity, Urine: 1.015 (ref 1.005–1.030)

## 2017-12-02 LAB — CBG MONITORING, ED: GLUCOSE-CAPILLARY: 211 mg/dL — AB (ref 65–99)

## 2017-12-02 LAB — GLUCOSE, CAPILLARY
GLUCOSE-CAPILLARY: 126 mg/dL — AB (ref 65–99)
Glucose-Capillary: 115 mg/dL — ABNORMAL HIGH (ref 65–99)

## 2017-12-02 MED ORDER — ACETAMINOPHEN 650 MG RE SUPP: 650.0000 mg | Freq: Four times a day (QID) | RECTAL | Status: DC | PRN

## 2017-12-02 MED ORDER — ATORVASTATIN CALCIUM 40 MG PO TABS
40.0000 mg | ORAL_TABLET | Freq: Every day | ORAL | Status: DC
  Administered 2017-12-02 – 2017-12-03 (×2): 40 mg via ORAL
  Filled 2017-12-02 (×2): qty 1

## 2017-12-02 MED ORDER — LISINOPRIL 10 MG PO TABS
10.0000 mg | ORAL_TABLET | Freq: Every day | ORAL | Status: DC
  Administered 2017-12-03 – 2017-12-04 (×2): 10 mg via ORAL
  Filled 2017-12-02 (×2): qty 1

## 2017-12-02 MED ORDER — DILTIAZEM HCL ER 60 MG PO CP12
60.0000 mg | ORAL_CAPSULE | Freq: Two times a day (BID) | ORAL | Status: DC
  Administered 2017-12-02 – 2017-12-04 (×4): 60 mg via ORAL
  Filled 2017-12-02 (×4): qty 1

## 2017-12-02 MED ORDER — ADULT MULTIVITAMIN W/MINERALS CH
1.0000 | ORAL_TABLET | Freq: Every day | ORAL | Status: DC
Start: 2017-12-03 — End: 2017-12-04
  Administered 2017-12-03 – 2017-12-04 (×2): 1 via ORAL
  Filled 2017-12-02 (×2): qty 1

## 2017-12-02 MED ORDER — RIVAROXABAN 20 MG PO TABS
20.0000 mg | ORAL_TABLET | Freq: Every day | ORAL | Status: DC
  Administered 2017-12-03 – 2017-12-04 (×2): 20 mg via ORAL
  Filled 2017-12-02 (×2): qty 1

## 2017-12-02 MED ORDER — MEMANTINE HCL 5 MG PO TABS
5.0000 mg | ORAL_TABLET | Freq: Every day | ORAL | Status: DC
  Administered 2017-12-03 – 2017-12-04 (×2): 5 mg via ORAL
  Filled 2017-12-02 (×2): qty 1

## 2017-12-02 MED ORDER — FERROUS SULFATE 325 (65 FE) MG PO TABS
325.0000 mg | ORAL_TABLET | Freq: Every day | ORAL | Status: DC
  Administered 2017-12-03 – 2017-12-04 (×2): 325 mg via ORAL
  Filled 2017-12-02 (×2): qty 1

## 2017-12-02 MED ORDER — ONDANSETRON HCL 4 MG/2ML IJ SOLN: 4.0000 mg | Freq: Four times a day (QID) | INTRAMUSCULAR | Status: DC | PRN

## 2017-12-02 MED ORDER — TRAZODONE HCL 50 MG PO TABS
50.0000 mg | ORAL_TABLET | Freq: Every day | ORAL | Status: DC
  Administered 2017-12-02 – 2017-12-03 (×2): 50 mg via ORAL
  Filled 2017-12-02 (×2): qty 1

## 2017-12-02 MED ORDER — GUAIFENESIN 200 MG PO TABS
400.0000 mg | ORAL_TABLET | Freq: Four times a day (QID) | ORAL | Status: DC | PRN
  Filled 2017-12-02: qty 2

## 2017-12-02 MED ORDER — INSULIN ASPART 100 UNIT/ML ~~LOC~~ SOLN
0.0000 [IU] | Freq: Three times a day (TID) | SUBCUTANEOUS | Status: DC
  Administered 2017-12-03 – 2017-12-04 (×4): 2 [IU] via SUBCUTANEOUS

## 2017-12-02 MED ORDER — TRAMADOL HCL 50 MG PO TABS
50.0000 mg | ORAL_TABLET | Freq: Three times a day (TID) | ORAL | Status: DC | PRN
  Administered 2017-12-03: 50 mg via ORAL
  Filled 2017-12-02: qty 1

## 2017-12-02 MED ORDER — CEFTRIAXONE SODIUM 1 G IJ SOLR
1.0000 g | Freq: Once | INTRAMUSCULAR | Status: AC
  Administered 2017-12-02: 1 g via INTRAVENOUS
  Filled 2017-12-02: qty 10

## 2017-12-02 MED ORDER — SODIUM CHLORIDE 0.9 % IV SOLN
1.0000 g | INTRAVENOUS | Status: DC
  Administered 2017-12-03: 1 g via INTRAVENOUS
  Filled 2017-12-02: qty 1
  Filled 2017-12-02: qty 10

## 2017-12-02 MED ORDER — SODIUM CHLORIDE 0.9 % IV SOLN
INTRAVENOUS | Status: DC
  Administered 2017-12-02 (×2): via INTRAVENOUS

## 2017-12-02 MED ORDER — VITAMIN E 180 MG (400 UNIT) PO CAPS
400.0000 [IU] | ORAL_CAPSULE | Freq: Every day | ORAL | Status: DC
  Administered 2017-12-03 – 2017-12-04 (×2): 400 [IU] via ORAL
  Filled 2017-12-02 (×2): qty 1

## 2017-12-02 MED ORDER — GABAPENTIN 100 MG PO CAPS
100.0000 mg | ORAL_CAPSULE | ORAL | Status: DC
  Administered 2017-12-03 – 2017-12-04 (×3): 100 mg via ORAL
  Filled 2017-12-02 (×2): qty 1

## 2017-12-02 MED ORDER — ACETAMINOPHEN 325 MG PO TABS
650.0000 mg | ORAL_TABLET | Freq: Four times a day (QID) | ORAL | Status: DC | PRN
  Administered 2017-12-02: 650 mg via ORAL
  Filled 2017-12-02: qty 2

## 2017-12-02 MED ORDER — GABAPENTIN 100 MG PO CAPS: 100.0000 mg | ORAL_CAPSULE | ORAL | Status: DC

## 2017-12-02 MED ORDER — ONDANSETRON HCL 4 MG PO TABS: 4.0000 mg | ORAL_TABLET | Freq: Four times a day (QID) | ORAL | Status: DC | PRN

## 2017-12-02 MED ORDER — GABAPENTIN 100 MG PO CAPS
200.0000 mg | ORAL_CAPSULE | Freq: Every day | ORAL | Status: DC
  Administered 2017-12-02 – 2017-12-03 (×2): 200 mg via ORAL
  Filled 2017-12-02 (×2): qty 2

## 2017-12-02 MED ORDER — VITAMIN D3 25 MCG (1000 UNIT) PO TABS
1000.0000 [IU] | ORAL_TABLET | Freq: Two times a day (BID) | ORAL | Status: DC
  Administered 2017-12-02 – 2017-12-04 (×4): 1000 [IU] via ORAL
  Filled 2017-12-02 (×8): qty 1

## 2017-12-02 MED ORDER — SERTRALINE HCL 50 MG PO TABS
50.0000 mg | ORAL_TABLET | Freq: Every day | ORAL | Status: DC
  Administered 2017-12-03 – 2017-12-04 (×2): 50 mg via ORAL
  Filled 2017-12-02 (×2): qty 1

## 2017-12-02 MED ORDER — ACETAMINOPHEN 500 MG PO TABS: 1000.0000 mg | ORAL_TABLET | Freq: Every day | ORAL | Status: DC | PRN

## 2017-12-02 NOTE — H&P (Signed)
History and Physical    Jamiyl Flight BTD:[id number] DOB: 02-11-1941 DOA: 12/02/2017  PCP: Crist Fat, MD  Patient coming from: Home  Chief Complaint: Confusion  HPI: Andrew Winters is a 77 y.o. male with medical history significant of dementia, pulmonary emboli, diabetes, hypertension, hyperlipidemia brought in by wife due to worsening confusion than usual and noticing some blood in his urine today.  Patient denies any pain but he probably is unreliable due to his dementia.  History is all obtained for the wife.  No nausea vomiting or diarrhea.   Review of Systems: As per HPI otherwise 10 point review of systems negative per wife  Past Medical History:  Diagnosis Date  . Acute saddle pulmonary embolism (HCC) 05/20/2016  . Anxiety   . Bilateral swelling of feet   . BPH (benign prostatic hyperplasia)    no meds needed per medical md  . Cataracts, bilateral    immature  . Chronic diarrhea   . Deafness    in left ear   . Depression    takes Zoloft daily  . Diabetes mellitus    takes Actos and Amaryl daily as well as Onglyza  . Dysphagia   . Fatigue   . GERD (gastroesophageal reflux disease)    takes Omeprazole daily  . Headaches, cluster   . Hiatal hernia   . Hyperlipemia    was on meds but hasnt been on anything in about 51months per MD  . Hypertension    takes Amlodipine,HCTZ,and Lisinopril daily  . Hyperthyroidism   . Insomnia    takes Trazodone nightly  . Nocturia   . Osteoarthritis   . Peripheral neuropathy    takes Gabapentin daily  . Pneumonia 3+ yrs ago  . Presbyesophagus   . Rheumatoid arthritis(714.0)   . Vitamin D deficiency    takes Vit D daily    Past Surgical History:  Procedure Laterality Date  . BACK SURGERY  1998  . BOTOX INJECTION N/A 11/11/2016   Procedure: BOTOX INJECTION;  Surgeon: Sherrilyn Rist, MD;  Location: [facility];  Service: Gastroenterology;  Laterality: N/A;  . CATARACT EXTRACTION, BILATERAL  2018  . COLONOSCOPY WITH  ESOPHAGOGASTRODUODENOSCOPY (EGD) AND ESOPHAGEAL DILATION (ED)    . ESOPHAGEAL MANOMETRY N/A 09/14/2016   Procedure: ESOPHAGEAL MANOMETRY (EM);  Surgeon: Ruffin Frederick, MD;  Location: [facility];  Service: Gastroenterology;  Laterality: N/A;  . ESOPHAGOGASTRODUODENOSCOPY N/A 11/11/2016   Procedure: ESOPHAGOGASTRODUODENOSCOPY (EGD);  Surgeon: Sherrilyn Rist, MD;  Location: [facility];  Service: Gastroenterology;  Laterality: N/A;  . ESOPHAGOGASTRODUODENOSCOPY (EGD) WITH PROPOFOL N/A 09/15/2016   Procedure: ESOPHAGOGASTRODUODENOSCOPY (EGD) WITH PROPOFOL;  Surgeon: Ruffin Frederick, MD;  Location: [facility];  Service: Gastroenterology;  Laterality: N/A;  . IRRIGATION AND DEBRIDEMENT KNEE Right 01/12/2015  . TOTAL KNEE ARTHROPLASTY Right 01/06/2015   Procedure: TOTAL KNEE ARTHROPLASTY;  Surgeon: Marcene Corning, MD;  Location: [facility];  Service: Orthopedics;  Laterality: Right;     reports that he quit smoking about 3 years ago. His smoking use included cigarettes. He has a 35.00 pack-year smoking history. He has never used smokeless tobacco. He reports that he does not drink alcohol or use drugs.  Allergies  Allergen Reactions  . Aspirin Diarrhea    Family History  Problem Relation Age of Onset  . Heart disease Mother   . Diabetes Mother   . Heart attack Father   . Diabetes Brother   . Colon cancer Neg Hx   . Stomach cancer Neg Hx   .  Esophageal cancer Neg Hx   . Rectal cancer Neg Hx   . Liver cancer Neg Hx     Prior to Admission medications   Medication Sig Start Date End Date Taking? Authorizing Provider  acetaminophen (TYLENOL) 500 MG tablet Take 1,000 mg by mouth daily as needed for mild pain, moderate pain or headache.    Yes [provider]  atorvastatin (LIPITOR) 40 MG tablet Take 40 mg by mouth at bedtime.    Yes [provider]  cetirizine (ZYRTEC) 10 MG tablet Take 10 mg by mouth daily.   Yes [provider]  cholecalciferol  (VITAMIN D) 1000 units tablet Take 1,000 Units by mouth 2 (two) times daily.   Yes [provider]  diltiazem (CARDIZEM SR) 60 MG 12 hr capsule Take 60 mg by mouth 2 (two) times daily. 10/24/16  Yes [provider]  ferrous sulfate 325 (65 FE) MG tablet Take 325 mg by mouth daily with breakfast.   Yes [provider]  furosemide (LASIX) 20 MG tablet Take 1 tablet (20 mg total) by mouth every Monday, Wednesday, and Friday. Patient taking differently: Take 20 mg by mouth daily.  09/19/16  Yes Albertine Grates, MD  gabapentin (NEURONTIN) 100 MG capsule Take 1 capsule (100 mg total) by mouth at bedtime. Patient taking differently: Take 100-200 mg by mouth See admin instructions. 100mg  in the morning and at noon and 200mg  at bedtime 09/17/16  Yes Albertine Grates, MD  glimepiride (AMARYL) 4 MG tablet Take 4 mg by mouth 2 (two) times daily.    Yes [provider]  guaifenesin (HUMIBID E) 400 MG TABS tablet Take 400 mg by mouth every 6 (six) hours as needed (for cough and congestion).   Yes [provider]  lisinopril (PRINIVIL,ZESTRIL) 10 MG tablet Take 10 mg by mouth daily.    Yes [provider]  memantine (NAMENDA) 5 MG tablet Take 1 tablet (5 mg total) by mouth daily. 09/18/16  Yes Albertine Grates, MD  Multiple Vitamin (MULTIVITAMIN WITH MINERALS) TABS tablet Take 1 tablet by mouth daily.   Yes [provider]  omeprazole (PRILOSEC) 20 MG capsule Take 40 mg by mouth daily.   Yes [provider]  potassium chloride (K-DUR) 10 MEQ tablet Take 10 mEq by mouth daily. 10/22/16  Yes [provider]  ranitidine (ZANTAC) 150 MG tablet Take 150 mg by mouth at bedtime.   Yes [provider]  rivaroxaban (XARELTO) 20 MG TABS tablet Take 1 tablet (20 mg total) by mouth daily with supper. Take with food starting 06/15/2016 Patient taking differently: Take 20 mg by mouth daily. Take with food starting 06/15/2016 06/15/16  Yes Mayo, Allyn Kenner, MD  sertraline  (ZOLOFT) 50 MG tablet Take 50 mg by mouth daily. 10/21/16  Yes [provider]  traMADol (ULTRAM) 50 MG tablet Take 50 mg by mouth every 8 (eight) hours as needed for moderate pain.   Yes [provider]  traZODone (DESYREL) 50 MG tablet Take 50 mg by mouth at bedtime.   Yes [provider]  vitamin E 400 UNIT capsule Take 400 Units by mouth daily.   Yes [provider]    Physical Exam: Vitals:   12/02/17 1300 12/02/17 1330 12/02/17 1400 12/02/17 1453  BP: 128/67 122/67 135/65 128/73  Pulse: 78 77 72 68  Resp: (!) 21 (!) 21 (!) 23 (!) 22  Temp:      TempSrc:      SpO2: 95% 96% 98% 96%  Weight:      Height:          Constitutional: NAD, calm, comfortable pleasantly mildly confused Vitals:   12/02/17 1300 12/02/17 1330 12/02/17 1400 12/02/17 1453  BP: 128/67 122/67 135/65 128/73  Pulse: 78 77 72 68  Resp: (!) 21 (!) 21 (!) 23 (!) 22  Temp:      TempSrc:      SpO2: 95% 96% 98% 96%  Weight:      Height:       Eyes: PERRL, lids and conjunctivae normal ENMT: Mucous membranes are moist. Posterior pharynx clear of any exudate or lesions.Normal dentition.  Neck: normal, supple, no masses, no thyromegaly Respiratory: clear to auscultation bilaterally, no wheezing, no crackles. Normal respiratory effort. No accessory muscle use.  Cardiovascular: Regular rate and rhythm, no murmurs / rubs / gallops. No extremity edema. 2+ pedal pulses. No carotid bruits.  Abdomen: no tenderness, no masses palpated. No hepatosplenomegaly. Bowel sounds positive.  Musculoskeletal: no clubbing / cyanosis. No joint deformity upper and lower extremities. Good ROM, no contractures. Normal muscle tone.  Skin: no rashes, lesions, ulcers. No induration Neurologic: CN 2-12 grossly intact. Sensation intact, DTR normal. Strength 5/5 in all 4.  Psychiatric: Normal judgment and insight. Alert and oriented x 3. Normal mood.    Labs on Admission: I have personally reviewed  following labs and imaging studies  CBC: Recent Labs  Lab 12/02/17 1225  WBC 14.3*  HGB 12.3*  HCT 38.2*  MCV 88.2  PLT 202   Basic Metabolic Panel: Recent Labs  Lab 12/02/17 1225  NA 139  K 4.0  CL 102  CO2 26  GLUCOSE 222*  BUN 12  CREATININE 1.15  CALCIUM 9.2   GFR: Estimated Creatinine Clearance: 71.5 mL/min (by C-G formula based on SCr of 1.15 mg/dL). Liver Function Tests: Recent Labs  Lab 12/02/17 1225  AST 64*  ALT 54  ALKPHOS 90  BILITOT 0.4  PROT 7.6  ALBUMIN 3.7   No results for input(s): LIPASE, AMYLASE in the last 168 hours. No results for input(s): AMMONIA in the last 168 hours. Coagulation Profile: No results for input(s): INR, PROTIME in the last 168 hours. Cardiac Enzymes: No results for input(s): CKTOTAL, CKMB, CKMBINDEX, TROPONINI in the last 168 hours. BNP (last 3 results) No results for input(s): PROBNP in the last 8760 hours. HbA1C: No results for input(s): HGBA1C in the last 72 hours. CBG: Recent Labs  Lab 12/02/17 1159  GLUCAP 211*   Lipid Profile: No results for input(s): CHOL, HDL, LDLCALC, TRIG, CHOLHDL, LDLDIRECT in the last 72 hours. Thyroid Function Tests: No results for input(s): TSH, T4TOTAL, FREET4, T3FREE, THYROIDAB in the last 72 hours. Anemia Panel: No results for input(s): VITAMINB12, FOLATE, FERRITIN, TIBC, IRON, RETICCTPCT in the last 72 hours. Urine analysis:    Component Value Date/Time   COLORURINE YELLOW 12/02/2017 1150   APPEARANCEUR CLEAR 12/02/2017 1150   LABSPEC 1.015 12/02/2017 1150   PHURINE 8.0 12/02/2017 1150   GLUCOSEU 50 (A) 12/02/2017 1150   HGBUR NEGATIVE 12/02/2017 1150   BILIRUBINUR NEGATIVE 12/02/2017 1150   BILIRUBINUR negative 10/08/2015 1925   KETONESUR NEGATIVE 12/02/2017 1150   PROTEINUR NEGATIVE 12/02/2017 1150   UROBILINOGEN 1.0 10/08/2015 1925   UROBILINOGEN 0.2 01/07/2015 2223   NITRITE NEGATIVE 12/02/2017 1150   LEUKOCYTESUR TRACE (A) 12/02/2017 1150   Sepsis Labs:  !!!!!!!!!!!!!!!!!!!!!!!!!!!!!!!!!!!!!!!!!!!! @LABRCNTIP (procalcitonin:4,lacticidven:4) )No results found for this or any previous visit (from the past 240 hour(s)).   Radiological Exams on Admission: Dg Chest  2 View  Result Date: 12/02/2017 CLINICAL DATA:  Altered mental status, fever. EXAM: CHEST - 2 VIEW COMPARISON:  Radiograph of September 26, 2017. FINDINGS: Stable cardiomediastinal silhouette. No pneumothorax or pleural effusion is noted. Both lungs are clear. The visualized skeletal structures are unremarkable. IMPRESSION: No active cardiopulmonary disease. Electronically Signed   By: Lupita Raider, M.D.   On: 12/02/2017 12:34    Old chart reviewed Case discussed with EDP Dr. Rubin Payor Chest x-ray reviewed no edema or infiltrate  Assessment/Plan 77 year old demented male comes in with confusion from likely UTI/fever Principal Problem:   UTI (urinary tract infection)-IV Rocephin.  Obtain urine and blood cultures and follow-up 1 day apart.  Lactic acid level normal.  Active Problems:   Fever-due to the above.  Chest x-ray negative   GERD-noted   Morbid obesity (HCC)-noted   Vascular dementia without behavioral disturbance-noted   DVT prophylaxis: Eliquis Code Status: Full Family Communication: Wife Disposition Plan: Per day team Consults called: None Admission status: Observation   Kyro Joswick A MD [facility]  If 7PM-7AM, please contact night-coverage [email or web address] Password [facility]  12/02/2017, 3:26 PM

## 2017-12-02 NOTE — ED Triage Notes (Signed)
His wife states pt. Awoke with some confusion; and she also noted p[t. To have some hematuria. She states he was "normal" yesterday. He is in no distress. He tells me his age is "14"; and he is able to tell me it is the month of "May".

## 2017-12-02 NOTE — ED Provider Notes (Signed)
[facility]-EMERGENCY DEPT Provider Note   CSN: [id number] Arrival date & time: 12/02/17  1111     History   Chief Complaint Chief Complaint  Patient presents with  . Altered Mental Status  . Hematuria    HPI Andrew Winters is a 77 y.o. male. Level 5 caveat due to altered mental status. HPI Patient brought in for altered mental status.  Confused today.  Reported fever of 101.4 at home.  Also had some blood in his pull-ups.  Some confusion according to his wife.  Patient wants his wife to provide much of the history.  States that he does feel bad. Past Medical History:  Diagnosis Date  . Acute saddle pulmonary embolism (HCC) 05/20/2016  . Anxiety   . Bilateral swelling of feet   . BPH (benign prostatic hyperplasia)    no meds needed per medical md  . Cataracts, bilateral    immature  . Chronic diarrhea   . Deafness    in left ear   . Depression    takes Zoloft daily  . Diabetes mellitus    takes Actos and Amaryl daily as well as Onglyza  . Dysphagia   . Fatigue   . GERD (gastroesophageal reflux disease)    takes Omeprazole daily  . Headaches, cluster   . Hiatal hernia   . Hyperlipemia    was on meds but hasnt been on anything in about 29months per MD  . Hypertension    takes Amlodipine,HCTZ,and Lisinopril daily  . Hyperthyroidism   . Insomnia    takes Trazodone nightly  . Nocturia   . Osteoarthritis   . Peripheral neuropathy    takes Gabapentin daily  . Pneumonia 3+ yrs ago  . Presbyesophagus   . Rheumatoid arthritis(714.0)   . Vitamin D deficiency    takes Vit D daily    Patient Active Problem List   Diagnosis Date Noted  . Severe sepsis (HCC) 09/26/2017  . UTI (urinary tract infection) 09/26/2017  . Abnormal loss of weight   . History of snoring 09/20/2016  . Chronic diarrhea 09/20/2016  . Urinary tract infection without hematuria   . Vascular dementia without behavioral disturbance   . Hypokalemia   . Dysphagia   . Chest  pain 09/11/2016  . Pulmonary emboli (HCC) 05/20/2016  . Acute saddle pulmonary embolism without acute cor pulmonale (HCC) 05/20/2016  . Pseudogout of right knee 01/13/2015  . DVT of upper extremity (deep vein thrombosis): LEFT 01/11/2015  . Anemia, iron deficiency 01/11/2015  . Postoperative fever   . Fever 01/08/2015  . Diabetes mellitus type 2, controlled (HCC) 01/07/2015  . Essential hypertension 01/07/2015  . SIRS (systemic inflammatory response syndrome) (HCC) 01/07/2015  . Acute encephalopathy   . Primary osteoarthritis of right knee 01/06/2015  . Diabetes (HCC) 01/06/2015  . Morbid obesity (HCC) 01/06/2015  . DIABETIC PERIPHERAL NEUROPATHY 01/05/2010  . HEADACHE 05/07/2009  . UNSPECIFIED VITAMIN D DEFICIENCY 02/03/2009  . LIVER FUNCTION TESTS, ABNORMAL 07/19/2007  . HYPERTHYROIDISM 01/04/2007  . DIABETES MELLITUS, TYPE II 01/04/2007  . HYPERLIPIDEMIA 01/04/2007  . ERECTILE DYSFUNCTION 01/04/2007  . DEPRESSION 01/04/2007  . GERD 01/04/2007  . BENIGN PROSTATIC HYPERTROPHY 01/04/2007  . OSTEOARTHRITIS 01/04/2007    Past Surgical History:  Procedure Laterality Date  . BACK SURGERY  1998  . BOTOX INJECTION N/A 11/11/2016   Procedure: BOTOX INJECTION;  Surgeon: Sherrilyn Rist, MD;  Location: [facility];  Service: Gastroenterology;  Laterality: N/A;  . CATARACT EXTRACTION, BILATERAL  2018  .  COLONOSCOPY WITH ESOPHAGOGASTRODUODENOSCOPY (EGD) AND ESOPHAGEAL DILATION (ED)    . ESOPHAGEAL MANOMETRY N/A 09/14/2016   Procedure: ESOPHAGEAL MANOMETRY (EM);  Surgeon: Ruffin Frederick, MD;  Location: [facility];  Service: Gastroenterology;  Laterality: N/A;  . ESOPHAGOGASTRODUODENOSCOPY N/A 11/11/2016   Procedure: ESOPHAGOGASTRODUODENOSCOPY (EGD);  Surgeon: Sherrilyn Rist, MD;  Location: [facility];  Service: Gastroenterology;  Laterality: N/A;  . ESOPHAGOGASTRODUODENOSCOPY (EGD) WITH PROPOFOL N/A 09/15/2016   Procedure: ESOPHAGOGASTRODUODENOSCOPY (EGD) WITH PROPOFOL;   Surgeon: Ruffin Frederick, MD;  Location: [facility];  Service: Gastroenterology;  Laterality: N/A;  . IRRIGATION AND DEBRIDEMENT KNEE Right 01/12/2015  . TOTAL KNEE ARTHROPLASTY Right 01/06/2015   Procedure: TOTAL KNEE ARTHROPLASTY;  Surgeon: Marcene Corning, MD;  Location: [facility];  Service: Orthopedics;  Laterality: Right;        Home Medications    Prior to Admission medications   Medication Sig Start Date End Date Taking? Authorizing Provider  acetaminophen (TYLENOL) 500 MG tablet Take 1,000 mg by mouth daily as needed for mild pain, moderate pain or headache.    Yes [provider]  atorvastatin (LIPITOR) 40 MG tablet Take 40 mg by mouth at bedtime.    Yes [provider]  cetirizine (ZYRTEC) 10 MG tablet Take 10 mg by mouth daily.   Yes [provider]  cholecalciferol (VITAMIN D) 1000 units tablet Take 1,000 Units by mouth 2 (two) times daily.   Yes [provider]  diltiazem (CARDIZEM SR) 60 MG 12 hr capsule Take 60 mg by mouth 2 (two) times daily. 10/24/16  Yes [provider]  ferrous sulfate 325 (65 FE) MG tablet Take 325 mg by mouth daily with breakfast.   Yes [provider]  furosemide (LASIX) 20 MG tablet Take 1 tablet (20 mg total) by mouth every Monday, Wednesday, and Friday. Patient taking differently: Take 20 mg by mouth daily.  09/19/16  Yes Albertine Grates, MD  gabapentin (NEURONTIN) 100 MG capsule Take 1 capsule (100 mg total) by mouth at bedtime. Patient taking differently: Take 100-200 mg by mouth See admin instructions. 100mg  in the morning and at noon and 200mg  at bedtime 09/17/16  Yes , MD  glimepiride (AMARYL) 4 MG tablet Take 4 mg by mouth 2 (two) times daily.    Yes [provider]  guaifenesin (HUMIBID E) 400 MG TABS tablet Take 400 mg by mouth every 6 (six) hours as needed (for cough and congestion).   Yes [provider]  lisinopril (PRINIVIL,ZESTRIL) 10 MG tablet Take 10 mg by mouth  daily.    Yes [provider]  memantine (NAMENDA) 5 MG tablet Take 1 tablet (5 mg total) by mouth daily. 09/18/16  Yes Albertine Grates, MD  Multiple Vitamin (MULTIVITAMIN WITH MINERALS) TABS tablet Take 1 tablet by mouth daily.   Yes [provider]  omeprazole (PRILOSEC) 20 MG capsule Take 40 mg by mouth daily.   Yes [provider]  potassium chloride (K-DUR) 10 MEQ tablet Take 10 mEq by mouth daily. 10/22/16  Yes [provider]  ranitidine (ZANTAC) 150 MG tablet Take 150 mg by mouth at bedtime.   Yes [provider]  rivaroxaban (XARELTO) 20 MG TABS tablet Take 1 tablet (20 mg total) by mouth daily with supper. Take with food starting 06/15/2016 Patient taking differently: Take 20 mg by mouth daily. Take with food starting 06/15/2016 06/15/16  Yes Mayo, 14/12/2015, MD  sertraline (ZOLOFT) 50 MG tablet Take 50 mg by mouth daily. 10/21/16  Yes  [provider]  traMADol (ULTRAM) 50 MG tablet Take 50 mg by mouth every 8 (eight) hours as needed for moderate pain.   Yes [provider]  traZODone (DESYREL) 50 MG tablet Take 50 mg by mouth at bedtime.   Yes [provider]  vitamin E 400 UNIT capsule Take 400 Units by mouth daily.   Yes [provider]    Family History Family History  Problem Relation Age of Onset  . Heart disease Mother   . Diabetes Mother   . Heart attack Father   . Diabetes Brother   . Colon cancer Neg Hx   . Stomach cancer Neg Hx   . Esophageal cancer Neg Hx   . Rectal cancer Neg Hx   . Liver cancer Neg Hx     Social History Social History   Tobacco Use  . Smoking status: Former Smoker    Packs/day: 1.00    Years: 35.00    Pack years: 35.00    Types: Cigarettes    Last attempt to quit: 10/15/2014    Years since quitting: 3.1  . Smokeless tobacco: Never Used  . Tobacco comment: pack last about 2-3wks  Substance Use Topics  . Alcohol use: No  . Drug use: No     Allergies    Aspirin   Review of Systems Review of Systems  Unable to perform ROS: Mental status change     Physical Exam Updated Vital Signs BP 135/65   Pulse 72   Temp (!) 100.9 F (38.3 C) (Rectal)   Resp (!) 23   Ht 5\' 9"  (1.753 m)   Wt 125.1 kg (275 lb 12.7 oz)   SpO2 98%   BMI 40.73 kg/m   Physical Exam  Constitutional: He appears well-developed.  HENT:  Head: Normocephalic.  Eyes: Pupils are equal, round, and reactive to light.  Neck: Neck supple.  Cardiovascular: Normal rate.  Pulmonary/Chest: Effort normal.  Abdominal: He exhibits distension. There is no tenderness.  Musculoskeletal: He exhibits edema.  Neurological: He is alert.  Awake and pleasant but some confusion.  Moving all extremities.  Skin: Skin is warm. Capillary refill takes less than 2 seconds.     ED Treatments / Results  Labs (all labs ordered are listed, but only abnormal results are displayed) Labs Reviewed  URINALYSIS, ROUTINE W REFLEX MICROSCOPIC - Abnormal; Notable for the following components:      Result Value   Glucose, UA 50 (*)    Leukocytes, UA TRACE (*)    Bacteria, UA MANY (*)    All other components within normal limits  BASIC METABOLIC PANEL - Abnormal; Notable for the following components:   Glucose, Bld 222 (*)    GFR calc non Af Amer 60 (*)    All other components within normal limits  CBC - Abnormal; Notable for the following components:   WBC 14.3 (*)    Hemoglobin 12.3 (*)    HCT 38.2 (*)    RDW 16.6 (*)    All other components within normal limits  HEPATIC FUNCTION PANEL - Abnormal; Notable for the following components:   AST 64 (*)    All other components within normal limits  CBG MONITORING, ED - Abnormal; Notable for the following components:   Glucose-Capillary 211 (*)    All other components within normal limits  URINE CULTURE    EKG None  Radiology Dg Chest 2 View  Result Date: 12/02/2017 CLINICAL DATA:  Altered mental status, fever. EXAM: CHEST -  2 VIEW  COMPARISON:  Radiograph of September 26, 2017. FINDINGS: Stable cardiomediastinal silhouette. No pneumothorax or pleural effusion is noted. Both lungs are clear. The visualized skeletal structures are unremarkable. IMPRESSION: No active cardiopulmonary disease. Electronically Signed   By: Lupita Raider, M.D.   On: 12/02/2017 12:34    Procedures Procedures (including critical care time)  Medications Ordered in ED Medications  cefTRIAXone (ROCEPHIN) 1 g in sodium chloride 0.9 % 100 mL IVPB (1 g Intravenous New Bag/Given 12/02/17 1432)     Initial Impression / Assessment and Plan / ED Course  I have reviewed the triage vital signs and the nursing notes.  Pertinent labs & imaging results that were available during my care of the patient were reviewed by me and considered in my medical decision making (see chart for details).     Patient with fever mental status changes.  Temperature 100.9 rectally.  Has urinary tract infection.  Likely cause of his encephalopathy.  Otherwise labs are overall reassuring.  With the mental status changes will admit to hospitalist.  Final Clinical Impressions(s) / ED Diagnoses   Final diagnoses:  Encephalopathy  Urinary tract infection without hematuria, site unspecified    ED Discharge Orders    None       Benjiman Core, MD 12/02/17 1438

## 2017-12-02 NOTE — ED Notes (Signed)
ED TO INPATIENT HANDOFF REPORT  Name/Age/Gender Andrew Winters 77 y.o. male  Code Status    Code Status Orders  (From admission, onward)        Start     Ordered   12/02/17 1529  Full code  Continuous     12/02/17 1529    Code Status History    Date Active Date Inactive Code Status Order ID Comments User Context   09/26/2017 1447 09/28/2017 2236 Full Code [id number]  Shela Leff, MD Inpatient   09/11/2016 1717 09/17/2016 2119 Full Code [id number]  Benito Mccreedy, MD Inpatient   09/11/2016 1717 09/11/2016 1717 Full Code [id number]  Benito Mccreedy, MD Inpatient   05/20/2016 2027 05/25/2016 0009 Full Code [id number]  Carlyle Dolly, MD Inpatient   01/06/2015 1606 01/14/2015 2110 Full Code [id number]  Macie Burows Inpatient      Home/SNF/Other Home  Chief Complaint fever/confusion/blood in urine  Level of Care/Admitting Diagnosis ED Disposition    ED Disposition Condition [facility] Area: [facility] [[id number]]  Level of Care: Med-Surg [16]  Diagnosis: UTI (urinary tract infection) [696789]  Admitting Physician: Phillips Grout [[id number]]  Attending Physician: Derrill Kay A [[id number]]  PT Class (Do Not Modify): Observation [104]  PT Acc Code (Do Not Modify): Observation [10022]       Medical History Past Medical History:  Diagnosis Date  . Acute saddle pulmonary embolism ([place]) 05/20/2016  . Anxiety   . Bilateral swelling of feet   . BPH (benign prostatic hyperplasia)    no meds needed per medical md  . Cataracts, bilateral    immature  . Chronic diarrhea   . Deafness    in left ear   . Depression    takes Zoloft daily  . Diabetes mellitus    takes Actos and Amaryl daily as well as Onglyza  . Dysphagia   . Fatigue   . GERD (gastroesophageal reflux disease)    takes Omeprazole daily  . Headaches, cluster   . Hiatal hernia   . Hyperlipemia    was on meds but hasnt been on anything in about 62month per MD  .  Hypertension    takes Amlodipine,HCTZ,and Lisinopril daily  . Hyperthyroidism   . Insomnia    takes Trazodone nightly  . Nocturia   . Osteoarthritis   . Peripheral neuropathy    takes Gabapentin daily  . Pneumonia 3+ yrs ago  . Presbyesophagus   . Rheumatoid arthritis(714.0)   . Vitamin D deficiency    takes Vit D daily    Allergies Allergies  Allergen Reactions  . Aspirin Diarrhea    IV Location/Drains/Wounds Patient Lines/Drains/Airways Status   Active Line/Drains/Airways    Name:   Placement date:   Placement time:   Site:   Days:   Peripheral IV 12/02/17 Right Antecubital   12/02/17    1220    Antecubital   less than 1          Labs/Imaging Results for orders placed or performed during the hospital encounter of 12/02/17 (from the past 48 hour(s))  Urinalysis, Routine w reflex microscopic- may I&O cath if menses     Status: Abnormal   Collection Time: 12/02/17 11:50 AM  Result Value Ref Range   Color, Urine YELLOW YELLOW   APPearance CLEAR CLEAR   Specific Gravity, Urine 1.015 1.005 - 1.030   pH 8.0 5.0 - 8.0   Glucose, UA 50 (A) NEGATIVE mg/dL   Hgb urine  dipstick NEGATIVE NEGATIVE   Bilirubin Urine NEGATIVE NEGATIVE   Ketones, ur NEGATIVE NEGATIVE mg/dL   Protein, ur NEGATIVE NEGATIVE mg/dL   Nitrite NEGATIVE NEGATIVE   Leukocytes, UA TRACE (A) NEGATIVE   RBC / HPF 0-5 0 - 5 RBC/hpf   WBC, UA 11-20 0 - 5 WBC/hpf   Bacteria, UA MANY (A) NONE SEEN   Mucus PRESENT     Comment: Performed at [facility], [place] [address], [place], [place] [address]  CBG monitoring, ED     Status: Abnormal   Collection Time: 12/02/17 11:59 AM  Result Value Ref Range   Glucose-Capillary 211 (H) 65 - 99 mg/dL  Basic metabolic panel     Status: Abnormal   Collection Time: 12/02/17 12:25 PM  Result Value Ref Range   Sodium 139 135 - 145 mmol/L   Potassium 4.0 3.5 - 5.1 mmol/L   Chloride 102 101 - 111 mmol/L   CO2 26 22 - 32 mmol/L   Glucose, Bld 222 (H) 65  - 99 mg/dL   BUN 12 6 - 20 mg/dL   Creatinine, Ser 1.15 0.61 - 1.24 mg/dL   Calcium 9.2 8.9 - 10.3 mg/dL   GFR calc non Af Amer 60 (L) >60 mL/min   GFR calc Af Amer >60 >60 mL/min    Comment: (NOTE) The eGFR has been calculated using the CKD EPI equation. This calculation has not been validated in all clinical situations. eGFR's persistently <60 mL/min signify possible Chronic Kidney Disease.    Anion gap 11 5 - 15    Comment: Performed at [facility], [place] [address] [place], [place] [address]  CBC     Status: Abnormal   Collection Time: 12/02/17 12:25 PM  Result Value Ref Range   WBC 14.3 (H) 4.0 - 10.5 K/uL   RBC 4.33 4.22 - 5.81 MIL/uL   Hemoglobin 12.3 (L) 13.0 - 17.0 g/dL   HCT 38.2 (L) 39.0 - 52.0 %   MCV 88.2 78.0 - 100.0 fL   MCH 28.4 26.0 - 34.0 pg   MCHC 32.2 30.0 - 36.0 g/dL   RDW 16.6 (H) 11.5 - 15.5 %   Platelets 202 150 - 400 K/uL    Comment: Performed at [facility], [place] [address] [place], [place] [address]  Hepatic function panel     Status: Abnormal   Collection Time: 12/02/17 12:25 PM  Result Value Ref Range   Total Protein 7.6 6.5 - 8.1 g/dL   Albumin 3.7 3.5 - 5.0 g/dL   AST 64 (H) 15 - 41 U/L   ALT 54 17 - 63 U/L   Alkaline Phosphatase 90 38 - 126 U/L   Total Bilirubin 0.4 0.3 - 1.2 mg/dL   Bilirubin, Direct 0.1 0.1 - 0.5 mg/dL   Indirect Bilirubin 0.3 0.3 - 0.9 mg/dL    Comment: Performed at [facility], [place] [address] [place], [place] [address]   Dg Chest 2 View  Result Date: 12/02/2017 CLINICAL DATA:  Altered mental status, fever. EXAM: CHEST - 2 VIEW COMPARISON:  Radiograph of September 26, 2017. FINDINGS: Stable cardiomediastinal silhouette. No pneumothorax or pleural effusion is noted. Both lungs are clear. The visualized skeletal structures are unremarkable. IMPRESSION: No active cardiopulmonary disease. Electronically Signed   By: Marijo Conception, M.D.   On: 12/02/2017 12:34     Pending Labs Unresulted Labs (From admission, onward)   Start     Ordered   12/02/17 1136  Urine C&S  Once,   STAT     12/02/17 1136      Vitals/Pain Today's Vitals   12/02/17 1430 12/02/17 1453 12/02/17 1500 12/02/17 1530  BP: 127/71 128/73 129/69 131/66  Pulse: 70 68 67 65  Resp: (!) 23 (!) 22 (!) 22 (!) 21  Temp:      TempSrc:      SpO2: 96% 96% 99% 96%  Weight:      Height:      PainSc:        Isolation Precautions No active isolations  Medications Medications  traZODone (DESYREL) tablet 50 mg (has no administration in time range)  acetaminophen (TYLENOL) tablet 1,000 mg (has no administration in time range)  atorvastatin (LIPITOR) tablet 40 mg (has no administration in time range)  lisinopril (PRINIVIL,ZESTRIL) tablet 10 mg (has no administration in time range)  cholecalciferol (VITAMIN D) tablet 1,000 Units (has no administration in time range)  ferrous sulfate tablet 325 mg (has no administration in time range)  traMADol (ULTRAM) tablet 50 mg (has no administration in time range)  vitamin E capsule 400 Units (has no administration in time range)  rivaroxaban (XARELTO) tablet 20 mg (has no administration in time range)  guaifenesin (HUMIBID E) tablet 400 mg (has no administration in time range)  gabapentin (NEURONTIN) capsule 100-200 mg (has no administration in time range)  memantine (NAMENDA) tablet 5 mg (has no administration in time range)  diltiazem (CARDIZEM SR) 12 hr capsule 60 mg (has no administration in time range)  sertraline (ZOLOFT) tablet 50 mg (has no administration in time range)  multivitamin with minerals tablet 1 tablet (has no administration in time range)  0.9 %  sodium chloride infusion (has no administration in time range)  acetaminophen (TYLENOL) tablet 650 mg (has no administration in time range)    Or  acetaminophen (TYLENOL) suppository 650 mg (has no administration in time range)  ondansetron (ZOFRAN) tablet 4 mg (has no  administration in time range)    Or  ondansetron (ZOFRAN) injection 4 mg (has no administration in time range)  insulin aspart (novoLOG) injection 0-9 Units (has no administration in time range)  cefTRIAXone (ROCEPHIN) 1 g in sodium chloride 0.9 % 100 mL IVPB (has no administration in time range)  cefTRIAXone (ROCEPHIN) 1 g in sodium chloride 0.9 % 100 mL IVPB (0 g Intravenous Stopped 12/02/17 1532)    Mobility Non ambulatory

## 2017-12-03 DIAGNOSIS — K219 Gastro-esophageal reflux disease without esophagitis: Secondary | ICD-10-CM | POA: Diagnosis present

## 2017-12-03 DIAGNOSIS — E785 Hyperlipidemia, unspecified: Secondary | ICD-10-CM | POA: Diagnosis present

## 2017-12-03 DIAGNOSIS — Z86711 Personal history of pulmonary embolism: Secondary | ICD-10-CM | POA: Diagnosis not present

## 2017-12-03 DIAGNOSIS — F0150 Vascular dementia without behavioral disturbance: Secondary | ICD-10-CM | POA: Diagnosis not present

## 2017-12-03 DIAGNOSIS — N390 Urinary tract infection, site not specified: Secondary | ICD-10-CM | POA: Diagnosis present

## 2017-12-03 DIAGNOSIS — R509 Fever, unspecified: Secondary | ICD-10-CM | POA: Diagnosis not present

## 2017-12-03 DIAGNOSIS — Z886 Allergy status to analgesic agent status: Secondary | ICD-10-CM | POA: Diagnosis not present

## 2017-12-03 DIAGNOSIS — N3001 Acute cystitis with hematuria: Secondary | ICD-10-CM | POA: Diagnosis not present

## 2017-12-03 DIAGNOSIS — Z7901 Long term (current) use of anticoagulants: Secondary | ICD-10-CM | POA: Diagnosis not present

## 2017-12-03 DIAGNOSIS — E559 Vitamin D deficiency, unspecified: Secondary | ICD-10-CM | POA: Diagnosis present

## 2017-12-03 DIAGNOSIS — Z87891 Personal history of nicotine dependence: Secondary | ICD-10-CM | POA: Diagnosis not present

## 2017-12-03 DIAGNOSIS — G9340 Encephalopathy, unspecified: Secondary | ICD-10-CM | POA: Diagnosis not present

## 2017-12-03 DIAGNOSIS — R4182 Altered mental status, unspecified: Secondary | ICD-10-CM | POA: Diagnosis present

## 2017-12-03 DIAGNOSIS — E1142 Type 2 diabetes mellitus with diabetic polyneuropathy: Secondary | ICD-10-CM | POA: Diagnosis present

## 2017-12-03 DIAGNOSIS — G9341 Metabolic encephalopathy: Secondary | ICD-10-CM | POA: Diagnosis present

## 2017-12-03 DIAGNOSIS — Z7984 Long term (current) use of oral hypoglycemic drugs: Secondary | ICD-10-CM | POA: Diagnosis not present

## 2017-12-03 DIAGNOSIS — M069 Rheumatoid arthritis, unspecified: Secondary | ICD-10-CM | POA: Diagnosis present

## 2017-12-03 DIAGNOSIS — I10 Essential (primary) hypertension: Secondary | ICD-10-CM | POA: Diagnosis present

## 2017-12-03 DIAGNOSIS — E118 Type 2 diabetes mellitus with unspecified complications: Secondary | ICD-10-CM | POA: Diagnosis not present

## 2017-12-03 DIAGNOSIS — Z96651 Presence of right artificial knee joint: Secondary | ICD-10-CM | POA: Diagnosis present

## 2017-12-03 DIAGNOSIS — Z6841 Body Mass Index (BMI) 40.0 and over, adult: Secondary | ICD-10-CM | POA: Diagnosis not present

## 2017-12-03 DIAGNOSIS — F329 Major depressive disorder, single episode, unspecified: Secondary | ICD-10-CM | POA: Diagnosis present

## 2017-12-03 DIAGNOSIS — R319 Hematuria, unspecified: Secondary | ICD-10-CM | POA: Diagnosis not present

## 2017-12-03 LAB — CBC WITH DIFFERENTIAL/PLATELET
BASOS PCT: 0 %
Basophils Absolute: 0 10*3/uL (ref 0.0–0.1)
EOS ABS: 0.2 10*3/uL (ref 0.0–0.7)
Eosinophils Relative: 2 %
HCT: 38 % — ABNORMAL LOW (ref 39.0–52.0)
HEMOGLOBIN: 12.1 g/dL — AB (ref 13.0–17.0)
Lymphocytes Relative: 16 %
Lymphs Abs: 1.7 10*3/uL (ref 0.7–4.0)
MCH: 27.5 pg (ref 26.0–34.0)
MCHC: 31.8 g/dL (ref 30.0–36.0)
MCV: 86.4 fL (ref 78.0–100.0)
Monocytes Absolute: 0.6 10*3/uL (ref 0.1–1.0)
Monocytes Relative: 6 %
NEUTROS PCT: 76 %
Neutro Abs: 7.7 10*3/uL (ref 1.7–7.7)
Platelets: 189 10*3/uL (ref 150–400)
RBC: 4.4 MIL/uL (ref 4.22–5.81)
RDW: 17 % — ABNORMAL HIGH (ref 11.5–15.5)
WBC: 10.2 10*3/uL (ref 4.0–10.5)

## 2017-12-03 LAB — COMPREHENSIVE METABOLIC PANEL
ALBUMIN: 3.2 g/dL — AB (ref 3.5–5.0)
ALT: 42 U/L (ref 17–63)
AST: 37 U/L (ref 15–41)
Alkaline Phosphatase: 77 U/L (ref 38–126)
Anion gap: 9 (ref 5–15)
BUN: 14 mg/dL (ref 6–20)
CO2: 25 mmol/L (ref 22–32)
CREATININE: 1.11 mg/dL (ref 0.61–1.24)
Calcium: 8.7 mg/dL — ABNORMAL LOW (ref 8.9–10.3)
Chloride: 106 mmol/L (ref 101–111)
GFR calc Af Amer: >60 mL/min (ref >60)
GFR calc non Af Amer: >60 mL/min (ref >60)
GLUCOSE: 235 mg/dL — AB (ref 65–99)
Potassium: 4.1 mmol/L (ref 3.5–5.1)
SODIUM: 140 mmol/L (ref 135–145)
Total Bilirubin: 0.7 mg/dL (ref 0.3–1.2)
Total Protein: 6.8 g/dL (ref 6.5–8.1)

## 2017-12-03 LAB — GLUCOSE, CAPILLARY
GLUCOSE-CAPILLARY: 198 mg/dL — AB (ref 65–99)
GLUCOSE-CAPILLARY: 157 mg/dL — AB (ref 65–99)
GLUCOSE-CAPILLARY: 135 mg/dL — AB (ref 65–99)
Glucose-Capillary: 159 mg/dL — ABNORMAL HIGH (ref 65–99)

## 2017-12-03 LAB — MAGNESIUM: Magnesium: 2.2 mg/dL (ref 1.7–2.4)

## 2017-12-03 NOTE — Progress Notes (Signed)
Patient ID: Andrew Winters, male   DOB: [birth year], 77 y.o.   MRN: [id number]  PROGRESS NOTE    Andrew Winters  JIR:[id number] DOB: [birth year] DOA: 12/02/2017 PCP: Crist Fat, MD   Brief Narrative:  77 year old male with history of dementia, pulmonary emboli, diabetes, hypertension, hyperlipidemia presented on 12/02/2017 with worsening confusion and some bloody urine.  He was admitted for fever secondary to UTI and started on intravenous antibiotics.  Assessment & Plan:   Principal Problem:   UTI (urinary tract infection) Active Problems:   GERD   Morbid obesity (HCC)   Fever   Vascular dementia without behavioral disturbance   Probable UTI causing fever -Continue Rocephin.  Follow urine cultures.  Add blood cultures.  Afebrile since admission.  No hematuria since admission -Hemodynamically stable -Discontinue IV fluids  Leukocytosis -Probably secondary to above.  Resolved.  Probable acute metabolic encephalopathy in a patient with dementia -Probably from UTI.  Mental status improving.  Monitor  Dementia -Monitor mental status.  Fall precaution.  Continue memantine  Essential hypertension -Monitor blood pressure.  Continue lisinopril and Cardizem  History of pulmonary embolism -Continue Xarelto   DVT prophylaxis: Xarelto Code Status: Full Family Communication: Spoke to wife at bedside Disposition Plan: Home in 1 to 3 days pending clinical improvement  Consultants: None  Procedures: None  Antimicrobials: Rocephin from 12/02/2017 onwards   Subjective: Patient seen and examined at bedside.  Patient is awake but confused and is a poor historian.  Wife present at bedside thinks he is almost at his baseline.  No hematuria noted since admission.  No overnight fever or vomiting.  Objective: Vitals:   12/02/17 1645 12/02/17 2130 12/03/17 0130 12/03/17 0537  BP: (!) 149/74 (!) 146/78 (!) 147/75 (!) 168/81  Pulse: 72 65 63 63  Resp: (!) 21 16 13 15   Temp: 97.8 F  (36.6 C) 98.5 F (36.9 C) 98.2 F (36.8 C) 98.2 F (36.8 C)  TempSrc: Oral Oral Oral Oral  SpO2: 97% 93% 96% 97%  Weight:      Height:        Intake/Output Summary (Last 24 hours) at 12/03/2017 1026 Last data filed at 12/03/2017 1025 Gross per 24 hour  Intake 1592.5 ml  Output 725 ml  Net 867.5 ml   Filed Weights   12/02/17 1202  Weight: 125.1 kg (275 lb 12.7 oz)    Examination:  General exam: Appears calm and comfortable.  Awake but confused slightly Respiratory system: Bilateral decreased breath sound at bases Cardiovascular system: S1 & S2 heard, rate controlled  gastrointestinal system: Abdomen is nondistended, soft and nontender. Normal bowel sounds heard. Extremities: No cyanosis, clubbing, edema   Data Reviewed: I have personally reviewed following labs and imaging studies  CBC: Recent Labs  Lab 12/02/17 1225 12/03/17 0915  WBC 14.3* 10.2  NEUTROABS  --  7.7  HGB 12.3* 12.1*  HCT 38.2* 38.0*  MCV 88.2 86.4  PLT 202 189   Basic Metabolic Panel: Recent Labs  Lab 12/02/17 1225  NA 139  K 4.0  CL 102  CO2 26  GLUCOSE 222*  BUN 12  CREATININE 1.15  CALCIUM 9.2   GFR: Estimated Creatinine Clearance: 71.5 mL/min (by C-G formula based on SCr of 1.15 mg/dL). Liver Function Tests: Recent Labs  Lab 12/02/17 1225  AST 64*  ALT 54  ALKPHOS 90  BILITOT 0.4  PROT 7.6  ALBUMIN 3.7   No results for input(s): LIPASE, AMYLASE in the last 168 hours. No results for input(s): AMMONIA  in the last 168 hours. Coagulation Profile: No results for input(s): INR, PROTIME in the last 168 hours. Cardiac Enzymes: No results for input(s): CKTOTAL, CKMB, CKMBINDEX, TROPONINI in the last 168 hours. BNP (last 3 results) No results for input(s): PROBNP in the last 8760 hours. HbA1C: No results for input(s): HGBA1C in the last 72 hours. CBG: Recent Labs  Lab 12/02/17 1159 12/02/17 1729 12/02/17 2132 12/03/17 0724  GLUCAP 211* 115* 126* 159*   Lipid  Profile: No results for input(s): CHOL, HDL, LDLCALC, TRIG, CHOLHDL, LDLDIRECT in the last 72 hours. Thyroid Function Tests: No results for input(s): TSH, T4TOTAL, FREET4, T3FREE, THYROIDAB in the last 72 hours. Anemia Panel: No results for input(s): VITAMINB12, FOLATE, FERRITIN, TIBC, IRON, RETICCTPCT in the last 72 hours. Sepsis Labs: No results for input(s): PROCALCITON, LATICACIDVEN in the last 168 hours.  No results found for this or any previous visit (from the past 240 hour(s)).       Radiology Studies: Dg Chest 2 View  Result Date: 12/02/2017 CLINICAL DATA:  Altered mental status, fever. EXAM: CHEST - 2 VIEW COMPARISON:  Radiograph of September 26, 2017. FINDINGS: Stable cardiomediastinal silhouette. No pneumothorax or pleural effusion is noted. Both lungs are clear. The visualized skeletal structures are unremarkable. IMPRESSION: No active cardiopulmonary disease. Electronically Signed   By: Lupita Raider, M.D.   On: 12/02/2017 12:34   US Renal  Result Date: 12/02/2017 CLINICAL DATA:  Hematuria today.  Urinary tract infection. EXAM: RENAL / URINARY TRACT ULTRASOUND COMPLETE COMPARISON:  Abdomen and pelvis CT dated 07/08/2016. FINDINGS: Right Kidney: Length: 11.0 cm. Echogenicity within normal limits. No mass or hydronephrosis visualized. Left Kidney: Length: 10.5 cm. Echogenicity within normal limits. No mass or hydronephrosis visualized. Bladder: Markedly enlarged prostate gland protruding into the base of the urinary bladder. The prostate measures 6.8 x 5.7 x 5.4 cm. Otherwise, normal appearing bladder without wall thickening. IMPRESSION: 1. Normal appearing kidneys. 2. Markedly enlarged prostate gland protruding into the base of the urinary bladder. Electronically Signed   By: Beckie Salts M.D.   On: 12/02/2017 16:23        Scheduled Meds: . atorvastatin  40 mg Oral QHS  . cholecalciferol  1,000 Units Oral BID  . diltiazem  60 mg Oral BID  . ferrous sulfate  325 mg Oral Q  breakfast  . gabapentin  100 mg Oral 2 times per day   And  . gabapentin  200 mg Oral QHS  . insulin aspart  0-9 Units Subcutaneous TID WC  . lisinopril  10 mg Oral Daily  . memantine  5 mg Oral Daily  . multivitamin with minerals  1 tablet Oral Daily  . rivaroxaban  20 mg Oral Q breakfast  . sertraline  50 mg Oral Daily  . traZODone  50 mg Oral QHS  . vitamin E  400 Units Oral Daily   Continuous Infusions: . sodium chloride 75 mL/hr at 12/02/17 2241  . cefTRIAXone (ROCEPHIN)  IV       LOS: 0 days        Glade Lloyd, MD [facility] Pager [phone]  If 7PM-7AM, please contact night-coverage [email or web address] Password [facility] 12/03/2017, 10:26 AM

## 2017-12-04 DIAGNOSIS — E118 Type 2 diabetes mellitus with unspecified complications: Secondary | ICD-10-CM

## 2017-12-04 LAB — BASIC METABOLIC PANEL
Anion gap: 11 (ref 5–15)
BUN: 17 mg/dL (ref 6–20)
CHLORIDE: 104 mmol/L (ref 101–111)
CO2: 21 mmol/L — AB (ref 22–32)
CREATININE: 1.28 mg/dL — AB (ref 0.61–1.24)
Calcium: 8.6 mg/dL — ABNORMAL LOW (ref 8.9–10.3)
GFR calc Af Amer: >60 mL/min (ref >60)
GFR calc non Af Amer: 53 mL/min — ABNORMAL LOW (ref >60)
Glucose, Bld: 243 mg/dL — ABNORMAL HIGH (ref 65–99)
Potassium: 3.8 mmol/L (ref 3.5–5.1)
SODIUM: 136 mmol/L (ref 135–145)

## 2017-12-04 LAB — CBC WITH DIFFERENTIAL/PLATELET
Basophils Absolute: 0 10*3/uL (ref 0.0–0.1)
Basophils Relative: 0 %
EOS ABS: 0.2 10*3/uL (ref 0.0–0.7)
Eosinophils Relative: 2 %
HEMATOCRIT: 37.6 % — AB (ref 39.0–52.0)
HEMOGLOBIN: 12.1 g/dL — AB (ref 13.0–17.0)
LYMPHS ABS: 1.8 10*3/uL (ref 0.7–4.0)
Lymphocytes Relative: 21 %
MCH: 27.9 pg (ref 26.0–34.0)
MCHC: 32.2 g/dL (ref 30.0–36.0)
MCV: 86.8 fL (ref 78.0–100.0)
MONO ABS: 0.7 10*3/uL (ref 0.1–1.0)
MONOS PCT: 8 %
NEUTROS PCT: 69 %
Neutro Abs: 5.9 10*3/uL (ref 1.7–7.7)
Platelets: 186 10*3/uL (ref 150–400)
RBC: 4.33 MIL/uL (ref 4.22–5.81)
RDW: 16.9 % — ABNORMAL HIGH (ref 11.5–15.5)
WBC: 8.6 10*3/uL (ref 4.0–10.5)

## 2017-12-04 LAB — MAGNESIUM: Magnesium: 2.1 mg/dL (ref 1.7–2.4)

## 2017-12-04 LAB — GLUCOSE, CAPILLARY: Glucose-Capillary: 200 mg/dL — ABNORMAL HIGH (ref 65–99)

## 2017-12-04 MED ORDER — GABAPENTIN 100 MG PO CAPS: 100.0000 mg | ORAL_CAPSULE | ORAL | Status: AC

## 2017-12-04 MED ORDER — FUROSEMIDE 20 MG PO TABS: 20.0000 mg | ORAL_TABLET | Freq: Every day | ORAL | Status: AC

## 2017-12-04 MED ORDER — CEPHALEXIN 500 MG PO CAPS: 500.0000 mg | ORAL_CAPSULE | Freq: Three times a day (TID) | ORAL | 0 refills | Status: AC

## 2017-12-04 MED ORDER — RIVAROXABAN 20 MG PO TABS
20.0000 mg | ORAL_TABLET | Freq: Every day | ORAL | Status: DC
Start: 2017-12-04 — End: 2022-08-29

## 2017-12-04 NOTE — Progress Notes (Signed)
Pt reports pain at IV insertion site. Area around site is firm to touch, pt reports that site is very tender to  Touch. IV Team notified to obtain new access. IV nurse reported to nurses station at approx [phone] reporting several unsuccessful attempts at initianing IV access. Pt & spouse declined additional attempts to obtain IV access.

## 2017-12-04 NOTE — Discharge Summary (Signed)
Physician Discharge Summary  Andrew Winters KJZ:[id number] DOB: 1941/06/01 DOA: 12/02/2017  PCP: Crist Fat, MD  Admit date: 12/02/2017 Discharge date: 12/04/2017  Admitted From: Home Disposition:  Home  Recommendations for Outpatient Follow-up:  1. Follow up with PCP in 1 week with repeat CBC/BMP  Home Health: No Equipment/Devices: None Discharge Condition: Stable CODE STATUS: Full Diet recommendation: Heart Healthy / Carb Modified  Brief/Interim Summary: 77 year old male with history of dementia, pulmonary emboli, diabetes, hypertension, hyperlipidemia presented on 12/02/2017 with worsening confusion and some bloody urine.  He was admitted for fever secondary to UTI and started on intravenous antibiotics.  Patient's condition improved, mental status improved back to baseline.  No temperature spikes since admission.  Cultures have been negative so far.  Wife thinks that patient is ready to be discharged.  Patient will be discharged on oral Keflex.   Discharge Diagnoses:  Principal Problem:   UTI (urinary tract infection) Active Problems:   GERD   Morbid obesity (HCC)   Fever   Vascular dementia without behavioral disturbance  Probable UTI causing fever -Currently on Rocephin.    Cultures have been negative so far.  Afebrile since admission.  No hematuria since admission -Hemodynamically stable -Tolerating diet.   -Wife thinks that patient is stable for discharge.  Discharge home on oral Keflex. -Might need outpatient urology evaluation  Leukocytosis -Probably secondary to above.  Resolved.  No labs for today.  Probable acute metabolic encephalopathy in a patient with dementia -Probably from UTI.  Mental status is probably back to baseline.  Dementia - Continue memantine  Essential hypertension -Blood pressure stable. Continue lisinopril, Lasix and Cardizem  History of pulmonary embolism -Continue Xarelto    Discharge Instructions  Discharge Instructions     Call MD for:  difficulty breathing, headache or visual disturbances   Complete by:  As directed    Call MD for:  extreme fatigue   Complete by:  As directed    Call MD for:  hives   Complete by:  As directed    Call MD for:  persistant dizziness or light-headedness   Complete by:  As directed    Call MD for:  persistant nausea and vomiting   Complete by:  As directed    Call MD for:  severe uncontrolled pain   Complete by:  As directed    Call MD for:  temperature >100.4   Complete by:  As directed    Diet - low sodium heart healthy   Complete by:  As directed    Diet Carb Modified   Complete by:  As directed    Increase activity slowly   Complete by:  As directed      Allergies as of 12/04/2017      Reactions   Aspirin Diarrhea      Medication List    TAKE these medications   acetaminophen 500 MG tablet Commonly known as:  TYLENOL Take 1,000 mg by mouth daily as needed for mild pain, moderate pain or headache.   atorvastatin 40 MG tablet Commonly known as:  LIPITOR Take 40 mg by mouth at bedtime.   cephALEXin 500 MG capsule Commonly known as:  KEFLEX Take 1 capsule (500 mg total) by mouth 3 (three) times daily for 5 days.   cetirizine 10 MG tablet Commonly known as:  ZYRTEC Take 10 mg by mouth daily.   cholecalciferol 1000 units tablet Commonly known as:  VITAMIN D Take 1,000 Units by mouth 2 (two) times daily.  diltiazem 60 MG 12 hr capsule Commonly known as:  CARDIZEM SR Take 60 mg by mouth 2 (two) times daily.   ferrous sulfate 325 (65 FE) MG tablet Take 325 mg by mouth daily with breakfast.   furosemide 20 MG tablet Commonly known as:  LASIX Take 1 tablet (20 mg total) by mouth daily.   gabapentin 100 MG capsule Commonly known as:  NEURONTIN Take 1-2 capsules (100-200 mg total) by mouth See admin instructions. 100mg  in the morning and at noon and 200mg  at bedtime What changed:    how much to take  when to take this  additional  instructions   glimepiride 4 MG tablet Commonly known as:  AMARYL Take 4 mg by mouth 2 (two) times daily.   guaifenesin 400 MG Tabs tablet Commonly known as:  HUMIBID E Take 400 mg by mouth every 6 (six) hours as needed (for cough and congestion).   lisinopril 10 MG tablet Commonly known as:  PRINIVIL,ZESTRIL Take 10 mg by mouth daily.   memantine 5 MG tablet Commonly known as:  NAMENDA Take 1 tablet (5 mg total) by mouth daily.   multivitamin with minerals Tabs tablet Take 1 tablet by mouth daily.   omeprazole 20 MG capsule Commonly known as:  PRILOSEC Take 40 mg by mouth daily.   potassium chloride 10 MEQ tablet Commonly known as:  K-DUR Take 10 mEq by mouth daily.   ranitidine 150 MG tablet Commonly known as:  ZANTAC Take 150 mg by mouth at bedtime.   rivaroxaban 20 MG Tabs tablet Commonly known as:  XARELTO Take 1 tablet (20 mg total) by mouth daily. What changed:    when to take this  additional instructions   sertraline 50 MG tablet Commonly known as:  ZOLOFT Take 50 mg by mouth daily.   traMADol 50 MG tablet Commonly known as:  ULTRAM Take 50 mg by mouth every 8 (eight) hours as needed for moderate pain.   traZODone 50 MG tablet Commonly known as:  DESYREL Take 50 mg by mouth at bedtime.   vitamin E 400 UNIT capsule Take 400 Units by mouth daily.      Follow-up Information    Leonia Reader, Barbara Cower, MD. Schedule an appointment as soon as possible for a visit in 1 week(s).   Specialty:  Internal Medicine Why:  with repeat cbc/bmp Contact information: [address] [place] [address] [phone]          Allergies  Allergen Reactions  . Aspirin Diarrhea    Consultations:  None   Procedures/Studies: Dg Chest 2 View  Result Date: 12/02/2017 CLINICAL DATA:  Altered mental status, fever. EXAM: CHEST - 2 VIEW COMPARISON:  Radiograph of September 26, 2017. FINDINGS: Stable cardiomediastinal silhouette. No pneumothorax or pleural effusion  is noted. Both lungs are clear. The visualized skeletal structures are unremarkable. IMPRESSION: No active cardiopulmonary disease. Electronically Signed   By: Lupita Raider, M.D.   On: 12/02/2017 12:34   US Renal  Result Date: 12/02/2017 CLINICAL DATA:  Hematuria today.  Urinary tract infection. EXAM: RENAL / URINARY TRACT ULTRASOUND COMPLETE COMPARISON:  Abdomen and pelvis CT dated 07/08/2016. FINDINGS: Right Kidney: Length: 11.0 cm. Echogenicity within normal limits. No mass or hydronephrosis visualized. Left Kidney: Length: 10.5 cm. Echogenicity within normal limits. No mass or hydronephrosis visualized. Bladder: Markedly enlarged prostate gland protruding into the base of the urinary bladder. The prostate measures 6.8 x 5.7 x 5.4 cm. Otherwise, normal appearing bladder without wall  thickening. IMPRESSION: 1. Normal appearing kidneys. 2. Markedly enlarged prostate gland protruding into the base of the urinary bladder. Electronically Signed   By: Beckie Salts M.D.   On: 12/02/2017 16:23     Subjective: Patient seen and examined at bedside.  He is awake but slightly confused but pleasant.  No overnight fever, nausea or vomiting.  Discharge Exam: Vitals:   12/04/17 0554 12/04/17 1013  BP: (!) 168/89 130/68  Pulse: 64 78  Resp: 17 20  Temp: 98.2 F (36.8 C) 98 F (36.7 C)  SpO2: 99% 98%   Vitals:   12/03/17 2055 12/04/17 0142 12/04/17 0554 12/04/17 1013  BP: (!) 136/59 132/73 (!) 168/89 130/68  Pulse: 74 78 64 78  Resp: 15  17 20   Temp: 98.4 F (36.9 C) 98.3 F (36.8 C) 98.2 F (36.8 C) 98 F (36.7 C)  TempSrc: Oral Oral Oral Oral  SpO2: 99% 98% 99% 98%  Weight:      Height:        General: Awake, not in acute distress Cardiovascular: Rate controlled, S1/S2 + Respiratory: Bilateral decreased breath sounds at bases  abdominal: Soft, NT, ND, bowel sounds + Extremities: no edema, no cyanosis    The results of significant diagnostics from this hospitalization (including  imaging, microbiology, ancillary and laboratory) are listed below for reference.     Microbiology: Recent Results (from the past 240 hour(s))  Urine C&S     Status: None (Preliminary result)   Collection Time: 12/02/17 11:50 AM  Result Value Ref Range Status   Specimen Description   Final    URINE, CLEAN CATCH Performed at [facility], [address] [place], [place]    Special Requests   Final    NONE Performed at [facility], [address] [place], [place]    Culture   Final    CULTURE REINCUBATED FOR BETTER GROWTH Performed at [facility], [address], [place], [place]    Report Status PENDING  Incomplete     Labs: BNP (last 3 results) No results for input(s): BNP in the last 8760 hours. Basic Metabolic Panel: Recent Labs  Lab 12/02/17 1225 12/03/17 0915 12/04/17 0413  NA 139 140 136  K 4.0 4.1 3.8  CL 102 106 104  CO2 26 25 21*  GLUCOSE 222* 235* 243*  BUN 12 14 17   CREATININE 1.15 1.11 1.28*  CALCIUM 9.2 8.7* 8.6*  MG  --  2.2 2.1   Liver Function Tests: Recent Labs  Lab 12/02/17 1225 12/03/17 0915  AST 64* 37  ALT 54 42  ALKPHOS 90 77  BILITOT 0.4 0.7  PROT 7.6 6.8  ALBUMIN 3.7 3.2*   No results for input(s): LIPASE, AMYLASE in the last 168 hours. No results for input(s): AMMONIA in the last 168 hours. CBC: Recent Labs  Lab 12/02/17 1225 12/03/17 0915 12/04/17 0413  WBC 14.3* 10.2 8.6  NEUTROABS  --  7.7 5.9  HGB 12.3* 12.1* 12.1*  HCT 38.2* 38.0* 37.6*  MCV 88.2 86.4 86.8  PLT 202 189 186   Cardiac Enzymes: No results for input(s): CKTOTAL, CKMB, CKMBINDEX, TROPONINI in the last 168 hours. BNP: Invalid input(s): POCBNP CBG: Recent Labs  Lab 12/03/17 0724 12/03/17 1159 12/03/17 1601 12/03/17 2050 12/04/17 0726  GLUCAP 159* 198* 157* 135* 200*   D-Dimer No results for input(s): DDIMER in the last 72 hours. Hgb A1c No results for input(s): HGBA1C  in the  last 72 hours. Lipid Profile No results for input(s): CHOL, HDL, LDLCALC, TRIG, CHOLHDL, LDLDIRECT in the last 72 hours. Thyroid function studies No results for input(s): TSH, T4TOTAL, T3FREE, THYROIDAB in the last 72 hours.  Invalid input(s): FREET3 Anemia work up No results for input(s): VITAMINB12, FOLATE, FERRITIN, TIBC, IRON, RETICCTPCT in the last 72 hours. Urinalysis    Component Value Date/Time   COLORURINE YELLOW 12/02/2017 1150   APPEARANCEUR CLEAR 12/02/2017 1150   LABSPEC 1.015 12/02/2017 1150   PHURINE 8.0 12/02/2017 1150   GLUCOSEU 50 (A) 12/02/2017 1150   HGBUR NEGATIVE 12/02/2017 1150   BILIRUBINUR NEGATIVE 12/02/2017 1150   BILIRUBINUR negative 10/08/2015 1925   KETONESUR NEGATIVE 12/02/2017 1150   PROTEINUR NEGATIVE 12/02/2017 1150   UROBILINOGEN 1.0 10/08/2015 1925   UROBILINOGEN 0.2 01/07/2015 2223   NITRITE NEGATIVE 12/02/2017 1150   LEUKOCYTESUR TRACE (A) 12/02/2017 1150   Sepsis Labs Invalid input(s): PROCALCITONIN,  WBC,  LACTICIDVEN Microbiology Recent Results (from the past 240 hour(s))  Urine C&S     Status: None (Preliminary result)   Collection Time: 12/02/17 11:50 AM  Result Value Ref Range Status   Specimen Description   Final    URINE, CLEAN CATCH Performed at [facility], [address], [place], [place] [address]    Special Requests   Final    NONE Performed at [facility], [address] [place], [place] [address]    Culture   Final    CULTURE REINCUBATED FOR BETTER GROWTH Performed at [facility], [address], [place], [place] [address]    Report Status PENDING  Incomplete     Time coordinating discharge: 35 minutes  SIGNED:   Glade Lloyd, MD  [facility] 12/04/2017, 10:50 AM Pager: [phone]  If 7PM-7AM, please contact night-coverage [email or web address] Password [email or web address]

## 2017-12-04 NOTE — Evaluation (Signed)
Physical Therapy Evaluation Patient Details Name: Andrew Winters MRN: [id number] DOB: Aug 13, 1940 Today's Date: 12/04/2017   History of Present Illness  Pt admitted secondary to severe sepsis from suspected UTI. Pt also with AMS. Pt with PMH of Dementia, HTN, DM, RA, PE, COPD, peripheral neuropathy, L ear deafness, and R TKA.   Clinical Impression  Patient evaluated by Physical Therapy with no further acute PT needs identified. All education has been completed and the patient has no further questions.  See below for any follow-up Physical Therapy or equipment needs. PT is signing off. Thank you for this referral.     Follow Up Recommendations No PT follow up    Equipment Recommendations       Recommendations for Other Services       Precautions / Restrictions Precautions Precautions: Fall      Mobility  Bed Mobility Overal bed mobility: Modified Independent                Transfers Overall transfer level: Needs assistance Equipment used: Rolling walker (2 wheeled) Transfers: Sit to/from Stand Sit to Stand: Supervision         General transfer comment: cues for hand placement  Ambulation/Gait Ambulation/Gait assistance: Min guard;Supervision Ambulation Distance (Feet): 200 Feet Assistive device: Rolling walker (2 wheeled) Gait Pattern/deviations: Step-through pattern     General Gait Details: cues for RW position/distance from self  Stairs            Wheelchair Mobility    Modified Rankin (Stroke Patients Only)       Balance Overall balance assessment: Mild deficits observed, not formally tested   Sitting balance-Leahy Scale: Good         Standing balance comment: reliant on UE support                             Pertinent Vitals/Pain Pain Assessment: No/denies pain    Home Living Family/patient expects to be discharged to:: Private residence Living Arrangements: Spouse/significant other Available Help at Discharge:  Family;Available 24 hours/day Type of Home: House Home Access: Ramped entrance     Home Layout: One level Home Equipment: Walker - 2 wheels;Cane - single point;Bedside commode      Prior Function Level of Independence: Independent with assistive device(s)         Comments: Reports using cane at baseline, walker recently     Hand Dominance        Extremity/Trunk Assessment   Upper Extremity Assessment Upper Extremity Assessment: Overall WFL for tasks assessed    Lower Extremity Assessment Lower Extremity Assessment: Overall WFL for tasks assessed       Communication   Communication: No difficulties  Cognition Arousal/Alertness: Awake/alert Behavior During Therapy: WFL for tasks assessed/performed Overall Cognitive Status: History of cognitive impairments - at baseline                                        General Comments      Exercises     Assessment/Plan    PT Assessment Patent does not need any further PT services  PT Problem List         PT Treatment Interventions      PT Goals (Current goals can be found in the Care Plan section)  Acute Rehab PT Goals Patient Stated Goal: home  PT Goal Formulation: All assessment  and education complete, DC therapy Potential to Achieve Goals: Good    Frequency     Barriers to discharge        Co-evaluation               AM-PAC PT "6 Clicks" Daily Activity  Outcome Measure Difficulty turning over in bed (including adjusting bedclothes, sheets and blankets)?: A Little Difficulty moving from lying on back to sitting on the side of the bed? : A Little Difficulty sitting down on and standing up from a chair with arms (e.g., wheelchair, bedside commode, etc,.)?: A Little Help needed moving to and from a bed to chair (including a wheelchair)?: A Little Help needed walking in hospital room?: A Little Help needed climbing 3-5 steps with a railing? : A Little 6 Click Score: 18    End of  Session Equipment Utilized During Treatment: Gait belt Activity Tolerance: Patient tolerated treatment well Patient left: in chair;with chair alarm set;with family/visitor present   PT Visit Diagnosis: Difficulty in walking, not elsewhere classified (R26.2)    Time: 2263-3354 PT Time Calculation (min) (ACUTE ONLY): 18 min   Charges:   PT Evaluation $PT Eval Low Complexity: 1 Low     PT G Codes:          Madia Carvell 2017-12-25, 1:30 PM

## 2017-12-04 NOTE — Progress Notes (Signed)
Discharge instructions discussed with patient and family verbalized agreement and understanding 

## 2017-12-05 LAB — URINE CULTURE

## 2017-12-08 LAB — CULTURE, BLOOD (ROUTINE X 2)
CULTURE: NO GROWTH
CULTURE: NO GROWTH

## 2018-01-24 DIAGNOSIS — E1165 Type 2 diabetes mellitus with hyperglycemia: Secondary | ICD-10-CM | POA: Diagnosis not present

## 2018-01-24 DIAGNOSIS — J40 Bronchitis, not specified as acute or chronic: Secondary | ICD-10-CM | POA: Diagnosis not present

## 2018-05-04 DIAGNOSIS — G309 Alzheimer's disease, unspecified: Secondary | ICD-10-CM | POA: Diagnosis not present

## 2018-05-04 DIAGNOSIS — E1165 Type 2 diabetes mellitus with hyperglycemia: Secondary | ICD-10-CM | POA: Diagnosis not present

## 2018-05-04 DIAGNOSIS — Z1389 Encounter for screening for other disorder: Secondary | ICD-10-CM | POA: Diagnosis not present

## 2018-05-04 DIAGNOSIS — Z0000 Encounter for general adult medical examination without abnormal findings: Secondary | ICD-10-CM | POA: Diagnosis not present

## 2018-05-04 DIAGNOSIS — I872 Venous insufficiency (chronic) (peripheral): Secondary | ICD-10-CM | POA: Diagnosis not present

## 2018-05-04 DIAGNOSIS — Z23 Encounter for immunization: Secondary | ICD-10-CM | POA: Diagnosis not present

## 2018-05-04 DIAGNOSIS — I10 Essential (primary) hypertension: Secondary | ICD-10-CM | POA: Diagnosis not present

## 2018-06-17 IMAGING — RF DG ESOPHAGUS
13 of 19 series · 14 of 21 positions shown · non-contrast
Comparison: 05/20/2016 chest CT.  06/14/2011 esophagram.

CLINICAL DATA: 75-year-old male with dysphagia. Prior dilation x5.
History of dysmotility. Initial encounter.

EXAM:
ESOPHOGRAM/BARIUM SWALLOW
TECHNIQUE: Combined double contrast and single contrast examination performed
using effervescent crystals, thick barium liquid, and thin barium
liquid.
FLUOROSCOPY TIME:  Fluoroscopy Time:  1 minutes and 18 seconds
Radiation Exposure Index (if provided by the fluoroscopic device):
49.7 mGy
Number of Acquired Spot Images: 19

[Series 1: cp_standard · 0.25mm/px · 1 of 1 slices shown (1 of 12)]
[im 1/1]
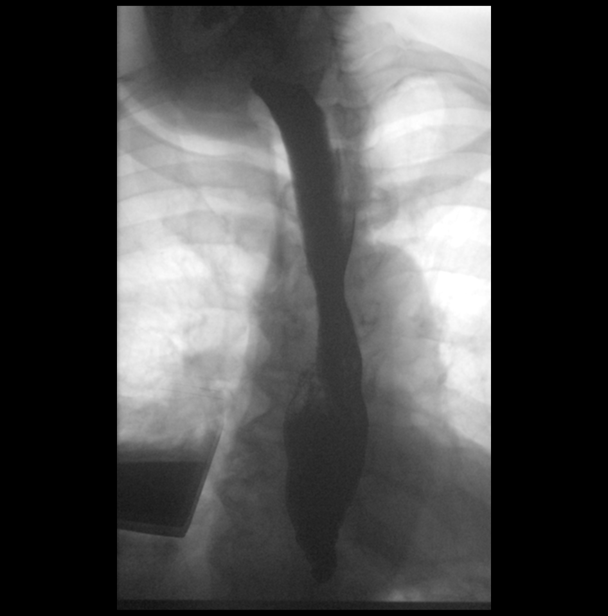

[Series 3: cp_standard · 0.25mm/px · 1 of 1 slices shown (2 of 12)]
[im 1/1]
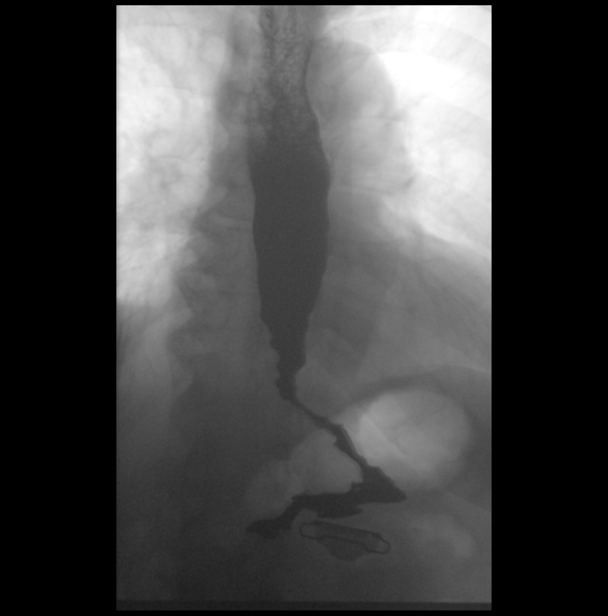

[Series 4: cp_standard · 0.25mm/px · 1 of 1 slices shown (3 of 12)]
[im 1/1]
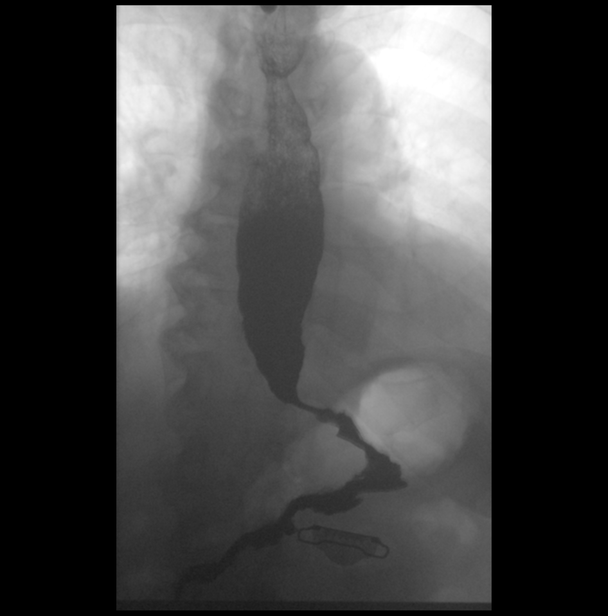

[Series 6: cp_standard · 0.25mm/px · 1 of 1 slices shown (4 of 12)]
[im 1/1]
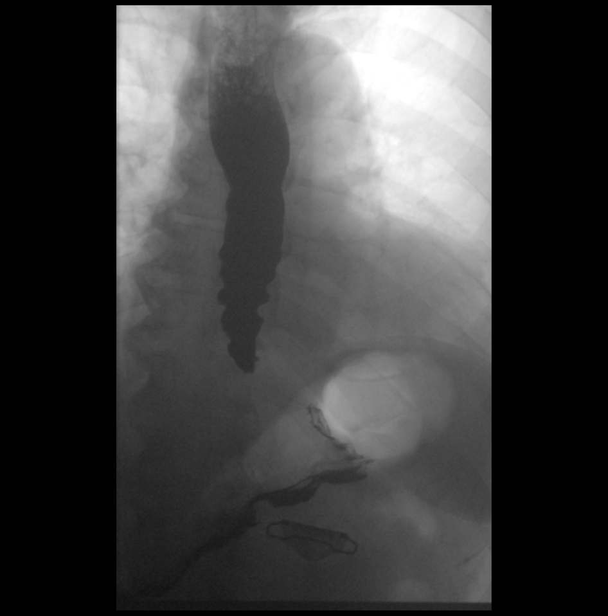

[Series 7: cp_standard · 0.25mm/px · 1 of 1 slices shown (5 of 12)]
[im 1/1]
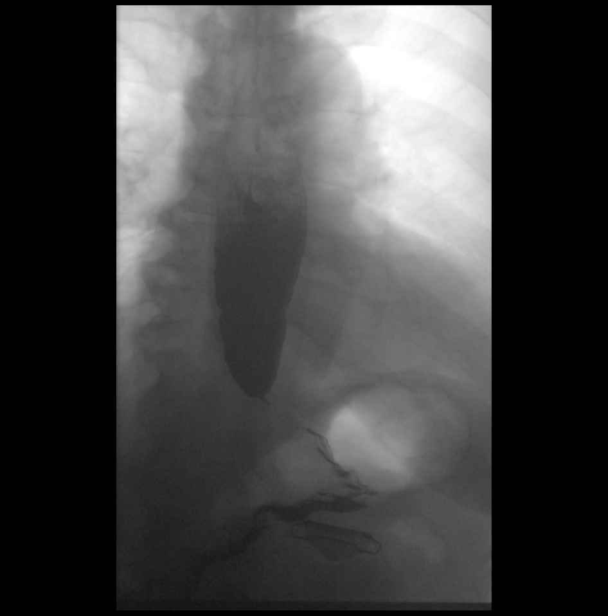

[Series 9: cp_standard · 0.25mm/px · 1 of 1 slices shown (6 of 12)]
[im 1/1]
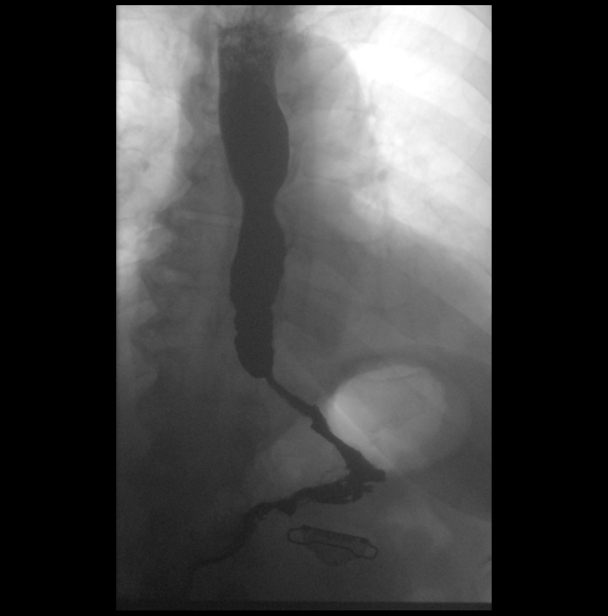

[Series 10: cp_standard · 0.25mm/px · 1 of 1 slices shown (7 of 12)]
[im 1/1]
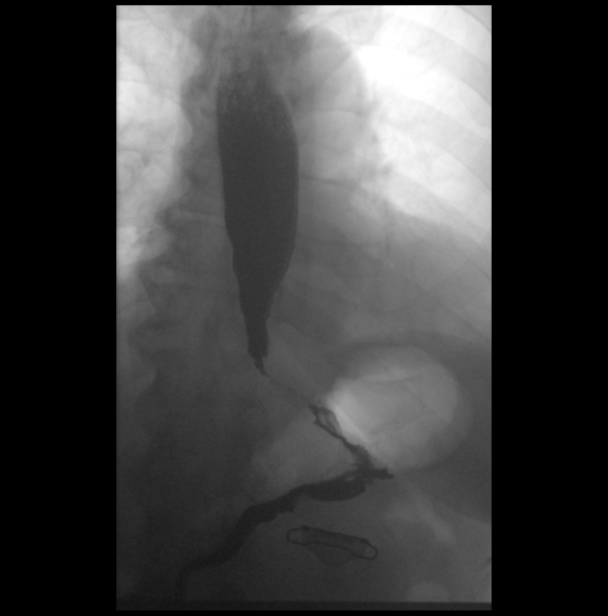

[Series 12: cp_standard · 0.25mm/px · 1 of 1 slices shown (8 of 12)]
[im 1/1]
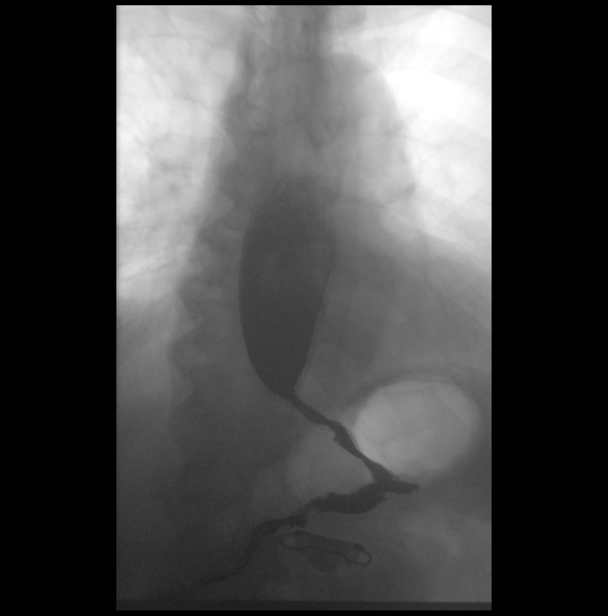

[Series 13: cp_standard · 0.25mm/px · 1 of 1 slices shown (9 of 12)]
[im 1/1]
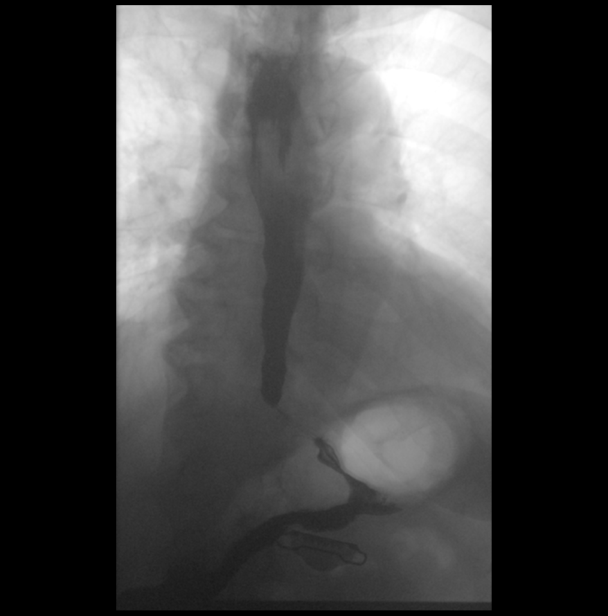

[Series 15: cp_standard · 0.25mm/px · 1 of 1 slices shown (10 of 12)]
[im 1/1]
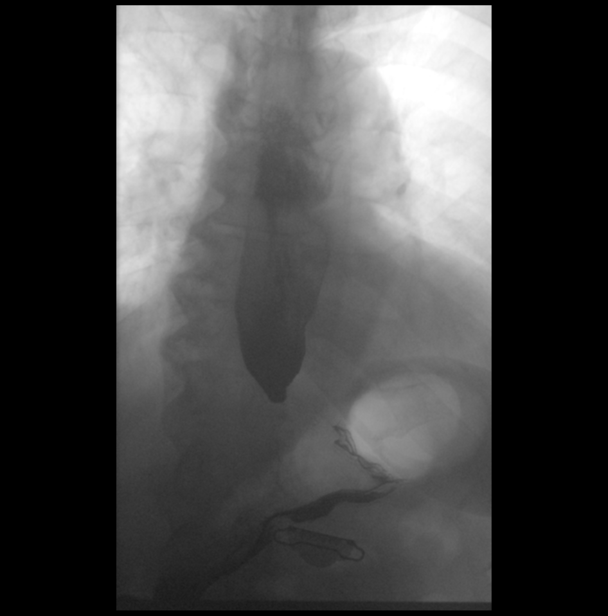

[Series 16: cp_standard · 0.25mm/px · 1 of 1 slices shown (11 of 12)]
[im 1/1]
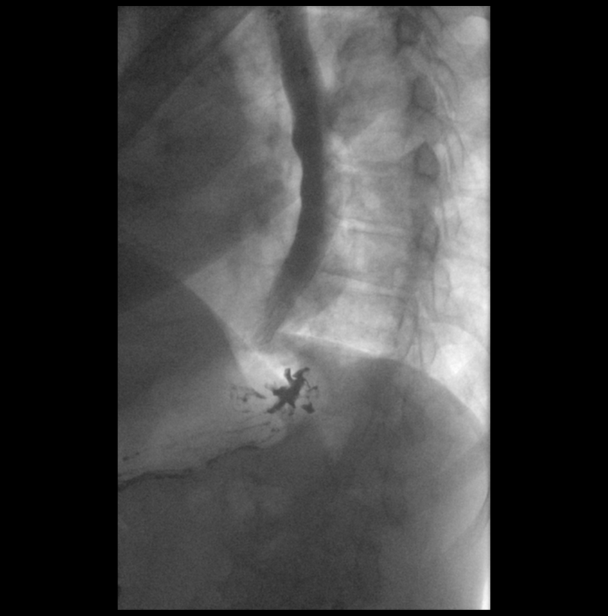

[Series 17: fluoro_barium 2fps_bw · 0.18mm/px · 2 of 19 frames shown]
[frame 10/19]
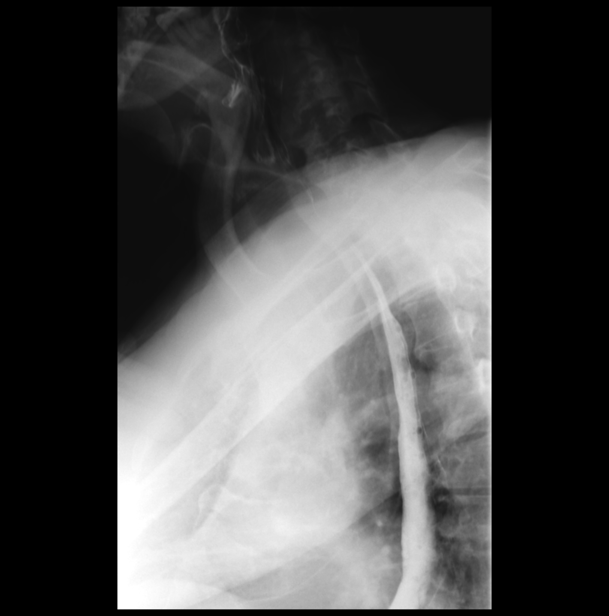
[frame 17/19]
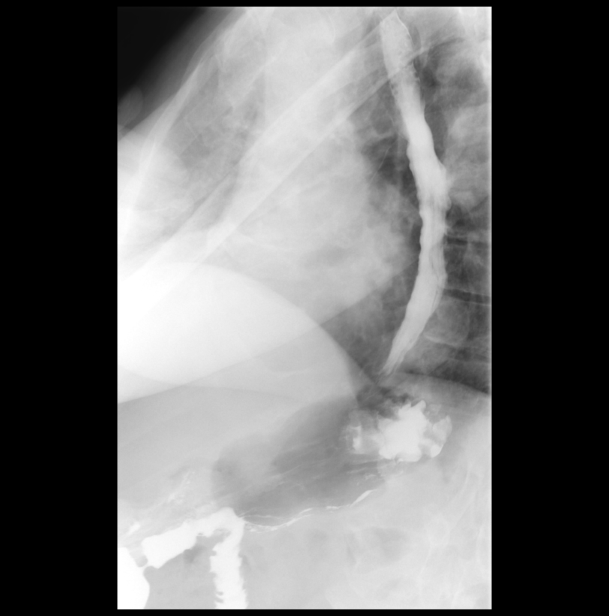

[Series 19: cp_standard · 0.27mm/px · 1 of 1 slices shown (12 of 12)]
[im 1/1]
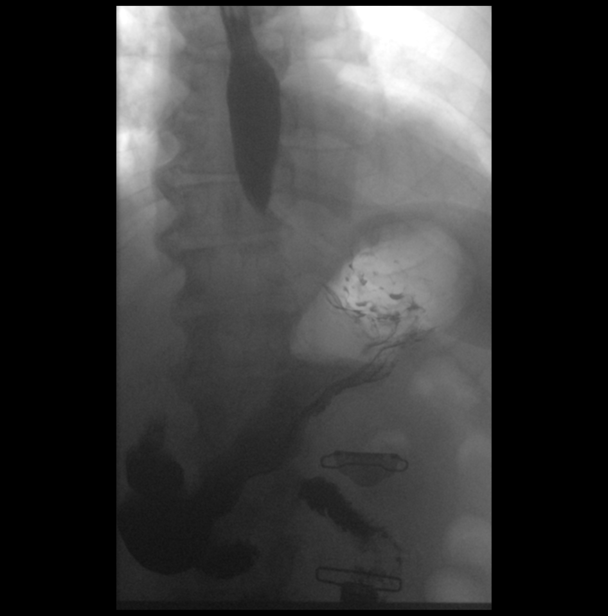

[14 of 21 positions shown; findings below may reference images not displayed]

FINDINGS: No aspiration detected.

Impression upon the posterior aspect of the cervical esophagus by
anterior osteophytes mid to lower cervical spine.

Marked presbyesophagus with tertiary wave contractions. Retained
secretions.

3 cm stricture distal esophagus with smooth contour.

Barium tablet was not ingested given the degree of the stricture.

Reflux with the patient placed in a supine position. This reaches
level upper thoracic esophagus.

No obvious mucosa abnormality.
IMPRESSION: Marked presbyesophagus with poor esophageal motility.

3 cm smooth stricture distal esophagus. Barium tablet was not
ingested given the degree of this stricture. Patient was informed of
results and informed that he had to puree food.

Reflux to the level of the upper thoracic esophagus when patient
placed in a supine position.

## 2018-06-28 DIAGNOSIS — E119 Type 2 diabetes mellitus without complications: Secondary | ICD-10-CM | POA: Diagnosis not present

## 2018-07-26 DIAGNOSIS — H26493 Other secondary cataract, bilateral: Secondary | ICD-10-CM | POA: Diagnosis not present

## 2018-08-10 DIAGNOSIS — G894 Chronic pain syndrome: Secondary | ICD-10-CM | POA: Diagnosis not present

## 2018-08-10 DIAGNOSIS — I10 Essential (primary) hypertension: Secondary | ICD-10-CM | POA: Diagnosis not present

## 2018-08-10 DIAGNOSIS — R413 Other amnesia: Secondary | ICD-10-CM | POA: Diagnosis not present

## 2018-08-10 DIAGNOSIS — E119 Type 2 diabetes mellitus without complications: Secondary | ICD-10-CM | POA: Diagnosis not present

## 2018-08-27 ENCOUNTER — Encounter: Payer: Self-pay | Admitting: Gastroenterology

## 2018-08-27 ENCOUNTER — Other Ambulatory Visit: Payer: Self-pay

## 2018-08-27 ENCOUNTER — Ambulatory Visit: Payer: Medicare HMO | Admitting: Gastroenterology

## 2018-08-27 VITALS — BP 108/70 | HR 84 | Ht 69.5 in | Wt 265.1 lb

## 2018-08-27 DIAGNOSIS — K220 Achalasia of cardia: Secondary | ICD-10-CM

## 2018-08-27 DIAGNOSIS — R1310 Dysphagia, unspecified: Secondary | ICD-10-CM | POA: Diagnosis not present

## 2018-08-27 DIAGNOSIS — R1319 Other dysphagia: Secondary | ICD-10-CM

## 2018-08-27 DIAGNOSIS — Z7901 Long term (current) use of anticoagulants: Secondary | ICD-10-CM

## 2018-08-27 NOTE — Progress Notes (Signed)
[facility] Progress Note  Chief Complaint: Dysphagia  Subjective  History:  This is a 78 year old man brought by his wife concerns about worsening dysphagia. He was last seen nearly 2 years ago for dysphagia and work-up showing an achalasia-like picture on barium study and manometry.  He underwent 20 mm balloon dilation by Dr. Adela Lank while hospitalized.  It was difficult to tell how much that had improved his symptoms when I last saw him at a clinic visit March 2018.   May 2018 I did EGD with BoTox. He has not followed up in clinic since then.  Mr. Carbin is a poor historian due to his dementia.  His wife recalls that the Botox injection gave at least several months worth of relief from the dysphagia.  She has been trying to get him back in here for quite a while but he was resistant.  He has dysphagia to both liquids and solids, as frequently spitting up mucus after meals.  His wife also reports that his primary care provider in  put him on iron over a year ago because he was reportedly anemic.  None of those records are available.  This gentleman has never had a colonoscopy.  His wife is not aware of any rectal bleeding.  ROS: Cardiovascular:  no chest pain Respiratory: no dyspnea He has not been losing weight Remainder of systems are negative as near as can be determined.  Limited historian  The patient's Past Medical, Family and Social History were reviewed and are on file in the EMR. Saddle pulmonary embolism November 2017  He was admitted with mental status change from urosepsis in both March and May 2019.  No prior colonoscopy   Objective:  Med list reviewed  Current Outpatient Medications:  .  acetaminophen (TYLENOL) 500 MG tablet, Take 1,000 mg by mouth daily as needed for mild pain, moderate pain or headache. , Disp: , Rfl:  .  atorvastatin (LIPITOR) 40 MG tablet, Take 40 mg by mouth at bedtime. , Disp: , Rfl:  .  cetirizine (ZYRTEC) 10 MG  tablet, Take 10 mg by mouth daily., Disp: , Rfl:  .  cholecalciferol (VITAMIN D) 1000 units tablet, Take 1,000 Units by mouth 2 (two) times daily., Disp: , Rfl:  .  diltiazem (CARDIZEM SR) 60 MG 12 hr capsule, Take 60 mg by mouth 2 (two) times daily., Disp: , Rfl:  .  ferrous sulfate 325 (65 FE) MG tablet, Take 325 mg by mouth daily with breakfast., Disp: , Rfl:  .  furosemide (LASIX) 20 MG tablet, Take 1 tablet (20 mg total) by mouth daily., Disp: , Rfl:  .  gabapentin (NEURONTIN) 100 MG capsule, Take 1-2 capsules (100-200 mg total) by mouth See admin instructions. 100mg  in the morning and at noon and 200mg  at bedtime, Disp: , Rfl:  .  glimepiride (AMARYL) 4 MG tablet, Take 4 mg by mouth 2 (two) times daily. , Disp: , Rfl:  .  guaifenesin (HUMIBID E) 400 MG TABS tablet, Take 400 mg by mouth every 6 (six) hours as needed (for cough and congestion)., Disp: , Rfl:  .  lisinopril (PRINIVIL,ZESTRIL) 20 MG tablet, Take 20 mg by mouth daily., Disp: , Rfl:  .  memantine (NAMENDA) 5 MG tablet, Take 1 tablet (5 mg total) by mouth daily., Disp: 30 tablet, Rfl: 0 .  Multiple Vitamin (MULTIVITAMIN WITH MINERALS) TABS tablet, Take 1 tablet by mouth daily., Disp: , Rfl:  .  omeprazole (PRILOSEC) 20 MG capsule, Take  40 mg by mouth daily., Disp: , Rfl:  .  potassium chloride (K-DUR) 10 MEQ tablet, Take 10 mEq by mouth daily., Disp: , Rfl:  .  ranitidine (ZANTAC) 150 MG tablet, Take 150 mg by mouth at bedtime., Disp: , Rfl:  .  rivaroxaban (XARELTO) 20 MG TABS tablet, Take 1 tablet (20 mg total) by mouth daily., Disp: , Rfl:  .  sertraline (ZOLOFT) 50 MG tablet, Take 50 mg by mouth daily., Disp: , Rfl:  .  traMADol (ULTRAM) 50 MG tablet, Take 50 mg by mouth every 8 (eight) hours as needed for moderate pain., Disp: , Rfl:  .  traZODone (DESYREL) 50 MG tablet, Take 50 mg by mouth at bedtime., Disp: , Rfl:  .  VICTOZA 18 MG/3ML SOPN, Inject 0.6 mg into the skin daily., Disp: , Rfl:  .  vitamin E 400 UNIT capsule,  Take 400 Units by mouth daily., Disp: , Rfl:    Vital signs in last 24 hrs: Vitals:   08/27/18 1537  BP: 108/70  Pulse: 84    Physical Exam  Somewhat feeble elderly man, gets on exam table slowly but without assistance.  Has normal vocal quality and is in no respiratory distress.  HEENT: sclera anicteric, oral mucosa moist without lesions  Neck: supple, no thyromegaly, JVD or lymphadenopathy  Cardiac: RRR without murmurs, S1S2 heard, no peripheral edema  Pulm: clear to auscultation bilaterally, normal RR and effort noted  Abdomen: soft, scattered and distractible tenderness, with active bowel sounds. No guarding or palpable hepatosplenomegaly.  Skin; warm and dry, no jaundice or rash   @ASSESSMENTPLANBEGIN @ Assessment: Encounter Diagnoses  Name Primary?  . Achalasia Yes  . Esophageal dysphagia   . Chronic anticoagulation    He has achalasia, symptoms have been slowly worsening and he has been resistant follow-up according to his wife.  He needs repeat upper endoscopy with Botox.  He will have to be off OAC 2 days prior.  He and his wife understand there is a small but real risk of pulmonary embolism during that time.  We were able to do so for his last procedure without difficulty.  Is an increased risk procedure due to his medical conditions, it must be done at the hospital.  The benefits and risks of the planned procedure were described in detail with the patient or (when appropriate) their health care proxy.  Risks were outlined as including, but not limited to, bleeding, infection, perforation, adverse medication reaction leading to cardiac or pulmonary decompensation, or pancreatitis (if ERCP).  The limitation of incomplete mucosal visualization was also discussed.  No guarantees or warranties were given.   I do not know how to comment on his anemia without further information from primary care.  We will ask for some labs in the way of CBC, CMP, and iron  levels.    Total time 40 minutes, over half spent face-to-face with patient in counseling and coordination of care.   Charlie PitterHenry L Danis III   CC: Dr. Michel SanteeJason Van [facility]

## 2018-08-27 NOTE — Patient Instructions (Addendum)
If you are age 78 or older, your body mass index should be between 23-30. Your Body mass index is 38.59 kg/m. If this is out of the aforementioned range listed, please consider follow up with your Primary Care Provider.  If you are age 95 or younger, your body mass index should be between 19-25. Your Body mass index is 38.59 kg/m. If this is out of the aformentioned range listed, please consider follow up with your Primary Care Provider.   You have been scheduled for an endoscopy. Please follow written instructions given to you at your visit today. If you use inhalers (even only as needed), please bring them with you on the day of your procedure. Your physician has requested that you go to www.startemmi.com and enter the access code given to you at your visit today. This web site gives a general overview about your procedure. However, you should still follow specific instructions given to you by our office regarding your preparation for the procedure.  It was a pleasure to see you today!  Dr. Myrtie Neither

## 2018-08-28 ENCOUNTER — Other Ambulatory Visit: Payer: Self-pay

## 2018-08-28 ENCOUNTER — Telehealth: Payer: Self-pay

## 2018-08-28 DIAGNOSIS — R1310 Dysphagia, unspecified: Secondary | ICD-10-CM

## 2018-08-28 DIAGNOSIS — K220 Achalasia of cardia: Secondary | ICD-10-CM

## 2018-08-28 NOTE — Telephone Encounter (Signed)
-----   Message from Charlie Pitter III, MD sent at 08/27/2018  5:50 PM EST ----- On this patient for the EGD/Botox, I forgot to give the instruction for him to be on a clear liquid diet starting with supper the evening prior to the procedure.  I want to be sure there is no residual food in the esophagus to the achalasia.  Please call his wife to give her that instruction.

## 2018-08-28 NOTE — Telephone Encounter (Signed)
Left message for a return call

## 2018-08-28 NOTE — Telephone Encounter (Signed)
Notified the patients wife Dois Davenport) she states clear understanding on the clear liquid supper.

## 2018-09-06 ENCOUNTER — Encounter (HOSPITAL_COMMUNITY): Payer: Self-pay | Admitting: Anesthesiology

## 2018-09-06 NOTE — Anesthesia Preprocedure Evaluation (Addendum)
Anesthesia Evaluation  Patient identified by MRN, date of birth, ID band Patient awake    Reviewed: Allergy & Precautions, NPO status , Patient's Chart, lab work & pertinent test results  Airway Mallampati: III  TM Distance: >3 FB Neck ROM: Full    Dental  (+) Teeth Intact, Dental Advisory Given   Pulmonary former smoker,     + decreased breath sounds      Cardiovascular hypertension, Pt. on medications  Rhythm:Regular Rate:Normal     Neuro/Psych  Headaches, Anxiety Depression Dementia  Neuromuscular disease    GI/Hepatic Neg liver ROS, hiatal hernia, GERD  Medicated,  Endo/Other  diabetes, Type 2, Oral Hypoglycemic AgentsHyperthyroidism   Renal/GU negative Renal ROS     Musculoskeletal  (+) Arthritis , Osteoarthritis,    Abdominal (+) + obese,   Peds  Hematology   Anesthesia Other Findings   Reproductive/Obstetrics                            Anesthesia Physical Anesthesia Plan  ASA: III  Anesthesia Plan: MAC   Post-op Pain Management:    Induction: Intravenous  PONV Risk Score and Plan: 0 and Propofol infusion  Airway Management Planned: Natural Airway and Nasal Cannula  Additional Equipment:   Intra-op Plan:   Post-operative Plan:   Informed Consent: I have reviewed the patients History and Physical, chart, labs and discussed the procedure including the risks, benefits and alternatives for the proposed anesthesia with the patient or authorized representative who has indicated his/her understanding and acceptance.     Dental advisory given  Plan Discussed with: CRNA  Anesthesia Plan Comments:        Anesthesia Quick Evaluation

## 2018-09-07 ENCOUNTER — Ambulatory Visit (HOSPITAL_COMMUNITY): Payer: Medicare HMO | Admitting: Certified Registered Nurse Anesthetist

## 2018-09-07 ENCOUNTER — Other Ambulatory Visit: Payer: Self-pay

## 2018-09-07 ENCOUNTER — Encounter (HOSPITAL_COMMUNITY)
Admission: RE | Disposition: A | Discharge status: Home / Self Care | Payer: Self-pay | Source: Home / Self Care | Attending: Gastroenterology

## 2018-09-07 ENCOUNTER — Encounter (HOSPITAL_COMMUNITY): Payer: Self-pay | Admitting: *Deleted

## 2018-09-07 ENCOUNTER — Ambulatory Visit (HOSPITAL_COMMUNITY)
Admission: RE | Admit: 2018-09-07 | Discharge: 2018-09-07 | Disposition: A | Discharge status: Home / Self Care | Payer: Medicare HMO | Attending: Gastroenterology | Admitting: Gastroenterology

## 2018-09-07 DIAGNOSIS — Z886 Allergy status to analgesic agent status: Secondary | ICD-10-CM | POA: Diagnosis not present

## 2018-09-07 DIAGNOSIS — M069 Rheumatoid arthritis, unspecified: Secondary | ICD-10-CM | POA: Diagnosis not present

## 2018-09-07 DIAGNOSIS — E559 Vitamin D deficiency, unspecified: Secondary | ICD-10-CM | POA: Insufficient documentation

## 2018-09-07 DIAGNOSIS — Z96651 Presence of right artificial knee joint: Secondary | ICD-10-CM | POA: Insufficient documentation

## 2018-09-07 DIAGNOSIS — E669 Obesity, unspecified: Secondary | ICD-10-CM | POA: Insufficient documentation

## 2018-09-07 DIAGNOSIS — Z79899 Other long term (current) drug therapy: Secondary | ICD-10-CM | POA: Diagnosis not present

## 2018-09-07 DIAGNOSIS — Z9841 Cataract extraction status, right eye: Secondary | ICD-10-CM | POA: Diagnosis not present

## 2018-09-07 DIAGNOSIS — Z87891 Personal history of nicotine dependence: Secondary | ICD-10-CM | POA: Insufficient documentation

## 2018-09-07 DIAGNOSIS — K219 Gastro-esophageal reflux disease without esophagitis: Secondary | ICD-10-CM | POA: Diagnosis not present

## 2018-09-07 DIAGNOSIS — K228 Other specified diseases of esophagus: Secondary | ICD-10-CM | POA: Diagnosis not present

## 2018-09-07 DIAGNOSIS — Z888 Allergy status to other drugs, medicaments and biological substances status: Secondary | ICD-10-CM | POA: Insufficient documentation

## 2018-09-07 DIAGNOSIS — K220 Achalasia of cardia: Secondary | ICD-10-CM | POA: Insufficient documentation

## 2018-09-07 DIAGNOSIS — E0590 Thyrotoxicosis, unspecified without thyrotoxic crisis or storm: Secondary | ICD-10-CM | POA: Insufficient documentation

## 2018-09-07 DIAGNOSIS — K529 Noninfective gastroenteritis and colitis, unspecified: Secondary | ICD-10-CM | POA: Diagnosis not present

## 2018-09-07 DIAGNOSIS — R51 Headache: Secondary | ICD-10-CM | POA: Insufficient documentation

## 2018-09-07 DIAGNOSIS — Z86711 Personal history of pulmonary embolism: Secondary | ICD-10-CM | POA: Diagnosis not present

## 2018-09-07 DIAGNOSIS — E785 Hyperlipidemia, unspecified: Secondary | ICD-10-CM | POA: Insufficient documentation

## 2018-09-07 DIAGNOSIS — E1140 Type 2 diabetes mellitus with diabetic neuropathy, unspecified: Secondary | ICD-10-CM | POA: Insufficient documentation

## 2018-09-07 DIAGNOSIS — Z9842 Cataract extraction status, left eye: Secondary | ICD-10-CM | POA: Insufficient documentation

## 2018-09-07 DIAGNOSIS — N401 Enlarged prostate with lower urinary tract symptoms: Secondary | ICD-10-CM | POA: Diagnosis not present

## 2018-09-07 DIAGNOSIS — R1310 Dysphagia, unspecified: Secondary | ICD-10-CM

## 2018-09-07 DIAGNOSIS — K449 Diaphragmatic hernia without obstruction or gangrene: Secondary | ICD-10-CM | POA: Diagnosis not present

## 2018-09-07 DIAGNOSIS — I10 Essential (primary) hypertension: Secondary | ICD-10-CM | POA: Diagnosis not present

## 2018-09-07 DIAGNOSIS — H9192 Unspecified hearing loss, left ear: Secondary | ICD-10-CM | POA: Diagnosis not present

## 2018-09-07 DIAGNOSIS — F329 Major depressive disorder, single episode, unspecified: Secondary | ICD-10-CM | POA: Insufficient documentation

## 2018-09-07 DIAGNOSIS — F419 Anxiety disorder, unspecified: Secondary | ICD-10-CM | POA: Insufficient documentation

## 2018-09-07 HISTORY — PX: ESOPHAGOGASTRODUODENOSCOPY (EGD) WITH PROPOFOL: SHX5813

## 2018-09-07 HISTORY — PX: BOTOX INJECTION: SHX5754

## 2018-09-07 LAB — GLUCOSE, CAPILLARY
GLUCOSE-CAPILLARY: 62 mg/dL — AB (ref 70–99)
GLUCOSE-CAPILLARY: 102 mg/dL — AB (ref 70–99)

## 2018-09-07 SURGERY — ESOPHAGOGASTRODUODENOSCOPY (EGD) WITH PROPOFOL
Anesthesia: Monitor Anesthesia Care

## 2018-09-07 MED ORDER — PROPOFOL 500 MG/50ML IV EMUL
INTRAVENOUS | Status: DC | PRN
  Administered 2018-09-07: 75 ug/kg/min via INTRAVENOUS

## 2018-09-07 MED ORDER — PROPOFOL 10 MG/ML IV BOLUS
INTRAVENOUS | Status: AC
  Filled 2018-09-07: qty 60

## 2018-09-07 MED ORDER — DEXTROSE 50 % IV SOLN
25.0000 mL | Freq: Once | INTRAVENOUS | Status: AC
  Administered 2018-09-07: 25 mL via INTRAVENOUS

## 2018-09-07 MED ORDER — PROPOFOL 10 MG/ML IV BOLUS
INTRAVENOUS | Status: DC | PRN
  Administered 2018-09-07: 20 mg via INTRAVENOUS
  Administered 2018-09-07 (×3): 10 mg via INTRAVENOUS

## 2018-09-07 MED ORDER — SODIUM CHLORIDE 0.9 % IV SOLN: INTRAVENOUS | Status: DC

## 2018-09-07 MED ORDER — SODIUM CHLORIDE (PF) 0.9 % IJ SOLN
INTRAMUSCULAR | Status: DC | PRN
  Administered 2018-09-07: 4 mL via SUBMUCOSAL

## 2018-09-07 MED ORDER — ONABOTULINUMTOXINA 100 UNITS IJ SOLR
INTRAMUSCULAR | Status: AC
  Filled 2018-09-07: qty 100

## 2018-09-07 MED ORDER — LACTATED RINGERS IV SOLN
INTRAVENOUS | Status: DC
  Administered 2018-09-07: 08:00:00 via INTRAVENOUS

## 2018-09-07 MED ORDER — DEXTROSE 50 % IV SOLN
INTRAVENOUS | Status: AC
  Filled 2018-09-07: qty 50

## 2018-09-07 SURGICAL SUPPLY — 15 items

## 2018-09-07 NOTE — H&P (Signed)
History:  This patient presents for endoscopic testing for esophageal dysphagia (see 08/27/18 office note).  [name] Referring physician: Crist Fat, MD  Past Medical History: Past Medical History:  Diagnosis Date  . Acute saddle pulmonary embolism (HCC) 05/20/2016  . Anxiety   . Bilateral swelling of feet   . BPH (benign prostatic hyperplasia)    no meds needed per medical md  . Cataracts, bilateral    immature  . Chronic diarrhea   . Deafness    in left ear   . Depression    takes Zoloft daily  . Diabetes mellitus    takes Actos and Amaryl daily as well as Onglyza  . Dysphagia   . Fatigue   . GERD (gastroesophageal reflux disease)    takes Omeprazole daily  . Headaches, cluster   . Hiatal hernia   . Hyperlipemia    was on meds but hasnt been on anything in about 58months per MD  . Hypertension    takes Amlodipine,HCTZ,and Lisinopril daily  . Hyperthyroidism   . Insomnia    takes Trazodone nightly  . Nocturia   . Osteoarthritis   . Peripheral neuropathy    takes Gabapentin daily  . Pneumonia 3+ yrs ago  . Presbyesophagus   . Rheumatoid arthritis(714.0)   . Vitamin D deficiency    takes Vit D daily     Past Surgical History: Past Surgical History:  Procedure Laterality Date  . BACK SURGERY  1998  . BOTOX INJECTION N/A 11/11/2016   Procedure: BOTOX INJECTION;  Surgeon: Sherrilyn Rist, MD;  Location: [facility];  Service: Gastroenterology;  Laterality: N/A;  . CATARACT EXTRACTION, BILATERAL  2018  . COLONOSCOPY WITH ESOPHAGOGASTRODUODENOSCOPY (EGD) AND ESOPHAGEAL DILATION (ED)    . ESOPHAGEAL MANOMETRY N/A 09/14/2016   Procedure: ESOPHAGEAL MANOMETRY (EM);  Surgeon: Ruffin Frederick, MD;  Location: [facility];  Service: Gastroenterology;  Laterality: N/A;  . ESOPHAGOGASTRODUODENOSCOPY N/A 11/11/2016   Procedure: ESOPHAGOGASTRODUODENOSCOPY (EGD);  Surgeon: Sherrilyn Rist, MD;  Location: [facility];  Service: Gastroenterology;   Laterality: N/A;  . ESOPHAGOGASTRODUODENOSCOPY (EGD) WITH PROPOFOL N/A 09/15/2016   Procedure: ESOPHAGOGASTRODUODENOSCOPY (EGD) WITH PROPOFOL;  Surgeon: Ruffin Frederick, MD;  Location: [facility];  Service: Gastroenterology;  Laterality: N/A;  . IRRIGATION AND DEBRIDEMENT KNEE Right 01/12/2015  . TOTAL KNEE ARTHROPLASTY Right 01/06/2015   Procedure: TOTAL KNEE ARTHROPLASTY;  Surgeon: Marcene Corning, MD;  Location: [facility] OR;  Service: Orthopedics;  Laterality: Right;    Allergies: Allergies  Allergen Reactions  . Donepezil Diarrhea  . Aspirin Diarrhea    Outpatient Meds: Current Facility-Administered Medications  Medication Dose Route Frequency Provider Last Rate Last Dose  . 0.9 %  sodium chloride infusion   Intravenous Continuous Danis, Starr Lake III, MD      . lactated ringers infusion   Intravenous Continuous Danis, Starr Lake III, MD          ___________________________________________________________________ Objective   Exam:  There were no vitals taken for this visit.   CV: RRR without murmur, S1/S2, no JVD, no peripheral edema  Resp: clear to auscultation bilaterally, normal RR and effort noted  GI: soft, no tenderness, with active bowel sounds. No guarding or palpable organomegaly noted.  Neuro: awake, alert and oriented x 3. Normal gross motor function and fluent speech   Assessment:  Achalasia and dysphagia  Plan:  EGD with Botox.  Patient has been off OAC 2 days.   Andrew Winters

## 2018-09-07 NOTE — Interval H&P Note (Signed)
History and Physical Interval Note:  09/07/2018 7:33 AM  Andrew Winters  has presented today for surgery, with the diagnosis of Dysphagia, Acalasia tt  The various methods of treatment have been discussed with the patient and family. After consideration of risks, benefits and other options for treatment, the patient has consented to  Procedure(s): ESOPHAGOGASTRODUODENOSCOPY (EGD) WITH PROPOFOL (N/A) BOTOX INJECTION (N/A) as a surgical intervention .  The patient's history has been reviewed, patient examined, no change in status, stable for surgery.  I have reviewed the patient's chart and labs.  Questions were answered to the patient's satisfaction.     Charlie Pitter III

## 2018-09-07 NOTE — Transfer of Care (Signed)
Immediate Anesthesia Transfer of Care Note  Patient: Andrew Winters  Procedure(s) Performed: ESOPHAGOGASTRODUODENOSCOPY (EGD) WITH PROPOFOL (N/A ) BOTOX INJECTION (N/A )  Patient Location: Endoscopy Unit  Anesthesia Type:MAC  Level of Consciousness: drowsy and patient cooperative  Airway & Oxygen Therapy: Patient Spontanous Breathing and Patient connected to nasal cannula oxygen  Post-op Assessment: Report given to RN and Post -op Vital signs reviewed and stable  Post vital signs: Reviewed and stable  Last Vitals:  Vitals Value Taken Time  BP    Temp    Pulse 72 09/07/2018  8:22 AM  Resp 24 09/07/2018  8:22 AM  SpO2 99 % 09/07/2018  8:22 AM  Vitals shown include unvalidated device data.  Last Pain:  Vitals:   09/07/18 0755  PainSc: 0-No pain         Complications: No apparent anesthesia complications

## 2018-09-07 NOTE — Anesthesia Postprocedure Evaluation (Signed)
Anesthesia Post Note  Patient: [name]  Procedure(s) Performed: ESOPHAGOGASTRODUODENOSCOPY (EGD) WITH PROPOFOL (N/A ) BOTOX INJECTION (N/A )     Patient location during evaluation: PACU Anesthesia Type: MAC Level of consciousness: awake and alert Pain management: pain level controlled Vital Signs Assessment: post-procedure vital signs reviewed and stable Respiratory status: spontaneous breathing, nonlabored ventilation, respiratory function stable and patient connected to nasal cannula oxygen Cardiovascular status: stable and blood pressure returned to baseline Postop Assessment: no apparent nausea or vomiting Anesthetic complications: no    Last Vitals:  Vitals:   09/07/18 0823 09/07/18 0838  BP: (!) 124/59 110/65  Pulse: 71 71  Resp: 19 18  Temp: 36.4 C   SpO2: 99% 100%    Last Pain:  Vitals:   09/07/18 0838  TempSrc:   PainSc: 0-No pain                 Effie Berkshire

## 2018-09-07 NOTE — Discharge Instructions (Signed)
YOU HAD AN ENDOSCOPIC PROCEDURE TODAY: Refer to the procedure report and other information in the discharge instructions given to you for any specific questions about what was found during the examination. If this information does not answer your questions, please call [place] office at [phone] to clarify.   YOU SHOULD EXPECT: Some feelings of bloating in the abdomen. Passage of more gas than usual. Walking can help get rid of the air that was put into your GI tract during the procedure and reduce the bloating. If you had a lower endoscopy (such as a colonoscopy or flexible sigmoidoscopy) you may notice spotting of blood in your stool or on the toilet paper. Some abdominal soreness may be present for a day or two, also.  DIET: Your first meal following the procedure should be a light meal and then it is ok to progress to your normal diet. A half-sandwich or bowl of soup is an example of a good first meal. Heavy or fried foods are harder to digest and may make you feel nauseous or bloated. Drink plenty of fluids but you should avoid alcoholic beverages for 24 hours. If you had a esophageal dilation, please see attached instructions for diet.    ACTIVITY: Your care partner should take you home directly after the procedure. You should plan to take it easy, moving slowly for the rest of the day. You can resume normal activity the day after the procedure however YOU SHOULD NOT DRIVE, use power tools, machinery or perform tasks that involve climbing or major physical exertion for 24 hours (because of the sedation medicines used during the test).   SYMPTOMS TO REPORT IMMEDIATELY: A gastroenterologist can be reached at any hour. Please call [phone]  for any of the following symptoms:   Following upper endoscopy (EGD, EUS, ERCP, esophageal dilation) Vomiting of blood or coffee ground material  New, significant abdominal pain  New, significant chest pain or pain under the shoulder blades  Painful or  persistently difficult swallowing  New shortness of breath  Black, tarry-looking or red, bloody stools  FOLLOW UP:  If any biopsies were taken you will be contacted by phone or by letter within the next 1-3 weeks. Call [phone]  if you have not heard about the biopsies in 3 weeks.  Please also call with any specific questions about appointments or follow up tests.  

## 2018-09-07 NOTE — Op Note (Signed)
[facility] Patient Name: Andrew Winters Procedure Date: 09/07/2018 MRN: [id number] Attending MD: Starr Lake. Myrtie Neither , MD Date of Birth: 27-Feb-1941 CSN: [id number] Age: 78 Admit Type: Outpatient Procedure:                Upper GI endoscopy Indications:              Dysphagia, For botulinum toxin injection of                            achalasia Providers:                Sherilyn Cooter L. Myrtie Neither, MD, Margaree Mackintosh, RN,                            Beryle Beams, Technician, Milinda Cave, CRNA Referring MD:             Wilma Flavin, MD Medicines:                Monitored Anesthesia Care Complications:            No immediate complications. Estimated Blood Loss:     Estimated blood loss: none. Procedure:                Pre-Anesthesia Assessment:                           - Prior to the procedure, a History and Physical                            was performed, and patient medications and                            allergies were reviewed. The patient's tolerance of                            previous anesthesia was also reviewed. The risks                            and benefits of the procedure and the sedation                            options and risks were discussed with the patient.                            All questions were answered, and informed consent                            was obtained. Prior Anticoagulants: The patient has                            taken Xarelto (rivaroxaban), last dose was 2 days                            prior to procedure. ASA Grade Assessment: III - A  patient with severe systemic disease. After                            reviewing the risks and benefits, the patient was                            deemed in satisfactory condition to undergo the                            procedure.                           After obtaining informed consent, the endoscope was                            passed under direct  vision. Throughout the                            procedure, the patient's blood pressure, pulse, and                            oxygen saturations were monitored continuously. The                            GIF-H190 ([id number]) Olympus gastroscope was                            introduced through the mouth, and advanced to the                            second part of duodenum. The upper GI endoscopy was                            accomplished without difficulty. The patient                            tolerated the procedure well. Scope In: Scope Out: Findings:      The lumen of the upper third of the esophagus was moderately dilated.      A hypertonic lower esophageal sphincter was found. There was mild       resistance to endoscope advancement into the stomach. Esophageal       motility was visualized. The Z-line was regular. The gastroesophageal       junction and cardia were normal on retroflexed view. Area was       successfully injected with 100 units botulinum toxin in a 4-quadrant       fashion.      A small amount of food (residue) was found in the entire examined       stomach.      The cardia and gastric fundus were normal on retroflexion.      The examined duodenum was normal. Impression:               - Dilation in the upper third of the esophagus.                           -  Achalasia. Injected with botulinum toxin.                           - A small amount of food (residue) in the stomach.                           - Normal examined duodenum.                           - No specimens collected. Moderate Sedation:      Not Applicable - Patient had care per Anesthesia. Recommendation:           - Patient has a contact number available for                            emergencies. The signs and symptoms of potential                            delayed complications were discussed with the                            patient. Return to normal activities tomorrow.                             Written discharge instructions were provided to the                            patient.                           - Resume previous diet.                           - Resume Xarelto (rivaroxaban) at prior dose today. Procedure Code(s):        --- Professional ---                           [phone], Esophagogastroduodenoscopy, flexible,                            transoral; with directed submucosal injection(s),                            any substance Diagnosis Code(s):        --- Professional ---                           K22.8, Other specified diseases of esophagus                           K22.0, Achalasia of cardia                           R13.10, Dysphagia, unspecified CPT copyright 2018 [employer]. All rights reserved. The codes documented in this report are preliminary and upon coder review may  be revised to meet current compliance requirements. Henry L. Danis,  MD 09/07/2018 8:20:43 AM This report has been signed electronically. Number of Addenda: 0

## 2018-09-07 NOTE — Progress Notes (Signed)
81- on admission CBG found to be -62. Dr. Hart [place] aware. After PIV obtained 25cc dextrose 50% given IV. Pt denies symptoms.   [id number]  Will continue to assess.

## 2018-09-10 ENCOUNTER — Encounter (HOSPITAL_COMMUNITY): Payer: Self-pay | Admitting: Gastroenterology

## 2018-11-14 DIAGNOSIS — G309 Alzheimer's disease, unspecified: Secondary | ICD-10-CM | POA: Diagnosis not present

## 2018-11-14 DIAGNOSIS — R197 Diarrhea, unspecified: Secondary | ICD-10-CM | POA: Diagnosis not present

## 2018-11-14 DIAGNOSIS — F330 Major depressive disorder, recurrent, mild: Secondary | ICD-10-CM | POA: Diagnosis not present

## 2019-02-25 DIAGNOSIS — E119 Type 2 diabetes mellitus without complications: Secondary | ICD-10-CM | POA: Diagnosis not present

## 2019-02-25 DIAGNOSIS — I10 Essential (primary) hypertension: Secondary | ICD-10-CM | POA: Diagnosis not present

## 2019-05-01 DIAGNOSIS — I10 Essential (primary) hypertension: Secondary | ICD-10-CM | POA: Diagnosis not present

## 2019-05-01 DIAGNOSIS — G894 Chronic pain syndrome: Secondary | ICD-10-CM | POA: Diagnosis not present

## 2019-05-01 DIAGNOSIS — Z23 Encounter for immunization: Secondary | ICD-10-CM | POA: Diagnosis not present

## 2019-05-31 DIAGNOSIS — E1140 Type 2 diabetes mellitus with diabetic neuropathy, unspecified: Secondary | ICD-10-CM | POA: Diagnosis not present

## 2019-05-31 DIAGNOSIS — M8949 Other hypertrophic osteoarthropathy, multiple sites: Secondary | ICD-10-CM | POA: Diagnosis not present

## 2019-05-31 DIAGNOSIS — E7800 Pure hypercholesterolemia, unspecified: Secondary | ICD-10-CM | POA: Diagnosis not present

## 2019-05-31 DIAGNOSIS — E119 Type 2 diabetes mellitus without complications: Secondary | ICD-10-CM | POA: Diagnosis not present

## 2019-05-31 DIAGNOSIS — N400 Enlarged prostate without lower urinary tract symptoms: Secondary | ICD-10-CM | POA: Diagnosis not present

## 2019-05-31 DIAGNOSIS — G309 Alzheimer's disease, unspecified: Secondary | ICD-10-CM | POA: Diagnosis not present

## 2019-05-31 DIAGNOSIS — F0280 Dementia in other diseases classified elsewhere without behavioral disturbance: Secondary | ICD-10-CM | POA: Diagnosis not present

## 2019-05-31 DIAGNOSIS — Z86711 Personal history of pulmonary embolism: Secondary | ICD-10-CM | POA: Diagnosis not present

## 2019-05-31 DIAGNOSIS — I10 Essential (primary) hypertension: Secondary | ICD-10-CM | POA: Diagnosis not present

## 2019-05-31 DIAGNOSIS — Z0000 Encounter for general adult medical examination without abnormal findings: Secondary | ICD-10-CM | POA: Diagnosis not present

## 2019-08-02 ENCOUNTER — Ambulatory Visit: Payer: Medicare Other | Admitting: Sports Medicine

## 2019-08-05 ENCOUNTER — Ambulatory Visit: Payer: Medicare HMO | Admitting: Gastroenterology

## 2019-08-16 ENCOUNTER — Ambulatory Visit: Payer: Medicare HMO | Admitting: Sports Medicine

## 2019-08-30 ENCOUNTER — Ambulatory Visit: Payer: Medicare HMO | Admitting: Sports Medicine

## 2019-09-04 ENCOUNTER — Ambulatory Visit: Payer: Medicare HMO | Admitting: Gastroenterology

## 2019-10-02 DIAGNOSIS — R972 Elevated prostate specific antigen [PSA]: Secondary | ICD-10-CM | POA: Diagnosis not present

## 2019-10-02 DIAGNOSIS — R351 Nocturia: Secondary | ICD-10-CM | POA: Diagnosis not present

## 2019-10-02 DIAGNOSIS — N401 Enlarged prostate with lower urinary tract symptoms: Secondary | ICD-10-CM | POA: Diagnosis not present

## 2019-10-04 ENCOUNTER — Ambulatory Visit: Payer: Medicare HMO | Admitting: Gastroenterology

## 2019-10-04 ENCOUNTER — Encounter: Payer: Self-pay | Admitting: Gastroenterology

## 2019-10-04 VITALS — BP 134/70 | HR 74 | Temp 97.1°F | Ht 69.5 in | Wt 281.0 lb

## 2019-10-04 DIAGNOSIS — R1319 Other dysphagia: Secondary | ICD-10-CM

## 2019-10-04 DIAGNOSIS — Z7901 Long term (current) use of anticoagulants: Secondary | ICD-10-CM

## 2019-10-04 DIAGNOSIS — R1310 Dysphagia, unspecified: Secondary | ICD-10-CM | POA: Diagnosis not present

## 2019-10-04 DIAGNOSIS — K220 Achalasia of cardia: Secondary | ICD-10-CM

## 2019-10-04 NOTE — Progress Notes (Signed)
GI Progress Note  Chief Complaint: Achalasia  Subjective  History: Last seen February 2020 in office for achalasia.  Limited historian due to his dementia, but at that time his wife said he still had symptoms.  He may have had a few months of relief from Botox in May 2018. EGD 09/07/2018 found hypertonic LES with moderate resistance to scope passing, consistent with his known diagnosis of achalasia.  100 units Botox injected.  Mr. Leiker is here with his wife.  As before, it is difficult to gauge his symptoms.  He is a limited historian, at least in part from his dementia.  His wife feels that the last injection gave a longer period of relief than the one prior, perhaps about 6 months.  In the last few months he has been periodically noting food feeling hung up in the chest.  She sometimes thinks his medicines might feel stuck as well.  His appetite is remained good, his weight stable, he has not complained of nausea or vomiting. Sometimes he has a vague upper abdominal discomfort that seems to come and go.  ROS: Cardiovascular:  no chest pain Respiratory: no dyspnea Memory difficulty Arthralgias The patient's Past Medical, Family and Social History were reviewed and are on file in the EMR.  Objective:  Med list reviewed  Current Outpatient Medications:  .  acetaminophen (TYLENOL) 500 MG tablet, Take 1,000 mg by mouth daily as needed for mild pain, moderate pain or headache. , Disp: , Rfl:  .  atorvastatin (LIPITOR) 40 MG tablet, Take 40 mg by mouth at bedtime. , Disp: , Rfl:  .  bismuth subsalicylate (PEPTO BISMOL) 262 MG/15ML suspension, Take 30 mLs by mouth every 6 (six) hours as needed for indigestion or diarrhea or loose stools., Disp: , Rfl:  .  cetirizine (ZYRTEC) 10 MG tablet, Take 10 mg by mouth daily., Disp: , Rfl:  .  cholecalciferol (VITAMIN D) 1000 units tablet, Take 1,000 Units by mouth 2 (two) times daily., Disp: , Rfl:  .  diltiazem (CARDIZEM) 60 MG  tablet, Take 60 mg by mouth 2 (two) times daily., Disp: , Rfl:  .  ferrous sulfate 325 (65 FE) MG tablet, Take 325 mg by mouth daily with breakfast., Disp: , Rfl:  .  furosemide (LASIX) 20 MG tablet, Take 1 tablet (20 mg total) by mouth daily., Disp: , Rfl:  .  gabapentin (NEURONTIN) 100 MG capsule, Take 1-2 capsules (100-200 mg total) by mouth See admin instructions. 100mg  in the morning and at noon and 200mg  at bedtime, Disp: , Rfl:  .  glimepiride (AMARYL) 4 MG tablet, Take 4 mg by mouth 2 (two) times daily. , Disp: , Rfl:  .  guaifenesin (HUMIBID E) 400 MG TABS tablet, Take 400 mg by mouth every 6 (six) hours as needed (for cough and congestion)., Disp: , Rfl:  .  lisinopril (PRINIVIL,ZESTRIL) 20 MG tablet, Take 20 mg by mouth daily., Disp: , Rfl:  .  loperamide (IMODIUM) 1 MG/5ML solution, Take 3 mg by mouth as needed for diarrhea or loose stools., Disp: , Rfl:  .  memantine (NAMENDA) 5 MG tablet, Take 1 tablet (5 mg total) by mouth daily., Disp: 30 tablet, Rfl: 0 .  Multiple Vitamin (MULTIVITAMIN WITH MINERALS) TABS tablet, Take 1 tablet by mouth daily., Disp: , Rfl:  .  omeprazole (PRILOSEC) 20 MG capsule, Take 40 mg by mouth daily., Disp: , Rfl:  .  Polyethyl Glycol-Propyl Glycol (SYSTANE OP), Place 1 drop into  both eyes daily as needed (dry eyes)., Disp: , Rfl:  .  potassium chloride (K-DUR) 10 MEQ tablet, Take 10 mEq by mouth daily., Disp: , Rfl:  .  rivaroxaban (XARELTO) 20 MG TABS tablet, Take 1 tablet (20 mg total) by mouth daily., Disp: , Rfl:  .  sertraline (ZOLOFT) 50 MG tablet, Take 50 mg by mouth daily., Disp: , Rfl:  .  traMADol (ULTRAM) 50 MG tablet, Take 50 mg by mouth every 8 (eight) hours as needed for moderate pain., Disp: , Rfl:  .  traZODone (DESYREL) 50 MG tablet, Take 50 mg by mouth at bedtime., Disp: , Rfl:  .  VICTOZA 18 MG/3ML SOPN, Inject 0.6 mg into the skin daily at 12 noon. , Disp: , Rfl:  .  vitamin E 400 UNIT capsule, Take 400 Units by mouth daily., Disp: , Rfl:     Vital signs in last 24 hrs: Vitals:   10/04/19 1530  BP: 134/70  Pulse: 74  Temp: (!) 97.1 F (36.2 C)    Physical Exam  Pleasant, somewhat restricted affect as before  HEENT: sclera anicteric, oral mucosa moist without lesions  Neck: supple, no thyromegaly, JVD or lymphadenopathy  Cardiac: RRR without murmurs, S1S2 heard, no peripheral edema  Pulm: clear to auscultation bilaterally, normal RR and effort noted  Abdomen: soft, no tenderness, with active bowel sounds.  Cannot assess mass or hepatosplenomegaly due to body habitus.  Limited exam seated, difficult for him to get on exam table.  Skin; warm and dry, no jaundice or rash  Labs:   ___________________________________________ Radiologic studies:   ____________________________________________ Other:   _____________________________________________ Assessment & Plan  Assessment: Encounter Diagnoses  Name Primary?  . Achalasia Yes  . Esophageal dysphagia   . Chronic anticoagulation    Achalasia with recurrent dysphagia.  He is not a surgical candidate due to his age and comorbidities.  It sounds like he needs a repeat upper endoscopy with LES Botox injection.  He has done well with that in the past, including holding his anticoagulation 2 days prior.  He has a history of pulmonary embolism, OAC is managed by his PCP. I have made the decision to hold the Xarelto 2 days prior, and his wife understands the small chance of pulmonary embolism in that short period of time off the medicine.  Plan: EGD with Botox injection.  Must be done in hospital endoscopy lab due to the patient's medical condition and nature of procedure.  The benefits and risks of the planned procedure were described in detail with the patient or (when appropriate) their health care proxy.  Risks were outlined as including, but not limited to, bleeding, infection, perforation, adverse medication reaction leading to cardiac or pulmonary  decompensation, pancreatitis (if ERCP).  The limitation of incomplete mucosal visualization was also discussed.  No guarantees or warranties were given.  Patient at increased risk for cardiopulmonary complications of procedure due to medical comorbidities.  30 minutes were spent on this encounter (including chart review, history/exam, counseling/coordination of care, and documentation)  Charlie Pitter III

## 2019-10-04 NOTE — Patient Instructions (Addendum)
If you are age 79 or older, your body mass index should be between 23-30. Your Body mass index is 40.9 kg/m. If this is out of the aforementioned range listed, please consider follow up with your Primary Care Provider.  If you are age 41 or younger, your body mass index should be between 19-25. Your Body mass index is 40.9 kg/m. If this is out of the aformentioned range listed, please consider follow up with your Primary Care Provider.   You have been scheduled for an endoscopy. Please follow written instructions given to you at your visit today. If you use inhalers (even only as needed), please bring them with you on the day of your procedure.  Per Dr Myrtie Neither hold your Xarelto 2 days prior to the EGD.  Due to recent COVID-19 restrictions implemented by our local and state authorities and in an effort to keep both patients and staff as safe as possible, our hospital system now requires COVID-19 testing prior to any scheduled hospital procedure. Please go to our [facility] location drive thru testing site ([address], [place], [place] [address]) on 10-15-2019 at  10:10 am . There will be multiple testing areas, the first checkpoint being for pre-procedure/surgery testing. Get into the right (yellow) lane that leads to the PAT testing team. You will not be billed at the time of testing but may receive a bill later depending on your insurance. The approximate cost of the test is $100. You must agree to quarantine from the time of your testing until the procedure date on 10-18-2019 . This should include staying at home with ONLY the people you live with. Avoid take-out, grocery store shopping or leaving the house for any non-emergent reason. Failure to have your COVID-19 test done on the date and time you have been scheduled will result in cancellation of procedure. Please call our office at [phone] if you have any questions.    It was a pleasure to see you today!  Dr. Myrtie Neither

## 2019-10-15 ENCOUNTER — Other Ambulatory Visit: Payer: Self-pay

## 2019-10-15 ENCOUNTER — Telehealth: Payer: Self-pay | Admitting: Gastroenterology

## 2019-10-15 ENCOUNTER — Other Ambulatory Visit (HOSPITAL_COMMUNITY)
Admission: RE | Admit: 2019-10-15 | Discharge: 2019-10-15 | Disposition: A | Discharge status: Home / Self Care | Payer: Medicare HMO | Source: Ambulatory Visit | Attending: Gastroenterology | Admitting: Gastroenterology

## 2019-10-15 DIAGNOSIS — R1310 Dysphagia, unspecified: Secondary | ICD-10-CM

## 2019-10-15 DIAGNOSIS — Z20822 Contact with and (suspected) exposure to covid-19: Secondary | ICD-10-CM | POA: Insufficient documentation

## 2019-10-15 DIAGNOSIS — Z01812 Encounter for preprocedural laboratory examination: Secondary | ICD-10-CM | POA: Diagnosis not present

## 2019-10-15 DIAGNOSIS — K220 Achalasia of cardia: Secondary | ICD-10-CM

## 2019-10-15 DIAGNOSIS — Z7901 Long term (current) use of anticoagulants: Secondary | ICD-10-CM

## 2019-10-15 DIAGNOSIS — R1319 Other dysphagia: Secondary | ICD-10-CM

## 2019-10-15 LAB — SARS CORONAVIRUS 2 (TAT 6-24 HRS): SARS Coronavirus 2: NEGATIVE

## 2019-10-15 NOTE — Telephone Encounter (Signed)
Medication management for DM medication Victoza and glimiperide along with reviewing directions on Xarelto.

## 2019-10-17 NOTE — Anesthesia Preprocedure Evaluation (Addendum)
Anesthesia Evaluation  Patient identified by MRN, date of birth, ID band Patient awake    Reviewed: Allergy & Precautions, NPO status , Patient's Chart, lab work & pertinent test results  Airway Mallampati: III  TM Distance: >3 FB Neck ROM: Full    Dental  (+) Teeth Intact, Dental Advisory Given   Pulmonary former smoker,     (-) decreased breath sounds      Cardiovascular hypertension, Pt. on medications  Rhythm:Regular Rate:Normal  ECHO 11/17 - Left ventricle: The cavity size was normal. There was mild  concentric hypertrophy. Systolic function was normal. The  estimated ejection fraction was in the range of 55% to 60%. Wall  motion was normal; there were no regional wall motion  abnormalities. Doppler parameters are consistent with abnormal  left ventricular relaxation (grade 1 diastolic dysfunction).  There was no evidence of elevated ventricular filling pressure by  Doppler parameters.    Neuro/Psych  Headaches, Anxiety Depression Dementia  Neuromuscular disease    GI/Hepatic Neg liver ROS, hiatal hernia, GERD  Medicated,  Endo/Other  diabetes, Type 2, Oral Hypoglycemic AgentsHyperthyroidism   Renal/GU negative Renal ROS     Musculoskeletal  (+) Arthritis , Osteoarthritis,    Abdominal (+) + obese,   Peds  Hematology  (+) Blood dyscrasia, anemia ,   Anesthesia Other Findings   Reproductive/Obstetrics                            Anesthesia Physical  Anesthesia Plan  ASA: III  Anesthesia Plan: MAC   Post-op Pain Management:    Induction: Intravenous  PONV Risk Score and Plan: 0 and Propofol infusion  Airway Management Planned: Natural Airway and Nasal Cannula  Additional Equipment:   Intra-op Plan:   Post-operative Plan:   Informed Consent: I have reviewed the patients History and Physical, chart, labs and discussed the procedure including the risks,  benefits and alternatives for the proposed anesthesia with the patient or authorized representative who has indicated his/her understanding and acceptance.     Dental advisory given  Plan Discussed with: CRNA and Anesthesiologist  Anesthesia Plan Comments:         Anesthesia Quick Evaluation

## 2019-10-18 ENCOUNTER — Encounter (HOSPITAL_COMMUNITY)
Admission: RE | Disposition: A | Discharge status: Home / Self Care | Payer: Self-pay | Source: Home / Self Care | Attending: Gastroenterology

## 2019-10-18 ENCOUNTER — Other Ambulatory Visit: Payer: Self-pay

## 2019-10-18 ENCOUNTER — Ambulatory Visit (HOSPITAL_COMMUNITY): Payer: Medicare HMO | Admitting: Certified Registered Nurse Anesthetist

## 2019-10-18 ENCOUNTER — Encounter (HOSPITAL_COMMUNITY): Payer: Self-pay | Admitting: Gastroenterology

## 2019-10-18 ENCOUNTER — Ambulatory Visit (HOSPITAL_COMMUNITY)
Admission: RE | Admit: 2019-10-18 | Discharge: 2019-10-18 | Disposition: A | Discharge status: Home / Self Care | Payer: Medicare HMO | Attending: Gastroenterology | Admitting: Gastroenterology

## 2019-10-18 DIAGNOSIS — F329 Major depressive disorder, single episode, unspecified: Secondary | ICD-10-CM | POA: Diagnosis not present

## 2019-10-18 DIAGNOSIS — K219 Gastro-esophageal reflux disease without esophagitis: Secondary | ICD-10-CM | POA: Insufficient documentation

## 2019-10-18 DIAGNOSIS — K220 Achalasia of cardia: Secondary | ICD-10-CM

## 2019-10-18 DIAGNOSIS — Z886 Allergy status to analgesic agent status: Secondary | ICD-10-CM | POA: Insufficient documentation

## 2019-10-18 DIAGNOSIS — Z87891 Personal history of nicotine dependence: Secondary | ICD-10-CM | POA: Diagnosis not present

## 2019-10-18 DIAGNOSIS — Z96651 Presence of right artificial knee joint: Secondary | ICD-10-CM | POA: Diagnosis not present

## 2019-10-18 DIAGNOSIS — E1136 Type 2 diabetes mellitus with diabetic cataract: Secondary | ICD-10-CM | POA: Diagnosis not present

## 2019-10-18 DIAGNOSIS — R1314 Dysphagia, pharyngoesophageal phase: Secondary | ICD-10-CM | POA: Diagnosis not present

## 2019-10-18 DIAGNOSIS — R1319 Other dysphagia: Secondary | ICD-10-CM

## 2019-10-18 DIAGNOSIS — M1990 Unspecified osteoarthritis, unspecified site: Secondary | ICD-10-CM | POA: Diagnosis not present

## 2019-10-18 DIAGNOSIS — Z86711 Personal history of pulmonary embolism: Secondary | ICD-10-CM | POA: Diagnosis not present

## 2019-10-18 DIAGNOSIS — M069 Rheumatoid arthritis, unspecified: Secondary | ICD-10-CM | POA: Diagnosis not present

## 2019-10-18 DIAGNOSIS — G4700 Insomnia, unspecified: Secondary | ICD-10-CM | POA: Diagnosis not present

## 2019-10-18 DIAGNOSIS — Z6841 Body Mass Index (BMI) 40.0 and over, adult: Secondary | ICD-10-CM | POA: Diagnosis not present

## 2019-10-18 DIAGNOSIS — E559 Vitamin D deficiency, unspecified: Secondary | ICD-10-CM | POA: Diagnosis not present

## 2019-10-18 DIAGNOSIS — I10 Essential (primary) hypertension: Secondary | ICD-10-CM | POA: Insufficient documentation

## 2019-10-18 DIAGNOSIS — Z7901 Long term (current) use of anticoagulants: Secondary | ICD-10-CM

## 2019-10-18 DIAGNOSIS — Z7984 Long term (current) use of oral hypoglycemic drugs: Secondary | ICD-10-CM | POA: Diagnosis not present

## 2019-10-18 DIAGNOSIS — R1310 Dysphagia, unspecified: Secondary | ICD-10-CM

## 2019-10-18 DIAGNOSIS — Z888 Allergy status to other drugs, medicaments and biological substances status: Secondary | ICD-10-CM | POA: Insufficient documentation

## 2019-10-18 DIAGNOSIS — E876 Hypokalemia: Secondary | ICD-10-CM | POA: Diagnosis not present

## 2019-10-18 HISTORY — PX: ESOPHAGOGASTRODUODENOSCOPY (EGD) WITH PROPOFOL: SHX5813

## 2019-10-18 HISTORY — PX: BOTOX INJECTION: SHX5754

## 2019-10-18 LAB — GLUCOSE, CAPILLARY: Glucose-Capillary: 114 mg/dL — ABNORMAL HIGH (ref 70–99)

## 2019-10-18 SURGERY — ESOPHAGOGASTRODUODENOSCOPY (EGD) WITH PROPOFOL
Anesthesia: Monitor Anesthesia Care

## 2019-10-18 MED ORDER — PROPOFOL 10 MG/ML IV BOLUS
INTRAVENOUS | Status: DC | PRN
  Administered 2019-10-18 (×2): 30 mg via INTRAVENOUS
  Administered 2019-10-18: 20 mg via INTRAVENOUS
  Administered 2019-10-18 (×3): 30 mg via INTRAVENOUS
  Administered 2019-10-18: 20 mg via INTRAVENOUS
  Administered 2019-10-18: 30 mg via INTRAVENOUS

## 2019-10-18 MED ORDER — LIDOCAINE 2% (20 MG/ML) 5 ML SYRINGE
INTRAMUSCULAR | Status: DC | PRN
  Administered 2019-10-18: 100 mg via INTRAVENOUS

## 2019-10-18 MED ORDER — SODIUM CHLORIDE (PF) 0.9 % IJ SOLN
INTRAMUSCULAR | Status: DC | PRN
  Administered 2019-10-18: 4 mL via SUBMUCOSAL

## 2019-10-18 MED ORDER — LACTATED RINGERS IV SOLN: INTRAVENOUS | Status: DC

## 2019-10-18 MED ORDER — PROPOFOL 1000 MG/100ML IV EMUL
INTRAVENOUS | Status: AC
  Filled 2019-10-18: qty 100

## 2019-10-18 MED ORDER — SODIUM CHLORIDE 0.9 % IV SOLN: INTRAVENOUS | Status: DC

## 2019-10-18 MED ORDER — ONABOTULINUMTOXINA 100 UNITS IJ SOLR
INTRAMUSCULAR | Status: AC
  Filled 2019-10-18: qty 100

## 2019-10-18 SURGICAL SUPPLY — 15 items

## 2019-10-18 NOTE — Transfer of Care (Signed)
Immediate Anesthesia Transfer of Care Note  Patient: Andrew Winters  Procedure(s) Performed: ESOPHAGOGASTRODUODENOSCOPY (EGD) WITH PROPOFOL (N/A ) BOTOX INJECTION (N/A )  Patient Location: PACU  Anesthesia Type:MAC  Level of Consciousness: sedated  Airway & Oxygen Therapy: Patient Spontanous Breathing and Patient connected to face mask oxygen  Post-op Assessment: Report given to RN and Post -op Vital signs reviewed and stable  Post vital signs: Reviewed and stable  Last Vitals:  Vitals Value Taken Time  BP    Temp    Pulse    Resp    SpO2      Last Pain:  Vitals:   10/18/19 0702  TempSrc: Oral  PainSc: 0-No pain         Complications: No apparent anesthesia complications

## 2019-10-18 NOTE — Interval H&P Note (Signed)
History and Physical Interval Note:  10/18/2019 7:32 AM  [name]  has presented today for surgery, with the diagnosis of Achalasia, esophageal dysphagia.  The various methods of treatment have been discussed with the patient and family. After consideration of risks, benefits and other options for treatment, the patient has consented to  Procedure(s): ESOPHAGOGASTRODUODENOSCOPY (EGD) WITH PROPOFOL (N/A) BOTOX INJECTION (N/A) as a surgical intervention.  The patient's history has been reviewed, patient examined, no change in status, stable for surgery.  I have reviewed the patient's chart and labs.  Questions were answered to the patient's satisfaction.     Andrew Winters

## 2019-10-18 NOTE — Anesthesia Procedure Notes (Signed)
Date/Time: 10/18/2019 7:57 AM Performed by: Jhonnie Garner, CRNA Oxygen Delivery Method: Simple face mask

## 2019-10-18 NOTE — Discharge Instructions (Signed)
YOU HAD AN ENDOSCOPIC PROCEDURE TODAY: Refer to the procedure report and other information in the discharge instructions given to you for any specific questions about what was found during the examination. If this information does not answer your questions, please call [place] office at [phone] to clarify.   YOU SHOULD EXPECT: Some feelings of bloating in the abdomen. Passage of more gas than usual. Walking can help get rid of the air that was put into your GI tract during the procedure and reduce the bloating. If you had a lower endoscopy (such as a colonoscopy or flexible sigmoidoscopy) you may notice spotting of blood in your stool or on the toilet paper. Some abdominal soreness may be present for a day or two, also.  DIET: Your first meal following the procedure should be a light meal and then it is ok to progress to your normal diet. A half-sandwich or bowl of soup is an example of a good first meal. Heavy or fried foods are harder to digest and may make you feel nauseous or bloated. Drink plenty of fluids but you should avoid alcoholic beverages for 24 hours. If you had a esophageal dilation, please see attached instructions for diet.    ACTIVITY: Your care partner should take you home directly after the procedure. You should plan to take it easy, moving slowly for the rest of the day. You can resume normal activity the day after the procedure however YOU SHOULD NOT DRIVE, use power tools, machinery or perform tasks that involve climbing or major physical exertion for 24 hours (because of the sedation medicines used during the test).   SYMPTOMS TO REPORT IMMEDIATELY: A gastroenterologist can be reached at any hour. Please call [phone]  for any of the following symptoms:   Following upper endoscopy (EGD, EUS, ERCP, esophageal dilation) Vomiting of blood or coffee ground material  New, significant abdominal pain  New, significant chest pain or pain under the shoulder blades  Painful or  persistently difficult swallowing  New shortness of breath  Black, tarry-looking or red, bloody stools  FOLLOW UP:  If any biopsies were taken you will be contacted by phone or by letter within the next 1-3 weeks. Call [phone]  if you have not heard about the biopsies in 3 weeks.  Please also call with any specific questions about appointments or follow up tests.  

## 2019-10-18 NOTE — Op Note (Addendum)
[facility] Patient Name: Andrew Winters Procedure Date: 10/18/2019 MRN: [id number] Attending MD: Estill Cotta. Loletha Carrow , MD Date of Birth: 26-Dec-1940 CSN: [id number] Age: 79 Admit Type: Inpatient Procedure:                Upper GI endoscopy Indications:              Esophageal dysphagia, For botulinum toxin injection                            of achalasia Providers:                Mallie Mussel L. Loletha Carrow, MD, Cleda Daub, RN, Janeece Agee,                            Technician, Marla Roe, CRNA Referring MD:              Medicines:                Monitored Anesthesia Care Complications:            No immediate complications. Estimated Blood Loss:     Estimated blood loss was minimal. Procedure:                Pre-Anesthesia Assessment:                           - Prior to the procedure, a History and Physical                            was performed, and patient medications and                            allergies were reviewed. The patient's tolerance of                            previous anesthesia was also reviewed. The risks                            and benefits of the procedure and the sedation                            options and risks were discussed with the patient.                            All questions were answered, and informed consent                            was obtained. Prior Anticoagulants: The patient has                            taken Plavix (clopidogrel), last dose was 3 days                            prior to procedure. ASA Grade Assessment: III - A  patient with severe systemic disease. After                            reviewing the risks and benefits, the patient was                            deemed in satisfactory condition to undergo the                            procedure.                           After obtaining informed consent, the endoscope was                            passed under direct vision. Throughout  the                            procedure, the patient's blood pressure, pulse, and                            oxygen saturations were monitored continuously. The                            GIF-H190 ([id number]) Olympus gastroscope was                            introduced through the mouth, and advanced to the                            second part of duodenum. The upper GI endoscopy was                            accomplished without difficulty. The patient                            tolerated the procedure well. Scope In: Scope Out: Findings:      A hypertonic lower esophageal sphincter was found. There was mild       resistance to endoscope advancement into the stomach (though less       resistance than on most recent EGD). The Z-line was regular. The       gastroesophageal junction and cardia were normal on retroflexed view.       Area was successfully injected with 100 units botulinum toxin. There was       also vigorous motility in the remainder of the esophagus.      A small amount of food (residue) was found in the gastric fundus.      The cardia and gastric fundus were normal on retroflexion.      The examined duodenum was normal. Impression:               - Achalasia. Injected with botulinum toxin.                           - A small amount of food (residue) in the stomach.                           -  Normal examined duodenum.                           - No specimens collected. Moderate Sedation:      MAC sedation used Recommendation:           - Patient has a contact number available for                            emergencies. The signs and symptoms of potential                            delayed complications were discussed with the                            patient. Return to normal activities tomorrow.                            Written discharge instructions were provided to the                            patient.                           - Resume previous diet.                            - Resume Plavix (clopidogrel) at prior dose                            tomorrow. Procedure Code(s):        --- Professional ---                           [phone], Esophagogastroduodenoscopy, flexible,                            transoral; with directed submucosal injection(s),                            any substance Diagnosis Code(s):        --- Professional ---                           K22.0, Achalasia of cardia                           R13.14, Dysphagia, pharyngoesophageal phase CPT copyright 2019 [employer] Medical Association. All rights reserved. The codes documented in this report are preliminary and upon coder review may  be revised to meet current compliance requirements. Wolf Boulay L. Loletha Carrow, MD 10/18/2019 8:16:09 AM This report has been signed electronically. Number of Addenda: 0

## 2019-10-18 NOTE — H&P (Signed)
History:  This patient presents for endoscopic testing for dysphagia/achalasia.  [name] Referring physician: Crist Fat, MD  Past Medical History: Past Medical History:  Diagnosis Date  . Acute saddle pulmonary embolism (HCC) 05/20/2016  . Anxiety   . Bilateral swelling of feet   . BPH (benign prostatic hyperplasia)    no meds needed per medical md  . Cataracts, bilateral    immature  . Chronic diarrhea   . Deafness    in left ear   . Depression    takes Zoloft daily  . Diabetes mellitus    takes Actos and Amaryl daily as well as Onglyza  . Dysphagia   . Fatigue   . GERD (gastroesophageal reflux disease)    takes Omeprazole daily  . Headaches, cluster   . Hiatal hernia   . Hyperlipemia    was on meds but hasnt been on anything in about 43months per MD  . Hypertension    takes Amlodipine,HCTZ,and Lisinopril daily  . Hyperthyroidism   . Insomnia    takes Trazodone nightly  . Nocturia   . Osteoarthritis   . Peripheral neuropathy    takes Gabapentin daily  . Pneumonia 3+ yrs ago  . Presbyesophagus   . Rheumatoid arthritis(714.0)   . Vitamin D deficiency    takes Vit D daily     Past Surgical History: Past Surgical History:  Procedure Laterality Date  . BACK SURGERY  1998  . BOTOX INJECTION N/A 11/11/2016   Procedure: BOTOX INJECTION;  Surgeon: Sherrilyn Rist, MD;  Location: [facility];  Service: Gastroenterology;  Laterality: N/A;  . BOTOX INJECTION N/A 09/07/2018   Procedure: BOTOX INJECTION;  Surgeon: Sherrilyn Rist, MD;  Location: [facility];  Service: Gastroenterology;  Laterality: N/A;  . CATARACT EXTRACTION, BILATERAL  2018  . COLONOSCOPY WITH ESOPHAGOGASTRODUODENOSCOPY (EGD) AND ESOPHAGEAL DILATION (ED)    . ESOPHAGEAL MANOMETRY N/A 09/14/2016   Procedure: ESOPHAGEAL MANOMETRY (EM);  Surgeon: Ruffin Frederick, MD;  Location: [facility];  Service: Gastroenterology;  Laterality: N/A;  . ESOPHAGOGASTRODUODENOSCOPY N/A 11/11/2016   Procedure: ESOPHAGOGASTRODUODENOSCOPY (EGD);  Surgeon: Sherrilyn Rist, MD;  Location: [facility];  Service: Gastroenterology;  Laterality: N/A;  . ESOPHAGOGASTRODUODENOSCOPY (EGD) WITH PROPOFOL N/A 09/15/2016   Procedure: ESOPHAGOGASTRODUODENOSCOPY (EGD) WITH PROPOFOL;  Surgeon: Ruffin Frederick, MD;  Location: [facility];  Service: Gastroenterology;  Laterality: N/A;  . ESOPHAGOGASTRODUODENOSCOPY (EGD) WITH PROPOFOL N/A 09/07/2018   Procedure: ESOPHAGOGASTRODUODENOSCOPY (EGD) WITH PROPOFOL;  Surgeon: Sherrilyn Rist, MD;  Location: [facility];  Service: Gastroenterology;  Laterality: N/A;  . IRRIGATION AND DEBRIDEMENT KNEE Right 01/12/2015  . TOTAL KNEE ARTHROPLASTY Right 01/06/2015   Procedure: TOTAL KNEE ARTHROPLASTY;  Surgeon: Marcene Corning, MD;  Location: [facility] OR;  Service: Orthopedics;  Laterality: Right;    Allergies: Allergies  Allergen Reactions  . Donepezil Diarrhea  . Aspirin Diarrhea    Stomach pain    Outpatient Meds: No current facility-administered medications for this encounter.      ___________________________________________________________________ Objective   Exam:  BP (!) 167/73   Pulse 65   Temp 98.5 F (36.9 C) (Oral)   Resp 19   Ht 5\' 9"  (1.753 m)   Wt 127.5 kg   SpO2 97%   BMI 41.51 kg/m    CV: RRR without murmur, S1/S2, no JVD, no peripheral edema  Resp: clear to auscultation bilaterally, normal RR and effort noted  GI: soft, no tenderness, with active bowel sounds. No guarding or palpable organomegaly noted.  Neuro: awake, alert and oriented x 3. Normal gross motor function and fluent speech   Assessment:  Achalasia  Plan:  EGD with Botox of LES   Nelida Meuse III

## 2019-10-21 ENCOUNTER — Encounter: Payer: Self-pay | Admitting: *Deleted

## 2019-10-28 NOTE — Anesthesia Postprocedure Evaluation (Signed)
Anesthesia Post Note  Patient: Andrew Winters  Procedure(s) Performed: ESOPHAGOGASTRODUODENOSCOPY (EGD) WITH PROPOFOL (N/A ) BOTOX INJECTION (N/A )     Patient location during evaluation: PACU Anesthesia Type: MAC Level of consciousness: awake and alert Pain management: pain level controlled Vital Signs Assessment: post-procedure vital signs reviewed and stable Respiratory status: spontaneous breathing, nonlabored ventilation, respiratory function stable and patient connected to nasal cannula oxygen Cardiovascular status: stable and blood pressure returned to baseline Postop Assessment: no apparent nausea or vomiting Anesthetic complications: no    Last Vitals:  Vitals:   10/18/19 0845 10/18/19 0850  BP: (!) 197/94 (!) 172/74  Pulse: (!) 56 (!) 54  Resp: 16 17  Temp:    SpO2: 96% 95%    Last Pain:  Vitals:   10/18/19 0850  TempSrc:   PainSc: 0-No pain                 Zhaniya Swallows

## 2019-11-08 ENCOUNTER — Other Ambulatory Visit: Payer: Self-pay

## 2019-11-08 ENCOUNTER — Ambulatory Visit: Payer: Medicare HMO | Admitting: Sports Medicine

## 2019-11-08 ENCOUNTER — Encounter: Payer: Self-pay | Admitting: Sports Medicine

## 2019-11-08 DIAGNOSIS — M2142 Flat foot [pes planus] (acquired), left foot: Secondary | ICD-10-CM | POA: Diagnosis not present

## 2019-11-08 DIAGNOSIS — M79674 Pain in right toe(s): Secondary | ICD-10-CM

## 2019-11-08 DIAGNOSIS — B351 Tinea unguium: Secondary | ICD-10-CM | POA: Diagnosis not present

## 2019-11-08 DIAGNOSIS — E1142 Type 2 diabetes mellitus with diabetic polyneuropathy: Secondary | ICD-10-CM

## 2019-11-08 DIAGNOSIS — M2041 Other hammer toe(s) (acquired), right foot: Secondary | ICD-10-CM | POA: Diagnosis not present

## 2019-11-08 DIAGNOSIS — M2042 Other hammer toe(s) (acquired), left foot: Secondary | ICD-10-CM | POA: Diagnosis not present

## 2019-11-08 DIAGNOSIS — M2141 Flat foot [pes planus] (acquired), right foot: Secondary | ICD-10-CM

## 2019-11-08 DIAGNOSIS — M79675 Pain in left toe(s): Secondary | ICD-10-CM | POA: Diagnosis not present

## 2019-11-08 DIAGNOSIS — I739 Peripheral vascular disease, unspecified: Secondary | ICD-10-CM

## 2019-11-08 NOTE — Progress Notes (Signed)
Subjective: Andrew Winters is a 79 y.o. male patient with history of diabetes who presents to office today complaining of long,mildly painful nails  while ambulating in shoes; unable to trim. Patient states that the glucose reading this morning was 140 mg/dl. Patient desires new diabetic shoes.   PCP VanEyk x Jan.   Review of Systems  All other systems reviewed and are negative.   Patient Active Problem List   Diagnosis Date Noted  . Severe sepsis (HCC) 09/26/2017  . UTI (urinary tract infection) 09/26/2017  . Abnormal loss of weight   . History of snoring 09/20/2016  . Chronic diarrhea 09/20/2016  . Urinary tract infection without hematuria   . Vascular dementia without behavioral disturbance (HCC)   . Hypokalemia   . Dysphagia   . Chest pain 09/11/2016  . Pulmonary emboli (HCC) 05/20/2016  . Acute saddle pulmonary embolism without acute cor pulmonale (HCC) 05/20/2016  . Pseudogout of right knee 01/13/2015  . DVT of upper extremity (deep vein thrombosis): LEFT 01/11/2015  . Anemia, iron deficiency 01/11/2015  . Postoperative fever   . Fever 01/08/2015  . Diabetes mellitus type 2, controlled (HCC) 01/07/2015  . Essential hypertension 01/07/2015  . SIRS (systemic inflammatory response syndrome) (HCC) 01/07/2015  . Acute encephalopathy   . Primary osteoarthritis of right knee 01/06/2015  . Diabetes (HCC) 01/06/2015  . Morbid obesity (HCC) 01/06/2015  . Diabetic neuropathy associated with type 2 diabetes mellitus (HCC) 01/05/2010  . HEADACHE 05/07/2009  . UNSPECIFIED VITAMIN D DEFICIENCY 02/03/2009  . LIVER FUNCTION TESTS, ABNORMAL 07/19/2007  . HYPERTHYROIDISM 01/04/2007  . DIABETES MELLITUS, TYPE II 01/04/2007  . HYPERLIPIDEMIA 01/04/2007  . ERECTILE DYSFUNCTION 01/04/2007  . DEPRESSION 01/04/2007  . GERD 01/04/2007  . BENIGN PROSTATIC HYPERTROPHY 01/04/2007  . OSTEOARTHRITIS 01/04/2007   Current Outpatient Medications on File Prior to Visit  Medication Sig Dispense  Refill  . acetaminophen (TYLENOL) 500 MG tablet Take 1,000 mg by mouth daily as needed for mild pain, moderate pain or headache.     Marland Kitchen atorvastatin (LIPITOR) 40 MG tablet Take 40 mg by mouth at bedtime.     . bismuth subsalicylate (PEPTO BISMOL) 262 MG/15ML suspension Take 30 mLs by mouth every 6 (six) hours as needed for indigestion or diarrhea or loose stools.    . cetirizine (ZYRTEC) 10 MG tablet Take 10 mg by mouth daily.    . cholecalciferol (VITAMIN D) 1000 units tablet Take 1,000 Units by mouth daily.     Marland Kitchen dextromethorphan-guaiFENesin (MUCINEX DM) 30-600 MG 12hr tablet Take 1 tablet by mouth 2 (two) times daily.    Marland Kitchen diltiazem (CARDIZEM) 60 MG tablet Take 60 mg by mouth in the morning and at bedtime.    . ferrous sulfate 325 (65 FE) MG tablet Take 325 mg by mouth daily with breakfast.    . furosemide (LASIX) 20 MG tablet Take 1 tablet (20 mg total) by mouth daily.    Marland Kitchen gabapentin (NEURONTIN) 100 MG capsule Take 1-2 capsules (100-200 mg total) by mouth See admin instructions. 100mg  in the morning and at noon and 200mg  at bedtime (Patient taking differently: Take 100-200 mg by mouth See admin instructions. 100mg  in the morning, 100 mg in the evening, and 200mg  at bedtime)    . glimepiride (AMARYL) 4 MG tablet Take 4 mg by mouth 2 (two) times daily.     lisinopril (PRINIVIL,ZESTRIL) 20 MG tablet Take 20 mg by mouth daily.    lisinopril (ZESTRIL) 30 MG tablet Take 30 mg by  mouth daily.    Marland Kitchen loperamide (IMODIUM) 1 MG/5ML solution Take 3 mg by mouth as needed for diarrhea or loose stools.    . memantine (NAMENDA) 10 MG tablet Take 10 mg by mouth 2 (two) times daily.    . memantine (NAMENDA) 5 MG tablet Take 1 tablet (5 mg total) by mouth daily. (Patient not taking: Reported on 10/15/2019) 30 tablet 0  . Multiple Vitamin (MULTIVITAMIN WITH MINERALS) TABS tablet Take 1 tablet by mouth daily.    Marland Kitchen omeprazole (PRILOSEC) 20 MG capsule Take 40 mg by mouth daily.    Bertram Gala Glycol-Propyl Glycol  (SYSTANE OP) Place 1 drop into both eyes daily as needed (dry eyes).    . potassium chloride (K-DUR) 10 MEQ tablet Take 10 mEq by mouth daily.    . propranolol (INDERAL) 10 MG tablet Take 10 mg by mouth 3 (three) times daily.    . rivaroxaban (XARELTO) 20 MG TABS tablet Take 1 tablet (20 mg total) by mouth daily.    . sertraline (ZOLOFT) 50 MG tablet Take 50 mg by mouth daily.    . traMADol (ULTRAM) 50 MG tablet Take 50 mg by mouth every 8 (eight) hours as needed for moderate pain.    . traZODone (DESYREL) 50 MG tablet Take 50 mg by mouth at bedtime.    Marland Kitchen VICTOZA 18 MG/3ML SOPN Inject 0.6 mg into the skin daily at 12 noon.     . vitamin E 400 UNIT capsule Take 400 Units by mouth daily.     No current facility-administered medications on file prior to visit.   Allergies  Allergen Reactions  . Donepezil Diarrhea  . Aspirin Diarrhea    Stomach pain    Recent Results (from the past 2160 hour(s))  SARS CORONAVIRUS 2 (TAT 6-24 HRS) Nasopharyngeal Nasopharyngeal Swab     Status: None   Collection Time: 10/15/19 10:20 AM   Specimen: Nasopharyngeal Swab  Result Value Ref Range   SARS Coronavirus 2 NEGATIVE NEGATIVE    Comment: (NOTE) SARS-CoV-2 target nucleic acids are NOT DETECTED. The SARS-CoV-2 RNA is generally detectable in upper and lower respiratory specimens during the acute phase of infection. Negative results do not preclude SARS-CoV-2 infection, do not rule out co-infections with other pathogens, and should not be used as the sole basis for treatment or other patient management decisions. Negative results must be combined with clinical observations, patient history, and epidemiological information. The expected result is Negative. Fact Sheet for Patients: HairSlick.no Fact Sheet for Healthcare Providers: quierodirigir.com This test is not yet approved or cleared by the [place] FDA and  has been authorized for  detection and/or diagnosis of SARS-CoV-2 by FDA under an Emergency Use Authorization (EUA). This EUA will remain  in effect (meaning this test can be used) for the duration of the COVID-19 declaration under Section 56 4(b)(1) of the Act, 21 U.S.C. section 360bbb-3(b)(1), unless the authorization is terminated or revoked sooner. Performed at [facility], [address], [place], [place] [address]   Glucose, capillary     Status: Abnormal   Collection Time: 10/18/19  7:32 AM  Result Value Ref Range   Glucose-Capillary 114 (H) 70 - 99 mg/dL    Comment: Glucose reference range applies only to samples taken after fasting for at least 8 hours.    Objective: General: Patient is awake, alert, and oriented x 3 and in no acute distress.  Integument: Skin is warm, dry and supple bilateral. Nails are tender, long, thickened  and dystrophic with subungual debris, consistent with onychomycosis, 1-5 bilateral. No signs of infection. No open lesions or preulcerative lesions present bilateral. Remaining integument unremarkable.  Vasculature:  Dorsalis Pedis pulse 1/4 bilateral. Posterior Tibial pulse  0/4 bilateral. Capillary fill time <5 sec 1-5 bilateral. Scant hair growth to the level of the digits. Temperature gradient within normal limits. No varicosities present bilateral. Trace edema present bilateral.   Neurology: The patient has intact sensation measured with a 5.07/10g Semmes Weinstein Monofilament at all pedal sites bilateral . Vibratory sensation diminished bilateral with tuning fork. No Babinski sign present bilateral.   Musculoskeletal: Asymptomatic pes planus and hammertoe pedal deformities noted bilateral. Muscular strength 5/5 in all lower extremity muscular groups bilateral without pain on range of motion.  No tenderness with calf compression bilateral.  Assessment and Plan: Problem List Items Addressed This Visit      Endocrine   Diabetic neuropathy associated with type 2  diabetes mellitus ([place])    Other Visit Diagnoses    Pain due to onychomycosis of toenails of both feet    -  Primary   PVD (peripheral vascular disease) ([place])       Pes planus of both feet       Hammer toes of both feet          -Examined patient. -Discussed and educated patient on diabetic foot care, especially with  regards to the vascular, neurological and musculoskeletal systems.  -Stressed the importance of good glycemic control and the detriment of not  controlling glucose levels in relation to the foot. -Mechanically debrided all nails 1-5 bilateral using sterile nail nipper and filed with dremel without incident  -Office to continue patient re: Diabetic shoes for measurements and send certifcation paperwork to MD -Answered all patient questions -Patient to return  in 3 months for at risk foot care -Patient advised to call the office if any problems or questions arise in the meantime.  Landis Martins, DPM

## 2019-12-06 DIAGNOSIS — E119 Type 2 diabetes mellitus without complications: Secondary | ICD-10-CM | POA: Diagnosis not present

## 2019-12-07 DIAGNOSIS — Z0100 Encounter for examination of eyes and vision without abnormal findings: Secondary | ICD-10-CM | POA: Diagnosis not present

## 2019-12-18 ENCOUNTER — Other Ambulatory Visit: Payer: Medicare HMO | Admitting: Orthotics

## 2019-12-20 DIAGNOSIS — R262 Difficulty in walking, not elsewhere classified: Secondary | ICD-10-CM | POA: Diagnosis not present

## 2019-12-20 DIAGNOSIS — E1140 Type 2 diabetes mellitus with diabetic neuropathy, unspecified: Secondary | ICD-10-CM | POA: Diagnosis not present

## 2019-12-20 DIAGNOSIS — G894 Chronic pain syndrome: Secondary | ICD-10-CM | POA: Diagnosis not present

## 2019-12-20 DIAGNOSIS — F0280 Dementia in other diseases classified elsewhere without behavioral disturbance: Secondary | ICD-10-CM | POA: Diagnosis not present

## 2019-12-20 DIAGNOSIS — G309 Alzheimer's disease, unspecified: Secondary | ICD-10-CM | POA: Diagnosis not present

## 2019-12-20 DIAGNOSIS — E119 Type 2 diabetes mellitus without complications: Secondary | ICD-10-CM | POA: Diagnosis not present

## 2019-12-20 DIAGNOSIS — F331 Major depressive disorder, recurrent, moderate: Secondary | ICD-10-CM | POA: Diagnosis not present

## 2020-01-14 ENCOUNTER — Other Ambulatory Visit: Payer: Medicare HMO | Admitting: Orthotics

## 2020-01-14 ENCOUNTER — Other Ambulatory Visit: Payer: Self-pay

## 2020-02-07 ENCOUNTER — Encounter: Payer: Self-pay | Admitting: Sports Medicine

## 2020-02-07 ENCOUNTER — Other Ambulatory Visit: Payer: Self-pay

## 2020-02-07 ENCOUNTER — Ambulatory Visit: Payer: Medicare HMO | Admitting: Sports Medicine

## 2020-02-07 DIAGNOSIS — E1142 Type 2 diabetes mellitus with diabetic polyneuropathy: Secondary | ICD-10-CM

## 2020-02-07 DIAGNOSIS — M2041 Other hammer toe(s) (acquired), right foot: Secondary | ICD-10-CM

## 2020-02-07 DIAGNOSIS — M79675 Pain in left toe(s): Secondary | ICD-10-CM | POA: Diagnosis not present

## 2020-02-07 DIAGNOSIS — B351 Tinea unguium: Secondary | ICD-10-CM

## 2020-02-07 DIAGNOSIS — M2141 Flat foot [pes planus] (acquired), right foot: Secondary | ICD-10-CM

## 2020-02-07 DIAGNOSIS — M79674 Pain in right toe(s): Secondary | ICD-10-CM | POA: Diagnosis not present

## 2020-02-07 DIAGNOSIS — M2142 Flat foot [pes planus] (acquired), left foot: Secondary | ICD-10-CM

## 2020-02-07 DIAGNOSIS — M2042 Other hammer toe(s) (acquired), left foot: Secondary | ICD-10-CM

## 2020-02-07 DIAGNOSIS — I739 Peripheral vascular disease, unspecified: Secondary | ICD-10-CM | POA: Diagnosis not present

## 2020-02-07 NOTE — Progress Notes (Signed)
Subjective: Andrew Winters is a 79 y.o. male patient with history of diabetes who presents to office today complaining of long,mildly painful nails  while ambulating in shoes; unable to trim. Is wondering if diabetic shoes are in? Patient states that the glucose reading this morning was 181 mg/dl and does not know his last A1C. Patient is assisted by wife who helps to report history.  PCP Dr. Leonia Reader x June   Patient Active Problem List   Diagnosis Date Noted   Severe sepsis (HCC) 09/26/2017   UTI (urinary tract infection) 09/26/2017   Abnormal loss of weight    History of snoring 09/20/2016   Chronic diarrhea 09/20/2016   Urinary tract infection without hematuria    Vascular dementia without behavioral disturbance (HCC)    Hypokalemia    Dysphagia    Chest pain 09/11/2016   Pulmonary emboli (HCC) 05/20/2016   Acute saddle pulmonary embolism without acute cor pulmonale (HCC) 05/20/2016   Pseudogout of right knee 01/13/2015   DVT of upper extremity (deep vein thrombosis): LEFT 01/11/2015   Anemia, iron deficiency 01/11/2015   Postoperative fever    Fever 01/08/2015   Diabetes mellitus type 2, controlled (HCC) 01/07/2015   Essential hypertension 01/07/2015   SIRS (systemic inflammatory response syndrome) (HCC) 01/07/2015   Acute encephalopathy    Primary osteoarthritis of right knee 01/06/2015   Diabetes (HCC) 01/06/2015   Morbid obesity (HCC) 01/06/2015   Diabetic neuropathy associated with type 2 diabetes mellitus (HCC) 01/05/2010   HEADACHE 05/07/2009   UNSPECIFIED VITAMIN D DEFICIENCY 02/03/2009   LIVER FUNCTION TESTS, ABNORMAL 07/19/2007   HYPERTHYROIDISM 01/04/2007   DIABETES MELLITUS, TYPE II 01/04/2007   HYPERLIPIDEMIA 01/04/2007   ERECTILE DYSFUNCTION 01/04/2007   DEPRESSION 01/04/2007   GERD 01/04/2007   BENIGN PROSTATIC HYPERTROPHY 01/04/2007   OSTEOARTHRITIS 01/04/2007   Current Outpatient Medications on File Prior to Visit   Medication Sig Dispense Refill   acetaminophen (TYLENOL) 500 MG tablet Take 1,000 mg by mouth daily as needed for mild pain, moderate pain or headache.      atorvastatin (LIPITOR) 40 MG tablet Take 40 mg by mouth at bedtime.      bismuth subsalicylate (PEPTO BISMOL) 262 MG/15ML suspension Take 30 mLs by mouth every 6 (six) hours as needed for indigestion or diarrhea or loose stools.     cetirizine (ZYRTEC) 10 MG tablet Take 10 mg by mouth daily.     cholecalciferol (VITAMIN D) 1000 units tablet Take 1,000 Units by mouth daily.      dextromethorphan-guaiFENesin (MUCINEX DM) 30-600 MG 12hr tablet Take 1 tablet by mouth 2 (two) times daily.     diltiazem (CARDIZEM) 60 MG tablet Take 60 mg by mouth in the morning and at bedtime.     ferrous sulfate 325 (65 FE) MG tablet Take 325 mg by mouth daily with breakfast.     furosemide (LASIX) 20 MG tablet Take 1 tablet (20 mg total) by mouth daily.     gabapentin (NEURONTIN) 100 MG capsule Take 1-2 capsules (100-200 mg total) by mouth See admin instructions. 100mg  in the morning and at noon and 200mg  at bedtime (Patient taking differently: Take 100-200 mg by mouth See admin instructions. 100mg  in the morning, 100 mg in the evening, and 200mg  at bedtime)     glimepiride (AMARYL) 4 MG tablet Take 4 mg by mouth 2 (two) times daily.      lisinopril (PRINIVIL,ZESTRIL) 20 MG tablet Take 20 mg by mouth daily.     lisinopril (ZESTRIL)  30 MG tablet Take 30 mg by mouth daily.     loperamide (IMODIUM) 1 MG/5ML solution Take 3 mg by mouth as needed for diarrhea or loose stools.     memantine (NAMENDA) 10 MG tablet Take 10 mg by mouth 2 (two) times daily.     memantine (NAMENDA) 5 MG tablet Take 1 tablet (5 mg total) by mouth daily. (Patient not taking: Reported on 10/15/2019) 30 tablet 0   Multiple Vitamin (MULTIVITAMIN WITH MINERALS) TABS tablet Take 1 tablet by mouth daily.     omeprazole (PRILOSEC) 20 MG capsule Take 40 mg by mouth daily.      Polyethyl Glycol-Propyl Glycol (SYSTANE OP) Place 1 drop into both eyes daily as needed (dry eyes).     potassium chloride (K-DUR) 10 MEQ tablet Take 10 mEq by mouth daily.     propranolol (INDERAL) 10 MG tablet Take 10 mg by mouth 3 (three) times daily.     rivaroxaban (XARELTO) 20 MG TABS tablet Take 1 tablet (20 mg total) by mouth daily.     sertraline (ZOLOFT) 50 MG tablet Take 50 mg by mouth daily.     traMADol (ULTRAM) 50 MG tablet Take 50 mg by mouth every 8 (eight) hours as needed for moderate pain.     traZODone (DESYREL) 50 MG tablet Take 50 mg by mouth at bedtime.     VICTOZA 18 MG/3ML SOPN Inject 0.6 mg into the skin daily at 12 noon.      vitamin E 400 UNIT capsule Take 400 Units by mouth daily.     No current facility-administered medications on file prior to visit.   Allergies  Allergen Reactions   Donepezil Diarrhea   Aspirin Diarrhea    Stomach pain    No results found for this or any previous visit (from the past 2160 hour(s)).  Objective: General: Patient is awake, alert, and oriented x 3 and in no acute distress.  Integument: Skin is warm, dry and supple bilateral. Nails are tender, long, thickened and dystrophic with subungual debris, consistent with onychomycosis, 1-5 bilateral. No signs of infection. No open lesions or preulcerative lesions present bilateral. Dry skin at heels. Remaining integument unremarkable.  Vasculature:  Dorsalis Pedis pulse 1/4 bilateral. Posterior Tibial pulse  0/4 bilateral. Capillary fill time <5 sec 1-5 bilateral. Scant hair growth to the level of the digits. Temperature gradient within normal limits. No varicosities present bilateral. Trace edema present bilateral.   Neurology: The patient has intact sensation measured with a 5.07/10g Semmes Weinstein Monofilament at all pedal sites bilateral . Vibratory sensation diminished bilateral with tuning fork. Unchanged from prior.   Musculoskeletal: Asymptomatic pes planus and  hammertoe pedal deformities noted bilateral. Muscular strength 5/5 in all lower extremity muscular groups bilateral without pain on range of motion.  No tenderness with calf compression bilateral.  Assessment and Plan: Problem List Items Addressed This Visit      Endocrine   Diabetic neuropathy associated with type 2 diabetes mellitus (HCC)    Other Visit Diagnoses    Pain due to onychomycosis of toenails of both feet    -  Primary   PVD (peripheral vascular disease) (HCC)       Pes planus of both feet       Hammer toes of both feet          -Examined patient. -Re-Discussed and educated patient on diabetic foot care, especially with  regards to the vascular, neurological and musculoskeletal systems.  -Mechanically debrided all nails  1-5 bilateral using sterile nail nipper and filed with dremel without incident  -Dispensed sample of foot miracle cream for dry skin -Awaiting Diabetic shoes  -Answered all patient questions -Patient to return  in 3 months for at risk foot care -Patient advised to call the office if any problems or questions arise in the meantime.  Asencion Islam, DPM

## 2020-02-25 ENCOUNTER — Other Ambulatory Visit: Payer: Medicare HMO | Admitting: Orthotics

## 2020-02-25 ENCOUNTER — Other Ambulatory Visit: Payer: Self-pay

## 2020-03-18 ENCOUNTER — Telehealth: Payer: Self-pay | Admitting: Sports Medicine

## 2020-03-18 NOTE — Telephone Encounter (Signed)
pts wife left message wanting to get pt scheduled to pick up shoes.  I returned call and left message for pts wife to call back to schedule an appt.

## 2020-03-20 ENCOUNTER — Other Ambulatory Visit: Payer: Medicare HMO

## 2020-04-21 ENCOUNTER — Ambulatory Visit (INDEPENDENT_AMBULATORY_CARE_PROVIDER_SITE_OTHER): Payer: Medicare HMO | Admitting: Orthotics

## 2020-04-21 ENCOUNTER — Other Ambulatory Visit: Payer: Self-pay

## 2020-04-21 DIAGNOSIS — M2142 Flat foot [pes planus] (acquired), left foot: Secondary | ICD-10-CM

## 2020-04-21 DIAGNOSIS — M2041 Other hammer toe(s) (acquired), right foot: Secondary | ICD-10-CM

## 2020-04-21 DIAGNOSIS — E1142 Type 2 diabetes mellitus with diabetic polyneuropathy: Secondary | ICD-10-CM

## 2020-04-21 DIAGNOSIS — M2141 Flat foot [pes planus] (acquired), right foot: Secondary | ICD-10-CM

## 2020-04-21 DIAGNOSIS — M2042 Other hammer toe(s) (acquired), left foot: Secondary | ICD-10-CM

## 2020-04-21 NOTE — Progress Notes (Signed)

## 2020-04-22 ENCOUNTER — Other Ambulatory Visit: Payer: Medicare HMO | Admitting: Orthotics

## 2020-05-15 ENCOUNTER — Other Ambulatory Visit: Payer: Self-pay

## 2020-05-15 ENCOUNTER — Ambulatory Visit: Payer: Medicare HMO | Admitting: Sports Medicine

## 2020-05-15 ENCOUNTER — Encounter: Payer: Self-pay | Admitting: Sports Medicine

## 2020-05-15 DIAGNOSIS — I739 Peripheral vascular disease, unspecified: Secondary | ICD-10-CM | POA: Diagnosis not present

## 2020-05-15 DIAGNOSIS — M79675 Pain in left toe(s): Secondary | ICD-10-CM

## 2020-05-15 DIAGNOSIS — B351 Tinea unguium: Secondary | ICD-10-CM | POA: Diagnosis not present

## 2020-05-15 DIAGNOSIS — E1142 Type 2 diabetes mellitus with diabetic polyneuropathy: Secondary | ICD-10-CM | POA: Diagnosis not present

## 2020-05-15 DIAGNOSIS — M79674 Pain in right toe(s): Secondary | ICD-10-CM

## 2020-05-15 NOTE — Progress Notes (Signed)
Subjective: Andrew Winters is a 79 y.o. male patient with history of diabetes who presents to office today complaining of long,mildly painful nails  while ambulating in shoes; unable to trim. FBS not recorded.  PCP Dr. Debbe Bales  Assisted by wife.   Patient Active Problem List   Diagnosis Date Noted   Severe sepsis (HCC) 09/26/2017   UTI (urinary tract infection) 09/26/2017   Abnormal loss of weight    History of snoring 09/20/2016   Chronic diarrhea 09/20/2016   Urinary tract infection without hematuria    Vascular dementia without behavioral disturbance (HCC)    Hypokalemia    Dysphagia    Chest pain 09/11/2016   Pulmonary emboli (HCC) 05/20/2016   Acute saddle pulmonary embolism without acute cor pulmonale (HCC) 05/20/2016   Pseudogout of right knee 01/13/2015   DVT of upper extremity (deep vein thrombosis): LEFT 01/11/2015   Anemia, iron deficiency 01/11/2015   Postoperative fever    Fever 01/08/2015   Diabetes mellitus type 2, controlled (HCC) 01/07/2015   Essential hypertension 01/07/2015   SIRS (systemic inflammatory response syndrome) (HCC) 01/07/2015   Acute encephalopathy    Primary osteoarthritis of right knee 01/06/2015   Diabetes (HCC) 01/06/2015   Morbid obesity (HCC) 01/06/2015   Diabetic neuropathy associated with type 2 diabetes mellitus (HCC) 01/05/2010   HEADACHE 05/07/2009   UNSPECIFIED VITAMIN D DEFICIENCY 02/03/2009   LIVER FUNCTION TESTS, ABNORMAL 07/19/2007   HYPERTHYROIDISM 01/04/2007   DIABETES MELLITUS, TYPE II 01/04/2007   HYPERLIPIDEMIA 01/04/2007   ERECTILE DYSFUNCTION 01/04/2007   DEPRESSION 01/04/2007   GERD 01/04/2007   BENIGN PROSTATIC HYPERTROPHY 01/04/2007   OSTEOARTHRITIS 01/04/2007   Current Outpatient Medications on File Prior to Visit  Medication Sig Dispense Refill   acetaminophen (TYLENOL) 500 MG tablet Take 1,000 mg by mouth daily as needed for mild pain, moderate pain or headache.       atorvastatin (LIPITOR) 40 MG tablet Take 40 mg by mouth at bedtime.      bismuth subsalicylate (PEPTO BISMOL) 262 MG/15ML suspension Take 30 mLs by mouth every 6 (six) hours as needed for indigestion or diarrhea or loose stools.     cetirizine (ZYRTEC) 10 MG tablet Take 10 mg by mouth daily.     cholecalciferol (VITAMIN D) 1000 units tablet Take 1,000 Units by mouth daily.      dextromethorphan-guaiFENesin (MUCINEX DM) 30-600 MG 12hr tablet Take 1 tablet by mouth 2 (two) times daily.     diltiazem (CARDIZEM) 60 MG tablet Take 60 mg by mouth in the morning and at bedtime.     ferrous sulfate 325 (65 FE) MG tablet Take 325 mg by mouth daily with breakfast.     furosemide (LASIX) 20 MG tablet Take 1 tablet (20 mg total) by mouth daily.     gabapentin (NEURONTIN) 100 MG capsule Take 1-2 capsules (100-200 mg total) by mouth See admin instructions. 100mg  in the morning and at noon and 200mg  at bedtime (Patient taking differently: Take 100-200 mg by mouth See admin instructions. 100mg  in the morning, 100 mg in the evening, and 200mg  at bedtime)     glimepiride (AMARYL) 4 MG tablet Take 4 mg by mouth 2 (two) times daily.      lisinopril (PRINIVIL,ZESTRIL) 20 MG tablet Take 20 mg by mouth daily.     lisinopril (ZESTRIL) 30 MG tablet Take 30 mg by mouth daily.     loperamide (IMODIUM) 1 MG/5ML solution Take 3 mg by mouth as needed for diarrhea or loose stools.  memantine (NAMENDA) 10 MG tablet Take 10 mg by mouth 2 (two) times daily.     memantine (NAMENDA) 5 MG tablet Take 1 tablet (5 mg total) by mouth daily. (Patient not taking: Reported on 10/15/2019) 30 tablet 0   Multiple Vitamin (MULTIVITAMIN WITH MINERALS) TABS tablet Take 1 tablet by mouth daily.     omeprazole (PRILOSEC) 20 MG capsule Take 40 mg by mouth daily.     Polyethyl Glycol-Propyl Glycol (SYSTANE OP) Place 1 drop into both eyes daily as needed (dry eyes).     potassium chloride (K-DUR) 10 MEQ tablet Take 10 mEq by mouth  daily.     propranolol (INDERAL) 10 MG tablet Take 10 mg by mouth 3 (three) times daily.     rivaroxaban (XARELTO) 20 MG TABS tablet Take 1 tablet (20 mg total) by mouth daily.     sertraline (ZOLOFT) 50 MG tablet Take 50 mg by mouth daily.     traMADol (ULTRAM) 50 MG tablet Take 50 mg by mouth every 8 (eight) hours as needed for moderate pain.     traZODone (DESYREL) 50 MG tablet Take 50 mg by mouth at bedtime.     VICTOZA 18 MG/3ML SOPN Inject 0.6 mg into the skin daily at 12 noon.      vitamin E 400 UNIT capsule Take 400 Units by mouth daily.     No current facility-administered medications on file prior to visit.   Allergies  Allergen Reactions   Donepezil Diarrhea   Aspirin Diarrhea    Stomach pain    No results found for this or any previous visit (from the past 2160 hour(s)).  Objective: General: Patient is awake, alert, and oriented x 3 and in no acute distress.  Integument: Skin is warm, dry and supple bilateral. Nails are tender, long, thickened and dystrophic with subungual debris, consistent with onychomycosis, 1-5 bilateral. No signs of infection. No open lesions or preulcerative lesions present bilateral. Dry skin at heels. Remaining integument unremarkable.  Vasculature:  Dorsalis Pedis pulse 1/4 bilateral. Posterior Tibial pulse  0/4 bilateral. Capillary fill time <5 sec 1-5 bilateral. Scant hair growth to the level of the digits. Temperature gradient within normal limits. No varicosities present bilateral. Trace edema present bilateral.   Neurology: The patient has intact sensation measured with a 5.07/10g Semmes Weinstein Monofilament at all pedal sites bilateral . Vibratory sensation diminished bilateral with tuning fork. Unchanged from prior.   Musculoskeletal: Asymptomatic pes planus and hammertoe pedal deformities noted bilateral. Muscular strength 5/5 in all lower extremity muscular groups bilateral without pain on range of motion.  No tenderness with calf  compression bilateral.  Assessment and Plan: Problem List Items Addressed This Visit      Endocrine   Diabetic neuropathy associated with type 2 diabetes mellitus (HCC)    Other Visit Diagnoses    Pain due to onychomycosis of toenails of both feet    -  Primary   PVD (peripheral vascular disease) (HCC)          -Examined patient. -Re-Discussed and educated patient on diabetic foot care, especially with  regards to the vascular, neurological and musculoskeletal systems.  -Mechanically debrided all nails 1-5 bilateral using sterile nail nipper and filed with dremel without incident  -Continue with daily foot cream for dry skin -Answered all patient questions -Patient to return  in 3 months for at risk foot care -Patient advised to call the office if any problems or questions arise in the meantime.  Asencion Islam, DPM

## 2020-07-20 DIAGNOSIS — E119 Type 2 diabetes mellitus without complications: Secondary | ICD-10-CM | POA: Diagnosis not present

## 2020-07-20 DIAGNOSIS — I10 Essential (primary) hypertension: Secondary | ICD-10-CM | POA: Diagnosis not present

## 2020-08-09 DIAGNOSIS — S3991XA Unspecified injury of abdomen, initial encounter: Secondary | ICD-10-CM | POA: Diagnosis not present

## 2020-08-09 DIAGNOSIS — I10 Essential (primary) hypertension: Secondary | ICD-10-CM | POA: Diagnosis not present

## 2020-08-09 DIAGNOSIS — R52 Pain, unspecified: Secondary | ICD-10-CM | POA: Diagnosis not present

## 2020-08-09 DIAGNOSIS — R600 Localized edema: Secondary | ICD-10-CM | POA: Diagnosis not present

## 2020-08-09 DIAGNOSIS — E119 Type 2 diabetes mellitus without complications: Secondary | ICD-10-CM | POA: Diagnosis not present

## 2020-08-09 DIAGNOSIS — R0902 Hypoxemia: Secondary | ICD-10-CM | POA: Diagnosis not present

## 2020-08-09 DIAGNOSIS — K219 Gastro-esophageal reflux disease without esophagitis: Secondary | ICD-10-CM | POA: Diagnosis not present

## 2020-08-09 DIAGNOSIS — M1712 Unilateral primary osteoarthritis, left knee: Secondary | ICD-10-CM | POA: Diagnosis not present

## 2020-08-09 DIAGNOSIS — W19XXXA Unspecified fall, initial encounter: Secondary | ICD-10-CM | POA: Diagnosis not present

## 2020-08-09 DIAGNOSIS — S299XXA Unspecified injury of thorax, initial encounter: Secondary | ICD-10-CM | POA: Diagnosis not present

## 2020-08-09 DIAGNOSIS — E785 Hyperlipidemia, unspecified: Secondary | ICD-10-CM | POA: Diagnosis not present

## 2020-08-09 DIAGNOSIS — M25519 Pain in unspecified shoulder: Secondary | ICD-10-CM | POA: Diagnosis not present

## 2020-08-09 DIAGNOSIS — G908 Other disorders of autonomic nervous system: Secondary | ICD-10-CM | POA: Diagnosis not present

## 2020-08-09 DIAGNOSIS — M5020 Other cervical disc displacement, unspecified cervical region: Secondary | ICD-10-CM | POA: Diagnosis not present

## 2020-08-09 DIAGNOSIS — M5021 Other cervical disc displacement,  high cervical region: Secondary | ICD-10-CM | POA: Diagnosis not present

## 2020-08-09 DIAGNOSIS — N170 Acute kidney failure with tubular necrosis: Secondary | ICD-10-CM | POA: Diagnosis not present

## 2020-08-09 DIAGNOSIS — F418 Other specified anxiety disorders: Secondary | ICD-10-CM | POA: Diagnosis not present

## 2020-08-09 DIAGNOSIS — M25511 Pain in right shoulder: Secondary | ICD-10-CM | POA: Diagnosis not present

## 2020-08-09 DIAGNOSIS — G9389 Other specified disorders of brain: Secondary | ICD-10-CM | POA: Diagnosis not present

## 2020-08-10 DIAGNOSIS — R55 Syncope and collapse: Secondary | ICD-10-CM | POA: Diagnosis not present

## 2020-08-10 DIAGNOSIS — I517 Cardiomegaly: Secondary | ICD-10-CM | POA: Diagnosis not present

## 2020-08-11 DIAGNOSIS — E119 Type 2 diabetes mellitus without complications: Secondary | ICD-10-CM | POA: Diagnosis not present

## 2020-08-11 DIAGNOSIS — K219 Gastro-esophageal reflux disease without esophagitis: Secondary | ICD-10-CM | POA: Diagnosis not present

## 2020-08-11 DIAGNOSIS — I10 Essential (primary) hypertension: Secondary | ICD-10-CM | POA: Diagnosis not present

## 2020-08-11 DIAGNOSIS — R600 Localized edema: Secondary | ICD-10-CM | POA: Diagnosis not present

## 2020-08-11 DIAGNOSIS — F418 Other specified anxiety disorders: Secondary | ICD-10-CM | POA: Diagnosis not present

## 2020-08-11 DIAGNOSIS — N170 Acute kidney failure with tubular necrosis: Secondary | ICD-10-CM | POA: Diagnosis not present

## 2020-08-11 DIAGNOSIS — G908 Other disorders of autonomic nervous system: Secondary | ICD-10-CM | POA: Diagnosis not present

## 2020-08-11 DIAGNOSIS — M5020 Other cervical disc displacement, unspecified cervical region: Secondary | ICD-10-CM | POA: Diagnosis not present

## 2020-08-11 DIAGNOSIS — E785 Hyperlipidemia, unspecified: Secondary | ICD-10-CM | POA: Diagnosis not present

## 2020-08-14 ENCOUNTER — Other Ambulatory Visit: Payer: Self-pay

## 2020-08-14 NOTE — Patient Outreach (Signed)
[facility]) Care Management  08/14/2020  Andrew Winters 1941-03-08 [id number]   Transition of Care Referral  Referral Date: 08/14/2020 Referral Source: [facility] Discharge Report Date of Discharge: 08/11/2020 Facility: [facility] Insurance: [facility]   Voicemail message received from spouse-Sandra. Return call placed. Spoke with spouse(DPR on file). She voices that she handles patient's affairs due to his dementia. She reports that patient is doing fairly well and "coming along pretty good." He was complaining of some paine earlier today. He has pain med in the home and spouse giving med to him and reports some relief. He rescheduled [facility] visit for today until next week due to patient needing to rest. He goes for PCP follow up appt on Monday. Spouse confirms that they have all their meds in the home and no issues or concerns regarding them. She plans to talk with PCP about adjusting nighttime sleep/rest med to help patient sleep longer and not have "nightmares". She denies any [facility] needs or concerns at this time. She does not feel like she need continued calls. She is aware that she can call if needed and voiced appreciation and understanding.      Plan: RN CM will close case at this time.   Antionette Fairy, RN,BSN,CCM [facility] Care Management Telephonic Care Management Coordinator Direct Phone: [phone] Toll Free: [phone] Fax: [phone]

## 2020-08-14 NOTE — Patient Outreach (Signed)
[facility]) Care Management  08/14/2020  Andrew Winters 18-Dec-1940 [id number]     Transition of Care Referral  Referral Date: 08/14/2020 Referral Source: [facility] Discharge Report Date of Discharge: 08/11/2020 Facility: [facility] Insurance: [facility]    Outreach attempt # 1 to patient. No answer. RN CM left HIPAA compliant voicemail message along with contact info.    Plan: RN CM will make outreach attempt to patient within 3-4 business days. RN CM will send unsuccessful outreach letter to patient.   Antionette Fairy, RN,BSN,CCM [facility] Care Management Telephonic Care Management Coordinator Direct Phone: [phone] Toll Free: [phone] Fax: [phone]

## 2020-08-18 ENCOUNTER — Ambulatory Visit: Payer: Medicare HMO | Admitting: Sports Medicine

## 2020-08-26 DIAGNOSIS — K219 Gastro-esophageal reflux disease without esophagitis: Secondary | ICD-10-CM | POA: Diagnosis not present

## 2020-08-26 DIAGNOSIS — I10 Essential (primary) hypertension: Secondary | ICD-10-CM | POA: Diagnosis not present

## 2020-08-26 DIAGNOSIS — M159 Polyosteoarthritis, unspecified: Secondary | ICD-10-CM | POA: Diagnosis not present

## 2020-08-26 DIAGNOSIS — E669 Obesity, unspecified: Secondary | ICD-10-CM | POA: Diagnosis not present

## 2020-08-26 DIAGNOSIS — E1169 Type 2 diabetes mellitus with other specified complication: Secondary | ICD-10-CM | POA: Diagnosis not present

## 2020-08-26 DIAGNOSIS — E7800 Pure hypercholesterolemia, unspecified: Secondary | ICD-10-CM | POA: Diagnosis not present

## 2020-08-26 DIAGNOSIS — N179 Acute kidney failure, unspecified: Secondary | ICD-10-CM | POA: Diagnosis not present

## 2020-08-26 DIAGNOSIS — I951 Orthostatic hypotension: Secondary | ICD-10-CM | POA: Diagnosis not present

## 2020-08-26 DIAGNOSIS — N400 Enlarged prostate without lower urinary tract symptoms: Secondary | ICD-10-CM | POA: Diagnosis not present

## 2020-09-02 DIAGNOSIS — I951 Orthostatic hypotension: Secondary | ICD-10-CM | POA: Diagnosis not present

## 2020-09-02 DIAGNOSIS — E1169 Type 2 diabetes mellitus with other specified complication: Secondary | ICD-10-CM | POA: Diagnosis not present

## 2020-09-02 DIAGNOSIS — N179 Acute kidney failure, unspecified: Secondary | ICD-10-CM | POA: Diagnosis not present

## 2020-09-02 DIAGNOSIS — I10 Essential (primary) hypertension: Secondary | ICD-10-CM | POA: Diagnosis not present

## 2020-09-02 DIAGNOSIS — K219 Gastro-esophageal reflux disease without esophagitis: Secondary | ICD-10-CM | POA: Diagnosis not present

## 2020-09-02 DIAGNOSIS — N400 Enlarged prostate without lower urinary tract symptoms: Secondary | ICD-10-CM | POA: Diagnosis not present

## 2020-09-02 DIAGNOSIS — E669 Obesity, unspecified: Secondary | ICD-10-CM | POA: Diagnosis not present

## 2020-09-02 DIAGNOSIS — E7800 Pure hypercholesterolemia, unspecified: Secondary | ICD-10-CM | POA: Diagnosis not present

## 2020-09-02 DIAGNOSIS — M159 Polyosteoarthritis, unspecified: Secondary | ICD-10-CM | POA: Diagnosis not present

## 2020-09-08 ENCOUNTER — Ambulatory Visit: Payer: Medicare HMO | Admitting: Sports Medicine

## 2020-09-08 DIAGNOSIS — R42 Dizziness and giddiness: Secondary | ICD-10-CM | POA: Diagnosis not present

## 2020-09-08 DIAGNOSIS — R262 Difficulty in walking, not elsewhere classified: Secondary | ICD-10-CM | POA: Diagnosis not present

## 2020-09-09 DIAGNOSIS — N400 Enlarged prostate without lower urinary tract symptoms: Secondary | ICD-10-CM | POA: Diagnosis not present

## 2020-09-09 DIAGNOSIS — K219 Gastro-esophageal reflux disease without esophagitis: Secondary | ICD-10-CM | POA: Diagnosis not present

## 2020-09-09 DIAGNOSIS — I10 Essential (primary) hypertension: Secondary | ICD-10-CM | POA: Diagnosis not present

## 2020-09-09 DIAGNOSIS — N179 Acute kidney failure, unspecified: Secondary | ICD-10-CM | POA: Diagnosis not present

## 2020-09-09 DIAGNOSIS — E669 Obesity, unspecified: Secondary | ICD-10-CM | POA: Diagnosis not present

## 2020-09-09 DIAGNOSIS — M159 Polyosteoarthritis, unspecified: Secondary | ICD-10-CM | POA: Diagnosis not present

## 2020-09-09 DIAGNOSIS — E7800 Pure hypercholesterolemia, unspecified: Secondary | ICD-10-CM | POA: Diagnosis not present

## 2020-09-09 DIAGNOSIS — E1169 Type 2 diabetes mellitus with other specified complication: Secondary | ICD-10-CM | POA: Diagnosis not present

## 2020-09-09 DIAGNOSIS — I951 Orthostatic hypotension: Secondary | ICD-10-CM | POA: Diagnosis not present

## 2020-09-11 DIAGNOSIS — K219 Gastro-esophageal reflux disease without esophagitis: Secondary | ICD-10-CM | POA: Diagnosis not present

## 2020-09-11 DIAGNOSIS — I951 Orthostatic hypotension: Secondary | ICD-10-CM | POA: Diagnosis not present

## 2020-09-11 DIAGNOSIS — N400 Enlarged prostate without lower urinary tract symptoms: Secondary | ICD-10-CM | POA: Diagnosis not present

## 2020-09-11 DIAGNOSIS — M159 Polyosteoarthritis, unspecified: Secondary | ICD-10-CM | POA: Diagnosis not present

## 2020-09-11 DIAGNOSIS — E669 Obesity, unspecified: Secondary | ICD-10-CM | POA: Diagnosis not present

## 2020-09-11 DIAGNOSIS — E1169 Type 2 diabetes mellitus with other specified complication: Secondary | ICD-10-CM | POA: Diagnosis not present

## 2020-09-11 DIAGNOSIS — E7800 Pure hypercholesterolemia, unspecified: Secondary | ICD-10-CM | POA: Diagnosis not present

## 2020-09-11 DIAGNOSIS — I10 Essential (primary) hypertension: Secondary | ICD-10-CM | POA: Diagnosis not present

## 2020-09-11 DIAGNOSIS — N179 Acute kidney failure, unspecified: Secondary | ICD-10-CM | POA: Diagnosis not present

## 2020-09-14 DIAGNOSIS — N400 Enlarged prostate without lower urinary tract symptoms: Secondary | ICD-10-CM | POA: Diagnosis not present

## 2020-09-14 DIAGNOSIS — N179 Acute kidney failure, unspecified: Secondary | ICD-10-CM | POA: Diagnosis not present

## 2020-09-14 DIAGNOSIS — K219 Gastro-esophageal reflux disease without esophagitis: Secondary | ICD-10-CM | POA: Diagnosis not present

## 2020-09-14 DIAGNOSIS — E7800 Pure hypercholesterolemia, unspecified: Secondary | ICD-10-CM | POA: Diagnosis not present

## 2020-09-14 DIAGNOSIS — I10 Essential (primary) hypertension: Secondary | ICD-10-CM | POA: Diagnosis not present

## 2020-09-14 DIAGNOSIS — M159 Polyosteoarthritis, unspecified: Secondary | ICD-10-CM | POA: Diagnosis not present

## 2020-09-14 DIAGNOSIS — E1169 Type 2 diabetes mellitus with other specified complication: Secondary | ICD-10-CM | POA: Diagnosis not present

## 2020-09-14 DIAGNOSIS — I951 Orthostatic hypotension: Secondary | ICD-10-CM | POA: Diagnosis not present

## 2020-09-14 DIAGNOSIS — E669 Obesity, unspecified: Secondary | ICD-10-CM | POA: Diagnosis not present

## 2020-09-15 ENCOUNTER — Other Ambulatory Visit: Payer: Self-pay

## 2020-09-15 ENCOUNTER — Encounter: Payer: Self-pay | Admitting: Sports Medicine

## 2020-09-15 ENCOUNTER — Ambulatory Visit: Payer: Medicare HMO | Admitting: Sports Medicine

## 2020-09-15 DIAGNOSIS — M79674 Pain in right toe(s): Secondary | ICD-10-CM | POA: Diagnosis not present

## 2020-09-15 DIAGNOSIS — B351 Tinea unguium: Secondary | ICD-10-CM | POA: Diagnosis not present

## 2020-09-15 DIAGNOSIS — M79675 Pain in left toe(s): Secondary | ICD-10-CM | POA: Diagnosis not present

## 2020-09-15 DIAGNOSIS — E1142 Type 2 diabetes mellitus with diabetic polyneuropathy: Secondary | ICD-10-CM | POA: Diagnosis not present

## 2020-09-15 DIAGNOSIS — I739 Peripheral vascular disease, unspecified: Secondary | ICD-10-CM

## 2020-09-15 NOTE — Progress Notes (Signed)
Subjective: Andrew Winters is a 80 y.o. male patient with history of diabetes who presents to office today complaining of long,mildly painful nails  while ambulating in shoes; unable to trim. Reports that he was in hospital last month for a fall. Wife reports that he has PT/OT now at home since discharge.   FBS 89 A1c unknown PCP Dr. Debbe Bales x Feb  Patient Active Problem List   Diagnosis Date Noted  . Severe sepsis (HCC) 09/26/2017  . UTI (urinary tract infection) 09/26/2017  . Abnormal loss of weight   . History of snoring 09/20/2016  . Chronic diarrhea 09/20/2016  . Urinary tract infection without hematuria   . Vascular dementia without behavioral disturbance (HCC)   . Hypokalemia   . Dysphagia   . Chest pain 09/11/2016  . Pulmonary emboli (HCC) 05/20/2016  . Acute saddle pulmonary embolism without acute cor pulmonale (HCC) 05/20/2016  . Pseudogout of right knee 01/13/2015  . DVT of upper extremity (deep vein thrombosis): LEFT 01/11/2015  . Anemia, iron deficiency 01/11/2015  . Postoperative fever   . Fever 01/08/2015  . Diabetes mellitus type 2, controlled (HCC) 01/07/2015  . Essential hypertension 01/07/2015  . SIRS (systemic inflammatory response syndrome) (HCC) 01/07/2015  . Acute encephalopathy   . Primary osteoarthritis of right knee 01/06/2015  . Diabetes (HCC) 01/06/2015  . Morbid obesity (HCC) 01/06/2015  . Diabetic neuropathy associated with type 2 diabetes mellitus (HCC) 01/05/2010  . HEADACHE 05/07/2009  . UNSPECIFIED VITAMIN D DEFICIENCY 02/03/2009  . LIVER FUNCTION TESTS, ABNORMAL 07/19/2007  . HYPERTHYROIDISM 01/04/2007  . DIABETES MELLITUS, TYPE II 01/04/2007  . HYPERLIPIDEMIA 01/04/2007  . ERECTILE DYSFUNCTION 01/04/2007  . DEPRESSION 01/04/2007  . GERD 01/04/2007  . BENIGN PROSTATIC HYPERTROPHY 01/04/2007  . OSTEOARTHRITIS 01/04/2007   Current Outpatient Medications on File Prior to Visit  Medication Sig Dispense Refill  . acetaminophen (TYLENOL)  500 MG tablet Take 1,000 mg by mouth daily as needed for mild pain, moderate pain or headache.     Marland Kitchen atorvastatin (LIPITOR) 40 MG tablet Take 40 mg by mouth at bedtime.     . bismuth subsalicylate (PEPTO BISMOL) 262 MG/15ML suspension Take 30 mLs by mouth every 6 (six) hours as needed for indigestion or diarrhea or loose stools.    . cetirizine (ZYRTEC) 10 MG tablet Take 10 mg by mouth daily.    . cholecalciferol (VITAMIN D) 1000 units tablet Take 1,000 Units by mouth daily.     Marland Kitchen dextromethorphan-guaiFENesin (MUCINEX DM) 30-600 MG 12hr tablet Take 1 tablet by mouth 2 (two) times daily.    Marland Kitchen diltiazem (CARDIZEM) 60 MG tablet Take 60 mg by mouth in the morning and at bedtime.    . ferrous sulfate 325 (65 FE) MG tablet Take 325 mg by mouth daily with breakfast.    . furosemide (LASIX) 20 MG tablet Take 1 tablet (20 mg total) by mouth daily.    Marland Kitchen gabapentin (NEURONTIN) 100 MG capsule Take 1-2 capsules (100-200 mg total) by mouth See admin instructions. 100mg  in the morning and at noon and 200mg  at bedtime (Patient taking differently: Take 100-200 mg by mouth See admin instructions. 100mg  in the morning, 100 mg in the evening, and 200mg  at bedtime)    . glimepiride (AMARYL) 4 MG tablet Take 4 mg by mouth 2 (two) times daily.     lisinopril (PRINIVIL,ZESTRIL) 20 MG tablet Take 20 mg by mouth daily.    lisinopril (ZESTRIL) 30 MG tablet Take 30 mg by mouth daily.     loperamide (IMODIUM) 1 MG/5ML solution Take 3 mg by mouth as needed for diarrhea or loose stools.    . memantine (NAMENDA) 10 MG tablet Take 10 mg by mouth 2 (two) times daily.    . memantine (NAMENDA) 5 MG tablet Take 1 tablet (5 mg total) by mouth daily. (Patient not taking: Reported on 10/15/2019) 30 tablet 0  . Multiple Vitamin (MULTIVITAMIN WITH MINERALS) TABS tablet Take 1 tablet by mouth daily.    Marland Kitchen omeprazole (PRILOSEC) 20 MG capsule Take 40 mg by mouth daily.    Bertram Gala Glycol-Propyl Glycol (SYSTANE OP) Place 1 drop into both  eyes daily as needed (dry eyes).    . potassium chloride (K-DUR) 10 MEQ tablet Take 10 mEq by mouth daily.    . propranolol (INDERAL) 10 MG tablet Take 10 mg by mouth 3 (three) times daily.    . rivaroxaban (XARELTO) 20 MG TABS tablet Take 1 tablet (20 mg total) by mouth daily.    . sertraline (ZOLOFT) 50 MG tablet Take 50 mg by mouth daily.    . traMADol (ULTRAM) 50 MG tablet Take 50 mg by mouth every 8 (eight) hours as needed for moderate pain.    . traZODone (DESYREL) 50 MG tablet Take 50 mg by mouth at bedtime.    Marland Kitchen VICTOZA 18 MG/3ML SOPN Inject 0.6 mg into the skin daily at 12 noon.     . vitamin E 400 UNIT capsule Take 400 Units by mouth daily.     No current facility-administered medications on file prior to visit.   Allergies  Allergen Reactions  . Donepezil Diarrhea  . Aspirin Diarrhea    Stomach pain    No results found for this or any previous visit (from the past 2160 hour(s)).  Objective: General: Patient is awake, alert, and oriented x 3 and in no acute distress.  Integument: Skin is warm, dry and supple bilateral. Nails are tender, long, thickened and dystrophic with subungual debris, consistent with onychomycosis, 1-5 bilateral. No signs of infection. No open lesions or preulcerative lesions present bilateral. Mild dry skin at heels. Remaining integument unremarkable.  Vasculature:  Dorsalis Pedis pulse 1/4 bilateral. Posterior Tibial pulse  0/4 bilateral. Capillary fill time <5 sec 1-5 bilateral. Scant hair growth to the level of the digits. Temperature gradient within normal limits. No varicosities present bilateral. Trace edema present bilateral.   Neurology: The patient has intact sensation measured with a 5.07/10g Semmes Weinstein Monofilament at all pedal sites bilateral . Vibratory sensation diminished bilateral with tuning fork. Unchanged from prior.   Musculoskeletal: Asymptomatic pes planus and hammertoe pedal deformities noted bilateral. Muscular strength 5/5  in all lower extremity muscular groups bilateral without pain on range of motion.  No tenderness with calf compression bilateral.  Assessment and Plan: Problem List Items Addressed This Visit      Endocrine   Diabetic neuropathy associated with type 2 diabetes mellitus (HCC)    Other Visit Diagnoses    Pain due to onychomycosis of toenails of both feet    -  Primary   PVD (peripheral vascular disease) (HCC)          -Examined patient. -Re-Discussed and educated patient on diabetic foot care, especially with  regards to the vascular, neurological and musculoskeletal systems.  -Mechanically debrided all nails 1-5 bilateral using sterile nail nipper and filed with dremel without incident  -Encouraged continue with daily foot cream for dry skin -Answered all patient questions -Patient to return  in 3 months for  at risk foot care -Patient advised to call the office if any problems or questions arise in the meantime.  Landis Martins, DPM

## 2020-09-16 DIAGNOSIS — I951 Orthostatic hypotension: Secondary | ICD-10-CM | POA: Diagnosis not present

## 2020-09-16 DIAGNOSIS — E669 Obesity, unspecified: Secondary | ICD-10-CM | POA: Diagnosis not present

## 2020-09-16 DIAGNOSIS — N400 Enlarged prostate without lower urinary tract symptoms: Secondary | ICD-10-CM | POA: Diagnosis not present

## 2020-09-16 DIAGNOSIS — K219 Gastro-esophageal reflux disease without esophagitis: Secondary | ICD-10-CM | POA: Diagnosis not present

## 2020-09-16 DIAGNOSIS — N179 Acute kidney failure, unspecified: Secondary | ICD-10-CM | POA: Diagnosis not present

## 2020-09-16 DIAGNOSIS — M159 Polyosteoarthritis, unspecified: Secondary | ICD-10-CM | POA: Diagnosis not present

## 2020-09-16 DIAGNOSIS — E1169 Type 2 diabetes mellitus with other specified complication: Secondary | ICD-10-CM | POA: Diagnosis not present

## 2020-09-16 DIAGNOSIS — E7800 Pure hypercholesterolemia, unspecified: Secondary | ICD-10-CM | POA: Diagnosis not present

## 2020-09-16 DIAGNOSIS — I10 Essential (primary) hypertension: Secondary | ICD-10-CM | POA: Diagnosis not present

## 2020-09-22 DIAGNOSIS — E7800 Pure hypercholesterolemia, unspecified: Secondary | ICD-10-CM | POA: Diagnosis not present

## 2020-09-22 DIAGNOSIS — I10 Essential (primary) hypertension: Secondary | ICD-10-CM | POA: Diagnosis not present

## 2020-09-22 DIAGNOSIS — M159 Polyosteoarthritis, unspecified: Secondary | ICD-10-CM | POA: Diagnosis not present

## 2020-09-22 DIAGNOSIS — N400 Enlarged prostate without lower urinary tract symptoms: Secondary | ICD-10-CM | POA: Diagnosis not present

## 2020-09-22 DIAGNOSIS — E669 Obesity, unspecified: Secondary | ICD-10-CM | POA: Diagnosis not present

## 2020-09-22 DIAGNOSIS — E1169 Type 2 diabetes mellitus with other specified complication: Secondary | ICD-10-CM | POA: Diagnosis not present

## 2020-09-22 DIAGNOSIS — I951 Orthostatic hypotension: Secondary | ICD-10-CM | POA: Diagnosis not present

## 2020-09-22 DIAGNOSIS — K219 Gastro-esophageal reflux disease without esophagitis: Secondary | ICD-10-CM | POA: Diagnosis not present

## 2020-09-22 DIAGNOSIS — N179 Acute kidney failure, unspecified: Secondary | ICD-10-CM | POA: Diagnosis not present

## 2020-09-23 DIAGNOSIS — N400 Enlarged prostate without lower urinary tract symptoms: Secondary | ICD-10-CM | POA: Diagnosis not present

## 2020-09-23 DIAGNOSIS — I951 Orthostatic hypotension: Secondary | ICD-10-CM | POA: Diagnosis not present

## 2020-09-23 DIAGNOSIS — K219 Gastro-esophageal reflux disease without esophagitis: Secondary | ICD-10-CM | POA: Diagnosis not present

## 2020-09-23 DIAGNOSIS — N179 Acute kidney failure, unspecified: Secondary | ICD-10-CM | POA: Diagnosis not present

## 2020-09-23 DIAGNOSIS — E669 Obesity, unspecified: Secondary | ICD-10-CM | POA: Diagnosis not present

## 2020-09-23 DIAGNOSIS — I10 Essential (primary) hypertension: Secondary | ICD-10-CM | POA: Diagnosis not present

## 2020-09-23 DIAGNOSIS — E1169 Type 2 diabetes mellitus with other specified complication: Secondary | ICD-10-CM | POA: Diagnosis not present

## 2020-09-23 DIAGNOSIS — M159 Polyosteoarthritis, unspecified: Secondary | ICD-10-CM | POA: Diagnosis not present

## 2020-09-23 DIAGNOSIS — E7800 Pure hypercholesterolemia, unspecified: Secondary | ICD-10-CM | POA: Diagnosis not present

## 2020-09-25 DIAGNOSIS — I951 Orthostatic hypotension: Secondary | ICD-10-CM | POA: Diagnosis not present

## 2020-09-25 DIAGNOSIS — E7800 Pure hypercholesterolemia, unspecified: Secondary | ICD-10-CM | POA: Diagnosis not present

## 2020-09-25 DIAGNOSIS — M159 Polyosteoarthritis, unspecified: Secondary | ICD-10-CM | POA: Diagnosis not present

## 2020-09-25 DIAGNOSIS — N400 Enlarged prostate without lower urinary tract symptoms: Secondary | ICD-10-CM | POA: Diagnosis not present

## 2020-09-25 DIAGNOSIS — K219 Gastro-esophageal reflux disease without esophagitis: Secondary | ICD-10-CM | POA: Diagnosis not present

## 2020-09-25 DIAGNOSIS — E669 Obesity, unspecified: Secondary | ICD-10-CM | POA: Diagnosis not present

## 2020-09-25 DIAGNOSIS — E1169 Type 2 diabetes mellitus with other specified complication: Secondary | ICD-10-CM | POA: Diagnosis not present

## 2020-09-25 DIAGNOSIS — N179 Acute kidney failure, unspecified: Secondary | ICD-10-CM | POA: Diagnosis not present

## 2020-09-25 DIAGNOSIS — I10 Essential (primary) hypertension: Secondary | ICD-10-CM | POA: Diagnosis not present

## 2020-09-28 DIAGNOSIS — K219 Gastro-esophageal reflux disease without esophagitis: Secondary | ICD-10-CM | POA: Diagnosis not present

## 2020-09-28 DIAGNOSIS — E1169 Type 2 diabetes mellitus with other specified complication: Secondary | ICD-10-CM | POA: Diagnosis not present

## 2020-09-28 DIAGNOSIS — I951 Orthostatic hypotension: Secondary | ICD-10-CM | POA: Diagnosis not present

## 2020-09-28 DIAGNOSIS — N400 Enlarged prostate without lower urinary tract symptoms: Secondary | ICD-10-CM | POA: Diagnosis not present

## 2020-09-28 DIAGNOSIS — E7800 Pure hypercholesterolemia, unspecified: Secondary | ICD-10-CM | POA: Diagnosis not present

## 2020-09-28 DIAGNOSIS — I10 Essential (primary) hypertension: Secondary | ICD-10-CM | POA: Diagnosis not present

## 2020-09-28 DIAGNOSIS — N179 Acute kidney failure, unspecified: Secondary | ICD-10-CM | POA: Diagnosis not present

## 2020-09-28 DIAGNOSIS — E669 Obesity, unspecified: Secondary | ICD-10-CM | POA: Diagnosis not present

## 2020-09-28 DIAGNOSIS — M159 Polyosteoarthritis, unspecified: Secondary | ICD-10-CM | POA: Diagnosis not present

## 2020-10-01 DIAGNOSIS — I10 Essential (primary) hypertension: Secondary | ICD-10-CM | POA: Diagnosis not present

## 2020-10-01 DIAGNOSIS — M159 Polyosteoarthritis, unspecified: Secondary | ICD-10-CM | POA: Diagnosis not present

## 2020-10-01 DIAGNOSIS — I951 Orthostatic hypotension: Secondary | ICD-10-CM | POA: Diagnosis not present

## 2020-10-01 DIAGNOSIS — N179 Acute kidney failure, unspecified: Secondary | ICD-10-CM | POA: Diagnosis not present

## 2020-10-01 DIAGNOSIS — N400 Enlarged prostate without lower urinary tract symptoms: Secondary | ICD-10-CM | POA: Diagnosis not present

## 2020-10-01 DIAGNOSIS — K219 Gastro-esophageal reflux disease without esophagitis: Secondary | ICD-10-CM | POA: Diagnosis not present

## 2020-10-01 DIAGNOSIS — E669 Obesity, unspecified: Secondary | ICD-10-CM | POA: Diagnosis not present

## 2020-10-01 DIAGNOSIS — E7800 Pure hypercholesterolemia, unspecified: Secondary | ICD-10-CM | POA: Diagnosis not present

## 2020-10-01 DIAGNOSIS — E1169 Type 2 diabetes mellitus with other specified complication: Secondary | ICD-10-CM | POA: Diagnosis not present

## 2020-10-05 DIAGNOSIS — M159 Polyosteoarthritis, unspecified: Secondary | ICD-10-CM | POA: Diagnosis not present

## 2020-10-05 DIAGNOSIS — I10 Essential (primary) hypertension: Secondary | ICD-10-CM | POA: Diagnosis not present

## 2020-10-05 DIAGNOSIS — E1169 Type 2 diabetes mellitus with other specified complication: Secondary | ICD-10-CM | POA: Diagnosis not present

## 2020-10-05 DIAGNOSIS — E7800 Pure hypercholesterolemia, unspecified: Secondary | ICD-10-CM | POA: Diagnosis not present

## 2020-10-05 DIAGNOSIS — K219 Gastro-esophageal reflux disease without esophagitis: Secondary | ICD-10-CM | POA: Diagnosis not present

## 2020-10-05 DIAGNOSIS — I951 Orthostatic hypotension: Secondary | ICD-10-CM | POA: Diagnosis not present

## 2020-10-05 DIAGNOSIS — N179 Acute kidney failure, unspecified: Secondary | ICD-10-CM | POA: Diagnosis not present

## 2020-10-05 DIAGNOSIS — E669 Obesity, unspecified: Secondary | ICD-10-CM | POA: Diagnosis not present

## 2020-10-05 DIAGNOSIS — N400 Enlarged prostate without lower urinary tract symptoms: Secondary | ICD-10-CM | POA: Diagnosis not present

## 2020-10-08 DIAGNOSIS — N400 Enlarged prostate without lower urinary tract symptoms: Secondary | ICD-10-CM | POA: Diagnosis not present

## 2020-10-08 DIAGNOSIS — E7800 Pure hypercholesterolemia, unspecified: Secondary | ICD-10-CM | POA: Diagnosis not present

## 2020-10-08 DIAGNOSIS — E669 Obesity, unspecified: Secondary | ICD-10-CM | POA: Diagnosis not present

## 2020-10-08 DIAGNOSIS — I10 Essential (primary) hypertension: Secondary | ICD-10-CM | POA: Diagnosis not present

## 2020-10-08 DIAGNOSIS — N179 Acute kidney failure, unspecified: Secondary | ICD-10-CM | POA: Diagnosis not present

## 2020-10-08 DIAGNOSIS — K219 Gastro-esophageal reflux disease without esophagitis: Secondary | ICD-10-CM | POA: Diagnosis not present

## 2020-10-08 DIAGNOSIS — M159 Polyosteoarthritis, unspecified: Secondary | ICD-10-CM | POA: Diagnosis not present

## 2020-10-08 DIAGNOSIS — I951 Orthostatic hypotension: Secondary | ICD-10-CM | POA: Diagnosis not present

## 2020-10-08 DIAGNOSIS — E1169 Type 2 diabetes mellitus with other specified complication: Secondary | ICD-10-CM | POA: Diagnosis not present

## 2020-10-13 DIAGNOSIS — N400 Enlarged prostate without lower urinary tract symptoms: Secondary | ICD-10-CM | POA: Diagnosis not present

## 2020-10-13 DIAGNOSIS — M159 Polyosteoarthritis, unspecified: Secondary | ICD-10-CM | POA: Diagnosis not present

## 2020-10-13 DIAGNOSIS — E669 Obesity, unspecified: Secondary | ICD-10-CM | POA: Diagnosis not present

## 2020-10-13 DIAGNOSIS — E7800 Pure hypercholesterolemia, unspecified: Secondary | ICD-10-CM | POA: Diagnosis not present

## 2020-10-13 DIAGNOSIS — K219 Gastro-esophageal reflux disease without esophagitis: Secondary | ICD-10-CM | POA: Diagnosis not present

## 2020-10-13 DIAGNOSIS — I951 Orthostatic hypotension: Secondary | ICD-10-CM | POA: Diagnosis not present

## 2020-10-13 DIAGNOSIS — N179 Acute kidney failure, unspecified: Secondary | ICD-10-CM | POA: Diagnosis not present

## 2020-10-13 DIAGNOSIS — E1169 Type 2 diabetes mellitus with other specified complication: Secondary | ICD-10-CM | POA: Diagnosis not present

## 2020-10-13 DIAGNOSIS — I10 Essential (primary) hypertension: Secondary | ICD-10-CM | POA: Diagnosis not present

## 2020-10-16 DIAGNOSIS — I10 Essential (primary) hypertension: Secondary | ICD-10-CM | POA: Diagnosis not present

## 2020-10-16 DIAGNOSIS — N400 Enlarged prostate without lower urinary tract symptoms: Secondary | ICD-10-CM | POA: Diagnosis not present

## 2020-10-16 DIAGNOSIS — E669 Obesity, unspecified: Secondary | ICD-10-CM | POA: Diagnosis not present

## 2020-10-16 DIAGNOSIS — E1169 Type 2 diabetes mellitus with other specified complication: Secondary | ICD-10-CM | POA: Diagnosis not present

## 2020-10-16 DIAGNOSIS — E7800 Pure hypercholesterolemia, unspecified: Secondary | ICD-10-CM | POA: Diagnosis not present

## 2020-10-16 DIAGNOSIS — N179 Acute kidney failure, unspecified: Secondary | ICD-10-CM | POA: Diagnosis not present

## 2020-10-16 DIAGNOSIS — M159 Polyosteoarthritis, unspecified: Secondary | ICD-10-CM | POA: Diagnosis not present

## 2020-10-16 DIAGNOSIS — K219 Gastro-esophageal reflux disease without esophagitis: Secondary | ICD-10-CM | POA: Diagnosis not present

## 2020-10-16 DIAGNOSIS — I951 Orthostatic hypotension: Secondary | ICD-10-CM | POA: Diagnosis not present

## 2020-10-20 DIAGNOSIS — E1169 Type 2 diabetes mellitus with other specified complication: Secondary | ICD-10-CM | POA: Diagnosis not present

## 2020-10-20 DIAGNOSIS — N179 Acute kidney failure, unspecified: Secondary | ICD-10-CM | POA: Diagnosis not present

## 2020-10-20 DIAGNOSIS — K219 Gastro-esophageal reflux disease without esophagitis: Secondary | ICD-10-CM | POA: Diagnosis not present

## 2020-10-20 DIAGNOSIS — E669 Obesity, unspecified: Secondary | ICD-10-CM | POA: Diagnosis not present

## 2020-10-20 DIAGNOSIS — E7800 Pure hypercholesterolemia, unspecified: Secondary | ICD-10-CM | POA: Diagnosis not present

## 2020-10-20 DIAGNOSIS — N400 Enlarged prostate without lower urinary tract symptoms: Secondary | ICD-10-CM | POA: Diagnosis not present

## 2020-10-20 DIAGNOSIS — I951 Orthostatic hypotension: Secondary | ICD-10-CM | POA: Diagnosis not present

## 2020-10-20 DIAGNOSIS — M159 Polyosteoarthritis, unspecified: Secondary | ICD-10-CM | POA: Diagnosis not present

## 2020-10-20 DIAGNOSIS — I10 Essential (primary) hypertension: Secondary | ICD-10-CM | POA: Diagnosis not present

## 2020-10-21 DIAGNOSIS — M159 Polyosteoarthritis, unspecified: Secondary | ICD-10-CM | POA: Diagnosis not present

## 2020-10-21 DIAGNOSIS — E669 Obesity, unspecified: Secondary | ICD-10-CM | POA: Diagnosis not present

## 2020-10-21 DIAGNOSIS — N400 Enlarged prostate without lower urinary tract symptoms: Secondary | ICD-10-CM | POA: Diagnosis not present

## 2020-10-21 DIAGNOSIS — E7800 Pure hypercholesterolemia, unspecified: Secondary | ICD-10-CM | POA: Diagnosis not present

## 2020-10-21 DIAGNOSIS — E1169 Type 2 diabetes mellitus with other specified complication: Secondary | ICD-10-CM | POA: Diagnosis not present

## 2020-10-21 DIAGNOSIS — I10 Essential (primary) hypertension: Secondary | ICD-10-CM | POA: Diagnosis not present

## 2020-10-21 DIAGNOSIS — N179 Acute kidney failure, unspecified: Secondary | ICD-10-CM | POA: Diagnosis not present

## 2020-10-21 DIAGNOSIS — K219 Gastro-esophageal reflux disease without esophagitis: Secondary | ICD-10-CM | POA: Diagnosis not present

## 2020-10-21 DIAGNOSIS — I951 Orthostatic hypotension: Secondary | ICD-10-CM | POA: Diagnosis not present

## 2020-10-25 DIAGNOSIS — I951 Orthostatic hypotension: Secondary | ICD-10-CM | POA: Diagnosis not present

## 2020-10-25 DIAGNOSIS — E1169 Type 2 diabetes mellitus with other specified complication: Secondary | ICD-10-CM | POA: Diagnosis not present

## 2020-10-25 DIAGNOSIS — K219 Gastro-esophageal reflux disease without esophagitis: Secondary | ICD-10-CM | POA: Diagnosis not present

## 2020-10-25 DIAGNOSIS — E669 Obesity, unspecified: Secondary | ICD-10-CM | POA: Diagnosis not present

## 2020-10-25 DIAGNOSIS — N179 Acute kidney failure, unspecified: Secondary | ICD-10-CM | POA: Diagnosis not present

## 2020-10-25 DIAGNOSIS — M159 Polyosteoarthritis, unspecified: Secondary | ICD-10-CM | POA: Diagnosis not present

## 2020-10-25 DIAGNOSIS — E7800 Pure hypercholesterolemia, unspecified: Secondary | ICD-10-CM | POA: Diagnosis not present

## 2020-10-25 DIAGNOSIS — I10 Essential (primary) hypertension: Secondary | ICD-10-CM | POA: Diagnosis not present

## 2020-10-25 DIAGNOSIS — N400 Enlarged prostate without lower urinary tract symptoms: Secondary | ICD-10-CM | POA: Diagnosis not present

## 2020-10-30 DIAGNOSIS — E669 Obesity, unspecified: Secondary | ICD-10-CM | POA: Diagnosis not present

## 2020-10-30 DIAGNOSIS — I10 Essential (primary) hypertension: Secondary | ICD-10-CM | POA: Diagnosis not present

## 2020-10-30 DIAGNOSIS — E1169 Type 2 diabetes mellitus with other specified complication: Secondary | ICD-10-CM | POA: Diagnosis not present

## 2020-10-30 DIAGNOSIS — M159 Polyosteoarthritis, unspecified: Secondary | ICD-10-CM | POA: Diagnosis not present

## 2020-10-30 DIAGNOSIS — N179 Acute kidney failure, unspecified: Secondary | ICD-10-CM | POA: Diagnosis not present

## 2020-10-30 DIAGNOSIS — I951 Orthostatic hypotension: Secondary | ICD-10-CM | POA: Diagnosis not present

## 2020-10-30 DIAGNOSIS — N400 Enlarged prostate without lower urinary tract symptoms: Secondary | ICD-10-CM | POA: Diagnosis not present

## 2020-10-30 DIAGNOSIS — K219 Gastro-esophageal reflux disease without esophagitis: Secondary | ICD-10-CM | POA: Diagnosis not present

## 2020-10-30 DIAGNOSIS — E7800 Pure hypercholesterolemia, unspecified: Secondary | ICD-10-CM | POA: Diagnosis not present

## 2020-11-05 DIAGNOSIS — E669 Obesity, unspecified: Secondary | ICD-10-CM | POA: Diagnosis not present

## 2020-11-05 DIAGNOSIS — I951 Orthostatic hypotension: Secondary | ICD-10-CM | POA: Diagnosis not present

## 2020-11-05 DIAGNOSIS — M159 Polyosteoarthritis, unspecified: Secondary | ICD-10-CM | POA: Diagnosis not present

## 2020-11-05 DIAGNOSIS — I10 Essential (primary) hypertension: Secondary | ICD-10-CM | POA: Diagnosis not present

## 2020-11-05 DIAGNOSIS — N179 Acute kidney failure, unspecified: Secondary | ICD-10-CM | POA: Diagnosis not present

## 2020-11-05 DIAGNOSIS — N400 Enlarged prostate without lower urinary tract symptoms: Secondary | ICD-10-CM | POA: Diagnosis not present

## 2020-11-05 DIAGNOSIS — K219 Gastro-esophageal reflux disease without esophagitis: Secondary | ICD-10-CM | POA: Diagnosis not present

## 2020-11-05 DIAGNOSIS — E7800 Pure hypercholesterolemia, unspecified: Secondary | ICD-10-CM | POA: Diagnosis not present

## 2020-11-05 DIAGNOSIS — E1169 Type 2 diabetes mellitus with other specified complication: Secondary | ICD-10-CM | POA: Diagnosis not present

## 2020-11-06 ENCOUNTER — Ambulatory Visit: Payer: Medicare HMO | Admitting: Gastroenterology

## 2020-11-09 DIAGNOSIS — M159 Polyosteoarthritis, unspecified: Secondary | ICD-10-CM | POA: Diagnosis not present

## 2020-11-09 DIAGNOSIS — E7800 Pure hypercholesterolemia, unspecified: Secondary | ICD-10-CM | POA: Diagnosis not present

## 2020-11-09 DIAGNOSIS — K219 Gastro-esophageal reflux disease without esophagitis: Secondary | ICD-10-CM | POA: Diagnosis not present

## 2020-11-09 DIAGNOSIS — I951 Orthostatic hypotension: Secondary | ICD-10-CM | POA: Diagnosis not present

## 2020-11-09 DIAGNOSIS — E669 Obesity, unspecified: Secondary | ICD-10-CM | POA: Diagnosis not present

## 2020-11-09 DIAGNOSIS — N400 Enlarged prostate without lower urinary tract symptoms: Secondary | ICD-10-CM | POA: Diagnosis not present

## 2020-11-09 DIAGNOSIS — N179 Acute kidney failure, unspecified: Secondary | ICD-10-CM | POA: Diagnosis not present

## 2020-11-09 DIAGNOSIS — E1169 Type 2 diabetes mellitus with other specified complication: Secondary | ICD-10-CM | POA: Diagnosis not present

## 2020-11-09 DIAGNOSIS — I10 Essential (primary) hypertension: Secondary | ICD-10-CM | POA: Diagnosis not present

## 2020-11-16 ENCOUNTER — Ambulatory Visit: Payer: Medicare HMO | Admitting: Gastroenterology

## 2020-11-16 ENCOUNTER — Encounter: Payer: Self-pay | Admitting: Gastroenterology

## 2020-11-16 VITALS — BP 130/74 | HR 64 | Ht 73.0 in | Wt 272.6 lb

## 2020-11-16 DIAGNOSIS — K220 Achalasia of cardia: Secondary | ICD-10-CM | POA: Diagnosis not present

## 2020-11-16 DIAGNOSIS — Z7901 Long term (current) use of anticoagulants: Secondary | ICD-10-CM

## 2020-11-16 DIAGNOSIS — R1033 Periumbilical pain: Secondary | ICD-10-CM

## 2020-11-16 DIAGNOSIS — R1319 Other dysphagia: Secondary | ICD-10-CM | POA: Diagnosis not present

## 2020-11-16 NOTE — H&P (View-Only) (Signed)
[facility] Progress Note  Chief Complaint: Achalasia  Subjective  History: Mr. Andrew Winters was last seen for his achalasia in the office on October 04, 2019, and underwent EGD with LES Botox injection on 10/18/2019. He is here with his wife, who gives the history because of Kenaz' dementia. He continues to have vague periumbilical abdominal pain, which has been present for years.  It bothers him on and off and is difficult to characterize.  Umbilical hernia with nonincarcerated bowel was seen on a 2017 CT scan.  Dysphagia has worsened in the last few months, giving trouble with both solids and liquids that he might have to bring back up.  They were unable to make prior office visit because he was having difficulty with dizziness, and his wife says that primary care has been adjusting his blood pressure medicines for that problem.  Problem seems to be steadily worsening.  She also believes he is losing some weight as a result.  ROS: Cardiovascular:  no chest pain Respiratory: no dyspnea Remainder of systems negative as near as can be determined, limited by dementia. The patient's Past Medical, Family and Social History were reviewed and are on file in the EMR. Past Medical History:  Diagnosis Date  . Acute saddle pulmonary embolism (HCC) 05/20/2016  . Anxiety   . Bilateral swelling of feet   . BPH (benign prostatic hyperplasia)    no meds needed per medical md  . Cataracts, bilateral    immature  . Chronic diarrhea   . Deafness    in left ear   . Depression    takes Zoloft daily  . Diabetes mellitus    takes Actos and Amaryl daily as well as Onglyza  . Dysphagia   . Fatigue   . GERD (gastroesophageal reflux disease)    takes Omeprazole daily  . Headaches, cluster   . Hiatal hernia   . Hyperlipemia    was on meds but hasnt been on anything in about 44months per MD  . Hypertension    takes Amlodipine,HCTZ,and Lisinopril daily  . Hyperthyroidism   . Insomnia    takes  Trazodone nightly  . Nocturia   . Osteoarthritis   . Peripheral neuropathy    takes Gabapentin daily  . Pneumonia 3+ yrs ago  . Presbyesophagus   . Rheumatoid arthritis(714.0)   . Vitamin D deficiency    takes Vit D daily   Past Surgical History:  Procedure Laterality Date  . BACK SURGERY  1998  . BOTOX INJECTION N/A 11/11/2016   Procedure: BOTOX INJECTION;  Surgeon: Sherrilyn Rist, MD;  Location: [facility];  Service: Gastroenterology;  Laterality: N/A;  . BOTOX INJECTION N/A 09/07/2018   Procedure: BOTOX INJECTION;  Surgeon: Sherrilyn Rist, MD;  Location: [facility];  Service: Gastroenterology;  Laterality: N/A;  . BOTOX INJECTION N/A 10/18/2019   Procedure: BOTOX INJECTION;  Surgeon: Sherrilyn Rist, MD;  Location: [facility];  Service: Gastroenterology;  Laterality: N/A;  . CATARACT EXTRACTION, BILATERAL  2018  . COLONOSCOPY WITH ESOPHAGOGASTRODUODENOSCOPY (EGD) AND ESOPHAGEAL DILATION (ED)    . ESOPHAGEAL MANOMETRY N/A 09/14/2016   Procedure: ESOPHAGEAL MANOMETRY (EM);  Surgeon: Ruffin Frederick, MD;  Location: [facility];  Service: Gastroenterology;  Laterality: N/A;  . ESOPHAGOGASTRODUODENOSCOPY N/A 11/11/2016   Procedure: ESOPHAGOGASTRODUODENOSCOPY (EGD);  Surgeon: Sherrilyn Rist, MD;  Location: [facility];  Service: Gastroenterology;  Laterality: N/A;  . ESOPHAGOGASTRODUODENOSCOPY (EGD) WITH PROPOFOL N/A 09/15/2016   Procedure: ESOPHAGOGASTRODUODENOSCOPY (EGD)  WITH PROPOFOL;  Surgeon: Ruffin Frederick, MD;  Location: [facility];  Service: Gastroenterology;  Laterality: N/A;  . ESOPHAGOGASTRODUODENOSCOPY (EGD) WITH PROPOFOL N/A 09/07/2018   Procedure: ESOPHAGOGASTRODUODENOSCOPY (EGD) WITH PROPOFOL;  Surgeon: Sherrilyn Rist, MD;  Location: [facility];  Service: Gastroenterology;  Laterality: N/A;  . ESOPHAGOGASTRODUODENOSCOPY (EGD) WITH PROPOFOL N/A 10/18/2019   Procedure: ESOPHAGOGASTRODUODENOSCOPY (EGD) WITH PROPOFOL;  Surgeon: Sherrilyn Rist, MD;  Location: [facility];  Service: Gastroenterology;  Laterality: N/A;  . IRRIGATION AND DEBRIDEMENT KNEE Right 01/12/2015  . TOTAL KNEE ARTHROPLASTY Right 01/06/2015   Procedure: TOTAL KNEE ARTHROPLASTY;  Surgeon: Marcene Corning, MD;  Location: [facility];  Service: Orthopedics;  Laterality: Right;    Objective:  Med list reviewed  Current Outpatient Medications:  .  acetaminophen (TYLENOL) 500 MG tablet, Take 1,000 mg by mouth daily as needed for mild pain, moderate pain or headache. , Disp: , Rfl:  .  atorvastatin (LIPITOR) 40 MG tablet, Take 40 mg by mouth at bedtime. , Disp: , Rfl:  .  bismuth subsalicylate (PEPTO BISMOL) 262 MG/15ML suspension, Take 30 mLs by mouth every 6 (six) hours as needed for indigestion or diarrhea or loose stools., Disp: , Rfl:  .  cetirizine (ZYRTEC) 10 MG tablet, Take 10 mg by mouth daily., Disp: , Rfl:  .  cholecalciferol (VITAMIN D) 1000 units tablet, Take 1,000 Units by mouth daily. , Disp: , Rfl:  .  dextromethorphan-guaiFENesin (MUCINEX DM) 30-600 MG 12hr tablet, Take 1 tablet by mouth 2 (two) times daily., Disp: , Rfl:  .  diltiazem (CARDIZEM) 60 MG tablet, Take 60 mg by mouth in the morning and at bedtime., Disp: , Rfl:  .  ferrous sulfate 325 (65 FE) MG tablet, Take 325 mg by mouth daily with breakfast., Disp: , Rfl:  .  furosemide (LASIX) 20 MG tablet, Take 1 tablet (20 mg total) by mouth daily., Disp: , Rfl:  .  gabapentin (NEURONTIN) 100 MG capsule, Take 1-2 capsules (100-200 mg total) by mouth See admin instructions. 100mg  in the morning and at noon and 200mg  at bedtime (Patient taking differently: Take 100-200 mg by mouth See admin instructions. 100mg  in the morning, 100 mg in the evening, and 200mg  at bedtime), Disp: , Rfl:  .  glimepiride (AMARYL) 4 MG tablet, Take 4 mg by mouth 2 (two) times daily., Disp: , Rfl:  .  lisinopril (PRINIVIL,ZESTRIL) 20 MG tablet, Take 10 mg by mouth daily., Disp: , Rfl:  .  loperamide (IMODIUM) 1 MG/5ML solution,  Take 3 mg by mouth as needed for diarrhea or loose stools., Disp: , Rfl:  .  Multiple Vitamin (MULTIVITAMIN WITH MINERALS) TABS tablet, Take 1 tablet by mouth daily., Disp: , Rfl:  .  omeprazole (PRILOSEC) 20 MG capsule, Take 40 mg by mouth daily., Disp: , Rfl:  .  Polyethyl Glycol-Propyl Glycol (SYSTANE OP), Place 1 drop into both eyes daily as needed (dry eyes)., Disp: , Rfl:  .  potassium chloride (K-DUR) 10 MEQ tablet, Take 10 mEq by mouth daily., Disp: , Rfl:  .  propranolol (INDERAL) 10 MG tablet, Take 10 mg by mouth 2 (two) times daily., Disp: , Rfl:  .  rivaroxaban (XARELTO) 20 MG TABS tablet, Take 1 tablet (20 mg total) by mouth daily., Disp: , Rfl:  .  sertraline (ZOLOFT) 50 MG tablet, Take 50 mg by mouth daily., Disp: , Rfl:  .  traMADol (ULTRAM) 50 MG tablet, Take 50 mg by mouth every 8 (eight) hours as needed  for moderate pain., Disp: , Rfl:  .  traZODone (DESYREL) 50 MG tablet, Take 50 mg by mouth at bedtime., Disp: , Rfl:  .  VICTOZA 18 MG/3ML SOPN, Inject 0.6 mg into the skin daily at 12 noon. , Disp: , Rfl:  .  vitamin E 400 UNIT capsule, Take 400 Units by mouth daily., Disp: , Rfl:  .  memantine (NAMENDA) 10 MG tablet, Take 10 mg by mouth 2 (two) times daily. (Patient not taking: Reported on 11/16/2020), Disp: , Rfl:    Vital signs in last 24 hrs: Vitals:   11/16/20 1602  BP: 130/74  Pulse: 64   Wt Readings from Last 3 Encounters:  11/16/20 272 lb 9.6 oz (123.7 kg)  10/18/19 281 lb 1.4 oz (127.5 kg)  10/04/19 281 lb (127.5 kg)    Physical Exam   HEENT: sclera anicteric, oral mucosa moist without lesions  Neck: supple, no thyromegaly, JVD or lymphadenopathy  Cardiac: RRR without murmurs, S1S2 heard, no peripheral edema  Pulm: clear to auscultation bilaterally, normal RR and effort noted  Abdomen: soft, umbilical tenderness, with active bowel sounds.  Exam somewhat limited given body habitus and because he cannot get on exam table due to mobility issues.  Deep  palpation of periumbilical area reveals a hernia, no protuberance or apparent incarcerated bowel.  Skin; warm and dry, no jaundice or rash  Labs:   ___________________________________________ Radiologic studies:  2017 CTAP report on file ____________________________________________ Other:   _____________________________________________ Assessment & Plan  Assessment: Encounter Diagnoses  Name Primary?  . Achalasia Yes  . Esophageal dysphagia   . Chronic anticoagulation   . Periumbilical abdominal pain    His chronic abdominal pain appears to be from an umbilical hernia.  Given his age and comorbidities, I would not advocate surgical repair unless he develops a bowel obstruction. Worsening of achalasia related dysphagia to both solids and liquids.  He needs upper endoscopy with LES Botox injection.  Increase procedure risk due to age and comorbidities and chronic anticoagulation.  We will communicate with primary care, who has been agreeable to a 2-day hold of the [facility] for procedures in the past.  Limited availability for a hospital-based procedure, so it might be done with one of my partners if they are available sooner than I am, and his wife was agreeable to that.  The benefits and risks of the planned procedure were described in detail with the patient or (when appropriate) their health care proxy.  Risks were outlined as including, but not limited to, bleeding, infection, perforation, adverse medication reaction leading to cardiac or pulmonary decompensation, pancreatitis (if ERCP).  The limitation of incomplete mucosal visualization was also discussed.  No guarantees or warranties were given.  32 minutes were spent on this encounter (including chart review, history/exam, counseling/coordination of care, and documentation) > 50% of that time was spent on counseling and coordination of care.  Topics discussed included: Umbilical hernia, abdominal pain, achalasia, EGD with Botox risks  and benefits.  She understands the small but real risk of blood clot during the brief hold of [facility].  Charlie Pitter III

## 2020-11-16 NOTE — Patient Instructions (Signed)
If you are age 80 or older, your body mass index should be between 23-30. Your Body mass index is 35.97 kg/m. If this is out of the aforementioned range listed, please consider follow up with your Primary Care Provider.  If you are age 65 or younger, your body mass index should be between 19-25. Your Body mass index is 35.97 kg/m. If this is out of the aformentioned range listed, please consider follow up with your Primary Care Provider.    You have been scheduled for an endoscopy. Please follow written instructions given to you at your visit today. If you use inhalers (even only as needed), please bring them with you on the day of your procedure.  It was a pleasure to see you today!  Thank you for trusting me with your gastrointestinal care!

## 2020-11-16 NOTE — Progress Notes (Signed)
[facility] Progress Note  Chief Complaint: Achalasia  Subjective  History: Andrew Winters was last seen for his achalasia in the office on October 04, 2019, and underwent EGD with LES Botox injection on 10/18/2019. He is here with his wife, who gives the history because of Andrew Winters' dementia. He continues to have vague periumbilical abdominal pain, which has been present for years.  It bothers him on and off and is difficult to characterize.  Umbilical hernia with nonincarcerated bowel was seen on a 2017 CT scan.  Dysphagia has worsened in the last few months, giving trouble with both solids and liquids that he might have to bring back up.  They were unable to make prior office visit because he was having difficulty with dizziness, and his wife says that primary care has been adjusting his blood pressure medicines for that problem.  Problem seems to be steadily worsening.  She also believes he is losing some weight as a result.  ROS: Cardiovascular:  no chest pain Respiratory: no dyspnea Remainder of systems negative as near as can be determined, limited by dementia. The patient's Past Medical, Family and Social History were reviewed and are on file in the EMR. Past Medical History:  Diagnosis Date  . Acute saddle pulmonary embolism (HCC) 05/20/2016  . Anxiety   . Bilateral swelling of feet   . BPH (benign prostatic hyperplasia)    no meds needed per medical md  . Cataracts, bilateral    immature  . Chronic diarrhea   . Deafness    in left ear   . Depression    takes Zoloft daily  . Diabetes mellitus    takes Actos and Amaryl daily as well as Onglyza  . Dysphagia   . Fatigue   . GERD (gastroesophageal reflux disease)    takes Omeprazole daily  . Headaches, cluster   . Hiatal hernia   . Hyperlipemia    was on meds but hasnt been on anything in about 44months per MD  . Hypertension    takes Amlodipine,HCTZ,and Lisinopril daily  . Hyperthyroidism   . Insomnia    takes  Trazodone nightly  . Nocturia   . Osteoarthritis   . Peripheral neuropathy    takes Gabapentin daily  . Pneumonia 3+ yrs ago  . Presbyesophagus   . Rheumatoid arthritis(714.0)   . Vitamin D deficiency    takes Vit D daily   Past Surgical History:  Procedure Laterality Date  . BACK SURGERY  1998  . BOTOX INJECTION N/A 11/11/2016   Procedure: BOTOX INJECTION;  Surgeon: Sherrilyn Rist, MD;  Location: [facility];  Service: Gastroenterology;  Laterality: N/A;  . BOTOX INJECTION N/A 09/07/2018   Procedure: BOTOX INJECTION;  Surgeon: Sherrilyn Rist, MD;  Location: [facility];  Service: Gastroenterology;  Laterality: N/A;  . BOTOX INJECTION N/A 10/18/2019   Procedure: BOTOX INJECTION;  Surgeon: Sherrilyn Rist, MD;  Location: [facility];  Service: Gastroenterology;  Laterality: N/A;  . CATARACT EXTRACTION, BILATERAL  2018  . COLONOSCOPY WITH ESOPHAGOGASTRODUODENOSCOPY (EGD) AND ESOPHAGEAL DILATION (ED)    . ESOPHAGEAL MANOMETRY N/A 09/14/2016   Procedure: ESOPHAGEAL MANOMETRY (EM);  Surgeon: Ruffin Frederick, MD;  Location: [facility];  Service: Gastroenterology;  Laterality: N/A;  . ESOPHAGOGASTRODUODENOSCOPY N/A 11/11/2016   Procedure: ESOPHAGOGASTRODUODENOSCOPY (EGD);  Surgeon: Sherrilyn Rist, MD;  Location: [facility];  Service: Gastroenterology;  Laterality: N/A;  . ESOPHAGOGASTRODUODENOSCOPY (EGD) WITH PROPOFOL N/A 09/15/2016   Procedure: ESOPHAGOGASTRODUODENOSCOPY (EGD)  WITH PROPOFOL;  Surgeon: Ruffin Frederick, MD;  Location: [facility];  Service: Gastroenterology;  Laterality: N/A;  . ESOPHAGOGASTRODUODENOSCOPY (EGD) WITH PROPOFOL N/A 09/07/2018   Procedure: ESOPHAGOGASTRODUODENOSCOPY (EGD) WITH PROPOFOL;  Surgeon: Sherrilyn Rist, MD;  Location: [facility];  Service: Gastroenterology;  Laterality: N/A;  . ESOPHAGOGASTRODUODENOSCOPY (EGD) WITH PROPOFOL N/A 10/18/2019   Procedure: ESOPHAGOGASTRODUODENOSCOPY (EGD) WITH PROPOFOL;  Surgeon: Sherrilyn Rist, MD;  Location: [facility];  Service: Gastroenterology;  Laterality: N/A;  . IRRIGATION AND DEBRIDEMENT KNEE Right 01/12/2015  . TOTAL KNEE ARTHROPLASTY Right 01/06/2015   Procedure: TOTAL KNEE ARTHROPLASTY;  Surgeon: Marcene Corning, MD;  Location: [facility];  Service: Orthopedics;  Laterality: Right;    Objective:  Med list reviewed  Current Outpatient Medications:  .  acetaminophen (TYLENOL) 500 MG tablet, Take 1,000 mg by mouth daily as needed for mild pain, moderate pain or headache. , Disp: , Rfl:  .  atorvastatin (LIPITOR) 40 MG tablet, Take 40 mg by mouth at bedtime. , Disp: , Rfl:  .  bismuth subsalicylate (PEPTO BISMOL) 262 MG/15ML suspension, Take 30 mLs by mouth every 6 (six) hours as needed for indigestion or diarrhea or loose stools., Disp: , Rfl:  .  cetirizine (ZYRTEC) 10 MG tablet, Take 10 mg by mouth daily., Disp: , Rfl:  .  cholecalciferol (VITAMIN D) 1000 units tablet, Take 1,000 Units by mouth daily. , Disp: , Rfl:  .  dextromethorphan-guaiFENesin (MUCINEX DM) 30-600 MG 12hr tablet, Take 1 tablet by mouth 2 (two) times daily., Disp: , Rfl:  .  diltiazem (CARDIZEM) 60 MG tablet, Take 60 mg by mouth in the morning and at bedtime., Disp: , Rfl:  .  ferrous sulfate 325 (65 FE) MG tablet, Take 325 mg by mouth daily with breakfast., Disp: , Rfl:  .  furosemide (LASIX) 20 MG tablet, Take 1 tablet (20 mg total) by mouth daily., Disp: , Rfl:  .  gabapentin (NEURONTIN) 100 MG capsule, Take 1-2 capsules (100-200 mg total) by mouth See admin instructions. 100mg  in the morning and at noon and 200mg  at bedtime (Patient taking differently: Take 100-200 mg by mouth See admin instructions. 100mg  in the morning, 100 mg in the evening, and 200mg  at bedtime), Disp: , Rfl:  .  glimepiride (AMARYL) 4 MG tablet, Take 4 mg by mouth 2 (two) times daily., Disp: , Rfl:  .  lisinopril (PRINIVIL,ZESTRIL) 20 MG tablet, Take 10 mg by mouth daily., Disp: , Rfl:  .  loperamide (IMODIUM) 1 MG/5ML solution,  Take 3 mg by mouth as needed for diarrhea or loose stools., Disp: , Rfl:  .  Multiple Vitamin (MULTIVITAMIN WITH MINERALS) TABS tablet, Take 1 tablet by mouth daily., Disp: , Rfl:  .  omeprazole (PRILOSEC) 20 MG capsule, Take 40 mg by mouth daily., Disp: , Rfl:  .  Polyethyl Glycol-Propyl Glycol (SYSTANE OP), Place 1 drop into both eyes daily as needed (dry eyes)., Disp: , Rfl:  .  potassium chloride (K-DUR) 10 MEQ tablet, Take 10 mEq by mouth daily., Disp: , Rfl:  .  propranolol (INDERAL) 10 MG tablet, Take 10 mg by mouth 2 (two) times daily., Disp: , Rfl:  .  rivaroxaban (XARELTO) 20 MG TABS tablet, Take 1 tablet (20 mg total) by mouth daily., Disp: , Rfl:  .  sertraline (ZOLOFT) 50 MG tablet, Take 50 mg by mouth daily., Disp: , Rfl:  .  traMADol (ULTRAM) 50 MG tablet, Take 50 mg by mouth every 8 (eight) hours as needed  for moderate pain., Disp: , Rfl:  .  traZODone (DESYREL) 50 MG tablet, Take 50 mg by mouth at bedtime., Disp: , Rfl:  .  VICTOZA 18 MG/3ML SOPN, Inject 0.6 mg into the skin daily at 12 noon. , Disp: , Rfl:  .  vitamin E 400 UNIT capsule, Take 400 Units by mouth daily., Disp: , Rfl:  .  memantine (NAMENDA) 10 MG tablet, Take 10 mg by mouth 2 (two) times daily. (Patient not taking: Reported on 11/16/2020), Disp: , Rfl:    Vital signs in last 24 hrs: Vitals:   11/16/20 1602  BP: 130/74  Pulse: 64   Wt Readings from Last 3 Encounters:  11/16/20 272 lb 9.6 oz (123.7 kg)  10/18/19 281 lb 1.4 oz (127.5 kg)  10/04/19 281 lb (127.5 kg)    Physical Exam   HEENT: sclera anicteric, oral mucosa moist without lesions  Neck: supple, no thyromegaly, JVD or lymphadenopathy  Cardiac: RRR without murmurs, S1S2 heard, no peripheral edema  Pulm: clear to auscultation bilaterally, normal RR and effort noted  Abdomen: soft, umbilical tenderness, with active bowel sounds.  Exam somewhat limited given body habitus and because he cannot get on exam table due to mobility issues.  Deep  palpation of periumbilical area reveals a hernia, no protuberance or apparent incarcerated bowel.  Skin; warm and dry, no jaundice or rash  Labs:   ___________________________________________ Radiologic studies:  2017 CTAP report on file ____________________________________________ Other:   _____________________________________________ Assessment & Plan  Assessment: Encounter Diagnoses  Name Primary?  . Achalasia Yes  . Esophageal dysphagia   . Chronic anticoagulation   . Periumbilical abdominal pain    His chronic abdominal pain appears to be from an umbilical hernia.  Given his age and comorbidities, I would not advocate surgical repair unless he develops a bowel obstruction. Worsening of achalasia related dysphagia to both solids and liquids.  He needs upper endoscopy with LES Botox injection.  Increase procedure risk due to age and comorbidities and chronic anticoagulation.  We will communicate with primary care, who has been agreeable to a 2-day hold of the [facility] for procedures in the past.  Limited availability for a hospital-based procedure, so it might be done with one of my partners if they are available sooner than I am, and his wife was agreeable to that.  The benefits and risks of the planned procedure were described in detail with the patient or (when appropriate) their health care proxy.  Risks were outlined as including, but not limited to, bleeding, infection, perforation, adverse medication reaction leading to cardiac or pulmonary decompensation, pancreatitis (if ERCP).  The limitation of incomplete mucosal visualization was also discussed.  No guarantees or warranties were given.  32 minutes were spent on this encounter (including chart review, history/exam, counseling/coordination of care, and documentation) > 50% of that time was spent on counseling and coordination of care.  Topics discussed included: Umbilical hernia, abdominal pain, achalasia, EGD with Botox risks  and benefits.  She understands the small but real risk of blood clot during the brief hold of OAC.  Toussaint Golson L Danis III  

## 2020-11-17 ENCOUNTER — Telehealth: Payer: Self-pay

## 2020-11-17 NOTE — Telephone Encounter (Addendum)
Xarelto clearance letter has been sent electronically within epic to Dr Debbe Bales

## 2020-11-17 NOTE — Telephone Encounter (Signed)
Per Dr Myrtie Neither. He told me to schedule this patient for one of Dr Fayne Norrie hospital cases. He will send communication to Dr Barron Alvine

## 2020-11-17 NOTE — Telephone Encounter (Signed)
Return fax from Dr Curley Spice office. Ok to hold 3 days prior to the scheduled endoscopic procedure. Pateints wife Dois Davenport has been notified and aware. No additional questions at this time.

## 2020-11-20 DIAGNOSIS — M159 Polyosteoarthritis, unspecified: Secondary | ICD-10-CM | POA: Diagnosis not present

## 2020-11-20 DIAGNOSIS — N400 Enlarged prostate without lower urinary tract symptoms: Secondary | ICD-10-CM | POA: Diagnosis not present

## 2020-11-20 DIAGNOSIS — E1169 Type 2 diabetes mellitus with other specified complication: Secondary | ICD-10-CM | POA: Diagnosis not present

## 2020-11-20 DIAGNOSIS — I951 Orthostatic hypotension: Secondary | ICD-10-CM | POA: Diagnosis not present

## 2020-11-20 DIAGNOSIS — K219 Gastro-esophageal reflux disease without esophagitis: Secondary | ICD-10-CM | POA: Diagnosis not present

## 2020-11-20 DIAGNOSIS — N179 Acute kidney failure, unspecified: Secondary | ICD-10-CM | POA: Diagnosis not present

## 2020-11-20 DIAGNOSIS — E7800 Pure hypercholesterolemia, unspecified: Secondary | ICD-10-CM | POA: Diagnosis not present

## 2020-11-20 DIAGNOSIS — E669 Obesity, unspecified: Secondary | ICD-10-CM | POA: Diagnosis not present

## 2020-11-20 DIAGNOSIS — I10 Essential (primary) hypertension: Secondary | ICD-10-CM | POA: Diagnosis not present

## 2020-11-24 DIAGNOSIS — F331 Major depressive disorder, recurrent, moderate: Secondary | ICD-10-CM | POA: Diagnosis not present

## 2020-11-24 DIAGNOSIS — R972 Elevated prostate specific antigen [PSA]: Secondary | ICD-10-CM | POA: Diagnosis not present

## 2020-11-24 DIAGNOSIS — I872 Venous insufficiency (chronic) (peripheral): Secondary | ICD-10-CM | POA: Diagnosis not present

## 2020-11-24 DIAGNOSIS — N400 Enlarged prostate without lower urinary tract symptoms: Secondary | ICD-10-CM | POA: Diagnosis not present

## 2020-11-24 DIAGNOSIS — Z6835 Body mass index (BMI) 35.0-35.9, adult: Secondary | ICD-10-CM | POA: Diagnosis not present

## 2020-11-24 DIAGNOSIS — E7800 Pure hypercholesterolemia, unspecified: Secondary | ICD-10-CM | POA: Diagnosis not present

## 2020-11-24 DIAGNOSIS — Z1339 Encounter for screening examination for other mental health and behavioral disorders: Secondary | ICD-10-CM | POA: Diagnosis not present

## 2020-11-24 DIAGNOSIS — I10 Essential (primary) hypertension: Secondary | ICD-10-CM | POA: Diagnosis not present

## 2020-11-24 DIAGNOSIS — Z0000 Encounter for general adult medical examination without abnormal findings: Secondary | ICD-10-CM | POA: Diagnosis not present

## 2020-11-24 DIAGNOSIS — E1140 Type 2 diabetes mellitus with diabetic neuropathy, unspecified: Secondary | ICD-10-CM | POA: Diagnosis not present

## 2020-11-24 DIAGNOSIS — Z1331 Encounter for screening for depression: Secondary | ICD-10-CM | POA: Diagnosis not present

## 2020-11-30 ENCOUNTER — Other Ambulatory Visit (HOSPITAL_COMMUNITY): Payer: Medicare HMO

## 2020-11-30 ENCOUNTER — Other Ambulatory Visit: Payer: Self-pay

## 2020-11-30 ENCOUNTER — Encounter (HOSPITAL_COMMUNITY): Payer: Self-pay | Admitting: Gastroenterology

## 2020-11-30 NOTE — Progress Notes (Signed)
Attempted to obtain medical history via telephone, unable to reach at this time. I left a voicemail to return pre surgical testing department's phone call.  

## 2020-12-03 ENCOUNTER — Ambulatory Visit (HOSPITAL_COMMUNITY)
Admission: RE | Admit: 2020-12-03 | Discharge: 2020-12-03 | Disposition: A | Discharge status: Home / Self Care | Payer: Medicare HMO | Attending: Gastroenterology | Admitting: Gastroenterology

## 2020-12-03 ENCOUNTER — Ambulatory Visit (HOSPITAL_COMMUNITY): Payer: Medicare HMO | Admitting: Certified Registered Nurse Anesthetist

## 2020-12-03 ENCOUNTER — Encounter (HOSPITAL_COMMUNITY)
Admission: RE | Disposition: A | Discharge status: Home / Self Care | Payer: Self-pay | Source: Home / Self Care | Attending: Gastroenterology

## 2020-12-03 ENCOUNTER — Other Ambulatory Visit: Payer: Self-pay

## 2020-12-03 ENCOUNTER — Encounter (HOSPITAL_COMMUNITY): Payer: Self-pay | Admitting: Gastroenterology

## 2020-12-03 DIAGNOSIS — K2990 Gastroduodenitis, unspecified, without bleeding: Secondary | ICD-10-CM

## 2020-12-03 DIAGNOSIS — R1310 Dysphagia, unspecified: Secondary | ICD-10-CM | POA: Diagnosis not present

## 2020-12-03 DIAGNOSIS — Z86711 Personal history of pulmonary embolism: Secondary | ICD-10-CM | POA: Insufficient documentation

## 2020-12-03 DIAGNOSIS — K2950 Unspecified chronic gastritis without bleeding: Secondary | ICD-10-CM | POA: Diagnosis not present

## 2020-12-03 DIAGNOSIS — I10 Essential (primary) hypertension: Secondary | ICD-10-CM | POA: Diagnosis not present

## 2020-12-03 DIAGNOSIS — E876 Hypokalemia: Secondary | ICD-10-CM | POA: Diagnosis not present

## 2020-12-03 DIAGNOSIS — Z87891 Personal history of nicotine dependence: Secondary | ICD-10-CM | POA: Insufficient documentation

## 2020-12-03 DIAGNOSIS — R1319 Other dysphagia: Secondary | ICD-10-CM | POA: Diagnosis not present

## 2020-12-03 DIAGNOSIS — E1142 Type 2 diabetes mellitus with diabetic polyneuropathy: Secondary | ICD-10-CM | POA: Diagnosis not present

## 2020-12-03 DIAGNOSIS — M069 Rheumatoid arthritis, unspecified: Secondary | ICD-10-CM | POA: Insufficient documentation

## 2020-12-03 DIAGNOSIS — K220 Achalasia of cardia: Secondary | ICD-10-CM

## 2020-12-03 DIAGNOSIS — Z79899 Other long term (current) drug therapy: Secondary | ICD-10-CM | POA: Insufficient documentation

## 2020-12-03 DIAGNOSIS — Z7984 Long term (current) use of oral hypoglycemic drugs: Secondary | ICD-10-CM | POA: Diagnosis not present

## 2020-12-03 DIAGNOSIS — K219 Gastro-esophageal reflux disease without esophagitis: Secondary | ICD-10-CM | POA: Diagnosis not present

## 2020-12-03 DIAGNOSIS — Z7901 Long term (current) use of anticoagulants: Secondary | ICD-10-CM | POA: Diagnosis not present

## 2020-12-03 DIAGNOSIS — R1033 Periumbilical pain: Secondary | ICD-10-CM

## 2020-12-03 DIAGNOSIS — K2970 Gastritis, unspecified, without bleeding: Secondary | ICD-10-CM

## 2020-12-03 DIAGNOSIS — E559 Vitamin D deficiency, unspecified: Secondary | ICD-10-CM | POA: Diagnosis not present

## 2020-12-03 DIAGNOSIS — K3189 Other diseases of stomach and duodenum: Secondary | ICD-10-CM | POA: Diagnosis not present

## 2020-12-03 HISTORY — PX: ESOPHAGOGASTRODUODENOSCOPY (EGD) WITH PROPOFOL: SHX5813

## 2020-12-03 HISTORY — PX: BIOPSY: SHX5522

## 2020-12-03 HISTORY — DX: Sleep apnea, unspecified: G47.30

## 2020-12-03 HISTORY — PX: BOTOX INJECTION: SHX5754

## 2020-12-03 HISTORY — DX: Unspecified dementia, unspecified severity, without behavioral disturbance, psychotic disturbance, mood disturbance, and anxiety: F03.90

## 2020-12-03 LAB — GLUCOSE, CAPILLARY: Glucose-Capillary: 117 mg/dL — ABNORMAL HIGH (ref 70–99)

## 2020-12-03 SURGERY — ESOPHAGOGASTRODUODENOSCOPY (EGD) WITH PROPOFOL
Anesthesia: Monitor Anesthesia Care

## 2020-12-03 MED ORDER — PROPOFOL 500 MG/50ML IV EMUL
INTRAVENOUS | Status: DC | PRN
  Administered 2020-12-03: 80 ug/kg/min via INTRAVENOUS

## 2020-12-03 MED ORDER — LIDOCAINE HCL (CARDIAC) PF 100 MG/5ML IV SOSY
PREFILLED_SYRINGE | INTRAVENOUS | Status: DC | PRN
  Administered 2020-12-03: 100 mg via INTRAVENOUS

## 2020-12-03 MED ORDER — LACTATED RINGERS IV SOLN: Freq: Once | INTRAVENOUS | Status: AC

## 2020-12-03 MED ORDER — SODIUM CHLORIDE (PF) 0.9 % IJ SOLN
INTRAMUSCULAR | Status: AC
  Filled 2020-12-03: qty 10

## 2020-12-03 MED ORDER — PROPOFOL 10 MG/ML IV BOLUS
INTRAVENOUS | Status: DC | PRN
  Administered 2020-12-03 (×3): 20 mg via INTRAVENOUS
  Administered 2020-12-03: 10 mg via INTRAVENOUS
  Administered 2020-12-03: 20 mg via INTRAVENOUS
  Administered 2020-12-03: 10 mg via INTRAVENOUS

## 2020-12-03 MED ORDER — LACTATED RINGERS IV SOLN: INTRAVENOUS | Status: DC | PRN

## 2020-12-03 MED ORDER — ONABOTULINUMTOXINA 100 UNITS IJ SOLR
INTRAMUSCULAR | Status: AC
  Filled 2020-12-03: qty 100

## 2020-12-03 MED ORDER — SODIUM CHLORIDE 0.9 % IV SOLN: INTRAVENOUS | Status: DC

## 2020-12-03 SURGICAL SUPPLY — 15 items

## 2020-12-03 NOTE — Interval H&P Note (Signed)
History and Physical Interval Note:  12/03/2020 10:14 AM  Andrew Winters  has presented today for surgery, with the diagnosis of Achalasia.  The various methods of treatment have been discussed with the patient and family. After consideration of risks, benefits and other options for treatment, the patient has consented to  Procedure(s): ESOPHAGOGASTRODUODENOSCOPY (EGD) WITH PROPOFOL (N/A) BOTOX INJECTION (N/A) as a surgical intervention.  The patient's history has been reviewed, patient examined, no change in status, stable for surgery.  I have reviewed the patient's chart and labs.  Questions were answered to the patient's satisfaction.     Verlin Dike Maevyn Riordan

## 2020-12-03 NOTE — Transfer of Care (Signed)
Immediate Anesthesia Transfer of Care Note  Patient: Andrew Winters  Procedure(s) Performed: ESOPHAGOGASTRODUODENOSCOPY (EGD) WITH PROPOFOL (N/A ) BOTOX INJECTION (N/A ) BIOPSY  Patient Location: Endoscopy Unit  Anesthesia Type:MAC  Level of Consciousness: awake and patient cooperative  Airway & Oxygen Therapy: Patient Spontanous Breathing and Patient connected to face mask oxygen  Post-op Assessment: Report given to RN and Post -op Vital signs reviewed and stable  Post vital signs: Reviewed and stable  Last Vitals:  Vitals Value Taken Time  BP 122/73 12/03/20 1213  Temp    Pulse 72 12/03/20 1215  Resp 23 12/03/20 1215  SpO2 100 % 12/03/20 1215  Vitals shown include unvalidated device data.  Last Pain:  Vitals:   12/03/20 0952  TempSrc: Oral  PainSc: 0-No pain         Complications: No complications documented.

## 2020-12-03 NOTE — Progress Notes (Signed)
Attempted to reach pt.  No answer.  Left message for call back.

## 2020-12-03 NOTE — Discharge Instructions (Signed)
YOU HAD AN ENDOSCOPIC PROCEDURE TODAY: Refer to the procedure report and other information in the discharge instructions given to you for any specific questions about what was found during the examination. If this information does not answer your questions, please call [facility] office at [phone] to clarify.   YOU SHOULD EXPECT: Some feelings of bloating in the abdomen. Passage of more gas than usual. Walking can help get rid of the air that was put into your GI tract during the procedure and reduce the bloating. If you had a lower endoscopy (such as a colonoscopy or flexible sigmoidoscopy) you may notice spotting of blood in your stool or on the toilet paper. Some abdominal soreness may be present for a day or two, also.  DIET: Your first meal following the procedure should be a light meal and then it is ok to progress to your normal diet. A half-sandwich or bowl of soup is an example of a good first meal. Heavy or fried foods are harder to digest and may make you feel nauseous or bloated. Drink plenty of fluids but you should avoid alcoholic beverages for 24 hours. If you had a esophageal dilation, please see attached instructions for diet.    ACTIVITY: Your care partner should take you home directly after the procedure. You should plan to take it easy, moving slowly for the rest of the day. You can resume normal activity the day after the procedure however YOU SHOULD NOT DRIVE, use power tools, machinery or perform tasks that involve climbing or major physical exertion for 24 hours (because of the sedation medicines used during the test).   SYMPTOMS TO REPORT IMMEDIATELY: A gastroenterologist can be reached at any hour. Please call [phone]  for any of the following symptoms:   Following upper endoscopy (EGD, EUS, ERCP, esophageal dilation) Vomiting of blood or coffee ground material  New, significant abdominal pain  New, significant chest pain or pain under the shoulder blades  Painful or  persistently difficult swallowing  New shortness of breath  Black, tarry-looking or red, bloody stools  FOLLOW UP:  If any biopsies were taken you will be contacted by phone or by letter within the next 1-3 weeks. Call [phone]  if you have not heard about the biopsies in 3 weeks.  Please also call with any specific questions about appointments or follow up tests.  

## 2020-12-03 NOTE — Anesthesia Postprocedure Evaluation (Signed)
Anesthesia Post Note  Patient: Andrew Winters  Procedure(s) Performed: ESOPHAGOGASTRODUODENOSCOPY (EGD) WITH PROPOFOL (N/A ) BOTOX INJECTION (N/A ) BIOPSY     Patient location during evaluation: PACU Anesthesia Type: MAC Level of consciousness: awake and alert Pain management: pain level controlled Vital Signs Assessment: post-procedure vital signs reviewed and stable Respiratory status: spontaneous breathing, nonlabored ventilation, respiratory function stable and patient connected to nasal cannula oxygen Cardiovascular status: stable and blood pressure returned to baseline Postop Assessment: no apparent nausea or vomiting Anesthetic complications: no   No complications documented.  Last Vitals:  Vitals:   12/03/20 1220 12/03/20 1230  BP: (!) 142/78 (!) 193/78  Pulse: 70 (!) 49  Resp: 19 16  Temp:    SpO2: 100% 100%    Last Pain:  Vitals:   12/03/20 1230  TempSrc:   PainSc: 0-No pain                 Deztiny Sarra S

## 2020-12-03 NOTE — Op Note (Signed)
[facility] Patient Name: Andrew Winters Procedure Date: 12/03/2020 MRN: [id number] Attending MD: Doristine Locks , MD Date of Birth: 03/07/1941 CSN: [id number] Age: 80 Admit Type: Outpatient Procedure:                Upper GI endoscopy Indications:              Dysphagia                           80 yo male with history of achalasia, previously                            responsive to botox injection in 10/2019, with                            recent progressive dysphagia again. Presents today                            for repeat EGD with botix injection of the LES. Providers:                Doristine Locks, MD, Rogue Jury, RN, Blenda Mounts, RN, Michele Mcalpine Technician Referring MD:              Medicines:                Monitored Anesthesia Care Complications:            No immediate complications. Estimated Blood Loss:     Estimated blood loss was minimal. Procedure:                Pre-Anesthesia Assessment:                           - Prior to the procedure, a History and Physical                            was performed, and patient medications and                            allergies were reviewed. The patient's tolerance of                            previous anesthesia was also reviewed. The risks                            and benefits of the procedure and the sedation                            options and risks were discussed with the patient.                            All questions were answered, and informed consent  was obtained. Prior Anticoagulants: The patient has                            taken Xarelto (rivaroxaban), last dose was 3 days                            prior to procedure. ASA Grade Assessment: III - A                            patient with severe systemic disease. After                            reviewing the risks and benefits, the patient was                            deemed  in satisfactory condition to undergo the                            procedure.                           After obtaining informed consent, the endoscope was                            passed under direct vision. Throughout the                            procedure, the patient's blood pressure, pulse, and                            oxygen saturations were monitored continuously. The                            GIF-H190 ([id number]) Olympus gastroscope was                            introduced through the mouth, and advanced to the                            second part of duodenum. The upper GI endoscopy was                            accomplished without difficulty. The patient                            tolerated the procedure well. Scope In: Scope Out: Findings:      The examined esophagus was normal.      The Z-line was regular and was found 45 cm from the incisors. Able to       traverse the GEJ with gentle endoscopic pressure. Area was successfully       injected with 100 units botulinum toxin. Estimated blood loss was       minimal.      Diffuse mild inflammation characterized by congestion (edema) and  erythema was found in the gastric fundus, in the gastric body and in the       gastric antrum. Biopsies were taken with a cold forceps for Helicobacter       pylori testing. Estimated blood loss was minimal.      The examined duodenum was normal. Impression:               - Normal esophagus.                           - Z-line regular, 45 cm from the incisors.                           - Able to traverse the GEJ with gentle endoscopic                            pressure. Injected with botulinum toxin.                           - Gastritis. Biopsied.                           - Normal examined duodenum. Moderate Sedation:      Not Applicable - Patient had care per Anesthesia. Recommendation:           - Patient has a contact number available for                             emergencies. The signs and symptoms of potential                            delayed complications were discussed with the                            patient. Return to normal activities tomorrow.                            Written discharge instructions were provided to the                            patient.                           - Soft diet today then advance slowly tomorrow as                            tolerated.                           - Continue present medications.                           - Await pathology results.                           - Repeat upper endoscopy PRN for retreatment.                           -  Follow-up with Dr. Myrtie Neither in the GI clinic at                            appointment to be scheduled. Procedure Code(s):        --- Professional ---                           [phone], 59, Esophagogastroduodenoscopy, flexible,                            transoral; with directed submucosal injection(s),                            any substance                           43239, Esophagogastroduodenoscopy, flexible,                            transoral; with biopsy, single or multiple Diagnosis Code(s):        --- Professional ---                           K29.70, Gastritis, unspecified, without bleeding                           R13.10, Dysphagia, unspecified CPT copyright 2019 American Medical Association. All rights reserved. The codes documented in this report are preliminary and upon coder review may  be revised to meet current compliance requirements. Doristine Locks, MD 12/03/2020 12:16:44 PM Number of Addenda: 0

## 2020-12-03 NOTE — Anesthesia Preprocedure Evaluation (Signed)
Anesthesia Evaluation  Patient identified by MRN, date of birth, ID band Patient awake    Reviewed: Allergy & Precautions, NPO status , Patient's Chart, lab work & pertinent test results  Airway Mallampati: II  TM Distance: >3 FB Neck ROM: Full    Dental no notable dental hx.    Pulmonary neg pulmonary ROS, former smoker,    Pulmonary exam normal breath sounds clear to auscultation       Cardiovascular hypertension, Pt. on medications Normal cardiovascular exam Rhythm:Regular Rate:Normal     Neuro/Psych negative neurological ROS  negative psych ROS   GI/Hepatic Neg liver ROS, GERD  Medicated,  Endo/Other  diabetes, Type 2Hyperthyroidism   Renal/GU negative Renal ROS  negative genitourinary   Musculoskeletal  (+) Arthritis , Rheumatoid disorders,    Abdominal   Peds negative pediatric ROS (+)  Hematology negative hematology ROS (+)   Anesthesia Other Findings   Reproductive/Obstetrics negative OB ROS                             Anesthesia Physical Anesthesia Plan  ASA: III  Anesthesia Plan: MAC   Post-op Pain Management:    Induction: Intravenous  PONV Risk Score and Plan: 1 and Propofol infusion and Treatment may vary due to age or medical condition  Airway Management Planned: Simple Face Mask  Additional Equipment:   Intra-op Plan:   Post-operative Plan:   Informed Consent: I have reviewed the patients History and Physical, chart, labs and discussed the procedure including the risks, benefits and alternatives for the proposed anesthesia with the patient or authorized representative who has indicated his/her understanding and acceptance.     Dental advisory given  Plan Discussed with: CRNA and Surgeon  Anesthesia Plan Comments:         Anesthesia Quick Evaluation

## 2020-12-04 LAB — SURGICAL PATHOLOGY

## 2020-12-06 ENCOUNTER — Encounter (HOSPITAL_COMMUNITY): Payer: Self-pay | Admitting: Gastroenterology

## 2020-12-11 ENCOUNTER — Encounter: Payer: Self-pay | Admitting: Gastroenterology

## 2020-12-16 ENCOUNTER — Encounter: Payer: Self-pay | Admitting: Sports Medicine

## 2020-12-16 ENCOUNTER — Other Ambulatory Visit: Payer: Self-pay

## 2020-12-16 ENCOUNTER — Ambulatory Visit: Payer: Medicare HMO | Admitting: Sports Medicine

## 2020-12-16 DIAGNOSIS — M79674 Pain in right toe(s): Secondary | ICD-10-CM

## 2020-12-16 DIAGNOSIS — E1142 Type 2 diabetes mellitus with diabetic polyneuropathy: Secondary | ICD-10-CM | POA: Diagnosis not present

## 2020-12-16 DIAGNOSIS — B351 Tinea unguium: Secondary | ICD-10-CM

## 2020-12-16 DIAGNOSIS — I739 Peripheral vascular disease, unspecified: Secondary | ICD-10-CM | POA: Diagnosis not present

## 2020-12-16 DIAGNOSIS — M79675 Pain in left toe(s): Secondary | ICD-10-CM | POA: Diagnosis not present

## 2020-12-16 NOTE — Progress Notes (Signed)
Subjective: Andrew Winters is a 80 y.o. male patient with history of diabetes who presents to office today complaining of long,mildly painful nails  while ambulating in shoes; unable to trim. Reports that he is doing good but wife reports  FBS 107 A1c unknown PCP Dr. Debbe Winters x March  Patient Active Problem List   Diagnosis Date Noted  . Achalasia   . Chronic anticoagulation   . Gastritis and gastroduodenitis   . Severe sepsis (HCC) 09/26/2017  . UTI (urinary tract infection) 09/26/2017  . Abnormal loss of weight   . History of snoring 09/20/2016  . Chronic diarrhea 09/20/2016  . Urinary tract infection without hematuria   . Vascular dementia without behavioral disturbance (HCC)   . Hypokalemia   . Dysphagia   . Chest pain 09/11/2016  . Pulmonary emboli (HCC) 05/20/2016  . Acute saddle pulmonary embolism without acute cor pulmonale (HCC) 05/20/2016  . Pseudogout of right knee 01/13/2015  . DVT of upper extremity (deep vein thrombosis): LEFT 01/11/2015  . Anemia, iron deficiency 01/11/2015  . Postoperative fever   . Fever 01/08/2015  . Diabetes mellitus type 2, controlled (HCC) 01/07/2015  . Essential hypertension 01/07/2015  . SIRS (systemic inflammatory response syndrome) (HCC) 01/07/2015  . Acute encephalopathy   . Primary osteoarthritis of right knee 01/06/2015  . Diabetes (HCC) 01/06/2015  . Morbid obesity (HCC) 01/06/2015  . Diabetic neuropathy associated with type 2 diabetes mellitus (HCC) 01/05/2010  . HEADACHE 05/07/2009  . UNSPECIFIED VITAMIN D DEFICIENCY 02/03/2009  . LIVER FUNCTION TESTS, ABNORMAL 07/19/2007  . HYPERTHYROIDISM 01/04/2007  . DIABETES MELLITUS, TYPE II 01/04/2007  . HYPERLIPIDEMIA 01/04/2007  . ERECTILE DYSFUNCTION 01/04/2007  . DEPRESSION 01/04/2007  . GERD 01/04/2007  . BENIGN PROSTATIC HYPERTROPHY 01/04/2007  . OSTEOARTHRITIS 01/04/2007   Current Outpatient Medications on File Prior to Visit  Medication Sig Dispense Refill  .  acetaminophen (TYLENOL) 500 MG tablet Take 1,000 mg by mouth 2 (two) times daily.    Marland Kitchen atorvastatin (LIPITOR) 40 MG tablet Take 40 mg by mouth at bedtime.     . bismuth subsalicylate (PEPTO BISMOL) 262 MG/15ML suspension Take 30 mLs by mouth every 6 (six) hours as needed for indigestion or diarrhea or loose stools.    . cetirizine (ZYRTEC) 10 MG tablet Take 10 mg by mouth daily.    . cholecalciferol (VITAMIN D) 1000 units tablet Take 1,000 Units by mouth daily.     Marland Kitchen dextromethorphan-guaiFENesin (MUCINEX DM) 30-600 MG 12hr tablet Take 1 tablet by mouth 2 (two) times daily.    Marland Kitchen diltiazem (CARDIZEM) 60 MG tablet Take 60 mg by mouth in the morning and at bedtime.    . ferrous sulfate 325 (65 FE) MG tablet Take 325 mg by mouth daily with breakfast.    . furosemide (LASIX) 20 MG tablet Take 1 tablet (20 mg total) by mouth daily.    Marland Kitchen gabapentin (NEURONTIN) 100 MG capsule Take 1-2 capsules (100-200 mg total) by mouth See admin instructions. 100mg  in the morning and at noon and 200mg  at bedtime (Patient taking differently: Take 100-200 mg by mouth See admin instructions. 100mg  in the morning, 100 mg in the evening, and 200mg  at bedtime)    . glimepiride (AMARYL) 4 MG tablet Take 4 mg by mouth 2 (two) times daily.    lisinopril (PRINIVIL,ZESTRIL) 20 MG tablet Take 5 mg by mouth daily.    loperamide (IMODIUM) 1 MG/5ML solution Take 3 mg by mouth as needed for diarrhea or loose stools.     memantine (NAMENDA) 10 MG tablet Take 10 mg by mouth 2 (two) times daily.    . Multiple Vitamin (MULTIVITAMIN WITH MINERALS) TABS tablet Take 1 tablet by mouth daily.    Marland Kitchen omeprazole (PRILOSEC) 20 MG capsule Take 20 mg by mouth daily.    . potassium chloride (K-DUR) 10 MEQ tablet Take 10 mEq by mouth daily.    . propranolol (INDERAL) 10 MG tablet Take 10 mg by mouth 2 (two) times daily.    . rivaroxaban (XARELTO) 20 MG TABS tablet Take 1 tablet (20 mg total) by mouth daily.    . sertraline (ZOLOFT) 50 MG tablet Take  100 mg by mouth daily.    . traMADol (ULTRAM) 50 MG tablet Take 50 mg by mouth every 8 (eight) hours as needed for moderate pain.    . traZODone (DESYREL) 50 MG tablet Take 50 mg by mouth at bedtime.    Marland Kitchen VICTOZA 18 MG/3ML SOPN Inject 0.6 mg into the skin daily at 12 noon.     . vitamin E 400 UNIT capsule Take 400 Units by mouth daily.     No current facility-administered medications on file prior to visit.   Allergies  Allergen Reactions  . Donepezil Diarrhea  . Aspirin Diarrhea    Stomach pain    Recent Results (from the past 2160 hour(s))  Glucose, capillary     Status: Abnormal   Collection Time: 12/03/20 10:38 AM  Result Value Ref Range   Glucose-Capillary 117 (H) 70 - 99 mg/dL    Comment: Glucose reference range applies only to samples taken after fasting for at least 8 hours.  Surgical pathology     Status: None   Collection Time: 12/03/20 12:03 PM  Result Value Ref Range   SURGICAL PATHOLOGY      SURGICAL PATHOLOGY CASE: [id number] PATIENT: Andrew Winters Surgical Pathology Report     Clinical History: Achalasia (crm)     FINAL MICROSCOPIC DIAGNOSIS:  A. STOMACH, BIOPSY: - Gastric antral and oxyntic mucosa with mild chronic gastritis - Gastric oxyntic mucosa with parietal cell hyperplasia as can be seen in hypergastrinemic states such as PPI therapy. - Warthin Starry stain is negative for Helicobacter pylori      GROSS DESCRIPTION:  Received in formalin are tan, soft tissue fragments that are submitted in toto. Number: 5 size: 0.2-0.5 cm blocks: 1 (GRP 12/03/2020)   Final Diagnosis performed by Holley Bouche, MD.   Electronically signed 12/04/2020 Technical and / or Professional components performed at [facility], [address], [place], [place] [address].  Immunohistochemistry Technical component (if applicable) was performed at [facility]. [address], [place], [place] [address].     IMMUNOHISTOCHEMISTRY DISCLAIMER (if applicable): Some of these immunohistochemical stains may have been developed and the performance characteristics determine by [facility]. Some may not have been cleared or approved by the U.S. Food and Drug Administration. The FDA has determined that such clearance or approval is not necessary. This test is used for clinical purposes. It should not be regarded as investigational or for research. This laboratory is certified under the Clinical Laboratory Improvement Amendments of 1988 (CLIA-88) as qualified to perform high complexity clinical laboratory testing.  The controls stained appropriately.     Objective: General: Patient is awake, alert, and oriented x 3 and in no acute distress.  Integument: Skin is warm, dry and supple bilateral. Nails are tender, long, thickened and dystrophic with subungual debris, consistent with onychomycosis, 1-5 bilateral.  No signs of infection. No open lesions or preulcerative lesions present bilateral. Mild dry skin at heels. Remaining integument unremarkable.  Vasculature:  Dorsalis Pedis pulse 1/4 bilateral. Posterior Tibial pulse  0/4 bilateral. Capillary fill time <5 sec 1-5 bilateral. Scant hair growth to the level of the digits. Temperature gradient within normal limits. No varicosities present bilateral. Trace edema present bilateral.   Neurology: The patient has intact sensation measured with a 5.07/10g Semmes Weinstein Monofilament at all pedal sites bilateral . Vibratory sensation diminished bilateral with tuning fork. Unchanged from prior.   Musculoskeletal: Asymptomatic pes planus and hammertoe pedal deformities noted bilateral. Muscular strength 5/5 in all lower extremity muscular groups bilateral without pain on range of motion.  No tenderness with calf compression bilateral.  Assessment and Plan: Problem List Items Addressed This Visit      Endocrine   Diabetic neuropathy associated with  type 2 diabetes mellitus (HCC)    Other Visit Diagnoses    Pain due to onychomycosis of toenails of both feet    -  Primary   PVD (peripheral vascular disease) (HCC)          -Examined patient. -Re-Discussed and educated patient on diabetic foot care, especially with  regards to the vascular, neurological and musculoskeletal systems.  -Mechanically debrided all nails 1-5 bilateral using sterile nail nipper and filed with dremel without incident  -Patient to return  in 3 months for at risk foot care -Patient advised to call the office if any problems or questions arise in the meantime.  Asencion Islam, DPM

## 2021-02-17 DIAGNOSIS — F0280 Dementia in other diseases classified elsewhere without behavioral disturbance: Secondary | ICD-10-CM | POA: Diagnosis not present

## 2021-02-17 DIAGNOSIS — G309 Alzheimer's disease, unspecified: Secondary | ICD-10-CM | POA: Diagnosis not present

## 2021-03-10 ENCOUNTER — Other Ambulatory Visit: Payer: Medicare HMO

## 2021-03-19 ENCOUNTER — Ambulatory Visit: Payer: Medicare HMO | Admitting: Sports Medicine

## 2021-03-26 ENCOUNTER — Encounter: Payer: Self-pay | Admitting: Sports Medicine

## 2021-03-26 ENCOUNTER — Ambulatory Visit (INDEPENDENT_AMBULATORY_CARE_PROVIDER_SITE_OTHER): Payer: Medicare HMO | Admitting: Sports Medicine

## 2021-03-26 ENCOUNTER — Other Ambulatory Visit: Payer: Self-pay

## 2021-03-26 DIAGNOSIS — I739 Peripheral vascular disease, unspecified: Secondary | ICD-10-CM

## 2021-03-26 DIAGNOSIS — M79674 Pain in right toe(s): Secondary | ICD-10-CM

## 2021-03-26 DIAGNOSIS — B351 Tinea unguium: Secondary | ICD-10-CM

## 2021-03-26 DIAGNOSIS — M79675 Pain in left toe(s): Secondary | ICD-10-CM

## 2021-03-26 DIAGNOSIS — E1142 Type 2 diabetes mellitus with diabetic polyneuropathy: Secondary | ICD-10-CM

## 2021-03-26 NOTE — Progress Notes (Signed)
Subjective: Andrew Winters is a 80 y.o. male patient with history of diabetes who presents to office today complaining of long,mildly painful nails  while ambulating in shoes; unable to trim. Reports that he is doing good no complaints at this time.  Patient is assisted by wife this visit.  FBS not recorded A1c unknown PCP Dr. Debbe Bales   Patient Active Problem List   Diagnosis Date Noted   Achalasia    Chronic anticoagulation    Gastritis and gastroduodenitis    Severe sepsis (HCC) 09/26/2017   UTI (urinary tract infection) 09/26/2017   Abnormal loss of weight    History of snoring 09/20/2016   Chronic diarrhea 09/20/2016   Urinary tract infection without hematuria    Vascular dementia without behavioral disturbance (HCC)    Hypokalemia    Dysphagia    Chest pain 09/11/2016   Pulmonary emboli (HCC) 05/20/2016   Acute saddle pulmonary embolism without acute cor pulmonale (HCC) 05/20/2016   Pseudogout of right knee 01/13/2015   DVT of upper extremity (deep vein thrombosis): LEFT 01/11/2015   Anemia, iron deficiency 01/11/2015   Postoperative fever    Fever 01/08/2015   Diabetes mellitus type 2, controlled (HCC) 01/07/2015   Essential hypertension 01/07/2015   SIRS (systemic inflammatory response syndrome) (HCC) 01/07/2015   Acute encephalopathy    Primary osteoarthritis of right knee 01/06/2015   Diabetes (HCC) 01/06/2015   Morbid obesity (HCC) 01/06/2015   Diabetic neuropathy associated with type 2 diabetes mellitus (HCC) 01/05/2010   HEADACHE 05/07/2009   UNSPECIFIED VITAMIN D DEFICIENCY 02/03/2009   LIVER FUNCTION TESTS, ABNORMAL 07/19/2007   HYPERTHYROIDISM 01/04/2007   DIABETES MELLITUS, TYPE II 01/04/2007   HYPERLIPIDEMIA 01/04/2007   ERECTILE DYSFUNCTION 01/04/2007   DEPRESSION 01/04/2007   GERD 01/04/2007   BENIGN PROSTATIC HYPERTROPHY 01/04/2007   OSTEOARTHRITIS 01/04/2007   Current Outpatient Medications on File Prior to Visit  Medication Sig Dispense Refill    acetaminophen (TYLENOL) 500 MG tablet Take 1,000 mg by mouth 2 (two) times daily.     atorvastatin (LIPITOR) 40 MG tablet Take 40 mg by mouth at bedtime.      bismuth subsalicylate (PEPTO BISMOL) 262 MG/15ML suspension Take 30 mLs by mouth every 6 (six) hours as needed for indigestion or diarrhea or loose stools.     cetirizine (ZYRTEC) 10 MG tablet Take 10 mg by mouth daily.     cholecalciferol (VITAMIN D) 1000 units tablet Take 1,000 Units by mouth daily.      dextromethorphan-guaiFENesin (MUCINEX DM) 30-600 MG 12hr tablet Take 1 tablet by mouth 2 (two) times daily.     diltiazem (CARDIZEM) 60 MG tablet Take 60 mg by mouth in the morning and at bedtime.     ferrous sulfate 325 (65 FE) MG tablet Take 325 mg by mouth daily with breakfast.     furosemide (LASIX) 20 MG tablet Take 1 tablet (20 mg total) by mouth daily.     gabapentin (NEURONTIN) 100 MG capsule Take 1-2 capsules (100-200 mg total) by mouth See admin instructions. 100mg  in the morning and at noon and 200mg  at bedtime (Patient taking differently: Take 100-200 mg by mouth See admin instructions. 100mg  in the morning, 100 mg in the evening, and 200mg  at bedtime)     glimepiride (AMARYL) 4 MG tablet Take 4 mg by mouth 2 (two) times daily.     lisinopril (PRINIVIL,ZESTRIL) 20 MG tablet Take 5 mg by mouth daily.     loperamide (IMODIUM) 1 MG/5ML solution Take 3 mg by  mouth as needed for diarrhea or loose stools.     memantine (NAMENDA) 10 MG tablet Take 10 mg by mouth 2 (two) times daily.     Multiple Vitamin (MULTIVITAMIN WITH MINERALS) TABS tablet Take 1 tablet by mouth daily.     omeprazole (PRILOSEC) 20 MG capsule Take 20 mg by mouth daily.     potassium chloride (K-DUR) 10 MEQ tablet Take 10 mEq by mouth daily.     propranolol (INDERAL) 10 MG tablet Take 10 mg by mouth 2 (two) times daily.     rivaroxaban (XARELTO) 20 MG TABS tablet Take 1 tablet (20 mg total) by mouth daily.     sertraline (ZOLOFT) 50 MG tablet Take 100 mg by mouth  daily.     traMADol (ULTRAM) 50 MG tablet Take 50 mg by mouth every 8 (eight) hours as needed for moderate pain.     traZODone (DESYREL) 50 MG tablet Take 50 mg by mouth at bedtime.     VICTOZA 18 MG/3ML SOPN Inject 0.6 mg into the skin daily at 12 noon.      vitamin E 400 UNIT capsule Take 400 Units by mouth daily.     No current facility-administered medications on file prior to visit.   Allergies  Allergen Reactions   Donepezil Diarrhea   Aspirin Diarrhea    Stomach pain    No results found for this or any previous visit (from the past 2160 hour(s)).   Objective: General: Patient is awake, alert, and oriented x 3 and in no acute distress.  Integument: Skin is warm, dry and supple bilateral. Nails are tender, long, thickened and dystrophic with subungual debris, consistent with onychomycosis, 1-5 bilateral. No signs of infection. No open lesions or preulcerative lesions present bilateral. Mild dry skin at heels. Remaining integument unremarkable.  Vasculature:  Dorsalis Pedis pulse 1/4 bilateral. Posterior Tibial pulse  0/4 bilateral. Capillary fill time <5 sec 1-5 bilateral. Scant hair growth to the level of the digits. Temperature gradient within normal limits. No varicosities present bilateral. Trace edema present bilateral.   Neurology: The patient has intact sensation measured with a 5.07/10g Semmes Weinstein Monofilament at all pedal sites bilateral . Vibratory sensation diminished bilateral with tuning fork. Unchanged from prior.   Musculoskeletal: Asymptomatic pes planus and hammertoe pedal deformities noted bilateral. Muscular strength 5/5 in all lower extremity muscular groups bilateral without pain on range of motion.  No tenderness with calf compression bilateral.  Assessment and Plan: Problem List Items Addressed This Visit       Endocrine   Diabetic neuropathy associated with type 2 diabetes mellitus (HCC)   Other Visit Diagnoses     Pain due to onychomycosis of  toenails of both feet    -  Primary   PVD (peripheral vascular disease) (HCC)           -Examined patient. -Re-Discussed and educated patient on diabetic foot care, especially with  regards to the vascular, neurological and musculoskeletal systems.  -Mechanically debrided all nails 1-5 bilateral using sterile nail nipper and filed with dremel without incident  -Reminded wife to assist husband with drying well in the -Patient to return  in 3 months for at risk foot care -Patient advised to call the office if any problems or questions arise in the meantime.  Asencion Islam, DPM

## 2021-03-29 DIAGNOSIS — F330 Major depressive disorder, recurrent, mild: Secondary | ICD-10-CM | POA: Diagnosis not present

## 2021-03-29 DIAGNOSIS — Z23 Encounter for immunization: Secondary | ICD-10-CM | POA: Diagnosis not present

## 2021-03-29 DIAGNOSIS — E1140 Type 2 diabetes mellitus with diabetic neuropathy, unspecified: Secondary | ICD-10-CM | POA: Diagnosis not present

## 2021-03-29 DIAGNOSIS — F0280 Dementia in other diseases classified elsewhere without behavioral disturbance: Secondary | ICD-10-CM | POA: Diagnosis not present

## 2021-03-29 DIAGNOSIS — G309 Alzheimer's disease, unspecified: Secondary | ICD-10-CM | POA: Diagnosis not present

## 2021-06-25 ENCOUNTER — Ambulatory Visit: Payer: Medicare HMO | Admitting: Sports Medicine

## 2021-06-25 ENCOUNTER — Encounter: Payer: Self-pay | Admitting: Sports Medicine

## 2021-06-25 DIAGNOSIS — M79675 Pain in left toe(s): Secondary | ICD-10-CM | POA: Diagnosis not present

## 2021-06-25 DIAGNOSIS — B351 Tinea unguium: Secondary | ICD-10-CM | POA: Diagnosis not present

## 2021-06-25 DIAGNOSIS — M79674 Pain in right toe(s): Secondary | ICD-10-CM | POA: Diagnosis not present

## 2021-06-25 DIAGNOSIS — I739 Peripheral vascular disease, unspecified: Secondary | ICD-10-CM

## 2021-06-25 DIAGNOSIS — E1142 Type 2 diabetes mellitus with diabetic polyneuropathy: Secondary | ICD-10-CM

## 2021-06-25 NOTE — Progress Notes (Signed)
Subjective: Demarquez Hamlyn is a 80 y.o. male patient with history of diabetes who presents to office today complaining of long,mildly painful nails  while ambulating in shoes; unable to trim.  Denies any complaints at this time.  Patient is assisted by wife this visit.  FBS not recorded this visit A1c unknown PCP Dr. Manson Allan   Patient Active Problem List   Diagnosis Date Noted   Achalasia    Chronic anticoagulation    Gastritis and gastroduodenitis    Severe sepsis ([place]) 09/26/2017   UTI (urinary tract infection) 09/26/2017   Abnormal loss of weight    History of snoring 09/20/2016   Chronic diarrhea 09/20/2016   Urinary tract infection without hematuria    Vascular dementia without behavioral disturbance (HCC)    Hypokalemia    Dysphagia    Chest pain 09/11/2016   Hammer toes, bilateral 08/18/2016   Pulmonary emboli ([place]) 05/20/2016   Acute saddle pulmonary embolism without acute cor pulmonale (HCC) 05/20/2016   Onychomycosis due to dermatophyte 12/29/2015   Pseudogout of right knee 01/13/2015   DVT of upper extremity (deep vein thrombosis): LEFT 01/11/2015   Anemia, iron deficiency 01/11/2015   Postoperative fever    Fever 01/08/2015   Diabetes mellitus type 2, controlled ([place]) 01/07/2015   Essential hypertension 01/07/2015   SIRS (systemic inflammatory response syndrome) ([place]) 01/07/2015   Acute encephalopathy    Primary osteoarthritis of right knee 01/06/2015   Diabetes ([place]) 01/06/2015   Morbid obesity ([place]) 01/06/2015   Diabetic neuropathy associated with type 2 diabetes mellitus ([place]) 01/05/2010   HEADACHE 05/07/2009   UNSPECIFIED VITAMIN D DEFICIENCY 02/03/2009   LIVER FUNCTION TESTS, ABNORMAL 07/19/2007   HYPERTHYROIDISM 01/04/2007   DIABETES MELLITUS, TYPE II 01/04/2007   HYPERLIPIDEMIA 01/04/2007   ERECTILE DYSFUNCTION 01/04/2007   DEPRESSION 01/04/2007   GERD 01/04/2007   BENIGN PROSTATIC HYPERTROPHY 01/04/2007   OSTEOARTHRITIS 01/04/2007   Current  Outpatient Medications on File Prior to Visit  Medication Sig Dispense Refill   ACCU-CHEK AVIVA PLUS test strip      acetaminophen (TYLENOL) 500 MG tablet Take 1,000 mg by mouth 2 (two) times daily.     atorvastatin (LIPITOR) 40 MG tablet Take 40 mg by mouth at bedtime.      bismuth subsalicylate (PEPTO BISMOL) 262 MG/15ML suspension Take 30 mLs by mouth every 6 (six) hours as needed for indigestion or diarrhea or loose stools.     cetirizine (ZYRTEC) 10 MG tablet Take 10 mg by mouth daily.     cholecalciferol (VITAMIN D) 1000 units tablet Take 1,000 Units by mouth daily.      dextromethorphan-guaiFENesin (MUCINEX DM) 30-600 MG 12hr tablet Take 1 tablet by mouth 2 (two) times daily.     diltiazem (CARDIZEM) 60 MG tablet Take 60 mg by mouth in the morning and at bedtime.     ferrous sulfate 325 (65 FE) MG tablet Take 325 mg by mouth daily with breakfast.     furosemide (LASIX) 20 MG tablet Take 1 tablet (20 mg total) by mouth daily.     gabapentin (NEURONTIN) 100 MG capsule Take 1-2 capsules (100-200 mg total) by mouth See admin instructions. 100mg  in the morning and at noon and 200mg  at bedtime (Patient taking differently: Take 100-200 mg by mouth See admin instructions. 100mg  in the morning, 100 mg in the evening, and 200mg  at bedtime)     glimepiride (AMARYL) 4 MG tablet Take 4 mg by mouth 2 (two) times daily.     lisinopril (PRINIVIL,ZESTRIL) 20  MG tablet Take 5 mg by mouth daily.     loperamide (IMODIUM) 1 MG/5ML solution Take 3 mg by mouth as needed for diarrhea or loose stools.     LORazepam (ATIVAN) 1 MG tablet      memantine (NAMENDA) 10 MG tablet Take 10 mg by mouth 2 (two) times daily.     Multiple Vitamin (MULTIVITAMIN WITH MINERALS) TABS tablet Take 1 tablet by mouth daily.     omeprazole (PRILOSEC) 20 MG capsule Take 20 mg by mouth daily.     potassium chloride (K-DUR) 10 MEQ tablet Take 10 mEq by mouth daily.     potassium chloride (KLOR-CON M) 10 MEQ tablet      propranolol  (INDERAL) 10 MG tablet Take 10 mg by mouth 2 (two) times daily.     rivaroxaban (XARELTO) 20 MG TABS tablet Take 1 tablet (20 mg total) by mouth daily.     sertraline (ZOLOFT) 100 MG tablet      sertraline (ZOLOFT) 50 MG tablet Take 100 mg by mouth daily.     traMADol (ULTRAM) 50 MG tablet Take 50 mg by mouth every 8 (eight) hours as needed for moderate pain.     traZODone (DESYREL) 50 MG tablet Take 50 mg by mouth at bedtime.     VICTOZA 18 MG/3ML SOPN Inject 0.6 mg into the skin daily at 12 noon.      vitamin E 400 UNIT capsule Take 400 Units by mouth daily.     No current facility-administered medications on file prior to visit.   Allergies  Allergen Reactions   Donepezil Diarrhea   Aspirin Diarrhea    Stomach pain    No results found for this or any previous visit (from the past 2160 hour(s)).   Objective: General: Patient is awake, alert, and oriented x 3 and in no acute distress.  Integument: Skin is warm, dry and supple bilateral. Nails are tender, long, thickened and dystrophic with subungual debris, consistent with onychomycosis, 1-5 bilateral. No signs of infection. No open lesions or preulcerative lesions present bilateral. Mild dry skin at heels. Remaining integument unremarkable.  Vasculature:  Dorsalis Pedis pulse 1/4 bilateral. Posterior Tibial pulse  0/4 bilateral. Capillary fill time <5 sec 1-5 bilateral. Scant hair growth to the level of the digits. Temperature gradient within normal limits. No varicosities present bilateral. Trace edema present bilateral.   Neurology: The patient has intact sensation measured with a 5.07/10g Semmes Weinstein Monofilament at all pedal sites bilateral . Vibratory sensation diminished bilateral with tuning fork. Unchanged from prior.   Musculoskeletal: Asymptomatic pes planus and hammertoe pedal deformities noted bilateral. Muscular strength 5/5 in all lower extremity muscular groups bilateral without pain on range of motion.  No  tenderness with calf compression bilateral.  Assessment and Plan: Problem List Items Addressed This Visit       Endocrine   Diabetic neuropathy associated with type 2 diabetes mellitus ([place])   Other Visit Diagnoses     Pain due to onychomycosis of toenails of both feet    -  Primary   PVD (peripheral vascular disease) ([place])           -Examined patient. -Re-Discussed and educated patient on diabetic foot care, especially with  regards to the vascular, neurological and musculoskeletal systems.  -Mechanically debrided all nails 1-5 bilateral using sterile nail nipper and filed with dremel without incident  -Patient to return  in 3 months for at risk foot care -Patient advised to call the office if  any problems or questions arise in the meantime.  Asencion Islam, DPM

## 2021-09-07 DIAGNOSIS — E785 Hyperlipidemia, unspecified: Secondary | ICD-10-CM | POA: Diagnosis not present

## 2021-09-07 DIAGNOSIS — I10 Essential (primary) hypertension: Secondary | ICD-10-CM | POA: Diagnosis not present

## 2021-09-07 DIAGNOSIS — Z7689 Persons encountering health services in other specified circumstances: Secondary | ICD-10-CM | POA: Diagnosis not present

## 2021-09-07 DIAGNOSIS — R1310 Dysphagia, unspecified: Secondary | ICD-10-CM | POA: Diagnosis not present

## 2021-09-07 DIAGNOSIS — E1169 Type 2 diabetes mellitus with other specified complication: Secondary | ICD-10-CM | POA: Diagnosis not present

## 2021-09-07 DIAGNOSIS — R0781 Pleurodynia: Secondary | ICD-10-CM | POA: Diagnosis not present

## 2021-09-08 DIAGNOSIS — Z862 Personal history of diseases of the blood and blood-forming organs and certain disorders involving the immune mechanism: Secondary | ICD-10-CM | POA: Diagnosis not present

## 2021-09-08 DIAGNOSIS — E1169 Type 2 diabetes mellitus with other specified complication: Secondary | ICD-10-CM | POA: Diagnosis not present

## 2021-09-16 DIAGNOSIS — M47814 Spondylosis without myelopathy or radiculopathy, thoracic region: Secondary | ICD-10-CM | POA: Diagnosis not present

## 2021-09-16 DIAGNOSIS — R0781 Pleurodynia: Secondary | ICD-10-CM | POA: Diagnosis not present

## 2021-09-16 DIAGNOSIS — G8911 Acute pain due to trauma: Secondary | ICD-10-CM | POA: Diagnosis not present

## 2021-09-16 DIAGNOSIS — I517 Cardiomegaly: Secondary | ICD-10-CM | POA: Diagnosis not present

## 2021-09-20 ENCOUNTER — Encounter (HOSPITAL_COMMUNITY): Payer: Self-pay | Admitting: *Deleted

## 2021-09-20 ENCOUNTER — Other Ambulatory Visit: Payer: Self-pay

## 2021-09-20 ENCOUNTER — Emergency Department (HOSPITAL_COMMUNITY)
Admission: EM | Admit: 2021-09-20 | Discharge: 2021-09-20 | Disposition: A | Discharge status: Home / Self Care | Payer: Medicare (Managed Care) | Attending: Emergency Medicine | Admitting: Emergency Medicine

## 2021-09-20 ENCOUNTER — Emergency Department (HOSPITAL_COMMUNITY): Payer: Medicare (Managed Care)

## 2021-09-20 DIAGNOSIS — R109 Unspecified abdominal pain: Secondary | ICD-10-CM | POA: Insufficient documentation

## 2021-09-20 DIAGNOSIS — R1110 Vomiting, unspecified: Secondary | ICD-10-CM | POA: Insufficient documentation

## 2021-09-20 DIAGNOSIS — R110 Nausea: Secondary | ICD-10-CM | POA: Diagnosis not present

## 2021-09-20 DIAGNOSIS — Z7901 Long term (current) use of anticoagulants: Secondary | ICD-10-CM | POA: Diagnosis not present

## 2021-09-20 DIAGNOSIS — Z743 Need for continuous supervision: Secondary | ICD-10-CM | POA: Diagnosis not present

## 2021-09-20 DIAGNOSIS — R112 Nausea with vomiting, unspecified: Secondary | ICD-10-CM | POA: Diagnosis not present

## 2021-09-20 DIAGNOSIS — R1111 Vomiting without nausea: Secondary | ICD-10-CM | POA: Diagnosis not present

## 2021-09-20 DIAGNOSIS — I700 Atherosclerosis of aorta: Secondary | ICD-10-CM | POA: Diagnosis not present

## 2021-09-20 LAB — URINALYSIS, ROUTINE W REFLEX MICROSCOPIC
Bacteria, UA: NONE SEEN
Bilirubin Urine: NEGATIVE
Glucose, UA: NEGATIVE mg/dL
Hgb urine dipstick: NEGATIVE
Ketones, ur: NEGATIVE mg/dL
Leukocytes,Ua: NEGATIVE
Nitrite: NEGATIVE
Protein, ur: NEGATIVE mg/dL
Specific Gravity, Urine: 1.014 (ref 1.005–1.030)
pH: 5 (ref 5.0–8.0)

## 2021-09-20 LAB — CBC
HCT: 41.2 % (ref 39.0–52.0)
Hemoglobin: 13.5 g/dL (ref 13.0–17.0)
MCH: 31 pg (ref 26.0–34.0)
MCHC: 32.8 g/dL (ref 30.0–36.0)
MCV: 94.7 fL (ref 80.0–100.0)
Platelets: 171 10*3/uL (ref 150–400)
RBC: 4.35 MIL/uL (ref 4.22–5.81)
RDW: 14.3 % (ref 11.5–15.5)
WBC: 6.1 10*3/uL (ref 4.0–10.5)
nRBC: 0 % (ref 0.0–0.2)

## 2021-09-20 LAB — COMPREHENSIVE METABOLIC PANEL
ALT: 32 U/L (ref 0–44)
AST: 28 U/L (ref 15–41)
Albumin: 3.9 g/dL (ref 3.5–5.0)
Alkaline Phosphatase: 72 U/L (ref 38–126)
Anion gap: 6 (ref 5–15)
BUN: 10 mg/dL (ref 8–23)
CO2: 28 mmol/L (ref 22–32)
Calcium: 9 mg/dL (ref 8.9–10.3)
Chloride: 102 mmol/L (ref 98–111)
Creatinine, Ser: 1 mg/dL (ref 0.61–1.24)
GFR, Estimated: >60 mL/min (ref >60)
Glucose, Bld: 112 mg/dL — ABNORMAL HIGH (ref 70–99)
Potassium: 4.1 mmol/L (ref 3.5–5.1)
Sodium: 136 mmol/L (ref 135–145)
Total Bilirubin: 0.5 mg/dL (ref 0.3–1.2)
Total Protein: 7.4 g/dL (ref 6.5–8.1)

## 2021-09-20 LAB — LIPASE, BLOOD: Lipase: 33 U/L (ref 11–51)

## 2021-09-20 NOTE — ED Notes (Signed)
Pt in CT at this time.

## 2021-09-20 NOTE — ED Triage Notes (Addendum)
BIB [facility] due to a week of abd pain and constipation, vomiting at intervals, pt is from home. Reports normal BM yesterday, pain is in upper abd area.  ? ? ?Pt states his pain is in the lower abd. Verifies BM yesterday. ?

## 2021-09-20 NOTE — ED Provider Notes (Signed)
?[facility]-EMERGENCY DEPT ?Provider Note ? ? ?CSN: [id number] ?Arrival date & time: 09/20/21  1356 ? ?  ? ?History ? ?Chief Complaint  ?Patient presents with  ? Abdominal Pain  ? Constipation  ? ? ?Waldron Gerry is a 81 y.o. male. ? ?81 year old male presents with abdominal discomfort.  States it began yesterday and has been associated with nonbilious/bloody emesis.  No fever or chills.  No urinary symptoms.  States his last BM was several days ago.  Denies any prior history of bowel obstruction.  Called EMS and was transported here ? ? ?  ? ?Home Medications ?Prior to Admission medications   ?Medication Sig Start Date End Date Taking? Authorizing Provider  ?ACCU-CHEK AVIVA PLUS test strip  03/13/21   [provider]  ?acetaminophen (TYLENOL) 500 MG tablet Take 1,000 mg by mouth 2 (two) times daily.    [provider]  ?atorvastatin (LIPITOR) 40 MG tablet Take 40 mg by mouth at bedtime.     [provider]  ?bismuth subsalicylate (PEPTO BISMOL) 262 MG/15ML suspension Take 30 mLs by mouth every 6 (six) hours as needed for indigestion or diarrhea or loose stools.    [provider]  ?cetirizine (ZYRTEC) 10 MG tablet Take 10 mg by mouth daily.    [provider]  ?cholecalciferol (VITAMIN D) 1000 units tablet Take 1,000 Units by mouth daily.     [provider]  ?dextromethorphan-guaiFENesin (MUCINEX DM) 30-600 MG 12hr tablet Take 1 tablet by mouth 2 (two) times daily.    [provider]  ?diltiazem (CARDIZEM) 60 MG tablet Take 60 mg by mouth in the morning and at bedtime.    [provider]  ?ferrous sulfate 325 (65 FE) MG tablet Take 325 mg by mouth daily with breakfast.    [provider]  ?furosemide (LASIX) 20 MG tablet Take 1 tablet (20 mg total) by mouth daily. 12/04/17   Glade Lloyd, MD  ?gabapentin (NEURONTIN) 100 MG capsule Take 1-2 capsules (100-200 mg total) by mouth See admin instructions.  in the  morning and at noon and  at bedtime ?Patient taking differently: Take 100-200 mg by mouth See admin instructions.  in the morning, 100 mg in the evening, and  at bedtime 12/04/17   Glade Lloyd, MD  ?glimepiride (AMARYL) 4 MG tablet Take 4 mg by mouth 2 (two) times daily.    [provider]  ?lisinopril (PRINIVIL,ZESTRIL) 20 MG tablet Take 5 mg by mouth daily. 07/31/18   [provider]  ?loperamide (IMODIUM) 1 MG/5ML solution Take 3 mg by mouth as needed for diarrhea or loose stools.    [provider]  ?LORazepam (ATIVAN) 1 MG tablet  02/17/21   [provider]  ?memantine (NAMENDA) 10 MG tablet Take 10 mg by mouth 2 (two) times daily. 10/02/19   [provider]  ?Multiple Vitamin (MULTIVITAMIN WITH MINERALS) TABS tablet Take 1 tablet by mouth daily.    [provider]  ?omeprazole (PRILOSEC) 20 MG capsule Take 20 mg by mouth daily.    [provider]  ?potassium chloride (K-DUR) 10 MEQ tablet Take 10 mEq by mouth daily. 10/22/16   [provider]  ?potassium chloride (KLOR-CON M) 10 MEQ tablet  03/16/21   [provider]  ?propranolol (INDERAL) 10 MG tablet Take 10 mg by mouth 2 (two) times daily. 08/21/19   [provider]  ?rivaroxaban (XARELTO) 20 MG TABS tablet Take 1 tablet (20 mg total) by mouth daily. 12/04/17  Glade Lloyd, MD  ?sertraline (ZOLOFT) 100 MG tablet  04/27/21   [provider]  ?sertraline (ZOLOFT) 50 MG tablet Take 100 mg by mouth daily. 10/21/16   [provider]  ?traMADol (ULTRAM) 50 MG tablet Take 50 mg by mouth every 8 (eight) hours as needed for moderate pain.    [provider]  ?traZODone (DESYREL) 50 MG tablet Take 50 mg by mouth at bedtime.    [provider]  ?VICTOZA 18 MG/3ML SOPN Inject 0.6 mg into the skin daily at 12 noon.  06/19/18   [provider]  ?vitamin E 400 UNIT capsule Take 400 Units by mouth daily.    [provider]  ?   ? ?Allergies    ?Donepezil and Aspirin   ? ?Review of Systems   ?Review of Systems  ?All other systems reviewed and are negative. ? ?Physical Exam ?Updated Vital Signs ?BP (!) 179/90   Pulse (!) 53   Temp 97.9 ?F (36.6 ?C) (Oral)   Resp 20   Ht 1.854 m ( )   Wt 112 kg   SpO2 99%   BMI 32.59 kg/m?  ?Physical Exam ?Vitals and nursing note reviewed.  ?Constitutional:   ?   General: He is not in acute distress. ?   Appearance: Normal appearance. He is well-developed. He is not toxic-appearing.  ?HENT:  ?   Head: Normocephalic and atraumatic.  ?Eyes:  ?   General: Lids are normal.  ?   Conjunctiva/sclera: Conjunctivae normal.  ?   Pupils: Pupils are equal, round, and reactive to light.  ?Neck:  ?   Thyroid: No thyroid mass.  ?   Trachea: No tracheal deviation.  ?Cardiovascular:  ?   Rate and Rhythm: Normal rate and regular rhythm.  ?   Heart sounds: Normal heart sounds. No murmur heard. ?  No gallop.  ?Pulmonary:  ?   Effort: Pulmonary effort is normal. No respiratory distress.  ?   Breath sounds: Normal breath sounds. No stridor. No decreased breath sounds, wheezing, rhonchi or rales.  ?Abdominal:  ?   General: There is no distension.  ?   Palpations: Abdomen is soft.  ?   Tenderness: There is no abdominal tenderness. There is no rebound.  ?Musculoskeletal:     ?   General: No tenderness. Normal range of motion.  ?   Cervical back: Normal range of motion and neck supple.  ?Skin: ?   General: Skin is warm and dry.  ?   Findings: No abrasion or rash.  ?Neurological:  ?   Mental Status: He is alert and oriented to person, place, and time.  ?   GCS: GCS eye subscore is 4. GCS verbal subscore is 5. GCS motor subscore is 6.  ?   Cranial Nerves: No cranial nerve deficit.  ?   Sensory: No sensory deficit.  ?   Motor: Motor function is intact.  ?Psychiatric:     ?   Attention and Perception: Attention normal.     ?   Speech: Speech normal.     ?   Behavior: Behavior normal.  ? ? ?ED Results / Procedures /  Treatments   ?Labs ?(all labs ordered are listed, but only abnormal results are displayed) ?Labs Reviewed  ?CBC  ?LIPASE, BLOOD  ?COMPREHENSIVE METABOLIC PANEL  ?URINALYSIS, ROUTINE W REFLEX MICROSCOPIC  ? ? ?EKG ?None ? ?Radiology ?No results found. ? ?Procedures ?Procedures  ? ? ?Medications Ordered in ED ?Medications - No data  to display ? ?ED Course/ Medical Decision Making/ A&P ?  ?                        ?Medical Decision Making ?Amount and/or Complexity of Data Reviewed ?Labs: ordered. ?Radiology: ordered. ? ? ?Patient presented with abdominal pain and concern for constipation.  No emesis noted here.  Labs are reassuring.  Abdominal CT per my review interpretation shows no signs of obstruction.  No acute pathology noted.  Patient has not had any emesis here.  Urinalysis without acute findings.  Discussed with patient's wife, patient will be discharged with return precautions ? ? ? ? ? ? ? ?Final Clinical Impression(s) / ED Diagnoses ?Final diagnoses:  ?None  ? ? ?Rx / DC Orders ?ED Discharge Orders   ? ? None  ? ?  ? ? ?  ?Lorre NickAllen, Darris Carachure, MD ?09/20/21 1658 ? ?

## 2021-09-24 ENCOUNTER — Other Ambulatory Visit: Payer: Self-pay

## 2021-09-24 ENCOUNTER — Ambulatory Visit: Payer: Medicare HMO | Admitting: Sports Medicine

## 2021-09-24 ENCOUNTER — Ambulatory Visit (INDEPENDENT_AMBULATORY_CARE_PROVIDER_SITE_OTHER): Payer: Medicare (Managed Care) | Admitting: Sports Medicine

## 2021-09-24 ENCOUNTER — Encounter: Payer: Self-pay | Admitting: Sports Medicine

## 2021-09-24 DIAGNOSIS — B351 Tinea unguium: Secondary | ICD-10-CM | POA: Diagnosis not present

## 2021-09-24 DIAGNOSIS — E1142 Type 2 diabetes mellitus with diabetic polyneuropathy: Secondary | ICD-10-CM

## 2021-09-24 DIAGNOSIS — M79674 Pain in right toe(s): Secondary | ICD-10-CM | POA: Diagnosis not present

## 2021-09-24 DIAGNOSIS — M2141 Flat foot [pes planus] (acquired), right foot: Secondary | ICD-10-CM

## 2021-09-24 DIAGNOSIS — M2041 Other hammer toe(s) (acquired), right foot: Secondary | ICD-10-CM

## 2021-09-24 DIAGNOSIS — M2042 Other hammer toe(s) (acquired), left foot: Secondary | ICD-10-CM

## 2021-09-24 DIAGNOSIS — L853 Xerosis cutis: Secondary | ICD-10-CM

## 2021-09-24 DIAGNOSIS — M2142 Flat foot [pes planus] (acquired), left foot: Secondary | ICD-10-CM

## 2021-09-24 DIAGNOSIS — M79675 Pain in left toe(s): Secondary | ICD-10-CM

## 2021-09-24 DIAGNOSIS — I739 Peripheral vascular disease, unspecified: Secondary | ICD-10-CM

## 2021-09-24 NOTE — Progress Notes (Signed)
Subjective: ?Andrew Winters is a 80 y.o. male patient with history of diabetes who returns to office today complaining of long,mildly painful nails  while ambulating in shoes; unable to trim.  Denies any complaints at this time. ? ?Patient is assisted by wife this visit who helps to report history and states that she has not been able to keep cream on his feet and legs because she has broken her wrist. ? ?FBS not recorded this visit ?A1c unknown ?PCP Dr. Manson Allan was in December 2022 however recently switched to Dr. Nyra Capes because of insurance ? ?Patient Active Problem List  ? Diagnosis Date Noted  ? Achalasia   ? Chronic anticoagulation   ? Gastritis and gastroduodenitis   ? Severe sepsis ([place]) 09/26/2017  ? UTI (urinary tract infection) 09/26/2017  ? Abnormal loss of weight   ? History of snoring 09/20/2016  ? Chronic diarrhea 09/20/2016  ? Urinary tract infection without hematuria   ? Vascular dementia without behavioral disturbance ([place])   ? Hypokalemia   ? Dysphagia   ? Chest pain 09/11/2016  ? Hammer toes, bilateral 08/18/2016  ? Pulmonary emboli ([place]) 05/20/2016  ? Acute saddle pulmonary embolism without acute cor pulmonale ([place]) 05/20/2016  ? Onychomycosis due to dermatophyte 12/29/2015  ? Pseudogout of right knee 01/13/2015  ? DVT of upper extremity (deep vein thrombosis): LEFT 01/11/2015  ? Anemia, iron deficiency 01/11/2015  ? Postoperative fever   ? Fever 01/08/2015  ? Diabetes mellitus type 2, controlled ([place]) 01/07/2015  ? Essential hypertension 01/07/2015  ? SIRS (systemic inflammatory response syndrome) ([place]) 01/07/2015  ? Acute encephalopathy   ? Primary osteoarthritis of right knee 01/06/2015  ? Diabetes ([place]) 01/06/2015  ? Morbid obesity ([place]) 01/06/2015  ? Diabetic neuropathy associated with type 2 diabetes mellitus ([place]) 01/05/2010  ? HEADACHE 05/07/2009  ? UNSPECIFIED VITAMIN D DEFICIENCY 02/03/2009  ? LIVER FUNCTION TESTS, ABNORMAL 07/19/2007  ? HYPERTHYROIDISM 01/04/2007  ? DIABETES MELLITUS,  TYPE II 01/04/2007  ? HYPERLIPIDEMIA 01/04/2007  ? ERECTILE DYSFUNCTION 01/04/2007  ? DEPRESSION 01/04/2007  ? GERD 01/04/2007  ? BENIGN PROSTATIC HYPERTROPHY 01/04/2007  ? OSTEOARTHRITIS 01/04/2007  ? ?Current Outpatient Medications on File Prior to Visit  ?Medication Sig Dispense Refill  ? ACCU-CHEK AVIVA PLUS test strip     ? acetaminophen (TYLENOL) 500 MG tablet Take 1,000 mg by mouth 2 (two) times daily.    ? Alcohol Swabs (DROPSAFE ALCOHOL PREP) 70 % PADS     ? atorvastatin (LIPITOR) 40 MG tablet Take 40 mg by mouth at bedtime.     ? bismuth subsalicylate (PEPTO BISMOL) 262 MG/15ML suspension Take 30 mLs by mouth every 6 (six) hours as needed for indigestion or diarrhea or loose stools.    ? cetirizine (ZYRTEC) 10 MG tablet Take 10 mg by mouth daily.    ? cholecalciferol (VITAMIN D) 1000 units tablet Take 1,000 Units by mouth daily.     ? dextromethorphan-guaiFENesin (MUCINEX DM) 30-600 MG 12hr tablet Take 1 tablet by mouth 2 (two) times daily.    ? diltiazem (CARDIZEM) 60 MG tablet Take 60 mg by mouth in the morning and at bedtime.    ? ferrous sulfate 325 (65 FE) MG tablet Take 325 mg by mouth daily with breakfast.    ? furosemide (LASIX) 20 MG tablet Take 1 tablet (20 mg total) by mouth daily.    ? gabapentin (NEURONTIN) 100 MG capsule Take 1-2 capsules (100-200 mg total) by mouth See admin instructions. 100mg  in the morning and at noon and  200mg  at bedtime (Patient taking differently: Take 100-200 mg by mouth See admin instructions. 100mg  in the morning, 100 mg in the evening, and 200mg  at bedtime)    ? glimepiride (AMARYL) 4 MG tablet Take 4 mg by mouth 2 (two) times daily.    ? lisinopril (PRINIVIL,ZESTRIL) 20 MG tablet Take 5 mg by mouth daily.    ? loperamide (IMODIUM) 1 MG/5ML solution Take 3 mg by mouth as needed for diarrhea or loose stools.    ? LORazepam (ATIVAN) 1 MG tablet     ? memantine (NAMENDA) 10 MG tablet Take 10 mg by mouth 2 (two) times daily.    ? Multiple Vitamin (MULTIVITAMIN WITH  MINERALS) TABS tablet Take 1 tablet by mouth daily.    ? omeprazole (PRILOSEC) 20 MG capsule Take 20 mg by mouth daily.    ? potassium chloride (K-DUR) 10 MEQ tablet Take 10 mEq by mouth daily.    ? potassium chloride (KLOR-CON M) 10 MEQ tablet     ? propranolol (INDERAL) 10 MG tablet Take 10 mg by mouth 2 (two) times daily.    ? rivaroxaban (XARELTO) 20 MG TABS tablet Take 1 tablet (20 mg total) by mouth daily.    ? sertraline (ZOLOFT) 100 MG tablet     ? sertraline (ZOLOFT) 50 MG tablet Take 100 mg by mouth daily.    ? traMADol (ULTRAM) 50 MG tablet Take 50 mg by mouth every 8 (eight) hours as needed for moderate pain.    ? traZODone (DESYREL) 50 MG tablet Take 50 mg by mouth at bedtime.    ? VICTOZA 18 MG/3ML SOPN Inject 0.6 mg into the skin daily at 12 noon.     ? vitamin E 400 UNIT capsule Take 400 Units by mouth daily.    ? ?No current facility-administered medications on file prior to visit.  ? ?Allergies  ?Allergen Reactions  ? Donepezil Diarrhea  ? Aspirin Diarrhea  ?  Stomach pain  ? ? ?Recent Results (from the past 2160 hour(s))  ?Lipase, blood     Status: None  ? Collection Time: 09/20/21  2:10 PM  ?Result Value Ref Range  ? Lipase 33 11 - 51 U/L  ?  Comment: Performed at [facility], [place] [address] [place], [place] [address]  ?Comprehensive metabolic panel     Status: Abnormal  ? Collection Time: 09/20/21  2:10 PM  ?Result Value Ref Range  ? Sodium 136 135 - 145 mmol/L  ? Potassium 4.1 3.5 - 5.1 mmol/L  ? Chloride 102 98 - 111 mmol/L  ? CO2 28 22 - 32 mmol/L  ? Glucose, Bld 112 (H) 70 - 99 mg/dL  ?  Comment: Glucose reference range applies only to samples taken after fasting for at least 8 hours.  ? BUN 10 8 - 23 mg/dL  ? Creatinine, Ser 1.00 0.61 - 1.24 mg/dL  ? Calcium 9.0 8.9 - 10.3 mg/dL  ? Total Protein 7.4 6.5 - 8.1 g/dL  ? Albumin 3.9 3.5 - 5.0 g/dL  ? AST 28 15 - 41 U/L  ? ALT 32 0 - 44 U/L  ? Alkaline Phosphatase 72 38 - 126 U/L  ? Total Bilirubin 0.5 0.3 - 1.2 mg/dL  ?  GFR, Estimated >60 >60 mL/min  ?  Comment: (NOTE) ?Calculated using the CKD-EPI Creatinine Equation (2021) ?  ? Anion gap 6 5 - 15  ?  Comment: Performed at [facility], [place] [address] [place], [place] [address]  ?  CBC     Status: None  ? Collection Time: 09/20/21  2:10 PM  ?Result Value Ref Range  ? WBC 6.1 4.0 - 10.5 K/uL  ? RBC 4.35 4.22 - 5.81 MIL/uL  ? Hemoglobin 13.5 13.0 - 17.0 g/dL  ? HCT 41.2 39.0 - 52.0 %  ? MCV 94.7 80.0 - 100.0 fL  ? MCH 31.0 26.0 - 34.0 pg  ? MCHC 32.8 30.0 - 36.0 g/dL  ? RDW 14.3 11.5 - 15.5 %  ? Platelets 171 150 - 400 K/uL  ? nRBC 0.0 0.0 - 0.2 %  ?  Comment: Performed at [facility], [place] [address] [place], [place] [address]  ?Urinalysis, Routine w reflex microscopic     Status: None  ? Collection Time: 09/20/21  2:10 PM  ?Result Value Ref Range  ? Color, Urine YELLOW YELLOW  ? APPearance CLEAR CLEAR  ? Specific Gravity, Urine 1.014 1.005 - 1.030  ? pH 5.0 5.0 - 8.0  ? Glucose, UA NEGATIVE NEGATIVE mg/dL  ? Hgb urine dipstick NEGATIVE NEGATIVE  ? Bilirubin Urine NEGATIVE NEGATIVE  ? Ketones, ur NEGATIVE NEGATIVE mg/dL  ? Protein, ur NEGATIVE NEGATIVE mg/dL  ? Nitrite NEGATIVE NEGATIVE  ? Leukocytes,Ua NEGATIVE NEGATIVE  ? RBC / HPF 0-5 0 - 5 RBC/hpf  ? WBC, UA 0-5 0 - 5 WBC/hpf  ? Bacteria, UA NONE SEEN NONE SEEN  ? Squamous Epithelial / LPF 0-5 0 - 5  ? Mucus PRESENT   ?  Comment: Performed at [facility], [place] [address] [place], [place] [address]  ? ? ? ?Objective: ?General: Patient is awake, alert, and oriented x 3 and in no acute distress. ? ?Integument: Skin is warm, dry and supple bilateral. Nails are tender, long, thickened and dystrophic with subungual debris, consistent with onychomycosis, 1-5 bilateral. No signs of infection. No open lesions or preulcerative lesions present bilateral. Mild dry skin at heels and diffuse plantar surfaces. Remaining integument unremarkable. ? ?Vasculature:  Dorsalis Pedis pulse 1/4  bilateral. Posterior Tibial pulse  0/4 bilateral. Capillary fill time <5 sec 1-5 bilateral. Scant hair growth to the level of the digits. Temperature gradient within normal limits.  Chronic edema and troph

## 2021-09-30 ENCOUNTER — Telehealth: Payer: Self-pay

## 2021-09-30 NOTE — Telephone Encounter (Signed)
Returned call to pt to schedule diabetic shoe eval. Lvm for pt to call back

## 2021-10-04 DIAGNOSIS — E1169 Type 2 diabetes mellitus with other specified complication: Secondary | ICD-10-CM | POA: Diagnosis not present

## 2021-10-04 DIAGNOSIS — Z86718 Personal history of other venous thrombosis and embolism: Secondary | ICD-10-CM | POA: Diagnosis not present

## 2021-10-04 DIAGNOSIS — R079 Chest pain, unspecified: Secondary | ICD-10-CM | POA: Diagnosis not present

## 2021-10-04 DIAGNOSIS — Z789 Other specified health status: Secondary | ICD-10-CM | POA: Diagnosis not present

## 2021-10-04 DIAGNOSIS — I10 Essential (primary) hypertension: Secondary | ICD-10-CM | POA: Diagnosis not present

## 2021-10-04 DIAGNOSIS — E785 Hyperlipidemia, unspecified: Secondary | ICD-10-CM | POA: Diagnosis not present

## 2021-10-07 ENCOUNTER — Encounter: Payer: Self-pay | Admitting: Cardiology

## 2021-10-07 DIAGNOSIS — R079 Chest pain, unspecified: Secondary | ICD-10-CM | POA: Diagnosis not present

## 2021-10-07 DIAGNOSIS — Q219 Congenital malformation of cardiac septum, unspecified: Secondary | ICD-10-CM | POA: Diagnosis not present

## 2021-10-25 DIAGNOSIS — Z6836 Body mass index (BMI) 36.0-36.9, adult: Secondary | ICD-10-CM | POA: Diagnosis not present

## 2021-10-25 DIAGNOSIS — I252 Old myocardial infarction: Secondary | ICD-10-CM | POA: Diagnosis not present

## 2021-10-25 DIAGNOSIS — R42 Dizziness and giddiness: Secondary | ICD-10-CM | POA: Diagnosis not present

## 2021-10-25 DIAGNOSIS — R079 Chest pain, unspecified: Secondary | ICD-10-CM | POA: Diagnosis not present

## 2021-11-08 DIAGNOSIS — E785 Hyperlipidemia, unspecified: Secondary | ICD-10-CM | POA: Diagnosis not present

## 2021-11-08 DIAGNOSIS — I252 Old myocardial infarction: Secondary | ICD-10-CM | POA: Diagnosis not present

## 2021-11-10 DIAGNOSIS — R42 Dizziness and giddiness: Secondary | ICD-10-CM | POA: Diagnosis not present

## 2021-11-10 DIAGNOSIS — I959 Hypotension, unspecified: Secondary | ICD-10-CM | POA: Diagnosis not present

## 2021-11-10 DIAGNOSIS — Z6837 Body mass index (BMI) 37.0-37.9, adult: Secondary | ICD-10-CM | POA: Diagnosis not present

## 2021-12-01 DIAGNOSIS — Z1339 Encounter for screening examination for other mental health and behavioral disorders: Secondary | ICD-10-CM | POA: Diagnosis not present

## 2021-12-01 DIAGNOSIS — R42 Dizziness and giddiness: Secondary | ICD-10-CM | POA: Diagnosis not present

## 2021-12-01 DIAGNOSIS — Z0000 Encounter for general adult medical examination without abnormal findings: Secondary | ICD-10-CM | POA: Diagnosis not present

## 2021-12-01 DIAGNOSIS — Z1389 Encounter for screening for other disorder: Secondary | ICD-10-CM | POA: Diagnosis not present

## 2021-12-01 DIAGNOSIS — I10 Essential (primary) hypertension: Secondary | ICD-10-CM | POA: Diagnosis not present

## 2021-12-01 DIAGNOSIS — Z7189 Other specified counseling: Secondary | ICD-10-CM | POA: Diagnosis not present

## 2021-12-01 DIAGNOSIS — Z136 Encounter for screening for cardiovascular disorders: Secondary | ICD-10-CM | POA: Diagnosis not present

## 2021-12-01 DIAGNOSIS — Z6838 Body mass index (BMI) 38.0-38.9, adult: Secondary | ICD-10-CM | POA: Diagnosis not present

## 2021-12-01 DIAGNOSIS — Z1331 Encounter for screening for depression: Secondary | ICD-10-CM | POA: Diagnosis not present

## 2021-12-01 DIAGNOSIS — Z139 Encounter for screening, unspecified: Secondary | ICD-10-CM | POA: Diagnosis not present

## 2021-12-08 DIAGNOSIS — I10 Essential (primary) hypertension: Secondary | ICD-10-CM | POA: Diagnosis not present

## 2021-12-09 DIAGNOSIS — I252 Old myocardial infarction: Secondary | ICD-10-CM | POA: Diagnosis not present

## 2021-12-09 DIAGNOSIS — E785 Hyperlipidemia, unspecified: Secondary | ICD-10-CM | POA: Diagnosis not present

## 2022-01-06 ENCOUNTER — Encounter: Payer: Self-pay | Admitting: Podiatry

## 2022-01-06 ENCOUNTER — Ambulatory Visit: Payer: Medicare (Managed Care) | Admitting: Podiatry

## 2022-01-06 DIAGNOSIS — M79674 Pain in right toe(s): Secondary | ICD-10-CM | POA: Diagnosis not present

## 2022-01-06 DIAGNOSIS — M79675 Pain in left toe(s): Secondary | ICD-10-CM | POA: Diagnosis not present

## 2022-01-06 DIAGNOSIS — B351 Tinea unguium: Secondary | ICD-10-CM | POA: Diagnosis not present

## 2022-01-06 DIAGNOSIS — E1142 Type 2 diabetes mellitus with diabetic polyneuropathy: Secondary | ICD-10-CM | POA: Diagnosis not present

## 2022-01-07 DIAGNOSIS — I10 Essential (primary) hypertension: Secondary | ICD-10-CM | POA: Diagnosis not present

## 2022-01-07 DIAGNOSIS — E1169 Type 2 diabetes mellitus with other specified complication: Secondary | ICD-10-CM | POA: Diagnosis not present

## 2022-01-08 DIAGNOSIS — E785 Hyperlipidemia, unspecified: Secondary | ICD-10-CM | POA: Diagnosis not present

## 2022-01-08 DIAGNOSIS — I252 Old myocardial infarction: Secondary | ICD-10-CM | POA: Diagnosis not present

## 2022-01-16 NOTE — Progress Notes (Signed)
  Subjective:  Patient ID: Andrew Winters, male    DOB: [birth year],  MRN: [id number]  Andrew Winters presents to clinic today for for at risk foot care. Patient has h/o PAD and painful elongated mycotic toenails 1-5 bilaterally which are tender when wearing enclosed shoe gear. Pain is relieved with periodic professional debridement.  New problem(s): None.   PCP is Andrew Rakes, MD , and last visit was  December 20, 2021  Allergies  Allergen Reactions   Donepezil Diarrhea   Aspirin Diarrhea    Stomach pain    Review of Systems: Negative except as noted in the HPI.  Objective: No changes noted in today's physical examination.  Vascular Examination: CFT <4 seconds b/l. DP pulses faintly palpable b/l. PT pulses diminished b/l. Digital hair absent. Skin temperature gradient warm to warm b/l. No ischemia or gangrene. No cyanosis or clubbing noted b/l. Nonpitting edema noted BLE.   Neurological Examination: Pt has subjective symptoms of neuropathy. Sensation grossly intact b/l with 10 gram monofilament. Vibratory sensation intact b/l.   Dermatological Examination: Pedal skin warm and supple b/l. Toenails 1-5 b/l thick, discolored, elongated with subungual debris and pain on dorsal palpation.  No open wounds b/l LE. No interdigital macerations noted b/l LE.  Musculoskeletal Examination: Muscle strength 5/5 to b/l LE. Hammertoe deformity noted 2-5 b/l. Pes planus deformity noted bilateral LE.  Radiographs: None  Assessment/Plan: 1. Pain due to onychomycosis of toenails of both feet   2. Diabetic polyneuropathy associated with type 2 diabetes mellitus (HCC)     -Patient was evaluated and treated. All patient's and/or POA's questions/concerns answered on today's visit. -Continue foot and shoe inspections daily. Monitor blood glucose per PCP/Endocrinologist's recommendations. -Patient to continue soft, supportive shoe gear daily. -Mycotic toenails 1-5 bilaterally were debrided in length  and girth with sterile nail nippers and dremel without incident. -Patient/POA to call should there be question/concern in the interim.   Return in about 3 months (around 04/08/2022).  Freddie Breech, DPM

## 2022-02-08 DIAGNOSIS — I252 Old myocardial infarction: Secondary | ICD-10-CM | POA: Diagnosis not present

## 2022-02-08 DIAGNOSIS — E785 Hyperlipidemia, unspecified: Secondary | ICD-10-CM | POA: Diagnosis not present

## 2022-02-22 DIAGNOSIS — I10 Essential (primary) hypertension: Secondary | ICD-10-CM | POA: Diagnosis not present

## 2022-02-22 DIAGNOSIS — Z862 Personal history of diseases of the blood and blood-forming organs and certain disorders involving the immune mechanism: Secondary | ICD-10-CM | POA: Diagnosis not present

## 2022-02-22 DIAGNOSIS — E1169 Type 2 diabetes mellitus with other specified complication: Secondary | ICD-10-CM | POA: Diagnosis not present

## 2022-03-30 DIAGNOSIS — I700 Atherosclerosis of aorta: Secondary | ICD-10-CM | POA: Diagnosis not present

## 2022-03-30 DIAGNOSIS — Z6837 Body mass index (BMI) 37.0-37.9, adult: Secondary | ICD-10-CM | POA: Diagnosis not present

## 2022-03-30 DIAGNOSIS — E1169 Type 2 diabetes mellitus with other specified complication: Secondary | ICD-10-CM | POA: Diagnosis not present

## 2022-03-30 DIAGNOSIS — Y92009 Unspecified place in unspecified non-institutional (private) residence as the place of occurrence of the external cause: Secondary | ICD-10-CM | POA: Diagnosis not present

## 2022-03-30 DIAGNOSIS — E785 Hyperlipidemia, unspecified: Secondary | ICD-10-CM | POA: Diagnosis not present

## 2022-03-30 DIAGNOSIS — R4189 Other symptoms and signs involving cognitive functions and awareness: Secondary | ICD-10-CM | POA: Diagnosis not present

## 2022-03-30 DIAGNOSIS — R9431 Abnormal electrocardiogram [ECG] [EKG]: Secondary | ICD-10-CM | POA: Diagnosis not present

## 2022-03-30 DIAGNOSIS — G3184 Mild cognitive impairment, so stated: Secondary | ICD-10-CM | POA: Diagnosis not present

## 2022-03-30 DIAGNOSIS — I2510 Atherosclerotic heart disease of native coronary artery without angina pectoris: Secondary | ICD-10-CM | POA: Diagnosis not present

## 2022-03-30 DIAGNOSIS — N400 Enlarged prostate without lower urinary tract symptoms: Secondary | ICD-10-CM | POA: Diagnosis not present

## 2022-03-30 DIAGNOSIS — Z20822 Contact with and (suspected) exposure to covid-19: Secondary | ICD-10-CM | POA: Diagnosis not present

## 2022-03-30 DIAGNOSIS — R079 Chest pain, unspecified: Secondary | ICD-10-CM | POA: Diagnosis not present

## 2022-03-30 DIAGNOSIS — W19XXXA Unspecified fall, initial encounter: Secondary | ICD-10-CM | POA: Diagnosis not present

## 2022-03-30 DIAGNOSIS — R531 Weakness: Secondary | ICD-10-CM | POA: Diagnosis not present

## 2022-04-14 ENCOUNTER — Ambulatory Visit: Payer: Medicare (Managed Care) | Admitting: Podiatry

## 2022-04-19 ENCOUNTER — Ambulatory Visit: Payer: Medicare (Managed Care) | Admitting: Podiatry

## 2022-04-26 ENCOUNTER — Encounter: Payer: Self-pay | Admitting: Podiatry

## 2022-04-26 ENCOUNTER — Telehealth: Payer: Self-pay | Admitting: Podiatry

## 2022-04-26 NOTE — Telephone Encounter (Signed)
lvm to cb to r/s appt for 10-30 or 10-31 [place] oof  

## 2022-05-03 ENCOUNTER — Ambulatory Visit: Payer: Medicare (Managed Care) | Admitting: Podiatry

## 2022-05-07 ENCOUNTER — Ambulatory Visit: Payer: Medicare (Managed Care) | Admitting: Podiatry

## 2022-05-07 DIAGNOSIS — M79675 Pain in left toe(s): Secondary | ICD-10-CM | POA: Diagnosis not present

## 2022-05-07 DIAGNOSIS — I739 Peripheral vascular disease, unspecified: Secondary | ICD-10-CM | POA: Diagnosis not present

## 2022-05-07 DIAGNOSIS — M79674 Pain in right toe(s): Secondary | ICD-10-CM | POA: Diagnosis not present

## 2022-05-07 DIAGNOSIS — B351 Tinea unguium: Secondary | ICD-10-CM | POA: Diagnosis not present

## 2022-05-07 DIAGNOSIS — E1142 Type 2 diabetes mellitus with diabetic polyneuropathy: Secondary | ICD-10-CM | POA: Diagnosis not present

## 2022-05-11 ENCOUNTER — Encounter: Payer: Self-pay | Admitting: Podiatry

## 2022-05-11 NOTE — Progress Notes (Signed)
  Subjective:  Patient ID: Andrew Winters, male    DOB: Oct 21, 1940,  MRN: [id number]  Andrew Winters presents to clinic today for at risk foot care with h/o DM, PAD and neurpathy. Chief Complaint  Patient presents with   Nail Problem    Dfc BG - 110 yesterday A1C - pt does not recall PCP - Dr Maryella Shivers , last OV September 2023    New problem(s): None.   His wife is present during today's visit.  PCP is Maryella Shivers, MD.  Allergies  Allergen Reactions   Donepezil Diarrhea   Aspirin Diarrhea    Stomach pain    Review of Systems: Negative except as noted in the HPI.  Objective:  Andrew Winters is a pleasant 81 y.o. male WD, WN in NAD. AAO x 3.  Vascular Examination: CFT <4 seconds b/l. DP pulses faintly palpable b/l. PT pulses diminished b/l. Digital hair absent. Skin temperature gradient warm to warm b/l. No ischemia or gangrene. No cyanosis or clubbing noted b/l. Nonpitting edema noted BLE.   Neurological Examination: Pt has subjective symptoms of neuropathy. Sensation grossly intact b/l with 10 gram monofilament. Vibratory sensation intact b/l.   Dermatological Examination: Pedal skin warm and supple b/l. Toenails 1-5 b/l thick, discolored, elongated with subungual debris and pain on dorsal palpation.  No open wounds b/l LE. No interdigital macerations noted b/l LE.  Musculoskeletal Examination: Muscle strength 5/5 to b/l LE. Hammertoe deformity noted 2-5 b/l. Pes planus deformity noted bilateral LE. Utilizes cane for ambulation assistance.  Radiographs: None  Assessment/Plan: 1. Pain due to onychomycosis of toenails of both feet   2. PVD (peripheral vascular disease) ([place])   3. Diabetic polyneuropathy associated with type 2 diabetes mellitus ([place])     No orders of the defined types were placed in this encounter.   -Patient's family member present. All questions/concerns addressed on today's visit. -Consent given for treatment as described  below: -Examined patient. -Toenails 1-5 b/l were debrided in length and girth with sterile nail nippers and dremel without iatrogenic bleeding.  -Patient/POA to call should there be question/concern in the interim.   Return in about 3 months (around 08/07/2022).  Andrew Winters, DPM

## 2022-05-19 ENCOUNTER — Telehealth: Payer: Self-pay

## 2022-05-19 NOTE — Patient Outreach (Signed)
  Care Coordination   05/19/2022 Name: Andrew Winters MRN: [id number] DOB: 08-Nov-1940   Care Coordination Outreach Attempts:  An unsuccessful telephone outreach was attempted today to offer the patient information about available care coordination services as a benefit of their health plan.   Follow Up Plan:  Additional outreach attempts will be made to offer the patient care coordination information and services.   Encounter Outcome:  No Answer  Care Coordination Interventions Activated:  No   Care Coordination Interventions:  No, not indicated    Rowe Pavy, RN, BSN, CEN [facility] [employer] [phone]

## 2022-05-20 ENCOUNTER — Telehealth: Payer: Self-pay

## 2022-05-20 NOTE — Patient Outreach (Signed)
  Care Coordination   05/20/2022 Name: Andrew Winters MRN: [id number] DOB: 1941-05-24   Care Coordination Outreach Attempts:  A second unsuccessful outreach was attempted today to offer the patient with information about available care coordination services as a benefit of their health plan.     Follow Up Plan:  Additional outreach attempts will be made to offer the patient care coordination information and services.   Encounter Outcome:  No Answer  Care Coordination Interventions Activated:  No   Care Coordination Interventions:  No, not indicated    Rowe Pavy, RN, BSN, CEN [facility] [employer] [phone]

## 2022-06-09 DIAGNOSIS — Z862 Personal history of diseases of the blood and blood-forming organs and certain disorders involving the immune mechanism: Secondary | ICD-10-CM | POA: Diagnosis not present

## 2022-06-09 DIAGNOSIS — I10 Essential (primary) hypertension: Secondary | ICD-10-CM | POA: Diagnosis not present

## 2022-06-09 DIAGNOSIS — E1169 Type 2 diabetes mellitus with other specified complication: Secondary | ICD-10-CM | POA: Diagnosis not present

## 2022-06-13 ENCOUNTER — Telehealth: Payer: Self-pay

## 2022-06-13 NOTE — Patient Outreach (Signed)
  Care Coordination   06/13/2022 Name: Andrew Winters MRN: [id number] DOB: 31-Jul-1940   Care Coordination Outreach Attempts:  A third unsuccessful outreach was attempted today to offer the patient with information about available care coordination services as a benefit of their health plan.   Follow Up Plan:  No further outreach attempts will be made at this time. We have been unable to contact the patient to offer or enroll patient in care coordination services  Encounter Outcome:  No Answer   Care Coordination Interventions:  No, not indicated   Rowe Pavy, RN, BSN, CEN [facility] Coordinator [phone]

## 2022-07-06 DIAGNOSIS — Z1331 Encounter for screening for depression: Secondary | ICD-10-CM | POA: Diagnosis not present

## 2022-07-06 DIAGNOSIS — Z23 Encounter for immunization: Secondary | ICD-10-CM | POA: Diagnosis not present

## 2022-07-06 DIAGNOSIS — E785 Hyperlipidemia, unspecified: Secondary | ICD-10-CM | POA: Diagnosis not present

## 2022-07-06 DIAGNOSIS — E1169 Type 2 diabetes mellitus with other specified complication: Secondary | ICD-10-CM | POA: Diagnosis not present

## 2022-07-06 DIAGNOSIS — Z9181 History of falling: Secondary | ICD-10-CM | POA: Diagnosis not present

## 2022-07-06 DIAGNOSIS — I10 Essential (primary) hypertension: Secondary | ICD-10-CM | POA: Diagnosis not present

## 2022-08-29 ENCOUNTER — Ambulatory Visit (INDEPENDENT_AMBULATORY_CARE_PROVIDER_SITE_OTHER): Payer: Medicare (Managed Care) | Admitting: Podiatry

## 2022-08-29 ENCOUNTER — Encounter: Payer: Self-pay | Admitting: Podiatry

## 2022-08-29 VITALS — BP 144/86

## 2022-08-29 DIAGNOSIS — M2142 Flat foot [pes planus] (acquired), left foot: Secondary | ICD-10-CM | POA: Diagnosis not present

## 2022-08-29 DIAGNOSIS — M2141 Flat foot [pes planus] (acquired), right foot: Secondary | ICD-10-CM | POA: Diagnosis not present

## 2022-08-29 DIAGNOSIS — E119 Type 2 diabetes mellitus without complications: Secondary | ICD-10-CM

## 2022-08-29 DIAGNOSIS — M2042 Other hammer toe(s) (acquired), left foot: Secondary | ICD-10-CM | POA: Diagnosis not present

## 2022-08-29 DIAGNOSIS — I739 Peripheral vascular disease, unspecified: Secondary | ICD-10-CM | POA: Diagnosis not present

## 2022-08-29 DIAGNOSIS — B351 Tinea unguium: Secondary | ICD-10-CM

## 2022-08-29 DIAGNOSIS — E1142 Type 2 diabetes mellitus with diabetic polyneuropathy: Secondary | ICD-10-CM

## 2022-08-29 DIAGNOSIS — M2041 Other hammer toe(s) (acquired), right foot: Secondary | ICD-10-CM

## 2022-08-29 DIAGNOSIS — M79675 Pain in left toe(s): Secondary | ICD-10-CM | POA: Diagnosis not present

## 2022-08-29 DIAGNOSIS — M79674 Pain in right toe(s): Secondary | ICD-10-CM | POA: Diagnosis not present

## 2022-08-29 NOTE — Progress Notes (Signed)
ANNUAL DIABETIC FOOT EXAM  Subjective: Andrew Winters presents today for annual diabetic foot examination.  Chief Complaint  Patient presents with   Nail Problem    DFC BS-did not check today A1C-6.2 PCP-[facility] PCP VST-06/2022   Patient confirms h/o diabetes.  Patient relates 20+year h/o diabetes.  Patient denies any h/o foot wounds.  Patient does complain of feet being cold.  Patient denies any numbness, tingling, burning, or pins/needle sensation in feet.  Patient has been diagnosed with neuropathy and it is managed with gabapentin.  Risk factors: diabetes, diabetic neuropathy, foot deformity, h/o DVT, h/o PE, HTN, hyperlipidemia, h/o tobacco use in remission.  Maryella Shivers, MD is patient's PCP.   Past Medical History:  Diagnosis Date   Acute saddle pulmonary embolism ([place]) 05/20/2016   Anxiety    Bilateral swelling of feet    BPH (benign prostatic hyperplasia)    no meds needed per medical md   Cataracts, bilateral    immature   Chronic diarrhea    Deafness    in left ear    Dementia ([place])    Depression    takes Zoloft daily   Diabetes mellitus    takes Actos and Amaryl daily as well as Onglyza   Dysphagia    Fatigue    GERD (gastroesophageal reflux disease)    takes Omeprazole daily   Headaches, cluster    Hiatal hernia    Hyperlipemia    was on meds but hasnt been on anything in about 45month per MD   Hypertension    takes Amlodipine,HCTZ,and Lisinopril daily   Hyperthyroidism    Insomnia    takes Trazodone nightly   Nocturia    Osteoarthritis    Peripheral neuropathy    takes Gabapentin daily   Pneumonia 3+ yrs ago   Presbyesophagus    Rheumatoid arthritis(714.0)    Sleep apnea    Vitamin D deficiency    takes Vit D daily   Patient Active Problem List   Diagnosis Date Noted   Achalasia    Chronic anticoagulation    Gastritis and gastroduodenitis    Severe sepsis ([place] 09/26/2017   UTI (urinary tract infection) 09/26/2017    Abnormal loss of weight    History of snoring 09/20/2016   Chronic diarrhea 09/20/2016   Urinary tract infection without hematuria    Vascular dementia without behavioral disturbance (HCC)    Hypokalemia    Dysphagia    Chest pain 09/11/2016   Hammer toes, bilateral 08/18/2016   Pulmonary emboli ([place] 05/20/2016   Acute saddle pulmonary embolism without acute cor pulmonale (HCC) 05/20/2016   Onychomycosis due to dermatophyte 12/29/2015   Pseudogout of right knee 01/13/2015   DVT of upper extremity (deep vein thrombosis): LEFT 01/11/2015   Anemia, iron deficiency 01/11/2015   Postoperative fever    Fever 01/08/2015   Diabetes mellitus type 2, controlled ([place] 01/07/2015   Essential hypertension 01/07/2015   SIRS (systemic inflammatory response syndrome) ([facility] 01/07/2015   Acute encephalopathy    Primary osteoarthritis of right knee 01/06/2015   Diabetes ([place] 01/06/2015   Morbid obesity ([place] 01/06/2015   Diabetic neuropathy associated with type 2 diabetes mellitus ([facility] 01/05/2010   HEADACHE 05/07/2009   UNSPECIFIED VITAMIN D DEFICIENCY 02/03/2009   LIVER FUNCTION TESTS, ABNORMAL 07/19/2007   HYPERTHYROIDISM 01/04/2007   DIABETES MELLITUS, TYPE II 01/04/2007   HYPERLIPIDEMIA 01/04/2007   ERECTILE DYSFUNCTION 01/04/2007   DEPRESSION 01/04/2007   GERD 01/04/2007   BENIGN PROSTATIC HYPERTROPHY 01/04/2007   OSTEOARTHRITIS 01/04/2007  Past Surgical History:  Procedure Laterality Date   BACK SURGERY  1998   BIOPSY  12/03/2020   Procedure: BIOPSY;  Surgeon: Lavena Bullion, DO;  Location: [facility];  Service: Gastroenterology;;   BOTOX INJECTION N/A 11/11/2016   Procedure: BOTOX INJECTION;  Surgeon: Doran Stabler, MD;  Location: [facility];  Service: Gastroenterology;  Laterality: N/A;   BOTOX INJECTION N/A 09/07/2018   Procedure: BOTOX INJECTION;  Surgeon: Doran Stabler, MD;  Location: [facility];  Service: Gastroenterology;  Laterality: N/A;   BOTOX INJECTION  N/A 10/18/2019   Procedure: BOTOX INJECTION;  Surgeon: Doran Stabler, MD;  Location: [facility];  Service: Gastroenterology;  Laterality: N/A;   BOTOX INJECTION N/A 12/03/2020   Procedure: BOTOX INJECTION;  Surgeon: Lavena Bullion, DO;  Location: [facility];  Service: Gastroenterology;  Laterality: N/A;   CATARACT EXTRACTION, BILATERAL  2018   COLONOSCOPY WITH ESOPHAGOGASTRODUODENOSCOPY (EGD) AND ESOPHAGEAL DILATION (ED)     ESOPHAGEAL MANOMETRY N/A 09/14/2016   Procedure: ESOPHAGEAL MANOMETRY (EM);  Surgeon: Manus Gunning, MD;  [facility]: [facility];  Service: Gastroenterology;  Laterality: N/A;   ESOPHAGOGASTRODUODENOSCOPY N/A 11/11/2016   Procedure: ESOPHAGOGASTRODUODENOSCOPY (EGD);  Surgeon: Doran Stabler, MD;  [facility];  Service: Gastroenterology;  Laterality: N/A;   ESOPHAGOGASTRODUODENOSCOPY (EGD) WITH PROPOFOL N/A 09/15/2016   Procedure: ESOPHAGOGASTRODUODENOSCOPY (EGD) WITH PROPOFOL;  Surgeon: Manus Gunning, MD;  [facility];  Service: Gastroenterology;  Laterality: N/A;   ESOPHAGOGASTRODUODENOSCOPY (EGD) WITH PROPOFOL N/A 09/07/2018   Procedure: ESOPHAGOGASTRODUODENOSCOPY (EGD) WITH PROPOFOL;  Surgeon: Doran Stabler, MD;  [facility];  Service: Gastroenterology;  Laterality: N/A;   ESOPHAGOGASTRODUODENOSCOPY (EGD) WITH PROPOFOL N/A 10/18/2019   Procedure: ESOPHAGOGASTRODUODENOSCOPY (EGD) WITH PROPOFOL;  Surgeon: Doran Stabler, MD;  [facility];  Service: Gastroenterology;  Laterality: N/A;   ESOPHAGOGASTRODUODENOSCOPY (EGD) WITH PROPOFOL N/A 12/03/2020   Procedure: ESOPHAGOGASTRODUODENOSCOPY (EGD) WITH PROPOFOL;  Surgeon: Lavena Bullion, DO;  [facility];  Service: Gastroenterology;  Laterality: N/A;   IRRIGATION AND DEBRIDEMENT KNEE Right 01/12/2015   TOTAL KNEE ARTHROPLASTY Right 01/06/2015   Procedure: TOTAL KNEE ARTHROPLASTY;  Surgeon: Melrose Nakayama, MD;  Location: [place];  Service:  Orthopedics;  Laterality: Right;   Current Outpatient Medications on File Prior to Visit  Medication Sig Dispense Refill   XARELTO 10 MG TABS tablet Take 10 mg by mouth daily.     ACCU-CHEK AVIVA PLUS test strip      acetaminophen (TYLENOL) 500 MG tablet Take 1,000 mg by mouth 2 (two) times daily.     Alcohol Swabs (DROPSAFE ALCOHOL PREP) 70 % PADS      atorvastatin (LIPITOR) 40 MG tablet Take 40 mg by mouth at bedtime.      bismuth subsalicylate (PEPTO BISMOL) 262 MG/15ML suspension Take 30 mLs by mouth every 6 (six) hours as needed for indigestion or diarrhea or loose stools.     cetirizine (ZYRTEC) 10 MG tablet Take 10 mg by mouth daily.     cholecalciferol (VITAMIN D) 1000 units tablet Take 1,000 Units by mouth daily.      dextromethorphan-guaiFENesin (MUCINEX DM) 30-600 MG 12hr tablet Take 1 tablet by mouth 2 (two) times daily.     diltiazem (CARDIZEM) 60 MG tablet Take 60 mg by mouth in the morning and at bedtime.     ferrous sulfate 325 (65 FE) MG tablet Take 325 mg by mouth daily with breakfast.     furosemide (LASIX) 20 MG tablet Take  1 tablet (20 mg total) by mouth daily.     gabapentin (NEURONTIN) 100 MG capsule Take 1-2 capsules (100-200 mg total) by mouth See admin instructions. 160m in the morning and at noon and 2049mat bedtime (Patient taking differently: Take 100-200 mg by mouth See admin instructions. 10084mn the morning, 100 mg in the evening, and 200m91m bedtime)     glimepiride (AMARYL) 4 MG tablet Take 4 mg by mouth 2 (two) times daily.     lisinopril (PRINIVIL,ZESTRIL) 20 MG tablet Take 5 mg by mouth daily.     loperamide (IMODIUM) 1 MG/5ML solution Take 3 mg by mouth as needed for diarrhea or loose stools.     LORazepam (ATIVAN) 1 MG tablet      memantine (NAMENDA) 10 MG tablet Take 10 mg by mouth 2 (two) times daily.     Multiple Vitamin (MULTIVITAMIN WITH MINERALS) TABS tablet Take 1 tablet by mouth daily.     omeprazole (PRILOSEC) 20 MG capsule Take 20 mg by  mouth daily.     potassium chloride (KLOR-CON M) 10 MEQ tablet      potassium chloride (MICRO-K) 10 MEQ CR capsule Take 10 mEq by mouth daily.     propranolol (INDERAL) 10 MG tablet Take 10 mg by mouth 2 (two) times daily.     sertraline (ZOLOFT) 100 MG tablet      traMADol (ULTRAM) 50 MG tablet Take 50 mg by mouth every 8 (eight) hours as needed for moderate pain.     traZODone (DESYREL) 50 MG tablet Take 50 mg by mouth at bedtime.     VICTOZA 18 MG/3ML SOPN Inject 0.6 mg into the skin daily at 12 noon.      vitamin E 400 UNIT capsule Take 400 Units by mouth daily.     No current facility-administered medications on file prior to visit.    Allergies  Allergen Reactions   Donepezil Diarrhea   Aspirin Diarrhea    Stomach pain   Social History   Occupational History   Occupation: Retired   Tobacco Use   Smoking status: Former    Packs/day: 1.00    Years: 35.00    Total pack years: 35.00    Types: Cigarettes    Quit date: 10/15/2014    Years since quitting: 7.8   Smokeless tobacco: Never   Tobacco comments:    pack last about 2-3wks  Vaping Use   Vaping Use: Never used  Substance and Sexual Activity   Alcohol use: No   Drug use: No   Sexual activity: Not Currently   Family History  Problem Relation Age of Onset   Heart disease Mother    Diabetes Mother    Heart attack Father    Diabetes Brother    Colon cancer Neg Hx    Stomach cancer Neg Hx    Esophageal cancer Neg Hx    Rectal cancer Neg Hx    Liver cancer Neg Hx    Immunization History  Administered Date(s) Administered   Influenza Whole 07/11/2001, 05/04/2007, 05/08/2008   Influenza-Unspecified 06/28/2017   PPD Test 09/19/2016   Pneumococcal Polysaccharide-23 10/19/2001, 05/21/2016   Td 09/09/1998, 02/03/2009     Review of Systems: Negative except as noted in the HPI.   Objective: Vitals:   08/29/22 1638  BP: (!) 144/86   HarrAce Wormana pleasant 81 y43. male in NAD. AAO X 3.  Vascular  Examination: CFT <3 seconds b/l. DP pulses faintly palpable b/l. PT  pulses nonpalpable b/l. Digital hair absent. Skin temperature gradient warm to warm b/l. No pain with calf compression. No ischemia or gangrene. No cyanosis or clubbing noted b/l. +1 pitting edema noted BLE.   Neurological Examination: Pt has subjective symptoms of neuropathy. Protective sensation diminished with 10g monofilament b/l. Vibratory sensation intact b/l.  Dermatological Examination: Pedal skin warm and supple b/l. No open wounds b/l. No interdigital macerations. Toenails 1-5 b/l thick, discolored, elongated with subungual debris and pain on dorsal palpation.  No hyperkeratotic nor porokeratotic lesions present on today's visit.  Musculoskeletal Examination: Muscle strength 5/5 to b/l LE. No pain, crepitus or joint limitation noted with ROM bilateral LE. Hammertoe(s) noted to the 2-5 bilaterally. Pes planus deformity noted bilateral LE. Utilizes cane for ambulation assistance.  Radiographs: None  Footwear Assessment: Does the patient wear appropriate shoes? Yes. Does the patient need inserts/orthotics? Yes.  Lab Results  Component Value Date   HGBA1C 8.1 (H) 09/26/2017   ADA Risk Categorization: High Risk  Patient has one or more of the following: Loss of protective sensation Absent pedal pulses Severe Foot deformity History of foot ulcer  Assessment: 1. Pain due to onychomycosis of toenails of both feet   2. Hammer toes of both feet   3. Pes planus of both feet   4. PVD (peripheral vascular disease) ([place])   5. Diabetic polyneuropathy associated with type 2 diabetes mellitus ([place])   6. Encounter for diabetic foot exam ([place])     Plan: Orders Placed This Encounter  Procedures   For Home Use Only DME Diabetic Shoe    Dispense one pair extra depth shoes with 3 pair total contact insoles.   FOR HOME USE ONLY DME DIABETIC SHOE  -Patient's family member present. All questions/concerns addressed on  today's visit. -Diabetic foot examination performed today. -Continue diabetic foot care principles: inspect feet daily, monitor glucose as recommended by PCP and/or Endocrinologist, and follow prescribed diet per PCP, Endocrinologist and/or dietician. -Continue diabetic shoes daily. -Discussed diabetic shoe benefit available based on patient's diagnoses. Patient/POA would like to proceed. Order entered for one pair extra depth shoes and 3 pair total contact insoles. Patient qualifies based on diagnoses. -Toenails 1-5 b/l were debrided in length and girth with sterile nail nippers and dremel without iatrogenic bleeding.  -Patient/POA to call should there be question/concern in the interim. Return in about 3 months (around 11/27/2022).  Marzetta Board, DPM

## 2022-09-07 ENCOUNTER — Ambulatory Visit: Payer: Medicare (Managed Care)

## 2022-09-09 ENCOUNTER — Ambulatory Visit: Payer: Medicare (Managed Care)

## 2022-09-26 ENCOUNTER — Ambulatory Visit (INDEPENDENT_AMBULATORY_CARE_PROVIDER_SITE_OTHER): Payer: Medicare (Managed Care)

## 2022-09-26 DIAGNOSIS — M2041 Other hammer toe(s) (acquired), right foot: Secondary | ICD-10-CM

## 2022-09-26 DIAGNOSIS — E1142 Type 2 diabetes mellitus with diabetic polyneuropathy: Secondary | ICD-10-CM

## 2022-09-26 DIAGNOSIS — M2141 Flat foot [pes planus] (acquired), right foot: Secondary | ICD-10-CM

## 2022-09-26 DIAGNOSIS — M2042 Other hammer toe(s) (acquired), left foot: Secondary | ICD-10-CM

## 2022-09-26 DIAGNOSIS — M2142 Flat foot [pes planus] (acquired), left foot: Secondary | ICD-10-CM

## 2022-09-26 NOTE — Progress Notes (Signed)
Patient presents to the office today for diabetic shoe and insole measuring.  Patient was measured with brannock device to determine size and width for 1 pair of extra depth shoes and foam casted for 3 pair of insoles.   ABN signed.   Documentation of medical necessity will be sent to patient's treating diabetic doctor to verify and sign.   Patient's diabetic provider: Maryella Shivers, MD   Shoes and insoles will be ordered at that time and patient will be notified for an appointment for fitting when they arrive.    Patient shoe selection-   1st   Shoe choice:   [id number]  Shoe size ordered: 12W

## 2022-10-21 ENCOUNTER — Telehealth: Payer: Self-pay | Admitting: Podiatry

## 2022-10-21 NOTE — Telephone Encounter (Signed)
Lmom for patient to call back and schedule picking up diabetic shoes    Expires  01/05/23

## 2022-10-24 NOTE — Telephone Encounter (Signed)
Lmom to call back to schedule picking up diabetic shoes  

## 2022-10-25 DIAGNOSIS — E1169 Type 2 diabetes mellitus with other specified complication: Secondary | ICD-10-CM | POA: Diagnosis not present

## 2022-10-25 DIAGNOSIS — I10 Essential (primary) hypertension: Secondary | ICD-10-CM | POA: Diagnosis not present

## 2022-11-08 DIAGNOSIS — R32 Unspecified urinary incontinence: Secondary | ICD-10-CM | POA: Diagnosis not present

## 2022-11-08 DIAGNOSIS — N390 Urinary tract infection, site not specified: Secondary | ICD-10-CM | POA: Diagnosis not present

## 2022-11-08 DIAGNOSIS — Z6835 Body mass index (BMI) 35.0-35.9, adult: Secondary | ICD-10-CM | POA: Diagnosis not present

## 2022-11-08 DIAGNOSIS — E1169 Type 2 diabetes mellitus with other specified complication: Secondary | ICD-10-CM | POA: Diagnosis not present

## 2022-11-08 DIAGNOSIS — E785 Hyperlipidemia, unspecified: Secondary | ICD-10-CM | POA: Diagnosis not present

## 2022-11-08 DIAGNOSIS — I10 Essential (primary) hypertension: Secondary | ICD-10-CM | POA: Diagnosis not present

## 2022-11-09 ENCOUNTER — Other Ambulatory Visit: Payer: Medicare (Managed Care)

## 2022-11-15 ENCOUNTER — Other Ambulatory Visit: Payer: Medicare (Managed Care)

## 2022-11-25 DIAGNOSIS — Z743 Need for continuous supervision: Secondary | ICD-10-CM | POA: Diagnosis not present

## 2022-11-25 DIAGNOSIS — Z043 Encounter for examination and observation following other accident: Secondary | ICD-10-CM | POA: Diagnosis not present

## 2022-11-25 DIAGNOSIS — K219 Gastro-esophageal reflux disease without esophagitis: Secondary | ICD-10-CM | POA: Diagnosis not present

## 2022-11-25 DIAGNOSIS — E11649 Type 2 diabetes mellitus with hypoglycemia without coma: Secondary | ICD-10-CM | POA: Diagnosis not present

## 2022-11-25 DIAGNOSIS — R109 Unspecified abdominal pain: Secondary | ICD-10-CM | POA: Diagnosis not present

## 2022-11-25 DIAGNOSIS — R279 Unspecified lack of coordination: Secondary | ICD-10-CM | POA: Diagnosis not present

## 2022-11-25 DIAGNOSIS — F0390 Unspecified dementia without behavioral disturbance: Secondary | ICD-10-CM | POA: Diagnosis not present

## 2022-11-25 DIAGNOSIS — E161 Other hypoglycemia: Secondary | ICD-10-CM | POA: Diagnosis not present

## 2022-11-25 DIAGNOSIS — I639 Cerebral infarction, unspecified: Secondary | ICD-10-CM | POA: Diagnosis not present

## 2022-11-25 DIAGNOSIS — E162 Hypoglycemia, unspecified: Secondary | ICD-10-CM | POA: Diagnosis not present

## 2022-11-25 DIAGNOSIS — W19XXXA Unspecified fall, initial encounter: Secondary | ICD-10-CM | POA: Diagnosis not present

## 2022-11-25 DIAGNOSIS — I10 Essential (primary) hypertension: Secondary | ICD-10-CM | POA: Diagnosis not present

## 2022-11-25 DIAGNOSIS — R5381 Other malaise: Secondary | ICD-10-CM | POA: Diagnosis not present

## 2022-12-02 DIAGNOSIS — K219 Gastro-esophageal reflux disease without esophagitis: Secondary | ICD-10-CM | POA: Diagnosis not present

## 2022-12-02 DIAGNOSIS — Z96651 Presence of right artificial knee joint: Secondary | ICD-10-CM | POA: Diagnosis not present

## 2022-12-02 DIAGNOSIS — M109 Gout, unspecified: Secondary | ICD-10-CM | POA: Diagnosis not present

## 2022-12-02 DIAGNOSIS — E11649 Type 2 diabetes mellitus with hypoglycemia without coma: Secondary | ICD-10-CM | POA: Diagnosis not present

## 2022-12-02 DIAGNOSIS — Z556 Problems related to health literacy: Secondary | ICD-10-CM | POA: Diagnosis not present

## 2022-12-02 DIAGNOSIS — Z7901 Long term (current) use of anticoagulants: Secondary | ICD-10-CM | POA: Diagnosis not present

## 2022-12-02 DIAGNOSIS — M549 Dorsalgia, unspecified: Secondary | ICD-10-CM | POA: Diagnosis not present

## 2022-12-02 DIAGNOSIS — Z604 Social exclusion and rejection: Secondary | ICD-10-CM | POA: Diagnosis not present

## 2022-12-02 DIAGNOSIS — N39498 Other specified urinary incontinence: Secondary | ICD-10-CM | POA: Diagnosis not present

## 2022-12-02 DIAGNOSIS — M159 Polyosteoarthritis, unspecified: Secondary | ICD-10-CM | POA: Diagnosis not present

## 2022-12-02 DIAGNOSIS — Z86711 Personal history of pulmonary embolism: Secondary | ICD-10-CM | POA: Diagnosis not present

## 2022-12-02 DIAGNOSIS — N401 Enlarged prostate with lower urinary tract symptoms: Secondary | ICD-10-CM | POA: Diagnosis not present

## 2022-12-02 DIAGNOSIS — Z9181 History of falling: Secondary | ICD-10-CM | POA: Diagnosis not present

## 2022-12-06 DIAGNOSIS — I499 Cardiac arrhythmia, unspecified: Secondary | ICD-10-CM | POA: Diagnosis not present

## 2022-12-06 DIAGNOSIS — Z743 Need for continuous supervision: Secondary | ICD-10-CM | POA: Diagnosis not present

## 2022-12-06 DIAGNOSIS — R627 Adult failure to thrive: Secondary | ICD-10-CM | POA: Diagnosis not present

## 2022-12-06 DIAGNOSIS — E119 Type 2 diabetes mellitus without complications: Secondary | ICD-10-CM | POA: Diagnosis not present

## 2022-12-06 DIAGNOSIS — N400 Enlarged prostate without lower urinary tract symptoms: Secondary | ICD-10-CM | POA: Diagnosis not present

## 2022-12-06 DIAGNOSIS — R509 Fever, unspecified: Secondary | ICD-10-CM | POA: Diagnosis not present

## 2022-12-06 DIAGNOSIS — I4430 Unspecified atrioventricular block: Secondary | ICD-10-CM | POA: Diagnosis not present

## 2022-12-06 DIAGNOSIS — I10 Essential (primary) hypertension: Secondary | ICD-10-CM | POA: Diagnosis not present

## 2022-12-06 DIAGNOSIS — R262 Difficulty in walking, not elsewhere classified: Secondary | ICD-10-CM | POA: Diagnosis not present

## 2022-12-06 DIAGNOSIS — Z8701 Personal history of pneumonia (recurrent): Secondary | ICD-10-CM | POA: Diagnosis not present

## 2022-12-06 DIAGNOSIS — L89151 Pressure ulcer of sacral region, stage 1: Secondary | ICD-10-CM | POA: Diagnosis not present

## 2022-12-06 DIAGNOSIS — R4182 Altered mental status, unspecified: Secondary | ICD-10-CM | POA: Diagnosis not present

## 2022-12-06 DIAGNOSIS — R6889 Other general symptoms and signs: Secondary | ICD-10-CM | POA: Diagnosis not present

## 2022-12-06 DIAGNOSIS — F0390 Unspecified dementia without behavioral disturbance: Secondary | ICD-10-CM | POA: Diagnosis not present

## 2022-12-06 DIAGNOSIS — Z86711 Personal history of pulmonary embolism: Secondary | ICD-10-CM | POA: Diagnosis not present

## 2022-12-06 DIAGNOSIS — R109 Unspecified abdominal pain: Secondary | ICD-10-CM | POA: Diagnosis not present

## 2022-12-06 DIAGNOSIS — R404 Transient alteration of awareness: Secondary | ICD-10-CM | POA: Diagnosis not present

## 2022-12-07 ENCOUNTER — Other Ambulatory Visit: Payer: Medicare (Managed Care)

## 2022-12-07 DIAGNOSIS — I444 Left anterior fascicular block: Secondary | ICD-10-CM | POA: Diagnosis not present

## 2022-12-07 DIAGNOSIS — R9431 Abnormal electrocardiogram [ECG] [EKG]: Secondary | ICD-10-CM | POA: Diagnosis not present

## 2022-12-07 DIAGNOSIS — I493 Ventricular premature depolarization: Secondary | ICD-10-CM | POA: Diagnosis not present

## 2022-12-08 DIAGNOSIS — I444 Left anterior fascicular block: Secondary | ICD-10-CM | POA: Diagnosis not present

## 2022-12-08 DIAGNOSIS — R9431 Abnormal electrocardiogram [ECG] [EKG]: Secondary | ICD-10-CM | POA: Diagnosis not present

## 2022-12-08 DIAGNOSIS — I493 Ventricular premature depolarization: Secondary | ICD-10-CM | POA: Diagnosis not present

## 2022-12-09 DIAGNOSIS — I444 Left anterior fascicular block: Secondary | ICD-10-CM | POA: Diagnosis not present

## 2022-12-09 DIAGNOSIS — R109 Unspecified abdominal pain: Secondary | ICD-10-CM | POA: Diagnosis not present

## 2022-12-09 DIAGNOSIS — R9431 Abnormal electrocardiogram [ECG] [EKG]: Secondary | ICD-10-CM | POA: Diagnosis not present

## 2022-12-09 DIAGNOSIS — I493 Ventricular premature depolarization: Secondary | ICD-10-CM | POA: Diagnosis not present

## 2022-12-10 DIAGNOSIS — I444 Left anterior fascicular block: Secondary | ICD-10-CM | POA: Diagnosis not present

## 2022-12-10 DIAGNOSIS — R9431 Abnormal electrocardiogram [ECG] [EKG]: Secondary | ICD-10-CM | POA: Diagnosis not present

## 2022-12-10 DIAGNOSIS — I493 Ventricular premature depolarization: Secondary | ICD-10-CM | POA: Diagnosis not present

## 2022-12-11 DIAGNOSIS — E119 Type 2 diabetes mellitus without complications: Secondary | ICD-10-CM | POA: Diagnosis not present

## 2022-12-11 DIAGNOSIS — R509 Fever, unspecified: Secondary | ICD-10-CM | POA: Diagnosis not present

## 2022-12-11 DIAGNOSIS — R4182 Altered mental status, unspecified: Secondary | ICD-10-CM | POA: Diagnosis not present

## 2022-12-11 DIAGNOSIS — R0602 Shortness of breath: Secondary | ICD-10-CM | POA: Diagnosis not present

## 2022-12-11 DIAGNOSIS — I700 Atherosclerosis of aorta: Secondary | ICD-10-CM | POA: Diagnosis not present

## 2022-12-11 DIAGNOSIS — I444 Left anterior fascicular block: Secondary | ICD-10-CM | POA: Diagnosis not present

## 2022-12-11 DIAGNOSIS — I493 Ventricular premature depolarization: Secondary | ICD-10-CM | POA: Diagnosis not present

## 2022-12-11 DIAGNOSIS — R9431 Abnormal electrocardiogram [ECG] [EKG]: Secondary | ICD-10-CM | POA: Diagnosis not present

## 2022-12-11 DIAGNOSIS — F0390 Unspecified dementia without behavioral disturbance: Secondary | ICD-10-CM | POA: Diagnosis not present

## 2022-12-12 DIAGNOSIS — E559 Vitamin D deficiency, unspecified: Secondary | ICD-10-CM | POA: Diagnosis not present

## 2022-12-12 DIAGNOSIS — R627 Adult failure to thrive: Secondary | ICD-10-CM | POA: Diagnosis not present

## 2022-12-12 DIAGNOSIS — G629 Polyneuropathy, unspecified: Secondary | ICD-10-CM | POA: Diagnosis not present

## 2022-12-12 DIAGNOSIS — I493 Ventricular premature depolarization: Secondary | ICD-10-CM | POA: Diagnosis not present

## 2022-12-12 DIAGNOSIS — R279 Unspecified lack of coordination: Secondary | ICD-10-CM | POA: Diagnosis not present

## 2022-12-12 DIAGNOSIS — F419 Anxiety disorder, unspecified: Secondary | ICD-10-CM | POA: Diagnosis not present

## 2022-12-12 DIAGNOSIS — R093 Abnormal sputum: Secondary | ICD-10-CM | POA: Diagnosis not present

## 2022-12-12 DIAGNOSIS — F418 Other specified anxiety disorders: Secondary | ICD-10-CM | POA: Diagnosis not present

## 2022-12-12 DIAGNOSIS — E785 Hyperlipidemia, unspecified: Secondary | ICD-10-CM | POA: Diagnosis not present

## 2022-12-12 DIAGNOSIS — M6281 Muscle weakness (generalized): Secondary | ICD-10-CM | POA: Diagnosis not present

## 2022-12-12 DIAGNOSIS — R7401 Elevation of levels of liver transaminase levels: Secondary | ICD-10-CM | POA: Diagnosis not present

## 2022-12-12 DIAGNOSIS — E876 Hypokalemia: Secondary | ICD-10-CM | POA: Diagnosis not present

## 2022-12-12 DIAGNOSIS — R4182 Altered mental status, unspecified: Secondary | ICD-10-CM | POA: Diagnosis not present

## 2022-12-12 DIAGNOSIS — Z7901 Long term (current) use of anticoagulants: Secondary | ICD-10-CM | POA: Diagnosis not present

## 2022-12-12 DIAGNOSIS — K219 Gastro-esophageal reflux disease without esophagitis: Secondary | ICD-10-CM | POA: Diagnosis not present

## 2022-12-12 DIAGNOSIS — Z7189 Other specified counseling: Secondary | ICD-10-CM | POA: Diagnosis not present

## 2022-12-12 DIAGNOSIS — R262 Difficulty in walking, not elsewhere classified: Secondary | ICD-10-CM | POA: Diagnosis not present

## 2022-12-12 DIAGNOSIS — R059 Cough, unspecified: Secondary | ICD-10-CM | POA: Diagnosis not present

## 2022-12-12 DIAGNOSIS — L89323 Pressure ulcer of left buttock, stage 3: Secondary | ICD-10-CM | POA: Diagnosis not present

## 2022-12-12 DIAGNOSIS — E8809 Other disorders of plasma-protein metabolism, not elsewhere classified: Secondary | ICD-10-CM | POA: Diagnosis not present

## 2022-12-12 DIAGNOSIS — R112 Nausea with vomiting, unspecified: Secondary | ICD-10-CM | POA: Diagnosis not present

## 2022-12-12 DIAGNOSIS — K5900 Constipation, unspecified: Secondary | ICD-10-CM | POA: Diagnosis not present

## 2022-12-12 DIAGNOSIS — F32A Depression, unspecified: Secondary | ICD-10-CM | POA: Diagnosis not present

## 2022-12-12 DIAGNOSIS — B351 Tinea unguium: Secondary | ICD-10-CM | POA: Diagnosis not present

## 2022-12-12 DIAGNOSIS — R82998 Other abnormal findings in urine: Secondary | ICD-10-CM | POA: Diagnosis not present

## 2022-12-12 DIAGNOSIS — R400 Somnolence: Secondary | ICD-10-CM | POA: Diagnosis not present

## 2022-12-12 DIAGNOSIS — Z7409 Other reduced mobility: Secondary | ICD-10-CM | POA: Diagnosis not present

## 2022-12-12 DIAGNOSIS — I10 Essential (primary) hypertension: Secondary | ICD-10-CM | POA: Diagnosis not present

## 2022-12-12 DIAGNOSIS — R1311 Dysphagia, oral phase: Secondary | ICD-10-CM | POA: Diagnosis not present

## 2022-12-12 DIAGNOSIS — M2042 Other hammer toe(s) (acquired), left foot: Secondary | ICD-10-CM | POA: Diagnosis not present

## 2022-12-12 DIAGNOSIS — R9431 Abnormal electrocardiogram [ECG] [EKG]: Secondary | ICD-10-CM | POA: Diagnosis not present

## 2022-12-12 DIAGNOSIS — R509 Fever, unspecified: Secondary | ICD-10-CM | POA: Diagnosis not present

## 2022-12-12 DIAGNOSIS — K760 Fatty (change of) liver, not elsewhere classified: Secondary | ICD-10-CM | POA: Diagnosis not present

## 2022-12-12 DIAGNOSIS — R5383 Other fatigue: Secondary | ICD-10-CM | POA: Diagnosis not present

## 2022-12-12 DIAGNOSIS — L89151 Pressure ulcer of sacral region, stage 1: Secondary | ICD-10-CM | POA: Diagnosis not present

## 2022-12-12 DIAGNOSIS — Z5181 Encounter for therapeutic drug level monitoring: Secondary | ICD-10-CM | POA: Diagnosis not present

## 2022-12-12 DIAGNOSIS — R0981 Nasal congestion: Secondary | ICD-10-CM | POA: Diagnosis not present

## 2022-12-12 DIAGNOSIS — M6289 Other specified disorders of muscle: Secondary | ICD-10-CM | POA: Diagnosis not present

## 2022-12-12 DIAGNOSIS — N390 Urinary tract infection, site not specified: Secondary | ICD-10-CM | POA: Diagnosis not present

## 2022-12-12 DIAGNOSIS — R5381 Other malaise: Secondary | ICD-10-CM | POA: Diagnosis not present

## 2022-12-12 DIAGNOSIS — R41841 Cognitive communication deficit: Secondary | ICD-10-CM | POA: Diagnosis not present

## 2022-12-12 DIAGNOSIS — G4710 Hypersomnia, unspecified: Secondary | ICD-10-CM | POA: Diagnosis not present

## 2022-12-12 DIAGNOSIS — Z7401 Bed confinement status: Secondary | ICD-10-CM | POA: Diagnosis not present

## 2022-12-12 DIAGNOSIS — F03918 Unspecified dementia, unspecified severity, with other behavioral disturbance: Secondary | ICD-10-CM | POA: Diagnosis not present

## 2022-12-12 DIAGNOSIS — F0390 Unspecified dementia without behavioral disturbance: Secondary | ICD-10-CM | POA: Diagnosis not present

## 2022-12-12 DIAGNOSIS — Z741 Need for assistance with personal care: Secondary | ICD-10-CM | POA: Diagnosis not present

## 2022-12-12 DIAGNOSIS — E119 Type 2 diabetes mellitus without complications: Secondary | ICD-10-CM | POA: Diagnosis not present

## 2022-12-12 DIAGNOSIS — E1159 Type 2 diabetes mellitus with other circulatory complications: Secondary | ICD-10-CM | POA: Diagnosis not present

## 2022-12-12 DIAGNOSIS — I444 Left anterior fascicular block: Secondary | ICD-10-CM | POA: Diagnosis not present

## 2022-12-12 DIAGNOSIS — M2041 Other hammer toe(s) (acquired), right foot: Secondary | ICD-10-CM | POA: Diagnosis not present

## 2022-12-13 ENCOUNTER — Ambulatory Visit: Payer: Medicare (Managed Care) | Admitting: Podiatry

## 2022-12-13 DIAGNOSIS — R509 Fever, unspecified: Secondary | ICD-10-CM | POA: Diagnosis not present

## 2022-12-13 DIAGNOSIS — G629 Polyneuropathy, unspecified: Secondary | ICD-10-CM | POA: Diagnosis not present

## 2022-12-13 DIAGNOSIS — E785 Hyperlipidemia, unspecified: Secondary | ICD-10-CM | POA: Diagnosis not present

## 2022-12-13 DIAGNOSIS — E559 Vitamin D deficiency, unspecified: Secondary | ICD-10-CM | POA: Diagnosis not present

## 2022-12-15 DIAGNOSIS — E785 Hyperlipidemia, unspecified: Secondary | ICD-10-CM | POA: Diagnosis not present

## 2022-12-15 DIAGNOSIS — Z5181 Encounter for therapeutic drug level monitoring: Secondary | ICD-10-CM | POA: Diagnosis not present

## 2022-12-15 DIAGNOSIS — E119 Type 2 diabetes mellitus without complications: Secondary | ICD-10-CM | POA: Diagnosis not present

## 2022-12-15 DIAGNOSIS — R7401 Elevation of levels of liver transaminase levels: Secondary | ICD-10-CM | POA: Diagnosis not present

## 2022-12-19 DIAGNOSIS — R627 Adult failure to thrive: Secondary | ICD-10-CM | POA: Diagnosis not present

## 2022-12-19 DIAGNOSIS — R262 Difficulty in walking, not elsewhere classified: Secondary | ICD-10-CM | POA: Diagnosis not present

## 2022-12-19 DIAGNOSIS — L89151 Pressure ulcer of sacral region, stage 1: Secondary | ICD-10-CM | POA: Diagnosis not present

## 2022-12-19 DIAGNOSIS — F0390 Unspecified dementia without behavioral disturbance: Secondary | ICD-10-CM | POA: Diagnosis not present

## 2022-12-19 NOTE — Telephone Encounter (Signed)
Lmom for pt to call back to schedule picking up diabetic shoes

## 2022-12-20 DIAGNOSIS — R7401 Elevation of levels of liver transaminase levels: Secondary | ICD-10-CM | POA: Diagnosis not present

## 2022-12-21 DIAGNOSIS — R059 Cough, unspecified: Secondary | ICD-10-CM | POA: Diagnosis not present

## 2022-12-21 DIAGNOSIS — R0981 Nasal congestion: Secondary | ICD-10-CM | POA: Diagnosis not present

## 2022-12-22 DIAGNOSIS — R059 Cough, unspecified: Secondary | ICD-10-CM | POA: Diagnosis not present

## 2022-12-28 DIAGNOSIS — R7401 Elevation of levels of liver transaminase levels: Secondary | ICD-10-CM | POA: Diagnosis not present

## 2022-12-28 DIAGNOSIS — E8809 Other disorders of plasma-protein metabolism, not elsewhere classified: Secondary | ICD-10-CM | POA: Diagnosis not present

## 2023-01-03 DIAGNOSIS — R059 Cough, unspecified: Secondary | ICD-10-CM | POA: Diagnosis not present

## 2023-01-03 DIAGNOSIS — R093 Abnormal sputum: Secondary | ICD-10-CM | POA: Diagnosis not present

## 2023-01-04 ENCOUNTER — Other Ambulatory Visit: Payer: Medicare (Managed Care)

## 2023-01-05 DIAGNOSIS — R093 Abnormal sputum: Secondary | ICD-10-CM | POA: Diagnosis not present

## 2023-01-10 DIAGNOSIS — R4182 Altered mental status, unspecified: Secondary | ICD-10-CM | POA: Diagnosis not present

## 2023-01-10 DIAGNOSIS — R400 Somnolence: Secondary | ICD-10-CM | POA: Diagnosis not present

## 2023-01-17 DIAGNOSIS — N390 Urinary tract infection, site not specified: Secondary | ICD-10-CM | POA: Diagnosis not present

## 2023-01-17 DIAGNOSIS — R400 Somnolence: Secondary | ICD-10-CM | POA: Diagnosis not present

## 2023-01-17 DIAGNOSIS — R4182 Altered mental status, unspecified: Secondary | ICD-10-CM | POA: Diagnosis not present

## 2023-01-19 DIAGNOSIS — E559 Vitamin D deficiency, unspecified: Secondary | ICD-10-CM | POA: Diagnosis not present

## 2023-01-19 DIAGNOSIS — E785 Hyperlipidemia, unspecified: Secondary | ICD-10-CM | POA: Diagnosis not present

## 2023-01-19 DIAGNOSIS — R509 Fever, unspecified: Secondary | ICD-10-CM | POA: Diagnosis not present

## 2023-01-19 DIAGNOSIS — G629 Polyneuropathy, unspecified: Secondary | ICD-10-CM | POA: Diagnosis not present

## 2023-01-25 DIAGNOSIS — R5383 Other fatigue: Secondary | ICD-10-CM | POA: Diagnosis not present

## 2023-01-25 DIAGNOSIS — R4182 Altered mental status, unspecified: Secondary | ICD-10-CM | POA: Diagnosis not present

## 2023-01-26 DIAGNOSIS — R5383 Other fatigue: Secondary | ICD-10-CM | POA: Diagnosis not present

## 2023-01-26 DIAGNOSIS — F0390 Unspecified dementia without behavioral disturbance: Secondary | ICD-10-CM | POA: Diagnosis not present

## 2023-01-26 DIAGNOSIS — R4182 Altered mental status, unspecified: Secondary | ICD-10-CM | POA: Diagnosis not present

## 2023-01-26 DIAGNOSIS — R82998 Other abnormal findings in urine: Secondary | ICD-10-CM | POA: Diagnosis not present

## 2023-01-31 DIAGNOSIS — R112 Nausea with vomiting, unspecified: Secondary | ICD-10-CM | POA: Diagnosis not present

## 2023-02-01 DIAGNOSIS — R112 Nausea with vomiting, unspecified: Secondary | ICD-10-CM | POA: Diagnosis not present

## 2023-02-01 DIAGNOSIS — K5900 Constipation, unspecified: Secondary | ICD-10-CM | POA: Diagnosis not present

## 2023-02-02 DIAGNOSIS — K5900 Constipation, unspecified: Secondary | ICD-10-CM | POA: Diagnosis not present

## 2023-02-02 DIAGNOSIS — R112 Nausea with vomiting, unspecified: Secondary | ICD-10-CM | POA: Diagnosis not present

## 2023-02-03 DIAGNOSIS — E1159 Type 2 diabetes mellitus with other circulatory complications: Secondary | ICD-10-CM | POA: Diagnosis not present

## 2023-02-03 DIAGNOSIS — M2042 Other hammer toe(s) (acquired), left foot: Secondary | ICD-10-CM | POA: Diagnosis not present

## 2023-02-03 DIAGNOSIS — B351 Tinea unguium: Secondary | ICD-10-CM | POA: Diagnosis not present

## 2023-02-03 DIAGNOSIS — M2041 Other hammer toe(s) (acquired), right foot: Secondary | ICD-10-CM | POA: Diagnosis not present

## 2023-02-06 ENCOUNTER — Other Ambulatory Visit: Payer: Medicare (Managed Care)

## 2023-02-08 DIAGNOSIS — F03918 Unspecified dementia, unspecified severity, with other behavioral disturbance: Secondary | ICD-10-CM | POA: Diagnosis not present

## 2023-02-08 DIAGNOSIS — F419 Anxiety disorder, unspecified: Secondary | ICD-10-CM | POA: Diagnosis not present

## 2023-02-08 DIAGNOSIS — Z7189 Other specified counseling: Secondary | ICD-10-CM | POA: Diagnosis not present

## 2023-02-08 DIAGNOSIS — F32A Depression, unspecified: Secondary | ICD-10-CM | POA: Diagnosis not present

## 2023-02-09 DIAGNOSIS — E785 Hyperlipidemia, unspecified: Secondary | ICD-10-CM | POA: Diagnosis not present

## 2023-02-09 DIAGNOSIS — G4710 Hypersomnia, unspecified: Secondary | ICD-10-CM | POA: Diagnosis not present

## 2023-02-09 DIAGNOSIS — Z7901 Long term (current) use of anticoagulants: Secondary | ICD-10-CM | POA: Diagnosis not present

## 2023-02-09 DIAGNOSIS — I10 Essential (primary) hypertension: Secondary | ICD-10-CM | POA: Diagnosis not present

## 2023-02-09 DIAGNOSIS — K219 Gastro-esophageal reflux disease without esophagitis: Secondary | ICD-10-CM | POA: Diagnosis not present

## 2023-02-09 DIAGNOSIS — M6281 Muscle weakness (generalized): Secondary | ICD-10-CM | POA: Diagnosis not present

## 2023-02-09 DIAGNOSIS — L89323 Pressure ulcer of left buttock, stage 3: Secondary | ICD-10-CM | POA: Diagnosis not present

## 2023-02-09 DIAGNOSIS — L89151 Pressure ulcer of sacral region, stage 1: Secondary | ICD-10-CM | POA: Diagnosis not present

## 2023-02-09 DIAGNOSIS — E876 Hypokalemia: Secondary | ICD-10-CM | POA: Diagnosis not present

## 2023-02-09 DIAGNOSIS — Z741 Need for assistance with personal care: Secondary | ICD-10-CM | POA: Diagnosis not present

## 2023-02-09 DIAGNOSIS — F32A Depression, unspecified: Secondary | ICD-10-CM | POA: Diagnosis not present

## 2023-02-09 DIAGNOSIS — Z7409 Other reduced mobility: Secondary | ICD-10-CM | POA: Diagnosis not present

## 2023-02-09 DIAGNOSIS — F418 Other specified anxiety disorders: Secondary | ICD-10-CM | POA: Diagnosis not present

## 2023-02-09 DIAGNOSIS — M6289 Other specified disorders of muscle: Secondary | ICD-10-CM | POA: Diagnosis not present

## 2023-02-09 DIAGNOSIS — R262 Difficulty in walking, not elsewhere classified: Secondary | ICD-10-CM | POA: Diagnosis not present

## 2023-02-09 DIAGNOSIS — E119 Type 2 diabetes mellitus without complications: Secondary | ICD-10-CM | POA: Diagnosis not present

## 2023-02-13 DIAGNOSIS — I10 Essential (primary) hypertension: Secondary | ICD-10-CM | POA: Diagnosis not present

## 2023-02-13 DIAGNOSIS — Z7901 Long term (current) use of anticoagulants: Secondary | ICD-10-CM | POA: Diagnosis not present

## 2023-02-13 DIAGNOSIS — F32A Depression, unspecified: Secondary | ICD-10-CM | POA: Diagnosis not present

## 2023-02-13 DIAGNOSIS — G4710 Hypersomnia, unspecified: Secondary | ICD-10-CM | POA: Diagnosis not present

## 2023-02-14 ENCOUNTER — Ambulatory Visit (INDEPENDENT_AMBULATORY_CARE_PROVIDER_SITE_OTHER): Payer: Medicare (Managed Care)

## 2023-02-14 DIAGNOSIS — M2042 Other hammer toe(s) (acquired), left foot: Secondary | ICD-10-CM

## 2023-02-14 DIAGNOSIS — M2041 Other hammer toe(s) (acquired), right foot: Secondary | ICD-10-CM

## 2023-02-14 DIAGNOSIS — M2142 Flat foot [pes planus] (acquired), left foot: Secondary | ICD-10-CM

## 2023-02-14 DIAGNOSIS — E1142 Type 2 diabetes mellitus with diabetic polyneuropathy: Secondary | ICD-10-CM | POA: Diagnosis not present

## 2023-02-14 DIAGNOSIS — M2141 Flat foot [pes planus] (acquired), right foot: Secondary | ICD-10-CM

## 2023-02-14 DIAGNOSIS — I739 Peripheral vascular disease, unspecified: Secondary | ICD-10-CM

## 2023-02-14 NOTE — Progress Notes (Signed)
Patient presents today w/ wife and aid to pick up diabetic shoes and insoles.  Patient was dispensed 1 pair of diabetic shoes and 3 pairs of foam casted diabetic insoles. Fit was satisfactory. Instructions for break-in and wear was reviewed and a copy was given to the patient.   Re-appointment for regularly scheduled diabetic foot care visits or if they should experience any trouble with the shoes or insoles.  Addison Bailey Cped, CFo, CFm

## 2023-02-22 DIAGNOSIS — F32A Depression, unspecified: Secondary | ICD-10-CM | POA: Diagnosis not present

## 2023-02-22 DIAGNOSIS — F0390 Unspecified dementia without behavioral disturbance: Secondary | ICD-10-CM | POA: Diagnosis not present

## 2023-02-22 DIAGNOSIS — F419 Anxiety disorder, unspecified: Secondary | ICD-10-CM | POA: Diagnosis not present

## 2023-02-22 DIAGNOSIS — Z91148 Patient's other noncompliance with medication regimen for other reason: Secondary | ICD-10-CM | POA: Diagnosis not present

## 2023-03-10 DIAGNOSIS — M109 Gout, unspecified: Secondary | ICD-10-CM | POA: Diagnosis not present

## 2023-03-10 DIAGNOSIS — R918 Other nonspecific abnormal finding of lung field: Secondary | ICD-10-CM | POA: Diagnosis not present

## 2023-03-10 DIAGNOSIS — F419 Anxiety disorder, unspecified: Secondary | ICD-10-CM | POA: Diagnosis not present

## 2023-03-10 DIAGNOSIS — E872 Acidosis, unspecified: Secondary | ICD-10-CM | POA: Diagnosis not present

## 2023-03-10 DIAGNOSIS — D649 Anemia, unspecified: Secondary | ICD-10-CM | POA: Diagnosis not present

## 2023-03-10 DIAGNOSIS — U071 COVID-19: Secondary | ICD-10-CM | POA: Diagnosis not present

## 2023-03-10 DIAGNOSIS — I480 Paroxysmal atrial fibrillation: Secondary | ICD-10-CM | POA: Diagnosis not present

## 2023-03-10 DIAGNOSIS — I10 Essential (primary) hypertension: Secondary | ICD-10-CM | POA: Diagnosis not present

## 2023-03-10 DIAGNOSIS — N401 Enlarged prostate with lower urinary tract symptoms: Secondary | ICD-10-CM | POA: Diagnosis not present

## 2023-03-10 DIAGNOSIS — Z452 Encounter for adjustment and management of vascular access device: Secondary | ICD-10-CM | POA: Diagnosis not present

## 2023-03-10 DIAGNOSIS — R000 Tachycardia, unspecified: Secondary | ICD-10-CM | POA: Diagnosis not present

## 2023-03-10 DIAGNOSIS — R404 Transient alteration of awareness: Secondary | ICD-10-CM | POA: Diagnosis not present

## 2023-03-10 DIAGNOSIS — J189 Pneumonia, unspecified organism: Secondary | ICD-10-CM | POA: Diagnosis not present

## 2023-03-10 DIAGNOSIS — A419 Sepsis, unspecified organism: Secondary | ICD-10-CM | POA: Diagnosis not present

## 2023-03-10 DIAGNOSIS — I959 Hypotension, unspecified: Secondary | ICD-10-CM | POA: Diagnosis not present

## 2023-03-10 DIAGNOSIS — R0689 Other abnormalities of breathing: Secondary | ICD-10-CM | POA: Diagnosis not present

## 2023-03-10 DIAGNOSIS — N400 Enlarged prostate without lower urinary tract symptoms: Secondary | ICD-10-CM | POA: Diagnosis not present

## 2023-03-10 DIAGNOSIS — R9431 Abnormal electrocardiogram [ECG] [EKG]: Secondary | ICD-10-CM | POA: Diagnosis not present

## 2023-03-10 DIAGNOSIS — Z6828 Body mass index (BMI) 28.0-28.9, adult: Secondary | ICD-10-CM | POA: Diagnosis not present

## 2023-03-10 DIAGNOSIS — E7800 Pure hypercholesterolemia, unspecified: Secondary | ICD-10-CM | POA: Diagnosis not present

## 2023-03-10 DIAGNOSIS — F0390 Unspecified dementia without behavioral disturbance: Secondary | ICD-10-CM | POA: Diagnosis not present

## 2023-03-10 DIAGNOSIS — M1990 Unspecified osteoarthritis, unspecified site: Secondary | ICD-10-CM | POA: Diagnosis not present

## 2023-03-10 DIAGNOSIS — R627 Adult failure to thrive: Secondary | ICD-10-CM | POA: Diagnosis not present

## 2023-03-10 DIAGNOSIS — R4182 Altered mental status, unspecified: Secondary | ICD-10-CM | POA: Diagnosis not present

## 2023-03-10 DIAGNOSIS — I444 Left anterior fascicular block: Secondary | ICD-10-CM | POA: Diagnosis not present

## 2023-03-10 DIAGNOSIS — G928 Other toxic encephalopathy: Secondary | ICD-10-CM | POA: Diagnosis not present

## 2023-03-10 DIAGNOSIS — E119 Type 2 diabetes mellitus without complications: Secondary | ICD-10-CM | POA: Diagnosis not present

## 2023-03-10 DIAGNOSIS — E869 Volume depletion, unspecified: Secondary | ICD-10-CM | POA: Diagnosis not present

## 2023-03-10 DIAGNOSIS — K219 Gastro-esophageal reflux disease without esophagitis: Secondary | ICD-10-CM | POA: Diagnosis not present

## 2023-03-10 DIAGNOSIS — J690 Pneumonitis due to inhalation of food and vomit: Secondary | ICD-10-CM | POA: Diagnosis not present

## 2023-03-10 DIAGNOSIS — R55 Syncope and collapse: Secondary | ICD-10-CM | POA: Diagnosis not present

## 2023-03-10 DIAGNOSIS — G9341 Metabolic encephalopathy: Secondary | ICD-10-CM | POA: Diagnosis not present

## 2023-05-12 ENCOUNTER — Ambulatory Visit: Payer: Medicare (Managed Care) | Admitting: Gastroenterology
